# Patient Record
Sex: Male | Born: 1945 | Race: White | Hispanic: No | Marital: Married | Smoking: Former smoker
Health system: Southern US, Community
[De-identification: ages and names within clinical notes are randomized; demographics above are authoritative.]

## PROBLEM LIST (undated history)

## (undated) DIAGNOSIS — I255 Ischemic cardiomyopathy: Secondary | ICD-10-CM

## (undated) DIAGNOSIS — I219 Acute myocardial infarction, unspecified: Secondary | ICD-10-CM

## (undated) DIAGNOSIS — G9341 Metabolic encephalopathy: Secondary | ICD-10-CM

## (undated) DIAGNOSIS — S82899A Other fracture of unspecified lower leg, initial encounter for closed fracture: Secondary | ICD-10-CM

## (undated) DIAGNOSIS — E785 Hyperlipidemia, unspecified: Secondary | ICD-10-CM

## (undated) DIAGNOSIS — F0390 Unspecified dementia without behavioral disturbance: Secondary | ICD-10-CM

## (undated) DIAGNOSIS — I214 Non-ST elevation (NSTEMI) myocardial infarction: Secondary | ICD-10-CM

## (undated) DIAGNOSIS — I4729 Other ventricular tachycardia: Secondary | ICD-10-CM

## (undated) DIAGNOSIS — I2510 Atherosclerotic heart disease of native coronary artery without angina pectoris: Secondary | ICD-10-CM

## (undated) DIAGNOSIS — I472 Ventricular tachycardia: Secondary | ICD-10-CM

## (undated) DIAGNOSIS — H269 Unspecified cataract: Secondary | ICD-10-CM

## (undated) DIAGNOSIS — N200 Calculus of kidney: Secondary | ICD-10-CM

## (undated) DIAGNOSIS — I10 Essential (primary) hypertension: Secondary | ICD-10-CM

## (undated) HISTORY — PX: CATARACT EXTRACTION: SUR2

## (undated) HISTORY — PX: CARDIAC CATHETERIZATION: SHX172

## (undated) HISTORY — PX: VASECTOMY: SHX75

## (undated) HISTORY — PX: NASAL SEPTUM SURGERY: SHX37

## (undated) HISTORY — DX: Acute myocardial infarction, unspecified: I21.9

## (undated) HISTORY — DX: Hyperlipidemia, unspecified: E78.5

## (undated) HISTORY — DX: Other fracture of unspecified lower leg, initial encounter for closed fracture: S82.899A

## (undated) HISTORY — PX: ANKLE FRACTURE SURGERY: SHX122

## (undated) HISTORY — DX: Unspecified cataract: H26.9

## (undated) HISTORY — PX: CORONARY STENT PLACEMENT: SHX1402

---

## 2003-10-22 ENCOUNTER — Ambulatory Visit (HOSPITAL_COMMUNITY): Admission: RE | Admit: 2003-10-22 | Discharge: 2003-10-22 | Payer: Self-pay | Admitting: Internal Medicine

## 2012-10-08 ENCOUNTER — Encounter: Payer: Self-pay | Admitting: Gastroenterology

## 2012-11-05 ENCOUNTER — Encounter: Payer: Self-pay | Admitting: Gastroenterology

## 2012-11-05 ENCOUNTER — Telehealth: Payer: Self-pay | Admitting: *Deleted

## 2012-11-05 ENCOUNTER — Ambulatory Visit (AMBULATORY_SURGERY_CENTER): Payer: Medicare Other | Admitting: *Deleted

## 2012-11-05 VITALS — Ht 69.0 in | Wt 194.0 lb

## 2012-11-05 DIAGNOSIS — Z1211 Encounter for screening for malignant neoplasm of colon: Secondary | ICD-10-CM

## 2012-11-05 MED ORDER — NA SULFATE-K SULFATE-MG SULF 17.5-3.13-1.6 GM/177ML PO SOLN: ORAL | Status: DC

## 2012-11-05 NOTE — Progress Notes (Signed)
Patient states last colonoscopy was at [facility] by Dr.Robert Rourk in 2005. Release of information filled out and signed by patient. Given to [employer]. Phone noted sent also. Thanks!Robbin Myers,RN

## 2012-11-05 NOTE — Telephone Encounter (Signed)
Patient is for direct screening colonoscopy on 11-29-12 by Dr.Kaplan. Patient had last colonoscopy by Dr.Robert Rourk at [facility]. He said it was a "normal" exam. Patient said Dr.Donald Christell Constant has his records. Release of information filled out and signed and given to [employer].

## 2012-11-08 NOTE — Telephone Encounter (Signed)
Faxed release today 

## 2012-11-19 ENCOUNTER — Encounter: Payer: Self-pay | Admitting: Gastroenterology

## 2012-11-29 ENCOUNTER — Ambulatory Visit (AMBULATORY_SURGERY_CENTER): Payer: Medicare Other | Admitting: Gastroenterology

## 2012-11-29 ENCOUNTER — Encounter: Payer: Self-pay | Admitting: Gastroenterology

## 2012-11-29 VITALS — BP 135/71 | HR 61 | Temp 97.4°F | Resp 17 | Ht 69.0 in | Wt 194.0 lb

## 2012-11-29 DIAGNOSIS — Z1211 Encounter for screening for malignant neoplasm of colon: Secondary | ICD-10-CM

## 2012-11-29 MED ORDER — SODIUM CHLORIDE 0.9 % IV SOLN: 500.0000 mL | INTRAVENOUS | Status: DC

## 2012-11-29 NOTE — Op Note (Signed)
[facility] [address] [place], [address]   COLONOSCOPY PROCEDURE REPORT  PATIENT: Adam Rowland, Adam Rowland  MR#: [id number] BIRTHDATE: [birth year] , 67  yrs. old GENDER: Male ENDOSCOPIST: Louis Meckel, MD REFERRED OA:CZYS Sheron Nightingale, N.P. PROCEDURE DATE:  11/29/2012 PROCEDURE:   Colonoscopy, diagnostic ASA CLASS:   Class I INDICATIONS:average risk screening. MEDICATIONS: MAC sedation, administered by CRNA and propofol (Diprivan) 300mg  IV  DESCRIPTION OF PROCEDURE:   After the risks benefits and alternatives of the procedure were thoroughly explained, informed consent was obtained.  A digital rectal exam revealed no abnormalities of the rectum.   The LB [id number]  endoscope was introduced through the anus and advanced to the cecum, which was identified by both the appendix and ileocecal valve. No adverse events experienced.   The quality of the prep was Suprep good  The instrument was then slowly withdrawn as the colon was fully examined.      COLON FINDINGS: A normal appearing cecum, ileocecal valve, and appendiceal orifice were identified.  The ascending, hepatic flexure, transverse, splenic flexure, descending, sigmoid colon and rectum appeared unremarkable.  No polyps or cancers were seen. Retroflexed views revealed no abnormalities. The time to cecum=2 minutes 34 seconds.  Withdrawal time=6 minutes 14 seconds.  The scope was withdrawn and the procedure completed. COMPLICATIONS: There were no complications.  ENDOSCOPIC IMPRESSION: Normal colon  RECOMMENDATIONS: Continue current colorectal screening recommendations for "routine risk" patients with a repeat colonoscopy in 10 years.   eSigned:  Louis Meckel, MD 11/29/2012 8:49 AM   cc:

## 2012-11-29 NOTE — Patient Instructions (Addendum)
YOU HAD AN ENDOSCOPIC PROCEDURE TODAY AT THE [facility]: Refer to the procedure report that was given to you for any specific questions about what was found during the examination.  If the procedure report does not answer your questions, please call your gastroenterologist to clarify.  If you requested that your care partner not be given the details of your procedure findings, then the procedure report has been included in a sealed envelope for you to review at your convenience later.  YOU SHOULD EXPECT: Some feelings of bloating in the abdomen. Passage of more gas than usual.  Walking can help get rid of the air that was put into your GI tract during the procedure and reduce the bloating. If you had a lower endoscopy (such as a colonoscopy or flexible sigmoidoscopy) you may notice spotting of blood in your stool or on the toilet paper. If you underwent a bowel prep for your procedure, then you may not have a normal bowel movement for a few days.  DIET: Your first meal following the procedure should be a light meal and then it is ok to progress to your normal diet.  A half-sandwich or bowl of soup is an example of a good first meal.  Heavy or fried foods are harder to digest and may make you feel nauseous or bloated.  Likewise meals heavy in dairy and vegetables can cause extra gas to form and this can also increase the bloating.  Drink plenty of fluids but you should avoid alcoholic beverages for 24 hours.  ACTIVITY: Your care partner should take you home directly after the procedure.  You should plan to take it easy, moving slowly for the rest of the day.  You can resume normal activity the day after the procedure however you should NOT DRIVE or use heavy machinery for 24 hours (because of the sedation medicines used during the test).    SYMPTOMS TO REPORT IMMEDIATELY: A gastroenterologist can be reached at any hour.  During normal business hours, 8:30 AM to 5:00 PM Monday through Friday,  call [phone].  After hours and on weekends, please call the GI answering service at [phone] who will take a message and have the physician on call contact you.   Following lower endoscopy (colonoscopy or flexible sigmoidoscopy):  Excessive amounts of blood in the stool  Significant tenderness or worsening of abdominal pains  Swelling of the abdomen that is new, acute  Fever of 100F or higher    FOLLOW UP: If any biopsies were taken you will be contacted by phone or by letter within the next 1-3 weeks.  Call your gastroenterologist if you have not heard about the biopsies in 3 weeks.  Our staff will call the home number listed on your records the next business day following your procedure to check on you and address any questions or concerns that you may have at that time regarding the information given to you following your procedure. This is a courtesy call and so if there is no answer at the home number and we have not heard from you through the emergency physician on call, we will assume that you have returned to your regular daily activities without incident.  SIGNATURES/CONFIDENTIALITY: You and/or your care partner have signed paperwork which will be entered into your electronic medical record.  These signatures attest to the fact that that the information above on your After Visit Summary has been reviewed and is understood.  Full responsibility of the confidentiality   of this discharge information lies with you and/or your care-partner.     

## 2012-11-29 NOTE — Progress Notes (Signed)
Patient did not experience any of the following events: a burn prior to discharge; a fall within the facility; wrong site/side/patient/procedure/implant event; or a hospital transfer or hospital admission upon discharge from the facility. (G8907) Patient did not have preoperative order for IV antibiotic SSI prophylaxis. (G8918)  

## 2012-11-29 NOTE — Progress Notes (Signed)
To RR stable 

## 2012-11-30 ENCOUNTER — Telehealth: Payer: Self-pay | Admitting: *Deleted

## 2012-11-30 NOTE — Telephone Encounter (Signed)
  Follow up Call-  Call back number 11/29/2012  Post procedure Call Back phone  # [phone]  Permission to leave phone message Yes     Patient questions:  Do you have a fever, pain , or abdominal swelling? no Pain Score  0 *  Have you tolerated food without any problems? yes  Have you been able to return to your normal activities? yes  Do you have any questions about your discharge instructions: Diet   no Medications  no Follow up visit  no  Do you have questions or concerns about your Care? no  Actions: * If pain score is 4 or above: No action needed, pain <4.

## 2012-12-03 NOTE — Telephone Encounter (Signed)
Never received records... Procedure preformed

## 2012-12-12 ENCOUNTER — Ambulatory Visit (INDEPENDENT_AMBULATORY_CARE_PROVIDER_SITE_OTHER): Payer: Medicare Other | Admitting: *Deleted

## 2012-12-12 DIAGNOSIS — Z23 Encounter for immunization: Secondary | ICD-10-CM

## 2012-12-12 NOTE — Patient Instructions (Signed)
Tetanus, Diphtheria, Pertussis (Tdap) Vaccine What You Need to Know WHY GET VACCINATED? Tetanus, diphtheria and pertussis can be very serious diseases, even for adolescents and adults. Tdap vaccine can protect us from these diseases. TETANUS (Lockjaw) causes painful muscle tightening and stiffness, usually all over the body.  It can lead to tightening of muscles in the head and neck so you can't open your mouth, swallow, or sometimes even breathe. Tetanus kills about 1 out of 5 people who are infected. DIPHTHERIA can cause a thick coating to form in the back of the throat.  It can lead to breathing problems, paralysis, heart failure, and death. PERTUSSIS (Whooping Cough) causes severe coughing spells, which can cause difficulty breathing, vomiting and disturbed sleep.  It can also lead to weight loss, incontinence, and rib fractures. Up to 2 in 100 adolescents and 5 in 100 adults with pertussis are hospitalized or have complications, which could include pneumonia and death. These diseases are caused by bacteria. Diphtheria and pertussis are spread from person to person through coughing or sneezing. Tetanus enters the body through cuts, scratches, or wounds. Before vaccines, the [place] saw as many as 200,000 cases a year of diphtheria and pertussis, and hundreds of cases of tetanus. Since vaccination began, tetanus and diphtheria have dropped by about 99% and pertussis by about 80%. TDAP VACCINE Tdap vaccine can protect adolescents and adults from tetanus, diphtheria, and pertussis. One dose of Tdap is routinely given at age 11 or 12. People who did not get Tdap at that age should get it as soon as possible. Tdap is especially important for health care professionals and anyone having close contact with a baby younger than 12 months. Pregnant women should get a dose of Tdap during every pregnancy, to protect the newborn from pertussis. Infants are most at risk for severe, life-threatening  complications from pertussis. A similar vaccine, called Td, protects from tetanus and diphtheria, but not pertussis. A Td booster should be given every 10 years. Tdap may be given as one of these boosters if you have not already gotten a dose. Tdap may also be given after a severe cut or burn to prevent tetanus infection. Your doctor can give you more information. Tdap may safely be given at the same time as other vaccines. SOME PEOPLE SHOULD NOT GET THIS VACCINE  If you ever had a life-threatening allergic reaction after a dose of any tetanus, diphtheria, or pertussis containing vaccine, OR if you have a severe allergy to any part of this vaccine, you should not get Tdap. Tell your doctor if you have any severe allergies.  If you had a coma, or long or multiple seizures within 7 days after a childhood dose of DTP or DTaP, you should not get Tdap, unless a cause other than the vaccine was found. You can still get Td.  Talk to your doctor if you:  have epilepsy or another nervous system problem,  had severe pain or swelling after any vaccine containing diphtheria, tetanus or pertussis,  ever had Guillain-Barr Syndrome (GBS),  aren't feeling well on the day the shot is scheduled. RISKS OF A VACCINE REACTION With any medicine, including vaccines, there is a chance of side effects. These are usually mild and go away on their own, but serious reactions are also possible. Brief fainting spells can follow a vaccination, leading to injuries from falling. Sitting or lying down for about 15 minutes can help prevent these. Tell your doctor if you feel dizzy or light-headed, or   have vision changes or ringing in the ears. Mild problems following Tdap (Did not interfere with activities)  Pain where the shot was given (about 3 in 4 adolescents or 2 in 3 adults)  Redness or swelling where the shot was given (about 1 person in 5)  Mild fever of at least 100.16F (up to about 1 in 25 adolescents or 1 in  100 adults)  Headache (about 3 or 4 people in 10)  Tiredness (about 1 person in 3 or 4)  Nausea, vomiting, diarrhea, stomach ache (up to 1 in 4 adolescents or 1 in 10 adults)  Chills, body aches, sore joints, rash, swollen glands (uncommon) Moderate problems following Tdap (Interfered with activities, but did not require medical attention)  Pain where the shot was given (about 1 in 5 adolescents or 1 in 100 adults)  Redness or swelling where the shot was given (up to about 1 in 16 adolescents or 1 in 25 adults)  Fever over 102F (about 1 in 100 adolescents or 1 in 250 adults)  Headache (about 3 in 20 adolescents or 1 in 10 adults)  Nausea, vomiting, diarrhea, stomach ache (up to 1 or 3 people in 100)  Swelling of the entire arm where the shot was given (up to about 3 in 100). Severe problems following Tdap (Unable to perform usual activities, required medical attention)  Swelling, severe pain, bleeding and redness in the arm where the shot was given (rare). A severe allergic reaction could occur after any vaccine (estimated less than 1 in a million doses). WHAT IF THERE IS A SERIOUS REACTION? What should I look for?  Look for anything that concerns you, such as signs of a severe allergic reaction, very high fever, or behavior changes. Signs of a severe allergic reaction can include hives, swelling of the face and throat, difficulty breathing, a fast heartbeat, dizziness, and weakness. These would start a few minutes to a few hours after the vaccination. What should I do?  If you think it is a severe allergic reaction or other emergency that can't wait, call 9-1-1 or get the person to the nearest hospital. Otherwise, call your doctor.  Afterward, the reaction should be reported to the "Vaccine Adverse Event Reporting System" (VAERS). Your doctor might file this report, or you can do it yourself through the VAERS web site at www.vaers.LAgents.no, or by calling 1-747-061-3494. VAERS is  only for reporting reactions. They do not give medical advice.  THE NATIONAL VACCINE INJURY COMPENSATION PROGRAM The National Vaccine Injury Compensation Program (VICP) is a federal program that was created to compensate people who may have been injured by certain vaccines. Persons who believe they may have been injured by a vaccine can learn about the program and about filing a claim by calling 1-(878) 582-8087 or visiting the VICP website at SpiritualWord.at. HOW CAN I LEARN MORE?  Ask your doctor.  Call your local or state health department.  Contact the Centers for Disease Control and Prevention (CDC):  Call 8620531230 or visit CDC's website at PicCapture.uy CDC Tdap Vaccine VIS (01/05/12) Document Released: 02/14/2012 Document Reviewed: 02/14/2012 [facility] Patient Information 2013 [place], [place]. Pneumococcal Polysaccharide Vaccine What You Need to Know PNEUMOCOCCAL DISEASE Pneumococcal disease is caused by Streptococcus pneumoniae bacteria. It is a leading cause of vaccine-preventable illness and death in the [place]. Anyone can get pneumococcal disease, but some people are at greater risk than others:  People 93 and older.  The very young.  People with certain health problems.  People with  a weakened immune system.  Smokers. Pneumococcal disease can lead to serious infections of the:  Lungs (pneumonia).  Blood (bacteremia).  Covering of the brain (meningitis). Pneumococcal pneumonia kills about 1 out of 20 people who get it. Bacteremia kills about 1 person in 5, and meningitis about 3 people in 10.  PNEUMOCOCCAL POLYSACCHARIDE VACCINE (PPSV) Treatment of pneumococcal infections with penicillin and other drugs used to be more effective. However, some strains of the disease have become resistant to these drugs. This makes prevention of the disease, through vaccination, even more important. Pneumococcal polysaccharide vaccine (PPSV) protects  against 23 types of pneumococcal bacteria, including those most likely to cause serious disease. Most healthy adults who get the vaccine develop protection to most or all of these types within 2 to 3 weeks of getting the shot. Very old people, children under 32 years of age, and people with some long-term illnesses might not respond as well, or at all. Another type of pneumococcal vaccine (pneumococcal conjugate vaccine, or PCV) is routinely recommended for children younger than 90 years of age.  WHO SHOULD GET PPSV?  All adults 37 years of age or older.  Anyone 2 through 66 years of age who has a long-term health problem, such as:  Heart disease.  Lung disease.  Sickle cell disease.  Diabetes.  Alcoholism.  Cirrhosis.  Leaks of cerebrospinal fluid.  Cochlear implant.  Anyone 2 through 66 years of age who has a disease or condition that lowers the body's resistance to infection, such as:  Hodgkin's disease.  Lymphoma or leukemia.  Kidney failure.  Multiple myeloma.  Nephrotic syndrome.  HIV infection or AIDS.  Damaged spleen or no spleen.  Organ transplant.  Anyone 2 through 66 years of age who is taking a drug or treatment that lowers the body's resistance to infection, such as:  Long-term steroids.  Certain cancer drugs.  Radiation therapy.  Any adult 71 through 66 years of age who:  Is a smoker.  Has asthma. PPSV may be less effective for some people, especially those with lower resistance to infection. However, these people should still be vaccinated because they are more likely to have serious complications if they get pneumococcal disease. Children who often get ear infections, sinus infections, or other upper respiratory diseases, but who are otherwise healthy, do not need to get PPSV because it is not effective against those conditions. HOW MANY DOSES OF PPSV ARE NEEDED, AND WHEN? Usually only 1 dose of PPSV is needed, but under some circumstances a  second dose may be given.  A second dose is recommended for people age 64 and older who got their first dose when they were younger than 80 if 5 or more years have passed since the first dose.  A second dose is recommended for people 2 through 66 years of age who:  Have a damaged spleen or no spleen.  Have sickle cell disease.  Have HIV infection or AIDS.  Have cancer, leukemia, lymphoma, or multiple myeloma.  Have nephrotic syndrome.  Have had an organ or bone marrow transplant.  Are taking medicine that lowers immunity (such as chemotherapy or long-term steroids). When a second dose is given, it should be given 5 years after the first dose. SOME PEOPLE SHOULD NOT GET PPSV OR SHOULD WAIT  Anyone who has had a life-threatening allergic reaction to PPSV should not get another dose.  Anyone who has a severe allergy to any component of a vaccine should not get that vaccine. Tell  your caregiver if you have any severe allergies.  Anyone who is moderately or severely ill when the shot is scheduled may be asked to wait until they recover before getting the vaccine. Someone with a mild illness can usually be vaccinated.  While there is no evidence that PPSV is harmful to either a pregnant woman or to her fetus, as a precaution, women with conditions that put them at risk for pneumococcal disease should be vaccinated before becoming pregnant, if possible. WHAT ARE THE RISKS FROM PPSV?  About half of the people who get PPSV have mild side effects, such as redness or pain where the shot is given.  Less than 1% develop a fever, muscle aches, or more severe local reactions.  A vaccine, like any medicine, can cause a serious reaction. However, the risk of a vaccine causing serious harm, or death, is extremely small. WHAT IF THERE IS A SEVERE REACTION? What should I look for? Any unusual condition, such as a high fever or behavior changes. Signs of a severe allergic reaction can include  difficulty breathing, hoarseness or wheezing, hives, paleness, weakness, a fast heartbeat, or dizziness. What should I do?  Call a caregiver, or get the person to a caregiver right away.  Tell your caregiver what happened, the date and time it happened, and when the vaccination was given.  Ask your caregiver to report the reaction by filing a Vaccine Adverse Event Reporting System (VAERS) form. Or, you can file this report through the VAERS website at www.vaers.LAgents.no or by calling 1-(601)180-8129. VAERS does not provide medical advice. HOW CAN I LEARN MORE?  Ask your caregiver. He or she can give you the vaccine package insert or suggest other sources of information.  Call your local or state health department.  Contact the Centers for Disease Control and Prevention (CDC):  Call 802 123 6302 (1-800-CDC-INFO).  Visit the CDC website at PicCapture.uy CDC Pneumococcal Polysaccharide Vaccine VIS (06/03/08) Document Released: 06/12/2006 Document Revised: 11/07/2011 Document Reviewed: 06/03/2008 [facility] Patient Information 2013 [place], [place].

## 2013-01-16 ENCOUNTER — Encounter: Payer: Self-pay | Admitting: Nurse Practitioner

## 2013-01-16 ENCOUNTER — Ambulatory Visit (INDEPENDENT_AMBULATORY_CARE_PROVIDER_SITE_OTHER): Payer: Medicare Other | Admitting: Nurse Practitioner

## 2013-01-16 ENCOUNTER — Other Ambulatory Visit: Payer: Medicare Other

## 2013-01-16 VITALS — BP 152/88 | HR 61 | Temp 97.4°F | Ht 69.0 in | Wt 184.0 lb

## 2013-01-16 DIAGNOSIS — N200 Calculus of kidney: Secondary | ICD-10-CM

## 2013-01-16 DIAGNOSIS — I10 Essential (primary) hypertension: Secondary | ICD-10-CM

## 2013-01-16 DIAGNOSIS — E785 Hyperlipidemia, unspecified: Secondary | ICD-10-CM

## 2013-01-16 HISTORY — DX: Calculus of kidney: N20.0

## 2013-01-16 LAB — COMPLETE METABOLIC PANEL WITH GFR
Albumin: 4.5 g/dL (ref 3.5–5.2)
Alkaline Phosphatase: 45 U/L (ref 39–117)
BUN: 12 mg/dL (ref 6–23)
CO2: 26 mEq/L (ref 19–32)
GFR, Est African American: >89 mL/min
GFR, Est Non African American: >89 mL/min
Glucose, Bld: 120 mg/dL — ABNORMAL HIGH (ref 70–99)
Total Bilirubin: 1 mg/dL (ref 0.3–1.2)

## 2013-01-16 MED ORDER — LISINOPRIL 20 MG PO TABS: 20.0000 mg | ORAL_TABLET | Freq: Every day | ORAL | Status: DC

## 2013-01-16 NOTE — Patient Instructions (Signed)

## 2013-01-16 NOTE — Progress Notes (Signed)
  Subjective:    Patient ID: Adam Rowland, male    DOB: 10/28/1945, 67 y.o.   MRN: [id number]  Hyperlipidemia This is a chronic problem. The current episode started more than 1 year ago. The problem is uncontrolled. Recent lipid tests were reviewed and are high. Exacerbating diseases include hypothyroidism and obesity. There are no known factors aggravating his hyperlipidemia. Pertinent negatives include no chest pain, focal sensory loss, leg pain, myalgias or shortness of breath. Current antihyperlipidemic treatment includes diet change. Improvement on treatment: will see if diet has helped when we bet labs  back. Compliance problems include adherence to exercise.  Risk factors for coronary artery disease include male sex.  Hypertension Blood pressure high today- Was high the last 3 times he was here. Wanted to try diet and exercise before going on meds. Has done well on diet and has actually lost 12lbs since last visit and has made no change in Blood Pressure.    Review of Systems  Constitutional: Positive for diaphoresis. Negative for fatigue.  HENT: Negative.   Eyes: Negative.   Respiratory: Negative for chest tightness and shortness of breath.   Cardiovascular: Negative for chest pain and palpitations.  Endocrine: Negative.   Genitourinary: Negative.   Musculoskeletal: Negative.  Negative for myalgias.  Neurological: Negative.   Hematological: Negative.   Psychiatric/Behavioral: Negative.        Objective:   Physical Exam  Constitutional: He is oriented to person, place, and time. He appears well-developed and well-nourished.  HENT:  Head: Normocephalic.  Right Ear: External ear normal.  Left Ear: External ear normal.  Nose: Nose normal.  Mouth/Throat: Oropharynx is clear and moist.  Eyes: EOM are normal. Pupils are equal, round, and reactive to light.  Neck: Normal range of motion. Neck supple. No thyromegaly present.  Cardiovascular: Normal rate, regular rhythm, normal heart  sounds and intact distal pulses.   No murmur heard. Pulmonary/Chest: Effort normal and breath sounds normal. He has no wheezes. He has no rales.  Abdominal: Soft. Bowel sounds are normal.  Genitourinary:  Rectal exam done at last visit.  Musculoskeletal: Normal range of motion.  Neurological: He is alert and oriented to person, place, and time.  Skin: Skin is warm and dry.  Psychiatric: He has a normal mood and affect. His behavior is normal. Judgment and thought content normal.   BP 152/88  Pulse 61  Temp(Src) 97.4 F (36.3 C) (Oral)  Ht 5\' 9"  (1.753 m)  Wt 184 lb (83.462 kg)  BMI 27.16 kg/m2        Assessment & Plan:  1. Other and unspecified hyperlipidemia Continue low fat diet an dexercsie - COMPLETE METABOLIC PANEL WITH GFR - NMR Lipoprofile with Lipids  2. Nephrolithiasis Force fluids Decrease caffeine consumption  3. Hypertension Low Na+ diet Meds ordered this encounter  Medications  . lisinopril (PRINIVIL,ZESTRIL) 20 MG tablet    Sig: Take 1 tablet (20 mg total) by mouth daily.    Dispense:  30 tablet    Refill:  5    Order Specific Question:  Supervising Provider    Answer:  Ernestina Penna [[id number]]   [name] Daphine Deutscher, FNP

## 2013-01-18 LAB — NMR LIPOPROFILE WITH LIPIDS
HDL Size: 8 nm — ABNORMAL LOW (ref >=9.2)
LDL Particle Number: 1999 nmol/L — ABNORMAL HIGH (ref <1000)
LDL Size: 19.9 nm — ABNORMAL LOW (ref >20.5)
Large VLDL-P: 1.5 nmol/L (ref <=2.7)
Triglycerides: 176 mg/dL — ABNORMAL HIGH (ref <150)
VLDL Size: 47 nm — ABNORMAL HIGH (ref <=46.6)

## 2013-01-22 ENCOUNTER — Other Ambulatory Visit: Payer: Self-pay | Admitting: Nurse Practitioner

## 2013-01-22 MED ORDER — ATORVASTATIN CALCIUM 40 MG PO TABS: 40.0000 mg | ORAL_TABLET | Freq: Every day | ORAL | Status: DC

## 2013-01-28 NOTE — Progress Notes (Signed)
Lmom.  Call us please.

## 2013-01-29 ENCOUNTER — Telehealth: Payer: Self-pay | Admitting: Family Medicine

## 2013-07-04 ENCOUNTER — Other Ambulatory Visit: Payer: Self-pay

## 2013-08-02 ENCOUNTER — Other Ambulatory Visit: Payer: Self-pay | Admitting: Nurse Practitioner

## 2013-08-05 NOTE — Telephone Encounter (Signed)
Last seen 01/16/13  MMM   

## 2013-08-29 DIAGNOSIS — I219 Acute myocardial infarction, unspecified: Secondary | ICD-10-CM

## 2013-08-29 HISTORY — DX: Acute myocardial infarction, unspecified: I21.9

## 2013-09-09 ENCOUNTER — Other Ambulatory Visit: Payer: Self-pay | Admitting: Nurse Practitioner

## 2013-09-11 NOTE — Telephone Encounter (Signed)
ntbs for future refills of blood pressure meds

## 2013-09-11 NOTE — Telephone Encounter (Signed)
Last seen 01/16/13  MMM

## 2013-10-14 ENCOUNTER — Other Ambulatory Visit: Payer: Self-pay | Admitting: Nurse Practitioner

## 2013-10-16 NOTE — Telephone Encounter (Signed)
Last seen 01/16/13

## 2013-10-17 NOTE — Telephone Encounter (Signed)
Refill sent to pharmacy- NTBS

## 2013-11-19 ENCOUNTER — Other Ambulatory Visit: Payer: Self-pay | Admitting: Nurse Practitioner

## 2013-12-26 ENCOUNTER — Other Ambulatory Visit: Payer: Self-pay | Admitting: Nurse Practitioner

## 2013-12-27 NOTE — Telephone Encounter (Signed)
Patient notified at last refill that NTBS. Please advise. Last visit was 01-16-13

## 2013-12-28 NOTE — Telephone Encounter (Signed)
NO MORE REFILLS without being seeen

## 2013-12-28 NOTE — Telephone Encounter (Signed)
Detailed message left that he would need to be seen for any further refills

## 2013-12-30 ENCOUNTER — Other Ambulatory Visit: Payer: Self-pay | Admitting: *Deleted

## 2013-12-30 MED ORDER — LISINOPRIL 20 MG PO TABS: ORAL_TABLET | ORAL | Status: DC

## 2014-01-13 ENCOUNTER — Telehealth: Payer: Self-pay | Admitting: *Deleted

## 2014-01-13 NOTE — Telephone Encounter (Signed)
Pt called stating he had a TCS 13 months ago, [facility] scheduled him, pt had his TCS done by Melvia Heaps. Please advise [phone]

## 2014-01-13 NOTE — Telephone Encounter (Signed)
Tried to call but no answer

## 2014-01-16 ENCOUNTER — Encounter: Payer: Self-pay | Admitting: Nurse Practitioner

## 2014-01-16 ENCOUNTER — Ambulatory Visit (INDEPENDENT_AMBULATORY_CARE_PROVIDER_SITE_OTHER): Payer: Medicare Other | Admitting: Nurse Practitioner

## 2014-01-16 VITALS — BP 133/72 | HR 59 | Temp 98.2°F | Ht 69.0 in | Wt 184.0 lb

## 2014-01-16 DIAGNOSIS — I10 Essential (primary) hypertension: Secondary | ICD-10-CM

## 2014-01-16 DIAGNOSIS — Z125 Encounter for screening for malignant neoplasm of prostate: Secondary | ICD-10-CM

## 2014-01-16 DIAGNOSIS — E785 Hyperlipidemia, unspecified: Secondary | ICD-10-CM

## 2014-01-16 DIAGNOSIS — Z0000 Encounter for general adult medical examination without abnormal findings: Secondary | ICD-10-CM

## 2014-01-16 LAB — POCT CBC
GRANULOCYTE PERCENT: 62.5 % (ref 37–80)
HCT, POC: 45.6 % (ref 43.5–53.7)
Hemoglobin: 15.1 g/dL (ref 14.1–18.1)
LYMPH, POC: 1.9 (ref 0.6–3.4)
MCH, POC: 32.3 pg — AB (ref 27–31.2)
MCHC: 33.1 g/dL (ref 31.8–35.4)
MCV: 97.9 fL — AB (ref 80–97)
MPV: 6.4 fL (ref 0–99.8)
PLATELET COUNT, POC: 270 10*3/uL (ref 142–424)
POC GRANULOCYTE: 3.7 (ref 2–6.9)
POC LYMPH PERCENT: 32.1 %L (ref 10–50)
RBC: 4.7 M/uL (ref 4.69–6.13)
RDW, POC: 12.2 %
WBC: 5.9 10*3/uL (ref 4.6–10.2)

## 2014-01-16 MED ORDER — LISINOPRIL 20 MG PO TABS: ORAL_TABLET | ORAL | Status: DC

## 2014-01-16 MED ORDER — ATORVASTATIN CALCIUM 40 MG PO TABS: 40.0000 mg | ORAL_TABLET | Freq: Every day | ORAL | Status: DC

## 2014-01-16 NOTE — Progress Notes (Signed)
Subjective:    Patient ID: Adam Rowland, male    DOB: [birth year], 68 y.o.   MRN: [id number]  [name] in today for CPE.  Hyperlipidemia This is a chronic problem. The current episode started more than 1 year ago. The problem is uncontrolled. Recent lipid tests were reviewed and are high. Exacerbating diseases include hypothyroidism and obesity. There are no known factors aggravating his hyperlipidemia. Pertinent negatives include no chest pain, focal sensory loss, leg pain, myalgias or shortness of breath. Current antihyperlipidemic treatment includes diet change. Improvement on treatment: will see if diet has helped when we bet labs  back. Compliance problems include adherence to exercise.  Risk factors for coronary artery disease include male sex.  Hypertension Blood pressure high today- Was high the last 3 times he was here. Wanted to try diet and exercise before going on meds. Has done well on diet and has actually lost 12lbs since last visit and has made no change in Blood Pressure.  * Patient injured his shoulder shoveling snow - doesn't want to do anything about it right now just wanted it noted.  Review of Systems  Constitutional: Positive for diaphoresis. Negative for fatigue.  HENT: Negative.   Eyes: Negative.   Respiratory: Negative for chest tightness and shortness of breath.   Cardiovascular: Negative for chest pain and palpitations.  Endocrine: Negative.   Genitourinary: Negative.   Musculoskeletal: Negative.  Negative for myalgias.  Neurological: Negative.   Hematological: Negative.   Psychiatric/Behavioral: Negative.        Objective:   Physical Exam  Constitutional: He is oriented to person, place, and time. He appears well-developed and well-nourished.  HENT:  Head: Normocephalic.  Right Ear: External ear normal.  Left Ear: External ear normal.  Nose: Nose normal.  Mouth/Throat: Oropharynx is clear and moist.  Eyes: EOM are normal. Pupils are equal, round, and  reactive to light.  Neck: Normal range of motion. Neck supple. No thyromegaly present.  Cardiovascular: Normal rate, regular rhythm, normal heart sounds and intact distal pulses.   No murmur heard. Pulmonary/Chest: Effort normal and breath sounds normal. He has no wheezes. He has no rales.  Abdominal: Soft. Bowel sounds are normal.  Genitourinary: Rectum normal, prostate normal and penis normal. Guaiac negative stool. No penile tenderness.  Musculoskeletal: Normal range of motion.  FROM of right shoulder without pain Sensation and strenghth intact distally Grips equal bil.  Neurological: He is alert and oriented to person, place, and time.  Skin: Skin is warm and dry.  Psychiatric: He has a normal mood and affect. His behavior is normal. Judgment and thought content normal.   BP 133/72  Pulse 59  Temp(Src) 98.2 F (36.8 C) (Oral)  Ht 5' 9"  (1.753 m)  Wt 184 lb (83.462 kg)  BMI 27.16 kg/m2        Assessment & Plan:   1. Annual physical exam   2. Hyperlipidemia   3. Prostate cancer screening   4. Hypertension    Orders Placed This Encounter  Procedures  . CMP14+EGFR  . NMR, lipoprofile  . Thyroid Panel With TSH  . PSA, total and free  . POCT CBC   Meds ordered this encounter  Medications  . atorvastatin (LIPITOR) 40 MG tablet    Sig: Take 1 tablet (40 mg total) by mouth daily.    Dispense:  90 tablet    Refill:  3    Order Specific Question:  Supervising Provider    Answer:  Chipper Herb [[id number]]  .  lisinopril (PRINIVIL,ZESTRIL) 20 MG tablet    Sig: TAKE 1 TABLET DAILY    Dispense:  30 tablet    Refill:  3    Order Specific Question:  Supervising Provider    Answer:  Chipper Herb [[id number]]    Labs pending Health maintenance reviewed Diet and exercise encouraged Continue all meds Follow up  In 6 month   [place], FNP

## 2014-01-16 NOTE — Patient Instructions (Signed)

## 2014-01-17 LAB — NMR, LIPOPROFILE
Cholesterol: 137 mg/dL (ref 100–199)
HDL CHOLESTEROL BY NMR: 35 mg/dL — AB (ref >39)
HDL PARTICLE NUMBER: 27 umol/L — AB (ref >=30.5)
LDL Particle Number: 1142 nmol/L — ABNORMAL HIGH (ref <1000)
LDL Size: 20.3 nm (ref >20.5)
LDLC SERPL CALC-MCNC: 83 mg/dL (ref 0–99)
LP-IR SCORE: 45 (ref <=45)
SMALL LDL PARTICLE NUMBER: 628 nmol/L — AB (ref <=527)
Triglycerides by NMR: 96 mg/dL (ref 0–149)

## 2014-01-17 LAB — CMP14+EGFR
ALT: 24 IU/L (ref 0–44)
AST: 23 IU/L (ref 0–40)
Albumin/Globulin Ratio: 2 (ref 1.1–2.5)
Albumin: 4.5 g/dL (ref 3.6–4.8)
Alkaline Phosphatase: 54 IU/L (ref 39–117)
BUN / CREAT RATIO: 16 (ref 10–22)
BUN: 15 mg/dL (ref 8–27)
CALCIUM: 9.3 mg/dL (ref 8.6–10.2)
CO2: 24 mmol/L (ref 18–29)
Chloride: 100 mmol/L (ref 97–108)
Creatinine, Ser: 0.94 mg/dL (ref 0.76–1.27)
GFR calc Af Amer: 97 mL/min/{1.73_m2} (ref >59)
GFR calc non Af Amer: 84 mL/min/{1.73_m2} (ref >59)
Globulin, Total: 2.2 g/dL (ref 1.5–4.5)
Glucose: 107 mg/dL — ABNORMAL HIGH (ref 65–99)
POTASSIUM: 4.6 mmol/L (ref 3.5–5.2)
SODIUM: 139 mmol/L (ref 134–144)
Total Bilirubin: 0.8 mg/dL (ref 0.0–1.2)
Total Protein: 6.7 g/dL (ref 6.0–8.5)

## 2014-01-17 LAB — THYROID PANEL WITH TSH
FREE THYROXINE INDEX: 1.4 (ref 1.2–4.9)
T3 Uptake Ratio: 26 % (ref 24–39)
T4, Total: 5.3 ug/dL (ref 4.5–12.0)
TSH: 1.74 u[IU]/mL (ref 0.450–4.500)

## 2014-01-17 LAB — PSA, TOTAL AND FREE
PSA, Free Pct: 52.2 %
PSA, Free: 0.47 ng/mL
PSA: 0.9 ng/mL (ref 0.0–4.0)

## 2014-06-12 ENCOUNTER — Emergency Department (HOSPITAL_COMMUNITY): Payer: Medicare Other

## 2014-06-12 ENCOUNTER — Ambulatory Visit (INDEPENDENT_AMBULATORY_CARE_PROVIDER_SITE_OTHER): Payer: Medicare Other | Admitting: Family

## 2014-06-12 ENCOUNTER — Emergency Department (HOSPITAL_COMMUNITY)
Admission: EM | Admit: 2014-06-12 | Discharge: 2014-06-12 | Disposition: A | Discharge status: Home / Self Care | Payer: Medicare Other | Attending: Emergency Medicine | Admitting: Emergency Medicine

## 2014-06-12 DIAGNOSIS — Z87442 Personal history of urinary calculi: Secondary | ICD-10-CM | POA: Diagnosis not present

## 2014-06-12 DIAGNOSIS — Z79899 Other long term (current) drug therapy: Secondary | ICD-10-CM | POA: Diagnosis not present

## 2014-06-12 DIAGNOSIS — E785 Hyperlipidemia, unspecified: Secondary | ICD-10-CM | POA: Diagnosis not present

## 2014-06-12 DIAGNOSIS — S82842A Displaced bimalleolar fracture of left lower leg, initial encounter for closed fracture: Secondary | ICD-10-CM | POA: Insufficient documentation

## 2014-06-12 DIAGNOSIS — S50312A Abrasion of left elbow, initial encounter: Secondary | ICD-10-CM | POA: Diagnosis not present

## 2014-06-12 DIAGNOSIS — Y9289 Other specified places as the place of occurrence of the external cause: Secondary | ICD-10-CM | POA: Diagnosis not present

## 2014-06-12 DIAGNOSIS — S82892A Other fracture of left lower leg, initial encounter for closed fracture: Secondary | ICD-10-CM

## 2014-06-12 DIAGNOSIS — R42 Dizziness and giddiness: Secondary | ICD-10-CM | POA: Diagnosis not present

## 2014-06-12 DIAGNOSIS — Y9389 Activity, other specified: Secondary | ICD-10-CM | POA: Diagnosis not present

## 2014-06-12 DIAGNOSIS — Z87891 Personal history of nicotine dependence: Secondary | ICD-10-CM | POA: Diagnosis not present

## 2014-06-12 DIAGNOSIS — S99912A Unspecified injury of left ankle, initial encounter: Secondary | ICD-10-CM | POA: Diagnosis present

## 2014-06-12 DIAGNOSIS — W19XXXA Unspecified fall, initial encounter: Secondary | ICD-10-CM

## 2014-06-12 DIAGNOSIS — W11XXXA Fall on and from ladder, initial encounter: Secondary | ICD-10-CM | POA: Insufficient documentation

## 2014-06-12 DIAGNOSIS — I959 Hypotension, unspecified: Secondary | ICD-10-CM

## 2014-06-12 DIAGNOSIS — S82899A Other fracture of unspecified lower leg, initial encounter for closed fracture: Secondary | ICD-10-CM

## 2014-06-12 HISTORY — DX: Other fracture of unspecified lower leg, initial encounter for closed fracture: S82.899A

## 2014-06-12 LAB — COMPREHENSIVE METABOLIC PANEL
ALT: 33 U/L (ref 0–53)
AST: 29 U/L (ref 0–37)
Albumin: 4.2 g/dL (ref 3.5–5.2)
Alkaline Phosphatase: 48 U/L (ref 39–117)
Anion gap: 13 (ref 5–15)
BUN: 14 mg/dL (ref 6–23)
CALCIUM: 9.1 mg/dL (ref 8.4–10.5)
CO2: 24 meq/L (ref 19–32)
CREATININE: 0.81 mg/dL (ref 0.50–1.35)
Chloride: 102 mEq/L (ref 96–112)
GFR, EST NON AFRICAN AMERICAN: 90 mL/min — AB (ref >90)
GLUCOSE: 116 mg/dL — AB (ref 70–99)
Potassium: 4.5 mEq/L (ref 3.7–5.3)
Sodium: 139 mEq/L (ref 137–147)
Total Bilirubin: 0.7 mg/dL (ref 0.3–1.2)
Total Protein: 7.4 g/dL (ref 6.0–8.3)

## 2014-06-12 LAB — I-STAT TROPONIN, ED: TROPONIN I, POC: 0.01 ng/mL (ref 0.00–0.08)

## 2014-06-12 LAB — CBC WITH DIFFERENTIAL/PLATELET
Basophils Absolute: 0 10*3/uL (ref 0.0–0.1)
Basophils Relative: 0 % (ref 0–1)
EOS PCT: 1 % (ref 0–5)
Eosinophils Absolute: 0.1 10*3/uL (ref 0.0–0.7)
HCT: 43.2 % (ref 39.0–52.0)
Hemoglobin: 15.3 g/dL (ref 13.0–17.0)
LYMPHS ABS: 1 10*3/uL (ref 0.7–4.0)
LYMPHS PCT: 9 % — AB (ref 12–46)
MCH: 33.3 pg (ref 26.0–34.0)
MCHC: 35.4 g/dL (ref 30.0–36.0)
MCV: 93.9 fL (ref 78.0–100.0)
Monocytes Absolute: 0.5 10*3/uL (ref 0.1–1.0)
Monocytes Relative: 5 % (ref 3–12)
Neutro Abs: 9.2 10*3/uL — ABNORMAL HIGH (ref 1.7–7.7)
Neutrophils Relative %: 85 % — ABNORMAL HIGH (ref 43–77)
Platelets: 210 10*3/uL (ref 150–400)
RBC: 4.6 MIL/uL (ref 4.22–5.81)
RDW: 12.3 % (ref 11.5–15.5)
WBC: 10.9 10*3/uL — AB (ref 4.0–10.5)

## 2014-06-12 MED ORDER — OXYCODONE-ACETAMINOPHEN 5-325 MG PO TABS: 2.0000 | ORAL_TABLET | Freq: Four times a day (QID) | ORAL | Status: DC | PRN

## 2014-06-12 MED ORDER — MORPHINE SULFATE 4 MG/ML IJ SOLN
4.0000 mg | Freq: Once | INTRAMUSCULAR | Status: AC
  Administered 2014-06-12: 4 mg via INTRAVENOUS
  Filled 2014-06-12: qty 1

## 2014-06-12 MED ORDER — SODIUM CHLORIDE 0.9 % IV BOLUS (SEPSIS)
1000.0000 mL | Freq: Once | INTRAVENOUS | Status: AC
  Administered 2014-06-12: 1000 mL via INTRAVENOUS

## 2014-06-12 MED ORDER — ONDANSETRON HCL 4 MG/2ML IJ SOLN
4.0000 mg | Freq: Once | INTRAMUSCULAR | Status: AC
  Administered 2014-06-12: 4 mg via INTRAVENOUS
  Filled 2014-06-12: qty 2

## 2014-06-12 NOTE — ED Notes (Signed)
Per EMS: Pt was coming off 8 ft ladder when he fell 4 ft, denies any syncope or head trauma, left ankle deformity noted and abrasion noted to left elbow. Bruising also noted to left side of chest where pt fell against the ladder. EMS noted pt had syncopal episode when he went to pcp after fall. nad noted. Pt axo x4.

## 2014-06-12 NOTE — ED Notes (Signed)
Patient transported to X-ray 

## 2014-06-12 NOTE — Patient Instructions (Signed)
Ankle Fracture  A fracture is a break in a bone. The ankle joint is made up of three bones. These include the lower (distal)sections of your lower leg bones, called the tibia and fibula, along with a bone in your foot, called the talus. Depending on how bad the break is and if more than one ankle joint bone is broken, a cast or splint is used to protect and keep your injured bone from moving while it heals. Sometimes, surgery is required to help the fracture heal properly.   There are two general types of fractures:   Stable fracture. This includes a single fracture line through one bone, with no injury to ankle ligaments. A fracture of the talus that does not have any displacement (movement of the bone on either side of the fracture line) is also stable.   Unstable fracture. This includes more than one fracture line through one or more bones in the ankle joint. It also includes fractures that have displacement of the bone on either side of the fracture line.  CAUSES   A direct blow to the ankle.    Quickly and severely twisting your ankle.   Trauma, such as a car accident or falling from a significant height.  RISK FACTORS  You may be at a higher risk of ankle fracture if:   You have certain medical conditions.   You are involved in high-impact sports.   You are involved in a high-impact car accident.  SIGNS AND SYMPTOMS    Tender and swollen ankle.   Bruising around the injured ankle.   Pain on movement of the ankle.   Difficulty walking or putting weight on the ankle.   A cold foot below the site of the ankle injury. This can occur if the blood vessels passing through your injured ankle were also damaged.   Numbness in the foot below the site of the ankle injury.  DIAGNOSIS   An ankle fracture is usually diagnosed with a physical exam and X-rays. A CT scan may also be required for complex fractures.  TREATMENT   Stable fractures are treated with a cast or splint and using crutches to avoid putting  weight on your injured ankle. This is followed by an ankle strengthening program. Some patients require a special type of cast, depending on other medical problems they may have. Unstable fractures require surgery to ensure the bones heal properly. Your health care provider will tell you what type of fracture you have and the best treatment for your condition.  HOME CARE INSTRUCTIONS    Review correct crutch use with your health care provider and use your crutches as directed. Safe use of crutches is extremely important. Misuse of crutches can cause you to fall or cause injury to nerves in your hands or armpits.   Do not put weight or pressure on the injured ankle until directed by your health care provider.   To lessen the swelling, keep the injured leg elevated while sitting or lying down.   Apply ice to the injured area:   Put ice in a plastic bag.   Place a towel between your cast and the bag.   Leave the ice on for 20 minutes, 2-3 times a day.   If you have a plaster or fiberglass cast:   Do not try to scratch the skin under the cast with any objects. This can increase your risk of skin infection.   Check the skin around the cast every day. You   may put lotion on any red or sore areas.   Keep your cast dry and clean.   If you have a plaster splint:   Wear the splint as directed.   You may loosen the elastic around the splint if your toes become numb, tingle, or turn cold or blue.   Do not put pressure on any part of your cast or splint; it may break. Rest your cast only on a pillow the first 24 hours until it is fully hardened.   Your cast or splint can be protected during bathing with a plastic bag sealed to your skin with medical tape. Do not lower the cast or splint into water.   Take medicines as directed by your health care provider. Only take over-the-counter or prescription medicines for pain, discomfort, or fever as directed by your health care provider.   Do not drive a vehicle until  your health care provider specifically tells you it is safe to do so.   If your health care provider has given you a follow-up appointment, it is very important to keep that appointment. Not keeping the appointment could result in a chronic or permanent injury, pain, and disability. If you have any problem keeping the appointment, call the facility for assistance.  SEEK MEDICAL CARE IF:  You develop increased swelling or discomfort.  SEEK IMMEDIATE MEDICAL CARE IF:    Your cast gets damaged or breaks.   You have continued severe pain.   You develop new pain or swelling after the cast was put on.   Your skin or toenails below the injury turn blue or gray.   Your skin or toenails below the injury feel cold, numb, or have loss of sensitivity to touch.   There is a bad smell or pus draining from under the cast.  MAKE SURE YOU:    Understand these instructions.   Will watch your condition.   Will get help right away if you are not doing well or get worse.  Document Released: 08/12/2000 Document Revised: 08/20/2013 Document Reviewed: 03/14/2013  [employer] Patient Information 2015 [employer]. This information is not intended to replace advice given to you by your health care provider. Make sure you discuss any questions you have with your health care provider.

## 2014-06-12 NOTE — Progress Notes (Signed)
   Subjective:    Patient ID: Adam Rowland, male    DOB: 11/20/1945, 68 y.o.   MRN: [id number]  HPI Pt presents to the office for a fall about 30 mins ago. Pt states he was coming down a ladder and fell face forward on his chest and landed on his left ankle. Pt states he didn't walk or place any weight on ankle.  Pt states he is feeling light headed. Pt state he only takes one blood pressure medication, but no blood thinners.    Review of Systems  Constitutional: Negative.   HENT: Negative.   Respiratory: Negative.   Cardiovascular: Negative.   Gastrointestinal: Negative.   Endocrine: Negative.   Genitourinary: Negative.   Musculoskeletal: Negative.   Neurological: Negative.   Hematological: Negative.   Psychiatric/Behavioral: Negative.   All other systems reviewed and are negative.      Objective:   Physical Exam  Vitals reviewed. Constitutional: He is oriented to person, place, and time. He appears well-developed and well-nourished. No distress.  Eyes: Pupils are equal, round, and reactive to light. Right eye exhibits no discharge. Left eye exhibits no discharge.  Neck: Normal range of motion. Neck supple. No thyromegaly present.  Cardiovascular: Normal rate, regular rhythm, normal heart sounds and intact distal pulses.   No murmur heard. Pulmonary/Chest: Effort normal and breath sounds normal. No respiratory distress. He has no wheezes.  Abdominal: Soft. Bowel sounds are normal. He exhibits no distension. There is no tenderness.  Musculoskeletal: He exhibits no edema.       Left ankle: He exhibits decreased range of motion, swelling, ecchymosis, deformity and abnormal pulse. Tenderness.  Neurological: He is alert and oriented to person, place, and time. He has normal reflexes. No cranial nerve deficit.  Skin: Skin is warm and dry. Ecchymosis (Left chest brusing) noted. No rash noted. No erythema.  Left skin tear on left lower forearm   Psychiatric: He has a normal mood and  affect. His behavior is normal. Judgment and thought content normal.    There were no vitals taken for this visit.       Assessment & Plan:  1. Fall, initial encounter - DG Ankle Complete Left; Future  2. Ankle fracture, left- Displaced  -EMS called and pt transported to ED  Jannifer Rodney, FNP

## 2014-06-12 NOTE — ED Notes (Signed)
Ortho paged. 

## 2014-06-12 NOTE — ED Notes (Signed)
Skin color improved. Feeling better. Not feeling as faint.

## 2014-06-12 NOTE — ED Provider Notes (Signed)
Pt signed out that d/c instructions done, just waiting for repeat xr.  Repeat ankle xr not significantly changed from prior. No dislocation or sublux.   Discussed repeat xrays w ortho on call, Dr Eulah Pont - he indicates fine as is, leave splint in place and have f/u with him in office tomorrow.  Recheck pt. Pain controlled, declines any additional pain rx.  Distal pulses palp. Normal cap refill in toes. No numbness/weakness.  Pt appears stable for d/c.      Suzi Roots, MD 06/12/14 [phone]

## 2014-06-12 NOTE — ED Provider Notes (Signed)
CSN: [id number]     Arrival date & time 06/12/14  1306 History   First MD Initiated Contact with Patient 06/12/14 1307     Chief Complaint  Patient presents with  . Fall  . Ankle Pain     (Consider location/radiation/quality/duration/timing/severity/associated sxs/prior Treatment) The history is provided by the patient.  Adam SimmeringLeon W Daleen SquibbWall is a 68 y.o. male hx of HL, kidney stones here with fall. Was coming down from 8 ft ladder and fell 4 ft off the floor. He fell onto another ladder and hit his left chest on the ladder. Also injured his left ankle and left elbow. Tetanus up to date. [facility] and felt light headed and dizzy and almost passed out. Denies head or neck injury during fall.    Past Medical History  Diagnosis Date  . History of kidney stones   . Hyperlipidemia    Past Surgical History  Procedure Laterality Date  . Nasal septum surgery     Family History  Problem Relation Age of Onset  . Colon cancer Neg Hx   . Stomach cancer Neg Hx   . Esophageal cancer Neg Hx   . Hypertension Mother   . Cancer Father     bladder ca   History  Substance Use Topics  . Smoking status: Former Smoker    Types: Cigarettes    Quit date: 08/29/1993  . Smokeless tobacco: Never Used  . Alcohol Use: 4.2 oz/week    7 Glasses of wine per week     Comment: wine nightly     Review of Systems  Cardiovascular: Positive for chest pain.  Musculoskeletal:       L ankle and elbow pain   All other systems reviewed and are negative.     Allergies  Review of patient's allergies indicates no known allergies.  Home Medications   Prior to Admission medications   Medication Sig Start Date End Date Taking? Authorizing Provider  atorvastatin (LIPITOR) 40 MG tablet Take 1 tablet (40 mg total) by mouth daily. 01/16/14  Yes [name] Daphine DeutscherMartin, FNP  lisinopril (PRINIVIL,ZESTRIL) 20 MG tablet TAKE 1 TABLET DAILY 01/16/14  Yes [name] Daphine DeutscherMartin, FNP  oxyCODONE-acetaminophen (PERCOCET)  5-325 MG per tablet Take 2 tablets by mouth every 6 (six) hours as needed. 06/12/14   Richardean Canalavid H Yao, MD   BP 128/65  Pulse 64  Temp(Src) 98.1 F (36.7 C) (Oral)  Resp 18  SpO2 99% Physical Exam  Nursing note and vitals reviewed. Constitutional: He is oriented to person, place, and time. He appears well-developed and well-nourished.  Uncomfortable   HENT:  Head: Normocephalic and atraumatic.  Mouth/Throat: Oropharynx is clear and moist.  Eyes: Conjunctivae and EOM are normal. Pupils are equal, round, and reactive to light.  Neck: Normal range of motion. Neck supple.  Cardiovascular: Normal rate, regular rhythm and normal heart sounds.   Pulmonary/Chest: Effort normal and breath sounds normal. No respiratory distress. He has no wheezes. He has no rales.  Obvious bruise L anterior chest, no flail chest   Abdominal: Soft. Bowel sounds are normal. He exhibits no distension. There is no tenderness. There is no rebound and no guarding.  Musculoskeletal:  L ankle with obvious deformity with 2+ DP pulse. L elbow with abrasion with nl ROM. Bilateral hips nl ROM. No spinal tenderness. Pelvis stable   Neurological: He is alert and oriented to person, place, and time.  Skin: Skin is warm and dry.  Psychiatric: He has a normal mood and affect.  His behavior is normal. Judgment and thought content normal.    ED Course  Reduction of fracture Date/Time: 06/12/2014 5:00 PM Performed by: Richardean Canal Authorized by: Richardean Canal Consent: Verbal consent obtained. Risks and benefits: risks, benefits and alternatives were discussed Consent given by: patient Patient understanding: patient states understanding of the procedure being performed Patient consent: the patient's understanding of the procedure matches consent given Procedure consent: procedure consent matches procedure scheduled Relevant documents: relevant documents present and verified Test results: test results available and properly  labeled Required items: required blood products, implants, devices, and special equipment available Patient identity confirmed: verbally with patient and arm band Time out: Immediately prior to procedure a "time out" was called to verify the correct patient, procedure, equipment, support staff and site/side marked as required. Local anesthesia used: no Patient sedated: no Patient tolerance: Patient tolerated the procedure well with no immediate complications. Comments: L ankle reduction. Splint applied by me    (including critical care time) Labs Review Labs Reviewed  CBC WITH DIFFERENTIAL - Abnormal; Notable for the following:    WBC 10.9 (*)    Neutrophils Relative % 85 (*)    Neutro Abs 9.2 (*)    Lymphocytes Relative 9 (*)    All other components within normal limits  COMPREHENSIVE METABOLIC PANEL - Abnormal; Notable for the following:    Glucose, Bld 116 (*)    GFR calc non Af Amer 90 (*)    All other components within normal limits  I-STAT TROPOININ, ED    Imaging Review Dg Ribs Unilateral W/chest Left  06/12/2014   CLINICAL DATA:  Fall today from 3 foot ladder. Multifocal pain. Initial evaluation.  EXAM: LEFT RIBS AND CHEST - 3+ VIEW  COMPARISON:  Chest radiograph October 08, 2012  FINDINGS: No fracture or other bone lesions are seen involving the ribs. There is no evidence of pneumothorax or pleural effusion. Both lungs are clear. Heart size and mediastinal contours are within normal limits.  IMPRESSION: No acute cardiopulmonary process nor rib fracture deformity.   Electronically Signed   By: Awilda Metro   On: 06/12/2014 15:39   Dg Elbow Complete Left  06/12/2014   CLINICAL DATA:  Fall from 3 foot ladder today.  Multifocal pain.  EXAM: LEFT ELBOW - COMPLETE 3+ VIEW  COMPARISON:  None.  FINDINGS: There is no evidence of fracture, dislocation, or joint effusion. There is no evidence of arthropathy or other focal bone abnormality. Soft tissues are normal; antecubital  intravenous catheter in place.  IMPRESSION: Negative.   Electronically Signed   By: Awilda Metro   On: 06/12/2014 15:40   Dg Ankle Complete Left  06/12/2014   CLINICAL DATA:  Fall from 3 foot ladder today, multifocal pain. Initial evaluation.  EXAM: LEFT ANKLE COMPLETE - 3+ VIEW  COMPARISON:  None.  FINDINGS: Comminuted impacted tibial plateau plafond fracture extending into the medial malleolus, posterior malleolus. Widened medial ankle mortise. Oblique distal fibular fracture with mildly laterally angulated fracture apex. No dislocation. No destructive bony lesions. Moderate vascular calcifications. Soft tissue swelling without subcutaneous gas or radiopaque foreign bodies.  IMPRESSION: Comminuted displaced tibial plafond fracture (pilon fracture). Mildly displaced distal fibular fracture. No dislocation.  Widened ankle mortise concerning for ligamentous injury without frank dislocation.  Recommend radiograph of the knee to evaluate for proximal fibular fracture.   Electronically Signed   By: Awilda Metro   On: 06/12/2014 15:43     EKG Interpretation   Date/Time:  Thursday June 12 2014 13:59:52 EDT Ventricular Rate:  58 PR Interval:  163 QRS Duration: 113 QT Interval:  409 QTC Calculation: 402 R Axis:   63 Text Interpretation:  Sinus rhythm Borderline intraventricular conduction  delay Abnormal R-wave progression, late transition No previous ECGs  available Confirmed by YAO  MD, DAVID ([id number]) on 06/12/2014 2:08:44 PM      MDM   Final diagnoses:  Ankle fracture, bimalleolar, closed, left, initial encounter    DERYN ABRAMOWICZ is a 68 y.o. male here with fall, possible syncope. Will get xrays and check labs and EKG.   4:03 PM Labs unremarkable. CXR and L elbow xray unremarkable. Xray ankle showed distal tib and fib fracture. I called Dr. Eulah Pont who recommend straightening out ankle with post reduction xray and f/u tomorrow 8:30 AM in his office. Will sign out to Dr. Denton Lank to  f/u post reduction xray.     Richardean Canal, MD 06/12/14 1700

## 2014-06-12 NOTE — ED Notes (Signed)
Patient OTF, unable to re-assess pain at this time.

## 2014-06-12 NOTE — Progress Notes (Signed)
Orthopedic Tech Progress Note Patient Details:  Adam Rowland [birth year] [id number] Ankle reduction. Posterior SLS with stirrup applied to LLE. Application tolerated well. Crutches provided. Ortho Devices Type of Ortho Device: Ace wrap;Crutches;Stirrup splint Ortho Device/Splint Location: LLE Ortho Device/Splint Interventions: Application   Asia R Thompson 06/12/2014, 4:19 PM

## 2014-06-12 NOTE — Discharge Instructions (Signed)
Wear splint and use crutches. No weight bearing on left leg until cleared to do so by orthopedist.   Keep splint dry.  Keep left foot/ankle elevated as much as possible, even while sleeping, to help with pain and swelling.   Take motrin for pain.   Take percocet for severe pain. No driving when taking.   See Dr. Eulah PontMurphy tomorrow in his office at 8:30 AM.   Return to ER if you have severe pain, chest pain.      Ankle Fracture A fracture is a break in a bone. The ankle joint is made up of three bones. These include the lower (distal)sections of your lower leg bones, called the tibia and fibula, along with a bone in your foot, called the talus. Depending on how bad the break is and if more than one ankle joint bone is broken, a cast or splint is used to protect and keep your injured bone from moving while it heals. Sometimes, surgery is required to help the fracture heal properly.  There are two general types of fractures:  Stable fracture. This includes a single fracture line through one bone, with no injury to ankle ligaments. A fracture of the talus that does not have any displacement (movement of the bone on either side of the fracture line) is also stable.  Unstable fracture. This includes more than one fracture line through one or more bones in the ankle joint. It also includes fractures that have displacement of the bone on either side of the fracture line. CAUSES  A direct blow to the ankle.   Quickly and severely twisting your ankle.  Trauma, such as a car accident or falling from a significant height. RISK FACTORS You may be at a higher risk of ankle fracture if:  You have certain medical conditions.  You are involved in high-impact sports.  You are involved in a high-impact car accident. SIGNS AND SYMPTOMS   Tender and swollen ankle.  Bruising around the injured ankle.  Pain on movement of the ankle.  Difficulty walking or putting weight on the ankle.  A cold  foot below the site of the ankle injury. This can occur if the blood vessels passing through your injured ankle were also damaged.  Numbness in the foot below the site of the ankle injury. DIAGNOSIS  An ankle fracture is usually diagnosed with a physical exam and X-rays. A CT scan may also be required for complex fractures. TREATMENT  Stable fractures are treated with a cast or splint and using crutches to avoid putting weight on your injured ankle. This is followed by an ankle strengthening program. Some patients require a special type of cast, depending on other medical problems they may have. Unstable fractures require surgery to ensure the bones heal properly. Your health care provider will tell you what type of fracture you have and the best treatment for your condition. HOME CARE INSTRUCTIONS   Review correct crutch use with your health care provider and use your crutches as directed. Safe use of crutches is extremely important. Misuse of crutches can cause you to fall or cause injury to nerves in your hands or armpits.  Do not put weight or pressure on the injured ankle until directed by your health care provider.  To lessen the swelling, keep the injured leg elevated while sitting or lying down.  Apply ice to the injured area:  Put ice in a plastic bag.  Place a towel between your cast and the bag.  Leave the ice on for 20 minutes, 2-3 times a day.  If you have a plaster or fiberglass cast:  Do not try to scratch the skin under the cast with any objects. This can increase your risk of skin infection.  Check the skin around the cast every day. You may put lotion on any red or sore areas.  Keep your cast dry and clean.  If you have a plaster splint:  Wear the splint as directed.  You may loosen the elastic around the splint if your toes become numb, tingle, or turn cold or blue.  Do not put pressure on any part of your cast or splint; it may break. Rest your cast only on a  pillow the first 24 hours until it is fully hardened.  Your cast or splint can be protected during bathing with a plastic bag sealed to your skin with medical tape. Do not lower the cast or splint into water.  Take medicines as directed by your health care provider. Only take over-the-counter or prescription medicines for pain, discomfort, or fever as directed by your health care provider.  Do not drive a vehicle until your health care provider specifically tells you it is safe to do so.  If your health care provider has given you a follow-up appointment, it is very important to keep that appointment. Not keeping the appointment could result in a chronic or permanent injury, pain, and disability. If you have any problem keeping the appointment, call the facility for assistance. SEEK MEDICAL CARE IF: You develop increased swelling or discomfort. SEEK IMMEDIATE MEDICAL CARE IF:   Your cast gets damaged or breaks.  You have continued severe pain.  You develop new pain or swelling after the cast was put on.  Your skin or toenails below the injury turn blue or gray.  Your skin or toenails below the injury feel cold, numb, or have loss of sensitivity to touch.  There is a bad smell or pus draining from under the cast. MAKE SURE YOU:   Understand these instructions.  Will watch your condition.  Will get help right away if you are not doing well or get worse. Document Released: 08/12/2000 Document Revised: 08/20/2013 Document Reviewed: 03/14/2013 [facility] Patient Information 2015 [place], [place]. This information is not intended to replace advice given to you by your health care provider. Make sure you discuss any questions you have with your health care provider.    Cryotherapy Cryotherapy means treatment with cold. Ice or gel packs can be used to reduce both pain and swelling. Ice is the most helpful within the first 24 to 48 hours after an injury or flare-up from overusing a muscle or  joint. Sprains, strains, spasms, burning pain, shooting pain, and aches can all be eased with ice. Ice can also be used when recovering from surgery. Ice is effective, has very few side effects, and is safe for most people to use. PRECAUTIONS  Ice is not a safe treatment option for people with:  Raynaud phenomenon. This is a condition affecting small blood vessels in the extremities. Exposure to cold may cause your problems to return.  Cold hypersensitivity. There are many forms of cold hypersensitivity, including:  Cold urticaria. Red, itchy hives appear on the skin when the tissues begin to warm after being iced.  Cold erythema. This is a red, itchy rash caused by exposure to cold.  Cold hemoglobinuria. Red blood cells break down when the tissues begin to warm after being iced. The  hemoglobin that carry oxygen are passed into the urine because they cannot combine with blood proteins fast enough.  Numbness or altered sensitivity in the area being iced. If you have any of the following conditions, do not use ice until you have discussed cryotherapy with your caregiver:  Heart conditions, such as arrhythmia, angina, or chronic heart disease.  High blood pressure.  Healing wounds or open skin in the area being iced.  Current infections.  Rheumatoid arthritis.  Poor circulation.  Diabetes. Ice slows the blood flow in the region it is applied. This is beneficial when trying to stop inflamed tissues from spreading irritating chemicals to surrounding tissues. However, if you expose your skin to cold temperatures for too long or without the proper protection, you can damage your skin or nerves. Watch for signs of skin damage due to cold. HOME CARE INSTRUCTIONS Follow these tips to use ice and cold packs safely.  Place a dry or damp towel between the ice and skin. A damp towel will cool the skin more quickly, so you may need to shorten the time that the ice is used.  For a more rapid  response, add gentle compression to the ice.  Ice for no more than 10 to 20 minutes at a time. The bonier the area you are icing, the less time it will take to get the benefits of ice.  Check your skin after 5 minutes to make sure there are no signs of a poor response to cold or skin damage.  Rest 20 minutes or more between uses.  Once your skin is numb, you can end your treatment. You can test numbness by very lightly touching your skin. The touch should be so light that you do not see the skin dimple from the pressure of your fingertip. When using ice, most people will feel these normal sensations in this order: cold, burning, aching, and numbness.  Do not use ice on someone who cannot communicate their responses to pain, such as small children or people with dementia. HOW TO MAKE AN ICE PACK Ice packs are the most common way to use ice therapy. Other methods include ice massage, ice baths, and cryosprays. Muscle creams that cause a cold, tingly feeling do not offer the same benefits that ice offers and should not be used as a substitute unless recommended by your caregiver. To make an ice pack, do one of the following:  Place crushed ice or a bag of frozen vegetables in a sealable plastic bag. Squeeze out the excess air. Place this bag inside another plastic bag. Slide the bag into a pillowcase or place a damp towel between your skin and the bag.  Mix 3 parts water with 1 part rubbing alcohol. Freeze the mixture in a sealable plastic bag. When you remove the mixture from the freezer, it will be slushy. Squeeze out the excess air. Place this bag inside another plastic bag. Slide the bag into a pillowcase or place a damp towel between your skin and the bag. SEEK MEDICAL CARE IF:  You develop white spots on your skin. This may give the skin a blotchy (mottled) appearance.  Your skin turns blue or pale.  Your skin becomes waxy or hard.  Your swelling gets worse. MAKE SURE YOU:    Understand these instructions.  Will watch your condition.  Will get help right away if you are not doing well or get worse. Document Released: 04/11/2011 Document Revised: 12/30/2013 Document Reviewed: 04/11/2011 [employer] Patient Information 2015  [employer]. This information is not intended to replace advice given to you by your health care provider. Make sure you discuss any questions you have with your health care provider.    Crutch Use Crutches are used to take weight off one of your legs or feet when you stand or walk. It is important to use crutches that fit properly. When fitted properly:  Each crutch should be 2-3 finger widths below the armpit.  Your weight should be supported by your hand, and not by resting the armpit on the crutch.  RISKS AND COMPLICATIONS Damage to the nerves that extend from your armpit to your hand and arm. To prevent this from happening, make sure your crutches fit properly and do not put pressure on your armpit when using them. HOW TO USE YOUR CRUTCHES If you have been instructed to use partial weight bearing, apply (bear) the amount of weight as your health care provider suggests. Do not bear weight in an amount that causes pain to the area of injury. Walking 1. Step with the crutches. 2. Swing the healthy leg slightly ahead of the crutches. Going Up Steps If there is no handrail: 1. Step up with the healthy leg. 2. Step up with the crutches and injured leg. 3. Continue in this way. If there is a handrail: 1. Hold both crutches in one hand. 2. Place your free hand on the handrail. 3. While putting your weight on your arms, lift your healthy leg to the step. 4. Bring the crutches and the injured leg up to that step. 5. Continue in this way. Going Down Steps Be very careful, as going down stairs with crutches is very challenging. If there is no handrail: 1. Step down with the injured leg and crutches. 2. Step down with the healthy  leg. If there is a handrail: 1. Place your hand on the handrail. 2. Hold both crutches with your free hand. 3. Lower your injured leg and crutch to the step below you. Make sure to keep the crutch tips in the center of the step, never on the edge. 4. Lower your healthy leg to that step. 5. Continue in this way. Standing Up 1. Hold the injured leg forward. 2. Grab the armrest with one hand and the top of the crutches with the other hand. 3. Using these supports, pull yourself up to a standing position. Sitting Down 1. Hold the injured leg forward. 2. Grab the armrest with one hand and the top of the crutches with the other hand. 3. Lower yourself to a sitting position. SEEK MEDICAL CARE IF:  You still feel unsteady on your feet.  You develop new pain, for example in your armpits, back, shoulder, wrist, or hip.  You develop any numbness or tingling. SEEK IMMEDIATE MEDICAL CARE IF: You fall. Document Released: 08/12/2000 Document Revised: 08/20/2013 Document Reviewed: 04/22/2013 [facility] Patient Information 2015 [place], [place]. This information is not intended to replace advice given to you by your health care provider. Make sure you discuss any questions you have with your health care provider.    Splint Care Splints protect and rest injuries. Splints can be made of plaster, fiberglass, or metal. They are used to treat broken bones, sprains, tendonitis, and other injuries. HOME CARE  Keep the injured area raised (elevated) while sitting or lying down. Keep the injured body part just above the level of the heart. This will decrease puffiness (swelling) and pain.  If an elastic bandage was used to hold the  splint, it can be loosened. Only loosen it to make room for puffiness and to ease pain.  Keep the splint clean and dry.  Do not scratch the skin under the splint with sharp or pointed objects.  Follow up with your doctor as told. GET HELP RIGHT AWAY IF:   There is more pain  or pressure around the injury.  There is numbness, tingling, or pain in the toes or fingers past the injury.  The fingers or toes become cold or blue.  The splint becomes too soft or breaks before the injury is healed. MAKE SURE YOU:   Understand these instructions.  Will watch this condition.  Will get help right away if you are not doing well or get worse. Document Released: 05/24/2008 Document Revised: 11/07/2011 Document Reviewed: 05/24/2008 [facility] Patient Information 2015 [place], [place]. This information is not intended to replace advice given to you by your health care provider. Make sure you discuss any questions you have with your health care provider.

## 2014-06-13 ENCOUNTER — Ambulatory Visit
Admission: RE | Admit: 2014-06-13 | Discharge: 2014-06-13 | Disposition: A | Discharge status: Home / Self Care | Payer: Medicare Other | Source: Ambulatory Visit | Attending: Orthopedic Surgery | Admitting: Orthopedic Surgery

## 2014-06-13 ENCOUNTER — Other Ambulatory Visit: Payer: Self-pay | Admitting: Orthopedic Surgery

## 2014-06-13 ENCOUNTER — Other Ambulatory Visit: Payer: Self-pay

## 2014-06-13 DIAGNOSIS — T148XXA Other injury of unspecified body region, initial encounter: Secondary | ICD-10-CM

## 2014-06-19 ENCOUNTER — Inpatient Hospital Stay (HOSPITAL_COMMUNITY)
Admission: EM | Admit: 2014-06-19 | Discharge: 2014-06-21 | DRG: 247 | Disposition: A | Discharge status: Home / Self Care | Payer: Medicare Other | Attending: Cardiovascular Disease | Admitting: Cardiovascular Disease

## 2014-06-19 ENCOUNTER — Emergency Department (HOSPITAL_COMMUNITY): Payer: Medicare Other

## 2014-06-19 ENCOUNTER — Encounter (HOSPITAL_COMMUNITY): Payer: Self-pay | Admitting: Emergency Medicine

## 2014-06-19 DIAGNOSIS — I252 Old myocardial infarction: Secondary | ICD-10-CM | POA: Diagnosis present

## 2014-06-19 DIAGNOSIS — R739 Hyperglycemia, unspecified: Secondary | ICD-10-CM | POA: Diagnosis not present

## 2014-06-19 DIAGNOSIS — R9431 Abnormal electrocardiogram [ECG] [EKG]: Secondary | ICD-10-CM

## 2014-06-19 DIAGNOSIS — R079 Chest pain, unspecified: Secondary | ICD-10-CM | POA: Diagnosis not present

## 2014-06-19 DIAGNOSIS — I2583 Coronary atherosclerosis due to lipid rich plaque: Secondary | ICD-10-CM

## 2014-06-19 DIAGNOSIS — Z9889 Other specified postprocedural states: Secondary | ICD-10-CM

## 2014-06-19 DIAGNOSIS — Z87891 Personal history of nicotine dependence: Secondary | ICD-10-CM

## 2014-06-19 DIAGNOSIS — I472 Ventricular tachycardia: Secondary | ICD-10-CM | POA: Diagnosis not present

## 2014-06-19 DIAGNOSIS — Z8249 Family history of ischemic heart disease and other diseases of the circulatory system: Secondary | ICD-10-CM

## 2014-06-19 DIAGNOSIS — I2510 Atherosclerotic heart disease of native coronary artery without angina pectoris: Secondary | ICD-10-CM

## 2014-06-19 DIAGNOSIS — R52 Pain, unspecified: Secondary | ICD-10-CM

## 2014-06-19 DIAGNOSIS — I214 Non-ST elevation (NSTEMI) myocardial infarction: Secondary | ICD-10-CM

## 2014-06-19 DIAGNOSIS — I4729 Other ventricular tachycardia: Secondary | ICD-10-CM

## 2014-06-19 DIAGNOSIS — I213 ST elevation (STEMI) myocardial infarction of unspecified site: Secondary | ICD-10-CM

## 2014-06-19 DIAGNOSIS — N200 Calculus of kidney: Secondary | ICD-10-CM

## 2014-06-19 DIAGNOSIS — E785 Hyperlipidemia, unspecified: Secondary | ICD-10-CM

## 2014-06-19 DIAGNOSIS — I255 Ischemic cardiomyopathy: Secondary | ICD-10-CM

## 2014-06-19 DIAGNOSIS — I10 Essential (primary) hypertension: Secondary | ICD-10-CM

## 2014-06-19 DIAGNOSIS — I209 Angina pectoris, unspecified: Secondary | ICD-10-CM

## 2014-06-19 HISTORY — DX: Other ventricular tachycardia: I47.29

## 2014-06-19 HISTORY — DX: Ventricular tachycardia: I47.2

## 2014-06-19 HISTORY — DX: Calculus of kidney: N20.0

## 2014-06-19 HISTORY — DX: Atherosclerotic heart disease of native coronary artery without angina pectoris: I25.10

## 2014-06-19 HISTORY — DX: Essential (primary) hypertension: I10

## 2014-06-19 HISTORY — DX: Ischemic cardiomyopathy: I25.5

## 2014-06-19 LAB — COMPREHENSIVE METABOLIC PANEL
ALT: 18 U/L (ref 0–53)
AST: 19 U/L (ref 0–37)
Albumin: 3.4 g/dL — ABNORMAL LOW (ref 3.5–5.2)
Alkaline Phosphatase: 60 U/L (ref 39–117)
Anion gap: 12 (ref 5–15)
BUN: 14 mg/dL (ref 6–23)
CALCIUM: 8.8 mg/dL (ref 8.4–10.5)
CO2: 27 meq/L (ref 19–32)
CREATININE: 0.78 mg/dL (ref 0.50–1.35)
Chloride: 100 mEq/L (ref 96–112)
GFR calc non Af Amer: >90 mL/min (ref >90)
Glucose, Bld: 119 mg/dL — ABNORMAL HIGH (ref 70–99)
Potassium: 4.5 mEq/L (ref 3.7–5.3)
Sodium: 139 mEq/L (ref 137–147)
Total Bilirubin: 1.1 mg/dL (ref 0.3–1.2)
Total Protein: 6.8 g/dL (ref 6.0–8.3)

## 2014-06-19 LAB — PROTIME-INR
INR: 1.16 (ref 0.00–1.49)
Prothrombin Time: 14.9 seconds (ref 11.6–15.2)

## 2014-06-19 LAB — CBC WITH DIFFERENTIAL/PLATELET
BASOS ABS: 0 10*3/uL (ref 0.0–0.1)
Basophils Relative: 0 % (ref 0–1)
Eosinophils Absolute: 0 10*3/uL (ref 0.0–0.7)
Eosinophils Relative: 0 % (ref 0–5)
HEMATOCRIT: 37.2 % — AB (ref 39.0–52.0)
Hemoglobin: 13 g/dL (ref 13.0–17.0)
Lymphocytes Relative: 14 % (ref 12–46)
Lymphs Abs: 1.5 10*3/uL (ref 0.7–4.0)
MCH: 33.2 pg (ref 26.0–34.0)
MCHC: 34.9 g/dL (ref 30.0–36.0)
MCV: 94.9 fL (ref 78.0–100.0)
MONO ABS: 0.7 10*3/uL (ref 0.1–1.0)
MONOS PCT: 7 % (ref 3–12)
Neutro Abs: 8.3 10*3/uL — ABNORMAL HIGH (ref 1.7–7.7)
Neutrophils Relative %: 79 % — ABNORMAL HIGH (ref 43–77)
Platelets: 266 10*3/uL (ref 150–400)
RBC: 3.92 MIL/uL — ABNORMAL LOW (ref 4.22–5.81)
RDW: 12.6 % (ref 11.5–15.5)
WBC: 10.6 10*3/uL — ABNORMAL HIGH (ref 4.0–10.5)

## 2014-06-19 LAB — I-STAT CHEM 8, ED
BUN: 14 mg/dL (ref 6–23)
CREATININE: 0.8 mg/dL (ref 0.50–1.35)
Calcium, Ion: 1.13 mmol/L (ref 1.13–1.30)
Chloride: 101 mEq/L (ref 96–112)
Glucose, Bld: 120 mg/dL — ABNORMAL HIGH (ref 70–99)
HEMATOCRIT: 38 % — AB (ref 39.0–52.0)
HEMOGLOBIN: 12.9 g/dL — AB (ref 13.0–17.0)
POTASSIUM: 4.1 meq/L (ref 3.7–5.3)
Sodium: 136 mEq/L — ABNORMAL LOW (ref 137–147)
TCO2: 27 mmol/L (ref 0–100)

## 2014-06-19 LAB — I-STAT TROPONIN, ED: Troponin i, poc: 0.31 ng/mL (ref 0.00–0.08)

## 2014-06-19 LAB — TROPONIN I: TROPONIN I: 1.37 ng/mL — AB (ref <0.30)

## 2014-06-19 MED ORDER — HEPARIN BOLUS VIA INFUSION
4000.0000 [IU] | Freq: Once | INTRAVENOUS | Status: DC
  Filled 2014-06-19: qty 4000

## 2014-06-19 MED ORDER — NITROGLYCERIN 0.4 MG SL SUBL: 0.4000 mg | SUBLINGUAL_TABLET | SUBLINGUAL | Status: DC | PRN

## 2014-06-19 MED ORDER — SODIUM CHLORIDE 0.9 % IV SOLN
Freq: Once | INTRAVENOUS | Status: AC
  Administered 2014-06-19: 21:00:00 via INTRAVENOUS

## 2014-06-19 MED ORDER — ASPIRIN EC 81 MG PO TBEC
81.0000 mg | DELAYED_RELEASE_TABLET | Freq: Every day | ORAL | Status: DC
  Administered 2014-06-21: 81 mg via ORAL
  Filled 2014-06-19 (×2): qty 1

## 2014-06-19 MED ORDER — ATORVASTATIN CALCIUM 40 MG PO TABS
40.0000 mg | ORAL_TABLET | Freq: Every day | ORAL | Status: DC
Start: 2014-06-20 — End: 2014-06-21
  Administered 2014-06-20: 40 mg via ORAL
  Filled 2014-06-19 (×2): qty 1

## 2014-06-19 MED ORDER — HEPARIN (PORCINE) IN NACL 100-0.45 UNIT/ML-% IJ SOLN
1300.0000 [IU]/h | INTRAMUSCULAR | Status: DC
  Administered 2014-06-19: 1000 [IU]/h via INTRAVENOUS

## 2014-06-19 MED ORDER — LISINOPRIL 10 MG PO TABS
10.0000 mg | ORAL_TABLET | Freq: Every day | ORAL | Status: DC
  Administered 2014-06-20 – 2014-06-21 (×2): 10 mg via ORAL
  Filled 2014-06-19 (×2): qty 1

## 2014-06-19 MED ORDER — OXYCODONE-ACETAMINOPHEN 5-325 MG PO TABS
2.0000 | ORAL_TABLET | Freq: Four times a day (QID) | ORAL | Status: DC | PRN
  Administered 2014-06-19 – 2014-06-20 (×2): 2 via ORAL
  Filled 2014-06-19 (×2): qty 2

## 2014-06-19 MED ORDER — METOPROLOL TARTRATE 12.5 MG HALF TABLET
12.5000 mg | ORAL_TABLET | Freq: Two times a day (BID) | ORAL | Status: DC
  Filled 2014-06-19 (×2): qty 1

## 2014-06-19 MED ORDER — ASPIRIN 81 MG PO CHEW
324.0000 mg | CHEWABLE_TABLET | Freq: Once | ORAL | Status: AC
  Administered 2014-06-19: 324 mg via ORAL
  Filled 2014-06-19: qty 4

## 2014-06-19 MED ORDER — ONDANSETRON HCL 4 MG/2ML IJ SOLN: 4.0000 mg | Freq: Four times a day (QID) | INTRAMUSCULAR | Status: DC | PRN

## 2014-06-19 MED ORDER — ACETAMINOPHEN 325 MG PO TABS
650.0000 mg | ORAL_TABLET | ORAL | Status: DC | PRN
  Administered 2014-06-20: 650 mg via ORAL
  Filled 2014-06-19: qty 2

## 2014-06-19 MED ORDER — HEPARIN (PORCINE) IN NACL 100-0.45 UNIT/ML-% IJ SOLN
1000.0000 [IU]/h | INTRAMUSCULAR | Status: DC
  Filled 2014-06-19: qty 250

## 2014-06-19 NOTE — H&P (Signed)
The patient was seen and examined, and I agree with the assessment and plan as documented above which has been discussed in detail with Azalee Course, PA-C. Pt with multiple cardiovascular risk factors underwent ORIF of left ankle this morning with subsequent development of chest pain. Upon further questioning, he has been having episodic chest pain usually provoked by exertion lasting 3-5 minutes with resolution with rest, ongoing for past three months. Mildly elevated POC troponin, with serum troponin elevated at 1.37. ECG on 06/12/14 demonstrated sinus rhythm with IVCD (QRS 113 ms) and Q waves in III. ECG today demonstrates sinus rhythm with signficant Q waves in both III and aVF. Pt received ASA, ACEI, and statin. Pt currently free of chest pain and hemodynamically stable.  RECS: I had a lengthy discussion with both the patient and his wife regarding NSTEMI, antiplatelet therapy, and risks of bleeding with recent surgery. Ortho has also been consulted with respect to IV heparin. Would recommend treating at present with ASA, statin, ACEI, beta blocker, and IV heparin and check serial troponins. If they continue to trend upwards, would recommend coronary angiography in the morning. If they trend down, would medically manage and perhaps d/c IV heparin on 10/23. Would then recommend outpatient nuclear stress testing to assess ischemic burden to see if coronary angiography is warranted, or if myocardial scar is present. Will obtain echocardiogram to assess LV systolic function and inferior Olsen in particular. No murmurs, rubs, or gallops on physical exam. Will need to assess for bleeding at surgical site looking for swelling, oozing, or development of compartment syndrome, along with monitoring of serial Hgb. Pt and wife are in agreement with this plan, and are very appreciative. Pt lives in [place] and prefers to f/u with me in clinic in [place] or [place].

## 2014-06-19 NOTE — ED Notes (Signed)
Attempted report 

## 2014-06-19 NOTE — ED Notes (Signed)
[facility] presents with a 68 yo male from [facility] with chest pain.  Pt just completed ankle surgery when he c/o chest pain just as he got out of surgery.  ECG was done by staff and stated it was different from past ECGs.  No SOB at this time.  No pain at this time. (pt rates at "1")  VS stable.  ECG NSR

## 2014-06-19 NOTE — H&P (Signed)
Patient ID: Adam Rowland MRN: [id number], DOB/AGE: October 15, 1945   Admit date: 06/19/2014   Primary Physician: Rudi HeapMOORE, DONALD, MD Primary Cardiologist: New   Pt. Profile:  68 year old Caucasian male with past medical history of hypertension, hyperlipidemia, and former smoker present with CP and elevated trop after ORIF of L ankle  Problem List  Past Medical History  Diagnosis Date  . History of kidney stones   . Hyperlipidemia   . Hypertension     Past Surgical History  Procedure Laterality Date  . Nasal septum surgery    . Ankle fracture surgery       Allergies  No Known Allergies  HPI  The patient is a 68 year old Caucasian male with past medical history of hypertension, hyperlipidemia, and former smoker. According to the patient, the last time he saw a cardiologist was over 20 years ago. He had a negative stress test back then. He denies any significant family history of early coronary disease. According to the patient, even after retirement several years ago, he has remained very active working on several project with his son and with church. He denies any history of coronary artery disease, nor has he experienced any chest pain until several months ago.   Over the last several months, patient has been having intermittent chest pain. Sometimes he would notice it when he pushed lawnmower, the chest discomfort seems to resolve with rest. However the onset of the chest pain has not been very consistent with exertion. The duration of the chest pain has been 3-5 minutes each, and has never came close to 20 minutes before. He described it as a substernal chest pain without radiation. He denies any recent lower extremity swelling, orthopnea, paroxysmal nocturnal dyspnea, fever, chill, or cough. He was climbing up a ladder a week ago when he had a fall and suffered a left lower extremity fracture.   Patient underwent a scheduled ORIF of his left ankle in the morning of 06/19/2014.  After waking up from anesthesia, he complained of 7/10 chest pain. [facility]. On arrival his blood pressure was 139/65. O2 saturation 98% on room air. Heart rate 80s. Significant initial laboratory finding include white blood cell count of 10.6, point-of-care troponin was 0.31, and glucose of 119. Chest x-ray was negative for acute process. EKG showed normal sinus rhythm, deep Q waves in inferior lead. His previous EKG on 06/12/2014 showed Q waves in leads 3, however 2 and aVF only have very mild Q wave. Troponin was repeated, and a troponin I returned 1.37. Cardiology was consulted for elevated troponin and chest pain.   Home Medications  Prior to Admission medications   Medication Sig Start Date End Date Taking? Authorizing Provider  atorvastatin (LIPITOR) 40 MG tablet Take 1 tablet (40 mg total) by mouth daily. 01/16/14  Yes [name] Daphine DeutscherMartin, FNP  lisinopril (PRINIVIL,ZESTRIL) 20 MG tablet TAKE 1 TABLET DAILY 01/16/14  Yes [name] Daphine DeutscherMartin, FNP  oxyCODONE-acetaminophen (PERCOCET/ROXICET) 5-325 MG per tablet Take 2 tablets by mouth every 6 (six) hours as needed for moderate pain. 06/12/14  Yes Richardean Canalavid H Yao, MD    Family History  Family History  Problem Relation Age of Onset  . Colon cancer Neg Hx   . Stomach cancer Neg Hx   . Esophageal cancer Neg Hx   . Hypertension Mother   . Cancer Father     bladder ca    Social History  History   Social History  .  Marital Status: Married    Spouse Name: N/A    Number of Children: N/A  . Years of Education: N/A   Occupational History  . Not on file.   Social History Main Topics  . Smoking status: Former Smoker    Types: Cigarettes    Quit date: 08/30/1987  . Smokeless tobacco: Never Used     Comment: quit in 1989, smoked since he was a teenager  . Alcohol Use: 4.2 oz/week    7 Glasses of wine per week     Comment: 1 glass of wine nightly   . Drug Use: No  . Sexual Activity: Not on  file   Other Topics Concern  . Not on file   Social History Narrative  . No narrative on file     Review of Systems General:  No chills, fever, night sweats or weight changes.  Cardiovascular:  No dyspnea on exertion, edema, orthopnea, palpitations, paroxysmal nocturnal dyspnea. +chest pain on and off for several month Dermatological: No rash, lesions/masses.  L ankle fracture s/p surgery this morning Respiratory: No cough, dyspnea Urologic: No hematuria, dysuria Abdominal:   No nausea, vomiting, diarrhea, bright red blood per rectum, melena, or hematemesis Neurologic:  No visual changes, wkns, changes in mental status. All other systems reviewed and are otherwise negative except as noted above.  Physical Exam  Blood pressure 136/64, pulse 90, temperature 98.6 F (37 C), temperature source Oral, resp. rate 19, height 5' 8.9" (1.75 m), weight 185 lb (83.915 kg), SpO2 99.00%.  General: Pleasant, NAD Psych: Normal affect. Neuro: Alert and oriented X 3. Moves all extremities spontaneously. L LE in bandage HEENT: Normal  Neck: Supple without bruits or JVD. Lungs:  Resp regular and unlabored, CTA. Heart: RRR no s3, s4, or murmurs. Abdomen: Soft, non-tender, non-distended, BS + x 4.  Extremities: No clubbing, cyanosis or edema. DP/PT/Radials 2+ and equal bilaterally.  Labs  Troponin [facility] Test)  Recent Labs  06/19/14 1747  TROPIPOC 0.31*    Recent Labs  06/19/14 1800  TROPONINI 1.37*   Lab Results  Component Value Date   WBC 10.6* 06/19/2014   HGB 12.9* 06/19/2014   HCT 38.0* 06/19/2014   MCV 94.9 06/19/2014   PLT 266 06/19/2014    Recent Labs Lab 06/19/14 1738 06/19/14 1755  NA 139 136*  K 4.5 4.1  CL 100 101  CO2 27  --   BUN 14 14  CREATININE 0.78 0.80  CALCIUM 8.8  --   PROT 6.8  --   BILITOT 1.1  --   ALKPHOS 60  --   ALT 18  --   AST 19  --   GLUCOSE 119* 120*   Lab Results  Component Value Date   CHOL 137 01/16/2014   HDL 35*  01/16/2014   LDLCALC 83 01/16/2014   TRIG 96 01/16/2014   No results found for this basename: DDIMER     Radiology/Studies  Dg Ribs Unilateral W/chest Left  06/12/2014   CLINICAL DATA:  Fall today from 3 foot ladder. Multifocal pain. Initial evaluation.  EXAM: LEFT RIBS AND CHEST - 3+ VIEW  COMPARISON:  Chest radiograph October 08, 2012  FINDINGS: No fracture or other bone lesions are seen involving the ribs. There is no evidence of pneumothorax or pleural effusion. Both lungs are clear. Heart size and mediastinal contours are within normal limits.  IMPRESSION: No acute cardiopulmonary process nor rib fracture deformity.   Electronically Signed   By: Awilda Metro  On: 06/12/2014 15:39   Dg Elbow Complete Left  06/12/2014   CLINICAL DATA:  Fall from 3 foot ladder today.  Multifocal pain.  EXAM: LEFT ELBOW - COMPLETE 3+ VIEW  COMPARISON:  None.  FINDINGS: There is no evidence of fracture, dislocation, or joint effusion. There is no evidence of arthropathy or other focal bone abnormality. Soft tissues are normal; antecubital intravenous catheter in place.  IMPRESSION: Negative.   Electronically Signed   By: Awilda Metro   On: 06/12/2014 15:40   Dg Ankle Complete Left  06/12/2014   CLINICAL DATA:  Postreduction images of the LEFT ankle. Subsequent encounter.  EXAM: LEFT ANKLE COMPLETE - 3+ VIEW  COMPARISON:  06/12/2014, 1456 hr.  FINDINGS: Comminuted distal tibia and fibula fracture shows no interval change in position and alignment compared to prior radiographs. Splint material has been applied over the leg and ankle. There is persistent anterior subluxation of the talus relative to the tibial plafond.  IMPRESSION: Application of splint with no change in position alignment of ankle fractures.   Electronically Signed   By: Andreas Newport M.D.   On: 06/12/2014 17:18   Dg Ankle Complete Left  06/12/2014   CLINICAL DATA:  Fall from 3 foot ladder today, multifocal pain. Initial evaluation.   EXAM: LEFT ANKLE COMPLETE - 3+ VIEW  COMPARISON:  None.  FINDINGS: Comminuted impacted tibial plateau plafond fracture extending into the medial malleolus, posterior malleolus. Widened medial ankle mortise. Oblique distal fibular fracture with mildly laterally angulated fracture apex. No dislocation. No destructive bony lesions. Moderate vascular calcifications. Soft tissue swelling without subcutaneous gas or radiopaque foreign bodies.  IMPRESSION: Comminuted displaced tibial plafond fracture (pilon fracture). Mildly displaced distal fibular fracture. No dislocation.  Widened ankle mortise concerning for ligamentous injury without frank dislocation.  Recommend radiograph of the knee to evaluate for proximal fibular fracture.   Electronically Signed   By: Awilda Metro   On: 06/12/2014 15:43   Ct Ankle Left Wo Contrast  06/13/2014   CLINICAL DATA:  Larey Seat off a ladder on 06/12/2014. Evaluate complex ankle fractures for preoperative planning.  EXAM: CT OF THE LEFT ANKLE WITHOUT CONTRAST  TECHNIQUE: Multidetector CT imaging of the left ankle was performed according to the standard protocol. Multiplanar CT image reconstructions were also generated.  COMPARISON:  Radiographs 06/12/2014.  FINDINGS: Complex comminuted pilon type fracture of the distal tibia. Significant disruption of the articular surface in the midportion and anteriorly. Some of the articular fragments are depressed up to 9 mm.  Associated nondisplaced distal fibular fracture. Medial mortise widening and small bone fragments suggest a deltoid ligament tear. The talus is intact and the subtalar joints are normal.  Significant surrounding soft tissue swelling/ edema/hemorrhage.  No definite trapped medial or lateral ankle tendons.  IMPRESSION: Complex comminuted pilon type fracture of the distal tibia. Significant disruption of the articular surface with markedly depressed articular fragments. Please see 3D reformats.  Nondisplaced distal fibular  shaft fracture.  Medial mortise widening and small bone fragments suggest a deltoid ligament tear.   Electronically Signed   By: Loralie Champagne M.D.   On: 06/13/2014 11:39   Dg Chest Port 1 View  06/19/2014   CLINICAL DATA:  Left chest pain.  Status post ankle surgery today.  EXAM: PORTABLE CHEST - 1 VIEW  COMPARISON:  06/12/2014.  FINDINGS: Normal sized heart. Interval small amount of linear density in the right lower lung and left mid lung zone. Otherwise, clear lungs with normal vascularity. Unremarkable bones.  IMPRESSION: Small amount of interval bilateral linear atelectasis. Otherwise, unremarkable examination.   Electronically Signed   By: Gordan Payment M.D.   On: 06/19/2014 18:53    ECG  NSR with deep Q waves in inferior lead   ASSESSMENT AND PLAN  1. NSTEMI  - although surgery can elevate trop, the degree of troponin I elevation is higher than expected  - cardiac risk factor: HTN, HLD, former smoker  - has been experiencing CP on and off for several month, although has not been consistent with exertion, they are relieved with rest  - discussed with Dr. Purvis Sheffield regarding treatment, will start on heparin, obtain echo to eval Boyar motion and serial trop, if trop trend up overnight, will cath tomorrow. However if trop trend down and pt is chest pain free, considering Q waves and recent surgery/bleeding, will send him out of [facility] +/- plavix, outpatient stress test for risk stratification. NPO past midnight  2. HTN: continue lisinopril, add metoprolol 3. HLD  Signed, Azalee Course, Cordelia Poche 06/19/2014, 7:37 PM

## 2014-06-19 NOTE — Progress Notes (Addendum)
ANTICOAGULATION CONSULT NOTE - Initial Consult  Pharmacy Consult for heparin Indication: ACS/STEMI  No Known Allergies  Patient Measurements:   Heparin Dosing Weight: 83.9 kg  Vital Signs: Temp: 98.3 F (36.8 C) (10/22 1801) Temp Source: Oral (10/22 1801) BP: 139/65 mmHg (10/22 1801) Pulse Rate: 86 (10/22 1801)  Labs:  Recent Labs  06/19/14 1755  HGB 12.9*  HCT 38.0*  CREATININE 0.80    The CrCl is unknown because both a height and weight (above a minimum accepted value) are required for this calculation.   Medical History: Past Medical History  Diagnosis Date  . History of kidney stones   . Hyperlipidemia   . Hypertension     Medications:  No PTA anticoagulation  Assessment: 68 y/o M w/ chest pain. Patient just completed ankle surgery at [facility] when chest pain occurred.  ECG was done at the facility and showed abnormal results. Hgb 12.9, plts wnl. Pt is not on anticoagulation PTA.  Goal of Therapy:  Heparin level 0.3 - 0.7 Monitor platelets by anticoagulation protocol: yes  Plan:  Heparin 1000 units/hr F/u 6 hr heparin level, daily heparin level, daily CBC  Marisue Brooklyn 06/19/2014,6:05 PM  Withhold bolus given surgery today Note has been reviewed, I agree with the assessment and plan.  Agapito Games, PharmD, BCPS Clinical Pharmacist Pager: [phone] 06/19/2014 6:28 PM

## 2014-06-19 NOTE — ED Provider Notes (Signed)
CSN: [id number]     Arrival date & time 06/19/14  1715 History   First MD Initiated Contact with Patient 06/19/14 1716     Chief Complaint  Patient presents with  . Chest Pain     (Consider location/radiation/quality/duration/timing/severity/associated sxs/prior Treatment) HPI  Adam Rowland is a 68 y.o. male sent for evaluation after completed ORIF to left ankle this AM by Wandra Feinstein for chest pain and EKG changes. Patient fractured left ankle 7 days ago after fall from 8 foot ladder, states that after he woke up from anesthesia he had a 7/10 substernal chest pain (note that this is not the area of bruising from his fall a week ago) states that this pain is similar to pain that he has had before:  it is exertional, lasts for approximately 5 minutes. It is alleviated alleviated by rest. Pain is 7/10 at worst, it is nonradiating. It is not pleuritic. It seems to be exertional. Pain lasts for about This is associated with shortness of breath. He denies nausea, vomiting, cough, fever, calf pain or swelling. Now pain is 1/10. Patient was given aspirin by anesthesiologist.  Risk factors: Former smoker with 20 approximately 25-pack-year history, hypertension, hyperlipidemia, advanced age, FH (father's side MIs in 69s)  PCP: [facility]  Past Medical History  Diagnosis Date  . History of kidney stones   . Hyperlipidemia   . Hypertension    Past Surgical History  Procedure Laterality Date  . Nasal septum surgery    . Ankle fracture surgery     Family History  Problem Relation Age of Onset  . Colon cancer Neg Hx   . Stomach cancer Neg Hx   . Esophageal cancer Neg Hx   . Hypertension Mother   . Cancer Father     bladder ca   History  Substance Use Topics  . Smoking status: Former Smoker    Types: Cigarettes    Quit date: 08/30/1987  . Smokeless tobacco: Never Used     Comment: quit in 1989, smoked since he was a teenager  . Alcohol Use: 4.2 oz/week    7 Glasses  of wine per week     Comment: 1 glass of wine nightly     Review of Systems  10 systems reviewed and found to be negative, except as noted in the HPI.  Allergies  Review of patient's allergies indicates no known allergies.  Home Medications   Prior to Admission medications   Medication Sig Start Date End Date Taking? Authorizing Provider  atorvastatin (LIPITOR) 40 MG tablet Take 1 tablet (40 mg total) by mouth daily. 01/16/14  Yes [name] Daphine Deutscher, FNP  lisinopril (PRINIVIL,ZESTRIL) 20 MG tablet TAKE 1 TABLET DAILY 01/16/14  Yes [name] Daphine Deutscher, FNP  oxyCODONE-acetaminophen (PERCOCET/ROXICET) 5-325 MG per tablet Take 2 tablets by mouth every 6 (six) hours as needed for moderate pain. 06/12/14  Yes Richardean Canal, MD   BP 133/63  Pulse 86  Temp(Src) 98.6 F (37 C) (Oral)  Resp 14  Ht 5' 8.9" (1.75 m)  Wt 185 lb (83.915 kg)  BMI 27.40 kg/m2  SpO2 99% Physical Exam  Nursing note and vitals reviewed. Constitutional: He is oriented to person, place, and time. He appears well-developed and well-nourished. No distress.  HENT:  Head: Normocephalic and atraumatic.  Mouth/Throat: Oropharynx is clear and moist.  Eyes: Conjunctivae and EOM are normal. Pupils are equal, round, and reactive to light.  Neck: Normal range of motion. No JVD present.  Cardiovascular:  Normal rate, regular rhythm and intact distal pulses.   Pulmonary/Chest: Effort normal and breath sounds normal. No stridor. No respiratory distress. He has no wheezes. He has no rales. He exhibits no tenderness.  Abdominal: Soft. He exhibits no distension and no mass. There is no tenderness. There is no rebound and no guarding.  Musculoskeletal: Normal range of motion. He exhibits no edema.  Short short leg splint in place to left leg. Exposed upper calf with no tenderness. Exposed toes with brisk cap refill  Neurological: He is alert and oriented to person, place, and time.  Skin:     Psychiatric: He has a normal mood  and affect.    ED Course  Procedures (including critical care time)  CRITICAL CARE Performed by: Wynetta Emery   Total critical care time: 50 Critical care time was exclusive of separately billable procedures and treating other patients.  Critical care was necessary to treat or prevent imminent or life-threatening deterioration.  Critical care was time spent personally by me on the following activities: development of treatment plan with patient and/or surrogate as well as nursing, discussions with consultants, evaluation of patient's response to treatment, examination of patient, obtaining history from patient or surrogate, ordering and performing treatments and interventions, ordering and review of laboratory studies, ordering and review of radiographic studies, pulse oximetry and re-evaluation of patient's condition.  Labs Review Labs Reviewed  CBC WITH DIFFERENTIAL - Abnormal; Notable for the following:    WBC 10.6 (*)    RBC 3.92 (*)    HCT 37.2 (*)    Neutrophils Relative % 79 (*)    Neutro Abs 8.3 (*)    All other components within normal limits  COMPREHENSIVE METABOLIC PANEL - Abnormal; Notable for the following:    Glucose, Bld 119 (*)    Albumin 3.4 (*)    All other components within normal limits  TROPONIN I - Abnormal; Notable for the following:    Troponin I 1.37 (*)    All other components within normal limits  I-STAT TROPOININ, ED - Abnormal; Notable for the following:    Troponin i, poc 0.31 (*)    All other components within normal limits  I-STAT CHEM 8, ED - Abnormal; Notable for the following:    Sodium 136 (*)    Glucose, Bld 120 (*)    Hemoglobin 12.9 (*)    HCT 38.0 (*)    All other components within normal limits  PROTIME-INR    Imaging Review Dg Chest Port 1 View  06/19/2014   CLINICAL DATA:  Left chest pain.  Status post ankle surgery today.  EXAM: PORTABLE CHEST - 1 VIEW  COMPARISON:  06/12/2014.  FINDINGS: Normal sized heart. Interval  small amount of linear density in the right lower lung and left mid lung zone. Otherwise, clear lungs with normal vascularity. Unremarkable bones.  IMPRESSION: Small amount of interval bilateral linear atelectasis. Otherwise, unremarkable examination.   Electronically Signed   By: Gordan Payment M.D.   On: 06/19/2014 18:53     EKG Interpretation   Date/Time:  Thursday June 19 2014 17:24:00 EDT Ventricular Rate:  91 PR Interval:  159 QRS Duration: 104 QT Interval:  368 QTC Calculation: 453 R Axis:   51 Text Interpretation:  Sinus rhythm inferior Q waves are new compared to  Jun 12 2014 Confirmed by Criss Alvine  MD, SCOTT [phone]) on 06/19/2014 7:40:27  PM      MDM   Final diagnoses:  Pain  NSTEMI (non-ST elevated myocardial  infarction)  Hyperlipidemia  Essential hypertension  History of tobacco use  Ischemic chest pain  Abnormal ECG  Acute Q wave myocardial infarction  Recent major surgery    Filed Vitals:   06/19/14 1845 06/19/14 1846 06/19/14 1900 06/19/14 1915  BP: 136/64 136/64 136/66 133/63  Pulse:  90 83 86  Temp:      TempSrc:      Resp:  19 19 14   Height:      Weight:      SpO2:  99% 100% 99%    Medications  aspirin chewable tablet 324 mg (324 mg Oral Given 06/19/14 1843)  0.9 %  sodium chloride infusion ( Intravenous New Bag/Given 06/19/14 2048)    Maurine SimmeringLeon W Daleen SquibbWall is a 68 y.o. male presenting with chest pain and shortness of breath status post ORIF this a.m. Chest pain is concerning for CAD type origin. Less likely PE. Patient has a heart score of 7. EKG new Q waves in leads 3 and aVR, patient has a deepening of the R waves in the anterior leads. I-STAT troponin is elevated at 0.31. Will call cardiology for admission, attending physician has discussed heparinization with orthopedist, from their perspective they would prefer that he was not heparinize based on the recent surgery however they understand that it may be necessary.  Repeat page to cardiology at  6:35  Spoke to cardiologist Dr. Purvis SheffieldKoneswaran: He recommends obtaining a serum troponin and holding off on heparinization if the lab troponin is minimally elevated which he has defined is around 1.   745, reevaluated the patient, he remains chest pain-free. He has been evaluated by cardiology PA. Stressed the importance of alerting staff of any change in pain, nausea vomiting or shortness of breath.  Labs troponin has come back elevated at 1.37, will further discussed heperanization with  Dr. Purvis SheffieldKoneswaran at 7:50: he is going to evaluate the patient and   8:30 PM patient remains chest pain-free. Patient made n.p.o. for possible catheterization.  8:50 PM updated by Purvis SheffieldKoneswaran: And shared decision-making capacity they have decided to heparinize, obtain echo in the morning and possible catheterization depending on how troponins trend.  Wynetta Emeryicole Josaiah Muhammed, PA-C 06/19/14 2055

## 2014-06-20 ENCOUNTER — Encounter (HOSPITAL_COMMUNITY)
Admission: EM | Disposition: A | Discharge status: Home / Self Care | Payer: Medicare Other | Source: Home / Self Care | Attending: Cardiovascular Disease

## 2014-06-20 ENCOUNTER — Encounter (HOSPITAL_COMMUNITY): Payer: Self-pay

## 2014-06-20 DIAGNOSIS — I472 Ventricular tachycardia: Secondary | ICD-10-CM | POA: Diagnosis not present

## 2014-06-20 DIAGNOSIS — Z8249 Family history of ischemic heart disease and other diseases of the circulatory system: Secondary | ICD-10-CM | POA: Diagnosis not present

## 2014-06-20 DIAGNOSIS — I214 Non-ST elevation (NSTEMI) myocardial infarction: Secondary | ICD-10-CM | POA: Diagnosis present

## 2014-06-20 DIAGNOSIS — I2510 Atherosclerotic heart disease of native coronary artery without angina pectoris: Secondary | ICD-10-CM

## 2014-06-20 DIAGNOSIS — I10 Essential (primary) hypertension: Secondary | ICD-10-CM | POA: Diagnosis present

## 2014-06-20 DIAGNOSIS — R739 Hyperglycemia, unspecified: Secondary | ICD-10-CM | POA: Diagnosis not present

## 2014-06-20 DIAGNOSIS — E785 Hyperlipidemia, unspecified: Secondary | ICD-10-CM | POA: Diagnosis present

## 2014-06-20 DIAGNOSIS — R079 Chest pain, unspecified: Secondary | ICD-10-CM | POA: Diagnosis present

## 2014-06-20 DIAGNOSIS — I255 Ischemic cardiomyopathy: Secondary | ICD-10-CM | POA: Diagnosis present

## 2014-06-20 DIAGNOSIS — R072 Precordial pain: Secondary | ICD-10-CM

## 2014-06-20 DIAGNOSIS — Z87891 Personal history of nicotine dependence: Secondary | ICD-10-CM | POA: Diagnosis not present

## 2014-06-20 HISTORY — PX: CARDIAC CATHETERIZATION: SHX172

## 2014-06-20 HISTORY — PX: LEFT HEART CATHETERIZATION WITH CORONARY ANGIOGRAM: SHX5451

## 2014-06-20 LAB — BASIC METABOLIC PANEL
Anion gap: 14 (ref 5–15)
BUN: 15 mg/dL (ref 6–23)
CO2: 25 mEq/L (ref 19–32)
Calcium: 8.3 mg/dL — ABNORMAL LOW (ref 8.4–10.5)
Chloride: 100 mEq/L (ref 96–112)
Creatinine, Ser: 0.8 mg/dL (ref 0.50–1.35)
GFR calc Af Amer: >90 mL/min (ref >90)
Glucose, Bld: 164 mg/dL — ABNORMAL HIGH (ref 70–99)
POTASSIUM: 4.2 meq/L (ref 3.7–5.3)
Sodium: 139 mEq/L (ref 137–147)

## 2014-06-20 LAB — TROPONIN I
TROPONIN I: 5.54 ng/mL — AB (ref <0.30)
Troponin I: 6.44 ng/mL (ref <0.30)
Troponin I: 5.23 ng/mL (ref <0.30)

## 2014-06-20 LAB — LIPID PANEL
Cholesterol: 100 mg/dL (ref 0–200)
HDL: 31 mg/dL — ABNORMAL LOW (ref >39)
LDL CALC: 49 mg/dL (ref 0–99)
Total CHOL/HDL Ratio: 3.2 RATIO
Triglycerides: 99 mg/dL (ref <150)
VLDL: 20 mg/dL (ref 0–40)

## 2014-06-20 LAB — CBC
HCT: 36.5 % — ABNORMAL LOW (ref 39.0–52.0)
Hemoglobin: 12.5 g/dL — ABNORMAL LOW (ref 13.0–17.0)
MCH: 32.7 pg (ref 26.0–34.0)
MCHC: 34.2 g/dL (ref 30.0–36.0)
MCV: 95.5 fL (ref 78.0–100.0)
PLATELETS: 262 10*3/uL (ref 150–400)
RBC: 3.82 MIL/uL — ABNORMAL LOW (ref 4.22–5.81)
RDW: 12.5 % (ref 11.5–15.5)
WBC: 8.8 10*3/uL (ref 4.0–10.5)

## 2014-06-20 LAB — POCT ACTIVATED CLOTTING TIME: ACTIVATED CLOTTING TIME: 433 s

## 2014-06-20 LAB — HEPARIN LEVEL (UNFRACTIONATED)

## 2014-06-20 LAB — MRSA PCR SCREENING: MRSA by PCR: NEGATIVE

## 2014-06-20 SURGERY — LEFT HEART CATHETERIZATION WITH CORONARY ANGIOGRAM
Anesthesia: LOCAL | Site: Hand | Laterality: Right

## 2014-06-20 MED ORDER — SODIUM CHLORIDE 0.9 % IJ SOLN: 3.0000 mL | Freq: Two times a day (BID) | INTRAMUSCULAR | Status: DC

## 2014-06-20 MED ORDER — LIDOCAINE HCL (PF) 1 % IJ SOLN
INTRAMUSCULAR | Status: AC
  Filled 2014-06-20: qty 30

## 2014-06-20 MED ORDER — ASPIRIN 81 MG PO CHEW
CHEWABLE_TABLET | ORAL | Status: AC
  Filled 2014-06-20: qty 1

## 2014-06-20 MED ORDER — SODIUM CHLORIDE 0.9 % IV SOLN: 250.0000 mL | INTRAVENOUS | Status: DC | PRN

## 2014-06-20 MED ORDER — SODIUM CHLORIDE 0.9 % IV SOLN
INTRAVENOUS | Status: DC
  Administered 2014-06-20: 08:00:00 via INTRAVENOUS

## 2014-06-20 MED ORDER — PRASUGREL HCL 10 MG PO TABS
ORAL_TABLET | ORAL | Status: AC
  Filled 2014-06-20: qty 1

## 2014-06-20 MED ORDER — OXYCODONE HCL 5 MG PO TABS
5.0000 mg | ORAL_TABLET | Freq: Once | ORAL | Status: AC
  Administered 2014-06-20: 5 mg via ORAL
  Filled 2014-06-20: qty 1

## 2014-06-20 MED ORDER — HEART ATTACK BOUNCING BOOK
Freq: Once | Status: DC
  Filled 2014-06-20: qty 1

## 2014-06-20 MED ORDER — HEPARIN SODIUM (PORCINE) 1000 UNIT/ML IJ SOLN
INTRAMUSCULAR | Status: AC
Start: 2014-06-20 — End: 2014-06-20
  Filled 2014-06-20: qty 1

## 2014-06-20 MED ORDER — BIVALIRUDIN 250 MG IV SOLR
INTRAVENOUS | Status: AC
  Filled 2014-06-20: qty 250

## 2014-06-20 MED ORDER — SODIUM CHLORIDE 0.9 % IV SOLN
1.0000 mL/kg/h | INTRAVENOUS | Status: AC
  Administered 2014-06-20: 17:00:00 1 mL/kg/h via INTRAVENOUS

## 2014-06-20 MED ORDER — MORPHINE SULFATE 2 MG/ML IJ SOLN
2.0000 mg | Freq: Once | INTRAMUSCULAR | Status: AC
  Administered 2014-06-20: 2 mg via INTRAVENOUS
  Filled 2014-06-20: qty 1

## 2014-06-20 MED ORDER — METOPROLOL TARTRATE 25 MG PO TABS
25.0000 mg | ORAL_TABLET | Freq: Two times a day (BID) | ORAL | Status: DC
  Administered 2014-06-20 – 2014-06-21 (×2): 25 mg via ORAL
  Filled 2014-06-20 (×3): qty 1

## 2014-06-20 MED ORDER — SODIUM CHLORIDE 0.9 % IJ SOLN: 3.0000 mL | INTRAMUSCULAR | Status: DC | PRN

## 2014-06-20 MED ORDER — FENTANYL CITRATE 0.05 MG/ML IJ SOLN
INTRAMUSCULAR | Status: AC
  Filled 2014-06-20: qty 2

## 2014-06-20 MED ORDER — NITROGLYCERIN 1 MG/10 ML FOR IR/CATH LAB
INTRA_ARTERIAL | Status: AC
  Filled 2014-06-20: qty 10

## 2014-06-20 MED ORDER — INFLUENZA VAC SPLIT QUAD 0.5 ML IM SUSY
0.5000 mL | PREFILLED_SYRINGE | INTRAMUSCULAR | Status: AC
  Administered 2014-06-21: 10:00:00 0.5 mL via INTRAMUSCULAR
  Filled 2014-06-20: qty 0.5

## 2014-06-20 MED ORDER — HEPARIN (PORCINE) IN NACL 2-0.9 UNIT/ML-% IJ SOLN
INTRAMUSCULAR | Status: AC
  Filled 2014-06-20: qty 1500

## 2014-06-20 MED ORDER — MIDAZOLAM HCL 2 MG/2ML IJ SOLN
INTRAMUSCULAR | Status: AC
  Filled 2014-06-20: qty 2

## 2014-06-20 MED ORDER — VERAPAMIL HCL 2.5 MG/ML IV SOLN
INTRAVENOUS | Status: AC
  Filled 2014-06-20: qty 2

## 2014-06-20 MED ORDER — ASPIRIN 81 MG PO CHEW
81.0000 mg | CHEWABLE_TABLET | ORAL | Status: AC
  Administered 2014-06-20: 81 mg via ORAL
  Filled 2014-06-20: qty 1

## 2014-06-20 MED ORDER — PRASUGREL HCL 10 MG PO TABS
10.0000 mg | ORAL_TABLET | Freq: Every day | ORAL | Status: DC
  Administered 2014-06-21: 10 mg via ORAL
  Filled 2014-06-20 (×2): qty 1

## 2014-06-20 MED ORDER — SODIUM CHLORIDE 0.9 % IJ SOLN
3.0000 mL | Freq: Two times a day (BID) | INTRAMUSCULAR | Status: DC
  Administered 2014-06-20: 3 mL via INTRAVENOUS

## 2014-06-20 NOTE — Progress Notes (Signed)
06/20/2014 0830  To cath lab  Abrea Henle, Linnell Fulling

## 2014-06-20 NOTE — Interval H&P Note (Signed)
History and Physical Interval Note:  06/20/2014 8:52 AM  Adam Rowland  has presented today for surgery, with the diagnosis of NSTEMI  The various methods of treatment have been discussed with the patient and family. After consideration of risks, benefits and other options for treatment, the patient has consented to  Procedure(s): LEFT HEART CATHETERIZATION WITH CORONARY ANGIOGRAM (N/A) as a surgical intervention .  The patient's history has been reviewed, patient examined, no change in status, stable for surgery.  I have reviewed the patient's chart and labs.  Questions were answered to the patient's satisfaction.    Cath Lab Visit (complete for each Cath Lab visit)  Clinical Evaluation Leading to the Procedure:   ACS: Yes.    Non-ACS:    Anginal Classification: CCS IV  Anti-ischemic medical therapy: No Therapy  Non-Invasive Test Results: No non-invasive testing performed  Prior CABG: No previous CABG       Theron Arista [facility] 06/20/2014 8:52 AM

## 2014-06-20 NOTE — Progress Notes (Signed)
  Echocardiogram 2D Echocardiogram has been performed.  Kilah Drahos FRANCES 06/20/2014, 3:47 PM

## 2014-06-20 NOTE — H&P (View-Only) (Signed)
PROGRESS NOTE  Subjective:   [name] is a 68 yo , recent left ankle fx. Developed CP . Troponins c/w NSTEMI Transferred to [place] Scheduled for cath this am.   Objective:    Vital Signs:   Temp:  [98.2 F (36.8 C)-101.2 F (38.4 C)] 98.2 F (36.8 C) (10/23 0725) Pulse Rate:  [81-98] 88 (10/23 0700) Resp:  [14-27] 21 (10/23 0700) BP: (107-142)/(53-72) 136/64 mmHg (10/23 0700) SpO2:  [95 %-100 %] 96 % (10/23 0700) Weight:  [185 lb (83.915 kg)] 185 lb (83.915 kg) (10/22 1801)  Last BM Date: 06/17/14   24-hour weight change: Weight change:   Weight trends: Filed Weights   06/19/14 1801  Weight: 185 lb (83.915 kg)    Intake/Output:  10/22 0701 - 10/23 0700 In: 100.9 [I.V.:100.9] Out: 825 [Urine:825] Total I/O In: -  Out: 300 [Urine:300]   Physical Exam: BP 136/64  Pulse 88  Temp(Src) 98.2 F (36.8 C) (Oral)  Resp 21  Ht 5' 8.9" (1.75 m)  Wt 185 lb (83.915 kg)  BMI 27.40 kg/m2  SpO2 96%  Wt Readings from Last 3 Encounters:  06/19/14 185 lb (83.915 kg)  06/19/14 185 lb (83.915 kg)  01/16/14 184 lb (83.462 kg)    General: Vital signs reviewed and noted.  Head: Normocephalic, atraumatic.  Eyes: conjunctivae/corneas clear.  EOM's intact.   Throat: normal  Neck:  normal   Lungs:    clear  Heart:  RR, S1S2  Abdomen:  Soft, non-tender, non-distended    Extremities: Great radial pulses and femoral pulses   Neurologic: A&O X3, CN II - XII are grossly intact.   Psych: Normal     Labs: BMET:  Recent Labs  06/19/14 1738 06/19/14 1755 06/20/14 0210  NA 139 136* 139  K 4.5 4.1 4.2  CL 100 101 100  CO2 27  --  25  GLUCOSE 119* 120* 164*  BUN 14 14 15   CREATININE 0.78 0.80 0.80  CALCIUM 8.8  --  8.3*    Liver function tests:  Recent Labs  06/19/14 1738  AST 19  ALT 18  ALKPHOS 60  BILITOT 1.1  PROT 6.8  ALBUMIN 3.4*   No results found for this basename: LIPASE, AMYLASE,  in the last 72 hours  CBC:  Recent Labs  06/19/14 1738 06/19/14 1755 06/20/14 0210  WBC 10.6*  --  8.8  NEUTROABS 8.3*  --   --   HGB 13.0 12.9* 12.5*  HCT 37.2* 38.0* 36.5*  MCV 94.9  --  95.5  PLT 266  --  262    Cardiac Enzymes:  Recent Labs  06/19/14 1800 06/20/14 0040 06/20/14 0210  TROPONINI 1.37* 5.54* 6.44*    Coagulation Studies:  Recent Labs  06/19/14 2200  LABPROT 14.9  INR 1.16    Other: No components found with this basename: POCBNP,  No results found for this basename: DDIMER,  in the last 72 hours No results found for this basename: HGBA1C,  in the last 72 hours  Recent Labs  06/20/14 0210  CHOL 100  HDL 31*  LDLCALC 49  TRIG 99  CHOLHDL 3.2   No results found for this basename: TSH, T4TOTAL, FREET3, T3FREE, THYROIDAB,  in the last 72 hours No results found for this basename: VITAMINB12, FOLATE, FERRITIN, TIBC, IRON, RETICCTPCT,  in the last 72 hours   Other results:  EKG :  NSR, inf. Q waves, NS ST changes in anterior lateral leads.  Medications:  Infusions: . heparin 1,300 Units/hr (06/20/14 0432)    Scheduled Medications: . aspirin EC  81 mg Oral Daily  . atorvastatin  40 mg Oral Daily  . [START ON 06/21/2014] Influenza vac split quadrivalent PF  0.5 mL Intramuscular Tomorrow-1000  . lisinopril  10 mg Oral Daily  . metoprolol tartrate  12.5 mg Oral BID    Assessment/ Plan:   Active Problems:   NSTEMI (non-ST elevated myocardial infarction) 1. CAD:  Patient has + Troponin levels, free of CP Scheduled for cath i discussed risks ( bleeding, CVA, MI, perforation, need for emergent surgery, renal failure, infection, death) / options / benefits.   He understands and agrees to proceed.  LDL only 49 this am.  Should probably repeat in several weeks.   Disposition: for cath today.`  Length of Stay: 1  Vesta Mixer, Montez Hageman., MD, [facility] 06/20/2014, 7:56 AM Office [phone] Pager [phone]

## 2014-06-20 NOTE — Progress Notes (Signed)
06/20/2014 8:41 AM Cell phone and glasses given to wife Drequan Ironside, Linnell Fulling

## 2014-06-20 NOTE — Progress Notes (Signed)
TR BAND REMOVAL  LOCATION:    right radial  DEFLATED PER PROTOCOL:    Yes.    TIME BAND OFF / DRESSING APPLIED:    1330   SITE UPON ARRIVAL:    Level 0  SITE AFTER BAND REMOVAL:    Level 0  REVERSE ALLEN'S TEST:     positive  CIRCULATION SENSATION AND MOVEMENT:    Within Normal Limits   Yes.    COMMENTS:   Tolerated procedure well 

## 2014-06-20 NOTE — Progress Notes (Signed)
ANTICOAGULATION CONSULT NOTE - Follow Up Consult  Pharmacy Consult for heparin Indication: NSTEMI  Labs:  Recent Labs  06/19/14 1738 06/19/14 1755 06/19/14 1800 06/19/14 2200 06/20/14 0040 06/20/14 0210  HGB 13.0 12.9*  --   --   --  12.5*  HCT 37.2* 38.0*  --   --   --  36.5*  PLT 266  --   --   --   --  262  LABPROT  --   --   --  14.9  --   --   INR  --   --   --  1.16  --   --   HEPARINUNFRC  --   --   --   --   --  <0.10*  CREATININE 0.78 0.80  --   --   --  0.80  TROPONINI  --   --  1.37*  --  5.54*  --      Assessment: 68yo male undetectable on heparin with initial dosing for NSTEMI.  Goal of Therapy:  Heparin level 0.3-0.7 units/ml   Plan:  Will increase heparin gtt by 3-4 units/kg/hr to 1300 units/hr and check level in 6hr.  Vernard Gambles, PharmD, BCPS  06/20/2014,4:32 AM

## 2014-06-20 NOTE — Care Management Note (Addendum)
    Page 1 of 1   06/22/2014     9:46:26 AM CARE MANAGEMENT NOTE 06/22/2014  Patient:  Adam Rowland, Adam Rowland   Account Number:  [id number]  Date Initiated:  06/20/2014  Documentation initiated by:  Elissa Hefty  Subjective/Objective Assessment:   adm w nstemi     Action/Plan:   lives w wife, pcp dr Timmothy Sours Laurance Flatten   Anticipated DC Date:  06/21/2014   Anticipated DC Plan:  [place]  CM consult  Medication Assistance      Choice offered to / List presented to:             Status of service:  Completed, signed off Medicare Important Message given?   (If response is "NO", the following Medicare IM given date fields will be blank) Date Medicare IM given:   Medicare IM given by:   Date Additional Medicare IM given:   Additional Medicare IM given by:    Discharge Disposition:  HOME/SELF CARE  Per UR Regulation:  Reviewed for med. necessity/level of care/duration of stay  If discussed at [place] of Stay Meetings, dates discussed:    Comments:  06/21/14 10:00 Cm met with pt in room and gave pt 30 day free trial card for Effient.  Pt verbzlized understanding the 30 days will gvie hime enough time to have the office staff at his follow up Md to get the medications pre-authorized for refills.  No other CM needs were communicated.  Mariane Masters, BSN, [place].

## 2014-06-20 NOTE — Progress Notes (Signed)
    PROGRESS NOTE  Subjective:   Adam Rowland is a 68 yo , recent left ankle fx. Developed CP . Troponins c/w NSTEMI Transferred to [place] Scheduled for cath this am.   Objective:    Vital Signs:   Temp:  [98.2 F (36.8 C)-101.2 F (38.4 C)] 98.2 F (36.8 C) (10/23 0725) Pulse Rate:  [81-98] 88 (10/23 0700) Resp:  [14-27] 21 (10/23 0700) BP: (107-142)/(53-72) 136/64 mmHg (10/23 0700) SpO2:  [95 %-100 %] 96 % (10/23 0700) Weight:  [185 lb (83.915 kg)] 185 lb (83.915 kg) (10/22 1801)  Last BM Date: 06/17/14   24-hour weight change: Weight change:   Weight trends: Filed Weights   06/19/14 1801  Weight: 185 lb (83.915 kg)    Intake/Output:  10/22 0701 - 10/23 0700 In: 100.9 [I.V.:100.9] Out: 825 [Urine:825] Total I/O In: -  Out: 300 [Urine:300]   Physical Exam: BP 136/64  Pulse 88  Temp(Src) 98.2 F (36.8 C) (Oral)  Resp 21  Ht 5' 8.9" (1.75 m)  Wt 185 lb (83.915 kg)  BMI 27.40 kg/m2  SpO2 96%  Wt Readings from Last 3 Encounters:  06/19/14 185 lb (83.915 kg)  06/19/14 185 lb (83.915 kg)  01/16/14 184 lb (83.462 kg)    General: Vital signs reviewed and noted.  Head: Normocephalic, atraumatic.  Eyes: conjunctivae/corneas clear.  EOM's intact.   Throat: normal  Neck:  normal   Lungs:    clear  Heart:  RR, S1S2  Abdomen:  Soft, non-tender, non-distended    Extremities: Great radial pulses and femoral pulses   Neurologic: A&O X3, CN II - XII are grossly intact.   Psych: Normal     Labs: BMET:  Recent Labs  06/19/14 1738 06/19/14 1755 06/20/14 0210  NA 139 136* 139  K 4.5 4.1 4.2  CL 100 101 100  CO2 27  --  25  GLUCOSE 119* 120* 164*  BUN 14 14 15  CREATININE 0.78 0.80 0.80  CALCIUM 8.8  --  8.3*    Liver function tests:  Recent Labs  06/19/14 1738  AST 19  ALT 18  ALKPHOS 60  BILITOT 1.1  PROT 6.8  ALBUMIN 3.4*   No results found for this basename: LIPASE, AMYLASE,  in the last 72 hours  CBC:  Recent Labs  06/19/14 1738 06/19/14 1755 06/20/14 0210  WBC 10.6*  --  8.8  NEUTROABS 8.3*  --   --   HGB 13.0 12.9* 12.5*  HCT 37.2* 38.0* 36.5*  MCV 94.9  --  95.5  PLT 266  --  262    Cardiac Enzymes:  Recent Labs  06/19/14 1800 06/20/14 0040 06/20/14 0210  TROPONINI 1.37* 5.54* 6.44*    Coagulation Studies:  Recent Labs  06/19/14 2200  LABPROT 14.9  INR 1.16    Other: No components found with this basename: POCBNP,  No results found for this basename: DDIMER,  in the last 72 hours No results found for this basename: HGBA1C,  in the last 72 hours  Recent Labs  06/20/14 0210  CHOL 100  HDL 31*  LDLCALC 49  TRIG 99  CHOLHDL 3.2   No results found for this basename: TSH, T4TOTAL, FREET3, T3FREE, THYROIDAB,  in the last 72 hours No results found for this basename: VITAMINB12, FOLATE, FERRITIN, TIBC, IRON, RETICCTPCT,  in the last 72 hours   Other results:  EKG :  NSR, inf. Q waves, NS ST changes in anterior lateral leads.  Medications:      Infusions: . heparin 1,300 Units/hr (06/20/14 0432)    Scheduled Medications: . aspirin EC  81 mg Oral Daily  . atorvastatin  40 mg Oral Daily  . [START ON 06/21/2014] Influenza vac split quadrivalent PF  0.5 mL Intramuscular Tomorrow-1000  . lisinopril  10 mg Oral Daily  . metoprolol tartrate  12.5 mg Oral BID    Assessment/ Plan:   Active Problems:   NSTEMI (non-ST elevated myocardial infarction) 1. CAD:  Patient has + Troponin levels, free of CP Scheduled for cath i discussed risks ( bleeding, CVA, MI, perforation, need for emergent surgery, renal failure, infection, death) / options / benefits.   He understands and agrees to proceed.  LDL only 49 this am.  Should probably repeat in several weeks.   Disposition: for cath today.`  Length of Stay: 1  Vesta Mixer, Montez Hageman., MD, [facility] 06/20/2014, 7:56 AM Office [phone] Pager [phone]

## 2014-06-20 NOTE — CV Procedure (Signed)
    Cardiac Catheterization Procedure Note  Name: Adam Rowland MRN: [id number] DOB: 05-28-1946  Procedure: Left Heart Cath, Selective Coronary Angiography, LV angiography, PTCA and stenting of the LAD  Indication: 68 yo WM with history of HTN and hyperlipidemia presents with NSTEMI following ankle surgery.  Procedural Details:  The right wrist was prepped, draped, and anesthetized with 1% lidocaine. Using the modified Seldinger technique, a 6 French slender sheath was introduced into the right radial artery. 3 mg of verapamil was administered through the sheath, weight-based unfractionated heparin was administered intravenously. Standard Judkins catheters were used for selective coronary angiography and left ventriculography. Catheter exchanges were performed over an exchange length guidewire.  PROCEDURAL FINDINGS Hemodynamics: AO 121/59 mean 88 mm Hg LV 117/13 mm Hg   Coronary angiography: Coronary dominance: right  Left mainstem: Normal  Left anterior descending (LAD): The LAD is heavily calcified. The mid LAD has a focal 99% stenosis in a long segment of disease. The first diagonal has a 90% ostial stenosis.   Left circumflex (LCx): The LCx gives rise to a large OM1 then terminates in a second OM. There is segmental disease in the mid vessel up to 80%. The first OM also has segmental disease to 80%.   Right coronary artery (RCA): There is 30% narrowing of the proximal and distal RCA. The PDA has 40-50% disease proximally.  Left ventriculography: Left ventricular systolic function is abnormal, LVEF is estimated at 40-45% with mid to distal anterolateral and apical severe hypokinesis.There is no significant mitral regurgitation   PCI Note:  Following the diagnostic procedure, the decision was made to proceed with PCI of the culprit lesion in the mid LAD.  Weight-based bivalirudin was given for anticoagulation. Effient 60 mg was given orally. Once a therapeutic ACT was achieved, a 6  [place] XBLAD 3.5 guide catheter was inserted.  A prowater coronary guidewire was used to cross the lesion.  The lesion was predilated with a 2.5 mm balloon. We were unable to pass the stent at this point and the lesion was predilated with a 3.0 mm noncompliant balloon.   The lesion was then stented with a 3.0 x 23 mm Xience Alpine stent.  The stent was postdilated with a 3.25 mm  noncompliant balloon.  Following PCI, there was 0% residual stenosis and TIMI-3 flow. Final angiography confirmed an excellent result. The patient tolerated the procedure well. There were no immediate procedural complications. A TR band was used for radial hemostasis. The patient was transferred to the post catheterization recovery area for further monitoring.  PCI Data: Vessel - LAD/Segment - mid  Percent Stenosis (pre)  99% TIMI-flow 3 Stent 3.0 x 23 mm Xience Alpine Percent Stenosis (post) 0% TIMI-flow (post) 3  Final Conclusions:   1. 2 vessel obstructive CAD. 2. Moderate LV dysfunction.  3. Successful stenting of the mid LAD with DES.  Recommendations:  DAPT for one year. Will continue ACEi and beta blocker. Continue statin therapy. After a period of healing from his MI would consider PCI of the Mid LCx and OM1.   Myrical Andujo [place], MDFACC 06/20/2014, 9:53 AM

## 2014-06-20 NOTE — Progress Notes (Signed)
Level of care discussed with Dr. Ace Gins and pt to remain in ICU for the night. Reassess in the am.

## 2014-06-21 ENCOUNTER — Telehealth: Payer: Self-pay | Admitting: Cardiovascular Disease

## 2014-06-21 ENCOUNTER — Encounter (HOSPITAL_COMMUNITY): Payer: Self-pay | Admitting: Nurse Practitioner

## 2014-06-21 DIAGNOSIS — I2510 Atherosclerotic heart disease of native coronary artery without angina pectoris: Secondary | ICD-10-CM

## 2014-06-21 DIAGNOSIS — I214 Non-ST elevation (NSTEMI) myocardial infarction: Secondary | ICD-10-CM | POA: Diagnosis not present

## 2014-06-21 DIAGNOSIS — I472 Ventricular tachycardia: Secondary | ICD-10-CM

## 2014-06-21 DIAGNOSIS — I4729 Other ventricular tachycardia: Secondary | ICD-10-CM

## 2014-06-21 DIAGNOSIS — I255 Ischemic cardiomyopathy: Secondary | ICD-10-CM

## 2014-06-21 LAB — CBC
HEMATOCRIT: 33.2 % — AB (ref 39.0–52.0)
HEMOGLOBIN: 11.8 g/dL — AB (ref 13.0–17.0)
MCH: 33.2 pg (ref 26.0–34.0)
MCHC: 35.5 g/dL (ref 30.0–36.0)
MCV: 93.5 fL (ref 78.0–100.0)
Platelets: 250 10*3/uL (ref 150–400)
RBC: 3.55 MIL/uL — AB (ref 4.22–5.81)
RDW: 12.3 % (ref 11.5–15.5)
WBC: 9.6 10*3/uL (ref 4.0–10.5)

## 2014-06-21 LAB — BASIC METABOLIC PANEL
ANION GAP: 11 (ref 5–15)
BUN: 13 mg/dL (ref 6–23)
CALCIUM: 8.4 mg/dL (ref 8.4–10.5)
CHLORIDE: 105 meq/L (ref 96–112)
CO2: 21 meq/L (ref 19–32)
Creatinine, Ser: 0.76 mg/dL (ref 0.50–1.35)
GFR calc Af Amer: >90 mL/min (ref >90)
GFR calc non Af Amer: >90 mL/min (ref >90)
GLUCOSE: 132 mg/dL — AB (ref 70–99)
POTASSIUM: 4.3 meq/L (ref 3.7–5.3)
SODIUM: 137 meq/L (ref 137–147)

## 2014-06-21 MED ORDER — PRASUGREL HCL 10 MG PO TABS: 10.0000 mg | ORAL_TABLET | Freq: Every day | ORAL | Status: DC

## 2014-06-21 MED ORDER — LISINOPRIL 10 MG PO TABS: ORAL_TABLET | ORAL | Status: DC

## 2014-06-21 MED ORDER — METOPROLOL TARTRATE 25 MG PO TABS
25.0000 mg | ORAL_TABLET | Freq: Two times a day (BID) | ORAL | Status: DC
Start: 2014-06-21 — End: 2014-07-21

## 2014-06-21 MED ORDER — NITROGLYCERIN 0.4 MG SL SUBL: 0.4000 mg | SUBLINGUAL_TABLET | SUBLINGUAL | Status: DC | PRN

## 2014-06-21 MED ORDER — ASPIRIN 81 MG PO TBEC: 81.0000 mg | DELAYED_RELEASE_TABLET | Freq: Every day | ORAL | Status: DC

## 2014-06-21 NOTE — Discharge Instructions (Signed)
**  PLEASE REMEMBER TO BRING ALL OF YOUR MEDICATIONS TO EACH OF YOUR FOLLOW-UP OFFICE VISITS. ° °NO HEAVY LIFTING X 2 WEEKS. °NO SEXUAL ACTIVITY X 2 WEEKS. °NO DRIVING X 1 WEEK. °NO SOAKING BATHS, HOT TUBS, POOLS, ETC., X 7 DAYS. ° °Radial Site Care °Refer to this sheet in the next few weeks. These instructions provide you with information on caring for yourself after your procedure. Your caregiver may also give you more specific instructions. Your treatment has been planned according to current medical practices, but problems sometimes occur. Call your caregiver if you have any problems or questions after your procedure. °HOME CARE INSTRUCTIONS °· You may shower the day after the procedure. Remove the bandage (dressing) and gently wash the site with plain soap and water. Gently pat the site dry.  °· Do not apply powder or lotion to the site.  °· Do not submerge the affected site in water for 3 to 5 days.  °· Inspect the site at least twice daily.  °· Do not flex or bend the affected arm for 24 hours.  °· No lifting over 5 pounds (2.3 kg) for 5 days after your procedure.  ° °What to expect: °· Any bruising will usually fade within 1 to 2 weeks.  °· Blood that collects in the tissue (hematoma) may be painful to the touch. It should usually decrease in size and tenderness within 1 to 2 weeks.  °SEEK IMMEDIATE MEDICAL CARE IF: °· You have unusual pain at the radial site.  °· You have redness, warmth, swelling, or pain at the radial site.  °· You have drainage (other than a small amount of blood on the dressing).  °· You have chills.  °· You have a fever or persistent symptoms for more than 72 hours.  °· You have a fever and your symptoms suddenly get worse.  °· Your arm becomes pale, cool, tingly, or numb.  °· You have heavy bleeding from the site. Hold pressure on the site.  ° °

## 2014-06-21 NOTE — Progress Notes (Signed)
[id number] Patient is non-weight bearing due to ankle surgery on 06/19/14 prior to MI. MI/PCI d/c education completed including risk factor modification, restrictions, CP, NTG use and calling 911, heart healthy diet sheet given. Discussed activity progression for once patient is cleared from an orthopedic standpoint to do weight bearing activity. Discussed Phase 2 cardiac rehab and permission given to send contact information to CR at [facility].  Artist Pais, MS, ACSM CCEP

## 2014-06-21 NOTE — Progress Notes (Addendum)
Tele showed 6 beats VT, pt asymptomatic.  BP 128/57.  Dr Chilton Si on call for cardiology informed, no orders received.

## 2014-06-21 NOTE — Telephone Encounter (Signed)
Hope you guys are well. Adam Rowland is a pt that Dr. Purvis Sheffield saw @ [facility] and will need a 7 day transition of care appt for next Friday 10/30 [name] says you guys can add to his schedule). B/C he's a transition of care, he'll need a phone call on Monday from a nurse to check in and confirm appt.   Thanks,   Thayer Ohm

## 2014-06-21 NOTE — Progress Notes (Signed)
Subjective: 1 Day Post-Op Procedure(s) (LRB): LEFT HEART CATHETERIZATION WITH CORONARY ANGIOGRAM (N/A) CORONARY STENT INTERVENTION (Right) 2 days post op ORIF L ankle fracture Patient reports pain as mild.    Objective: Vital signs in last 24 hours: Temp:  [98.5 F (36.9 C)-99.9 F (37.7 C)] 99.1 F (37.3 C) (10/24 1008) Pulse Rate:  [73-94] 94 (10/24 0736) Resp:  [18] 18 (10/24 0736) BP: (98-141)/(48-93) 124/58 mmHg (10/24 0736) SpO2:  [96 %-99 %] 98 % (10/24 0736) Weight:  [84.6 kg (186 lb 8.2 oz)] 84.6 kg (186 lb 8.2 oz) (10/24 0007)  Intake/Output from previous day: 10/23 0701 - 10/24 0700 In: 1429.2 [P.O.:680; I.V.:749.2] Out: 850 [Urine:850] Intake/Output this shift: Total I/O In: 220 [P.O.:220] Out: 300 [Urine:300]   Recent Labs  06/19/14 1738 06/19/14 1755 06/20/14 0210 06/21/14 0259  HGB 13.0 12.9* 12.5* 11.8*    Recent Labs  06/20/14 0210 06/21/14 0259  WBC 8.8 9.6  RBC 3.82* 3.55*  HCT 36.5* 33.2*  PLT 262 250    Recent Labs  06/20/14 0210 06/21/14 0259  NA 139 137  K 4.2 4.3  CL 100 105  CO2 25 21  BUN 15 13  CREATININE 0.80 0.76  GLUCOSE 164* 132*  CALCIUM 8.3* 8.4    Recent Labs  06/19/14 2200  INR 1.16    Sensation intact distally Incision: dressing C/D/I Compartment soft  Assessment/Plan: 1 Day Post-Op Procedure(s) (LRB): LEFT HEART CATHETERIZATION WITH CORONARY ANGIOGRAM (N/A) CORONARY STENT INTERVENTION (Right) 2 days post op L Pilon ankle fracture Up with therapy NWB LLE in splint, pt states he has a knee walker at home which is perfect with his upper extremity WB restrictions per cardiology Discharge home per med/card team I understand this will be later today Has pain rx already F/u planned with Dr. Renaye Rakers 07/02/14  Treyvin Glidden, Ivin Booty 06/21/2014, 10:24 AM

## 2014-06-21 NOTE — Progress Notes (Signed)
The patient was seen and examined, and I agree with the assessment and plan as documented above. Pt is feeling well and denies chest pain, SOB, and palpitations. ECG likely indicative of downstream microthrombi vs reperfusion pattern. EF 45-50%, on ACEI and beta blocker. He will be enrolled in cardiac rehabilitation. Ortho will see him prior to discharge given recent surgery. He will follow up with me in [place] this upcoming week. He and his wife are very appreciative of the care he has received.

## 2014-06-21 NOTE — Progress Notes (Signed)
Patient Name: Adam Rowland Date of Encounter: 06/21/2014   Principal Problem:   NSTEMI (non-ST elevated myocardial infarction) Active Problems:   CAD (coronary artery disease)   Hyperlipidemia   Hypertension   NSVT (nonsustained ventricular tachycardia)   Nephrolithiasis   SUBJECTIVE  No chest pain or sob overnight.  Eager to go home.  CURRENT MEDS . aspirin EC  81 mg Oral Daily  . atorvastatin  40 mg Oral Daily  . heart attack bouncing book   Does not apply Once  . Influenza vac split quadrivalent PF  0.5 mL Intramuscular Tomorrow-1000  . lisinopril  10 mg Oral Daily  . metoprolol tartrate  25 mg Oral BID  . prasugrel  10 mg Oral Daily  . sodium chloride  3 mL Intravenous Q12H    OBJECTIVE  Filed Vitals:   06/20/14 2020 06/21/14 0007 06/21/14 0141 06/21/14 0438  BP: 125/51 98/48 128/57 114/58  Pulse: 91 84  73  Temp: 99.9 F (37.7 C) 99.5 F (37.5 C)  99 F (37.2 C)  TempSrc: Oral Oral  Oral  Resp: 18 18  18   Height:      Weight:  186 lb 8.2 oz (84.6 kg)    SpO2: 98% 96%  96%    Intake/Output Summary (Last 24 hours) at 06/21/14 0721 Last data filed at 06/20/14 2200  Gross per 24 hour  Intake 1429.15 ml  Output    850 ml  Net 579.15 ml   Filed Weights   06/19/14 1801 06/21/14 0007  Weight: 185 lb (83.915 kg) 186 lb 8.2 oz (84.6 kg)    PHYSICAL EXAM  General: Pleasant, NAD. Neuro: Alert and oriented X 3. Moves all extremities spontaneously. Psych: Normal affect. HEENT:  Normal  Neck: Supple without bruits or JVD. Lungs:  Resp regular and unlabored, CTA. Heart: RRR no s3, s4.  Soft syst murmur @ rusb. Abdomen: Soft, non-tender, non-distended, BS + x 4.  Extremities: No clubbing, cyanosis or edema on right.  Cast to LLE.  DP/PT/Radials 2+ bilat UE/RLE.  R wrist w/o bleeding/bruit/hematoma.  Accessory Clinical Findings  CBC  Recent Labs  06/19/14 1738  06/20/14 0210 06/21/14 0259  WBC 10.6*  --  8.8 9.6  NEUTROABS 8.3*  --   --   --   HGB  13.0  < > 12.5* 11.8*  HCT 37.2*  < > 36.5* 33.2*  MCV 94.9  --  95.5 93.5  PLT 266  --  262 250  < > = values in this interval not displayed. Basic Metabolic Panel  Recent Labs  06/20/14 0210 06/21/14 0259  NA 139 137  K 4.2 4.3  CL 100 105  CO2 25 21  GLUCOSE 164* 132*  BUN 15 13  CREATININE 0.80 0.76  CALCIUM 8.3* 8.4   Liver Function Tests  Recent Labs  06/19/14 1738  AST 19  ALT 18  ALKPHOS 60  BILITOT 1.1  PROT 6.8  ALBUMIN 3.4*   Cardiac Enzymes  Recent Labs  06/20/14 0040 06/20/14 0210 06/20/14 1201  TROPONINI 5.54* 6.44* 5.23*   Fasting Lipid Panel  Recent Labs  06/20/14 0210  CHOL 100  HDL 31*  LDLCALC 49  TRIG 99  CHOLHDL 3.2    TELE  Rsr, 6 beats nsvt, pvc's, trigeminy  ECG  Rsr, 74, inf infarct, inflat twi - new since yesterday.  ASSESSMENT AND PLAN  1.  NSTEMI/CAD:  S/p PCI/DES to the LAD yesterday.  No chest pain overnight.  Trop trending down  as of yesterday afternoon.  New inflat TWI on ECG this morning.  Cont asa, statin, bb, acei, effient.  Cardiac rehab to see.  Will have to touch base with ortho to see what activity/weight bearing restrictions are.  He has residual LCX/OM1 dzs that will require intervention in a staged fashion in the future.  2.  ICM:  EF 45-50% by echo with mid-apicalanteroseptal AK and grade 2 DD.  Euvolemic on exam.  Cont bb/acei.  3.  HTN:  Stable.  Cont bb/acei.  4.  HL:  LDL 49.  Cont statin.  LFT's wnl.  5.  Hyperglycemia:  Mild hyperglycemia.  Needs primary care f/u and HbA1c  - seen @ [facility].  6.  L ankle fx:  S/P ORIF 10/22 @ outpt surgical center by Dr. Eulah PontMurphy.  Will ask ortho for input prior to discharge.  Signed, Nicolasa Duckinghristopher Corlis Angelica NP

## 2014-06-21 NOTE — Discharge Summary (Signed)
Discharge Summary   Patient ID: Adam Rowland,  MRN: [id number], DOB/AGE: 1946-07-01 68 y.o.  Admit date: 06/19/2014 Discharge date: 06/21/2014  Primary Care Provider: Rudi Heap Primary Cardiologist: Junius Argyle, MD   Discharge Diagnoses Principal Problem:   NSTEMI (non-ST elevated myocardial infarction)  **S/P PCI/DES to the LAD this admission.  **Residual LCX/OM1 dzs with plan for staged intervention in the future.  Active Problems:   CAD (coronary artery disease)   Cardiomyopathy, ischemic  **EF 45-50% by echo this admission.   Hyperlipidemia   Hypertension   NSVT (nonsustained ventricular tachycardia)   Nephrolithiasis   L Ankle Fx  **S/P ORIF on 10/22.  Allergies No Known Allergies  Procedures  Cardiac Catheterization and Percutaneous Coronary Intervention 10.23.2015  PROCEDURAL FINDINGS Hemodynamics: AO 121/59 mean 88 mm Hg LV 117/13 mm Hg              Coronary angiography: Coronary dominance: right  Left mainstem: Normal Left anterior descending (LAD): The LAD is heavily calcified. The mid LAD has a focal 99% stenosis in a long segment of disease. The first diagonal has a 90% ostial stenosis.     **The LAD was successfully stented using a 3.0 x 23 mm Xience Alpine DES.**  Left circumflex (LCx): The LCx gives rise to a large OM1 then terminates in a second OM. There is segmental disease in the mid vessel up to 80%. The first OM also has segmental disease to 80%.     **The LCX and OM1 will require percutaneous intervention following recovery from MI.**  Right coronary artery (RCA): There is 30% narrowing of the proximal and distal RCA. The PDA has 40-50% disease proximally. Left ventriculography: Left ventricular systolic function is abnormal, LVEF is estimated at 40-45% with mid to distal anterolateral and apical severe hypokinesis.There is no significant mitral regurgitation  _____________   2D Echocardiogram 10.23.2015  Study Conclusions  -  Left ventricle: The cavity size was normal. Systolic function was   mildly reduced. The estimated ejection fraction was in the range   of 45% to 50%. There is akinesis of the mid-apicalanteroseptal   myocardium; consistent with infarction. Features are consistent   with a pseudonormal left ventricular filling pattern, with   concomitant abnormal relaxation and increased filling pressure   (grade 2 diastolic dysfunction). - Right ventricle: The cavity size was mildly dilated. [place]   thickness was normal. _____________   History of Present Illness  68 year old male without prior cardiac history. He does have a history of hypertension, hyperlipidemia, and remote tobacco abuse. He has a several month history of intermittent exertional chest discomfort. Earlier this month, he fell off a ladder and broke his left ankle. He was seen by orthopedics and scheduled for surgery on October 22. He underwent successful ORIF of the left ankle on October 22 however postoperatively, developed substernal chest pain. He was referred from the surgical Center to the [facility] emergency department where ECG showed deep inferior Q waves. Troponin was elevated at 1.37. He was admitted for further management of non-ST segment elevation myocardial infarction.  Hospital Course  Patient eventually peaked his troponin at 6.44. In the setting of non-ST elevation MI, decision was made to pursue diagnostic catheterization. This was performed October 23 revealing severe mid LAD disease as well as obstructive left circumflex and first obtuse marginal disease and nonobstructive right coronary artery disease. The LAD was felt to be the infarct vessel and this was successfully treated using a Xience Alpine DES.   it  was felt that he would require a staged percutaneous intervention upon the left circumflex and obtuse marginal once he has adequate recovery from his myocardial infarction.   Post procedure, Mr. Balthazar has had no further  chest pain.  He has been seen by cardiac rehabilitation as well as orthopedic surgery and has been cleared for discharge from their standpoint. We will arrange for followup in our office in [place] within the week and he will followup with orthopedics on November 4. He will be discharged home today in good condition.  Discharge Vitals Blood pressure 124/58, pulse 94, temperature 99.1 F (37.3 C), temperature source Oral, resp. rate 18, height 5' 8.9" (1.75 m), weight 186 lb 8.2 oz (84.6 kg), SpO2 98.00%.  Filed Weights   06/19/14 1801 06/21/14 0007  Weight: 185 lb (83.915 kg) 186 lb 8.2 oz (84.6 kg)    Labs  CBC  Recent Labs  06/19/14 1738  06/20/14 0210 06/21/14 0259  WBC 10.6*  --  8.8 9.6  NEUTROABS 8.3*  --   --   --   HGB 13.0  < > 12.5* 11.8*  HCT 37.2*  < > 36.5* 33.2*  MCV 94.9  --  95.5 93.5  PLT 266  --  262 250  < > = values in this interval not displayed. Basic Metabolic Panel  Recent Labs  06/20/14 0210 06/21/14 0259  NA 139 137  K 4.2 4.3  CL 100 105  CO2 25 21  GLUCOSE 164* 132*  BUN 15 13  CREATININE 0.80 0.76  CALCIUM 8.3* 8.4   Liver Function Tests  Recent Labs  06/19/14 1738  AST 19  ALT 18  ALKPHOS 60  BILITOT 1.1  PROT 6.8  ALBUMIN 3.4*   Cardiac Enzymes  Recent Labs  06/20/14 0040 06/20/14 0210 06/20/14 1201  TROPONINI 5.54* 6.44* 5.23*   Fasting Lipid Panel  Recent Labs  06/20/14 0210  CHOL 100  HDL 31*  LDLCALC 49  TRIG 99  CHOLHDL 3.2   Disposition  Pt is being discharged home today in good condition.  Follow-up Plans & Appointments      Follow-up Information   Follow up with Prentice Docker A, MD In 1 week. (We will arrange and contact you. )    Specialty:  Cardiology   Contact information:   [address] [place] [address] [phone]       Follow up with Bennie Pierini, FNP On 07/21/2014. (8:30 AM)    Specialty:  Nurse Practitioner   Contact information:   [address]  [place] [address] [phone]       Follow up with Margarita Rana, D, MD. Schedule an appointment as soon as possible for a visit in 1 week.   Specialty:  Orthopedic Surgery   Contact information:   [address] [place] [address] [phone]      Discharge Medications    Medication List         aspirin 81 MG EC tablet  Take 1 tablet (81 mg total) by mouth daily.     atorvastatin 40 MG tablet  Commonly known as:  LIPITOR  Take 1 tablet (40 mg total) by mouth daily.     lisinopril 10 MG tablet  Commonly known as:  PRINIVIL,ZESTRIL  TAKE 1 TABLET DAILY     metoprolol tartrate 25 MG tablet  Commonly known as:  LOPRESSOR  Take 1 tablet (25 mg total) by mouth 2 (two) times daily.  nitroGLYCERIN 0.4 MG SL tablet  Commonly known as:  NITROSTAT  Place 1 tablet (0.4 mg total) under the tongue every 5 (five) minutes x 3 doses as needed for chest pain.     oxyCODONE-acetaminophen 5-325 MG per tablet  Commonly known as:  PERCOCET/ROXICET  Take 2 tablets by mouth every 6 (six) hours as needed for moderate pain.     prasugrel 10 MG Tabs tablet  Commonly known as:  EFFIENT  Take 1 tablet (10 mg total) by mouth daily.       Outstanding Labs/Studies  None  Duration of Discharge Encounter   Greater than 30 minutes including physician time.  Signed, Nicolasa Duckinghristopher Vernor Monnig NP 06/21/2014, 10:51 AM

## 2014-06-22 NOTE — ED Provider Notes (Signed)
Medical screening examination/treatment/procedure(s) were conducted as a shared visit with non-physician practitioner(s) and myself.  I personally evaluated the patient during the encounter.   EKG Interpretation   Date/Time:  Thursday June 19 2014 17:24:00 EDT Ventricular Rate:  91 PR Interval:  159 QRS Duration: 104 QT Interval:  368 QTC Calculation: 453 R Axis:   51 Text Interpretation:  Sinus rhythm inferior Q waves are new compared to  Jun 12 2014 Confirmed by Criss Alvine  MD, Saarah Dewing [phone]) on 06/19/2014 7:40:27  PM       Patient with MI s/p surgery. Pain free now. Discussed with ortho who prefers no anticoagulation but understands he may need it. Prior to administering, will consult cards given he is pain free. They want to see patient first and recommend holding on anticoagulation until their consult  Audree Camel, MD 06/22/14 2324

## 2014-06-23 MED FILL — Sodium Chloride IV Soln 0.9%: INTRAVENOUS | Qty: 50 | Status: AC

## 2014-06-24 NOTE — Telephone Encounter (Signed)
Patient contacted regarding discharge from [facility] on 06/21/2014.    Patient understands to follow up with Dr. Purvis Sheffield on 06/27/2014 at 10:20 [facility] office.   Patient understands discharge instructions?  Yes  Patient understands medications and regiment?  Yes  Patient understands to bring all medications to this visit?  Yes   Patient does c/o discomfort center of chest.  Does not feel this is cardiac.  Larey Seat on open ladder on 06/12/14 prior to this admission.  Does have a lot of bruising.  Patient was evaluated for fall at ED on 06/12/2014.

## 2014-06-27 ENCOUNTER — Ambulatory Visit (INDEPENDENT_AMBULATORY_CARE_PROVIDER_SITE_OTHER): Payer: Medicare Other | Admitting: Cardiovascular Disease

## 2014-06-27 ENCOUNTER — Encounter: Payer: Self-pay | Admitting: Cardiovascular Disease

## 2014-06-27 VITALS — BP 121/72 | HR 71 | Ht 69.0 in | Wt 153.0 lb

## 2014-06-27 DIAGNOSIS — Z9289 Personal history of other medical treatment: Secondary | ICD-10-CM

## 2014-06-27 DIAGNOSIS — I255 Ischemic cardiomyopathy: Secondary | ICD-10-CM

## 2014-06-27 DIAGNOSIS — I10 Essential (primary) hypertension: Secondary | ICD-10-CM

## 2014-06-27 DIAGNOSIS — I2510 Atherosclerotic heart disease of native coronary artery without angina pectoris: Secondary | ICD-10-CM

## 2014-06-27 DIAGNOSIS — I252 Old myocardial infarction: Secondary | ICD-10-CM

## 2014-06-27 DIAGNOSIS — Z955 Presence of coronary angioplasty implant and graft: Secondary | ICD-10-CM

## 2014-06-27 DIAGNOSIS — E785 Hyperlipidemia, unspecified: Secondary | ICD-10-CM

## 2014-06-27 MED ORDER — PRASUGREL HCL 10 MG PO TABS: 10.0000 mg | ORAL_TABLET | Freq: Every day | ORAL | Status: DC

## 2014-06-27 NOTE — Progress Notes (Signed)
Patient ID: Adam Rowland, male   DOB: Jan 30, 1946, 68 y.o.   MRN: [id number]      SUBJECTIVE: The patient returns for post hospital follow-up after sustaining a non-STEMI and undergoing coronary artery stent placement to the LAD. He has significant residual disease of the left circumflex and first obtuse marginal, and it was felt that a staged approach to additional intervention would be appropriate once he fully recovered from his myocardial infarction. He was found to have an ischemic cardiopathy, EF 45-50%. He also has hypertension and hyperlipidemia. He has some chest Piechota pain when he rotates to the left or to the right and this has occurred ever since he fell off the ladder. He denies shortness of breath, orthopnea, paroxysmal nocturnal dyspnea, and leg swelling. He denies bleeding problems with Effient.  Review of Systems: As per "subjective", otherwise negative.  No Known Allergies  Current Outpatient Prescriptions  Medication Sig Dispense Refill  . aspirin EC 81 MG EC tablet Take 1 tablet (81 mg total) by mouth daily.      Marland Kitchen atorvastatin (LIPITOR) 40 MG tablet Take 1 tablet (40 mg total) by mouth daily.  90 tablet  3  . lisinopril (PRINIVIL,ZESTRIL) 10 MG tablet TAKE 1 TABLET DAILY  30 tablet  6  . metoprolol tartrate (LOPRESSOR) 25 MG tablet Take 1 tablet (25 mg total) by mouth 2 (two) times daily.  60 tablet  6  . nitroGLYCERIN (NITROSTAT) 0.4 MG SL tablet Place 1 tablet (0.4 mg total) under the tongue every 5 (five) minutes x 3 doses as needed for chest pain.  25 tablet  3  . oxyCODONE-acetaminophen (PERCOCET/ROXICET) 5-325 MG per tablet Take 2 tablets by mouth every 6 (six) hours as needed for moderate pain.      . prasugrel (EFFIENT) 10 MG TABS tablet Take 1 tablet (10 mg total) by mouth daily.  30 tablet  0   No current facility-administered medications for this visit.    Past Medical History  Diagnosis Date  . Hyperlipidemia   . Hypertension   . Cardiomyopathy, ischemic     a. 05/2014 Echo: Ef 45-50%, mid-apicalanteroseptal AK, Gr 2 DD.  Marland Kitchen CAD (coronary artery disease)     a. 05/2014 NSTEMI/PCI: LM nl, LAD 85m (3.0x23 Xience Alpine DES), D1 90, LCX 77m, OM1 80, RCA 30p/d, RPDA 40-50p, EF 40-45% ->Plan for staged PCI of LCX/OM1.  . Nephrolithiasis 01/16/2013  . NSVT (nonsustained ventricular tachycardia)     a. 05/2014 in setting of NSTEMI -  asymptomatic.    Past Surgical History  Procedure Laterality Date  . Nasal septum surgery    . Ankle fracture surgery      History   Social History  . Marital Status: Married    Spouse Name: N/A    Number of Children: N/A  . Years of Education: N/A   Occupational History  . Not on file.   Social History Main Topics  . Smoking status: Former Smoker    Types: Cigarettes    Quit date: 08/30/1987  . Smokeless tobacco: Never Used     Comment: quit in 1989, smoked since he was a teenager  . Alcohol Use: 4.2 oz/week    7 Glasses of wine per week     Comment: 1 glass of wine nightly   . Drug Use: No  . Sexual Activity: Not on file   Other Topics Concern  . Not on file   Social History Narrative  . No narrative on file  Filed Vitals:   06/27/14 1039  BP: 121/72  Pulse: 71  Height: 5\' 9"  (1.753 m)  Weight: 153 lb (69.4 kg)    PHYSICAL EXAM General: NAD HEENT: Normal. Neck: No JVD, no thyromegaly. Lungs: Clear to auscultation bilaterally with normal respiratory effort. CV: Nondisplaced PMI.  Regular rate and rhythm, normal S1/S2, no S3/S4, no murmur. Left leg wrapped in bandages.  No carotid bruit.   Abdomen: Soft, nontender, no hepatosplenomegaly, no distention.  Neurologic: Alert and oriented x 3.  Psych: Normal affect. Skin: Normal. Musculoskeletal: Normal range of motion, no gross deformities. Extremities: No clubbing or cyanosis.   ECG: Most recent ECG reviewed.      ASSESSMENT AND PLAN: 1. CAD s/p LAD stent for NSTEMI: Stable ischemic heart disease. Continue aspirin, Lipitor,  lisinopril, metoprolol, and Effient. He will engage in cardiac rehabilitation when he has been cleared to do so from orthopedic surgery. He will eventually require a staged intervention of the left circumflex coronary artery and first obtuse marginal branch. 2. Essential HTN: Well controlled on current therapy which includes lisinopril 10 mg daily. No changes to therapy indicated. 3. Hyperlipidemia: Lipids checked on 10/23 with LDL 49. Continue Lipitor 40 mg daily. 4. Ischemic cardiomyopathy: Symptomatically stable with no evidence of heart failure. No changes to therapy which included an ACE inhibitor and beta blocker.  Dispo: f/u 3 months.  Prentice DockerSuresh Zamzam Whinery, M.D., F.A.C.C.

## 2014-06-27 NOTE — Patient Instructions (Signed)
Continue all current medications. Follow up in  3 months 

## 2014-06-27 NOTE — Addendum Note (Signed)
Addended by: Lesle Chris on: 06/27/2014 11:09 AM   Modules accepted: Orders

## 2014-07-20 DIAGNOSIS — I10 Essential (primary) hypertension: Secondary | ICD-10-CM

## 2014-07-20 DIAGNOSIS — Z87898 Personal history of other specified conditions: Secondary | ICD-10-CM

## 2014-07-20 DIAGNOSIS — I255 Ischemic cardiomyopathy: Secondary | ICD-10-CM

## 2014-07-20 DIAGNOSIS — I2510 Atherosclerotic heart disease of native coronary artery without angina pectoris: Secondary | ICD-10-CM

## 2014-07-20 DIAGNOSIS — Z955 Presence of coronary angioplasty implant and graft: Secondary | ICD-10-CM

## 2014-07-20 DIAGNOSIS — E785 Hyperlipidemia, unspecified: Secondary | ICD-10-CM

## 2014-07-20 DIAGNOSIS — I252 Old myocardial infarction: Secondary | ICD-10-CM

## 2014-07-21 ENCOUNTER — Ambulatory Visit (INDEPENDENT_AMBULATORY_CARE_PROVIDER_SITE_OTHER): Payer: Medicare Other | Admitting: Nurse Practitioner

## 2014-07-21 ENCOUNTER — Encounter: Payer: Self-pay | Admitting: Nurse Practitioner

## 2014-07-21 VITALS — BP 126/62 | HR 63 | Temp 96.9°F | Ht 69.0 in | Wt 183.0 lb

## 2014-07-21 DIAGNOSIS — I2510 Atherosclerotic heart disease of native coronary artery without angina pectoris: Secondary | ICD-10-CM

## 2014-07-21 DIAGNOSIS — I10 Essential (primary) hypertension: Secondary | ICD-10-CM

## 2014-07-21 DIAGNOSIS — E785 Hyperlipidemia, unspecified: Secondary | ICD-10-CM

## 2014-07-21 DIAGNOSIS — Z125 Encounter for screening for malignant neoplasm of prostate: Secondary | ICD-10-CM

## 2014-07-21 MED ORDER — METOPROLOL TARTRATE 25 MG PO TABS
25.0000 mg | ORAL_TABLET | Freq: Two times a day (BID) | ORAL | Status: DC
Start: 2014-07-21 — End: 2015-02-16

## 2014-07-21 MED ORDER — ATORVASTATIN CALCIUM 40 MG PO TABS: 40.0000 mg | ORAL_TABLET | Freq: Every day | ORAL | Status: DC

## 2014-07-21 MED ORDER — LISINOPRIL 10 MG PO TABS: ORAL_TABLET | ORAL | Status: DC

## 2014-07-21 NOTE — Patient Instructions (Signed)
Fat and Cholesterol Control Diet Fat and cholesterol levels in your blood and organs are influenced by your diet. High levels of fat and cholesterol may lead to diseases of the heart, small and large blood vessels, gallbladder, liver, and pancreas. CONTROLLING FAT AND CHOLESTEROL WITH DIET Although exercise and lifestyle factors are important, your diet is key. That is because certain foods are known to raise cholesterol and others to lower it. The goal is to balance foods for their effect on cholesterol and more importantly, to replace saturated and trans fat with other types of fat, such as monounsaturated fat, polyunsaturated fat, and omega-3 fatty acids. On average, a person should consume no more than 15 to 17 g of saturated fat daily. Saturated and trans fats are considered "bad" fats, and they will raise LDL cholesterol. Saturated fats are primarily found in animal products such as meats, butter, and cream. However, that does not mean you need to give up all your favorite foods. Today, there are good tasting, low-fat, low-cholesterol substitutes for most of the things you like to eat. Choose low-fat or nonfat alternatives. Choose round or loin cuts of red meat. These types of cuts are lowest in fat and cholesterol. Chicken (without the skin), fish, veal, and ground turkey breast are great choices. Eliminate fatty meats, such as hot dogs and salami. Even shellfish have little or no saturated fat. Have a 3 oz (85 g) portion when you eat lean meat, poultry, or fish. Trans fats are also called "partially hydrogenated oils." They are oils that have been scientifically manipulated so that they are solid at room temperature resulting in a longer shelf life and improved taste and texture of foods in which they are added. Trans fats are found in stick margarine, some tub margarines, cookies, crackers, and baked goods.  When baking and cooking, oils are a great substitute for butter. The monounsaturated oils are  especially beneficial since it is believed they lower LDL and raise HDL. The oils you should avoid entirely are saturated tropical oils, such as coconut and palm.  Remember to eat a lot from food groups that are naturally free of saturated and trans fat, including fish, fruit, vegetables, beans, grains (barley, rice, couscous, bulgur wheat), and pasta (without cream sauces).  IDENTIFYING FOODS THAT LOWER FAT AND CHOLESTEROL  Soluble fiber may lower your cholesterol. This type of fiber is found in fruits such as apples, vegetables such as broccoli, potatoes, and carrots, legumes such as beans, peas, and lentils, and grains such as barley. Foods fortified with plant sterols (phytosterol) may also lower cholesterol. You should eat at least 2 g per day of these foods for a cholesterol lowering effect.  Read package labels to identify low-saturated fats, trans fat free, and low-fat foods at the supermarket. Select cheeses that have only 2 to 3 g saturated fat per ounce. Use a heart-healthy tub margarine that is free of trans fats or partially hydrogenated oil. When buying baked goods (cookies, crackers), avoid partially hydrogenated oils. Breads and muffins should be made from whole grains (whole-wheat or whole oat flour, instead of "flour" or "enriched flour"). Buy non-creamy canned soups with reduced salt and no added fats.  FOOD PREPARATION TECHNIQUES  Never deep-fry. If you must fry, either stir-fry, which uses very little fat, or use non-stick cooking sprays. When possible, broil, bake, or roast meats, and steam vegetables. Instead of putting butter or margarine on vegetables, use lemon and herbs, applesauce, and cinnamon (for squash and sweet potatoes). Use nonfat   yogurt, salsa, and low-fat dressings for salads.  LOW-SATURATED FAT / LOW-FAT FOOD SUBSTITUTES Meats / Saturated Fat (g)  Avoid: Steak, marbled (3 oz/85 g) / 11 g  Choose: Steak, lean (3 oz/85 g) / 4 g  Avoid: Hamburger (3 oz/85 g) / 7  g  Choose: Hamburger, lean (3 oz/85 g) / 5 g  Avoid: Ham (3 oz/85 g) / 6 g  Choose: Ham, lean cut (3 oz/85 g) / 2.4 g  Avoid: Chicken, with skin, dark meat (3 oz/85 g) / 4 g  Choose: Chicken, skin removed, dark meat (3 oz/85 g) / 2 g  Avoid: Chicken, with skin, light meat (3 oz/85 g) / 2.5 g  Choose: Chicken, skin removed, light meat (3 oz/85 g) / 1 g Dairy / Saturated Fat (g)  Avoid: Whole milk (1 cup) / 5 g  Choose: Low-fat milk, 2% (1 cup) / 3 g  Choose: Low-fat milk, 1% (1 cup) / 1.5 g  Choose: Skim milk (1 cup) / 0.3 g  Avoid: Hard cheese (1 oz/28 g) / 6 g  Choose: Skim milk cheese (1 oz/28 g) / 2 to 3 g  Avoid: Cottage cheese, 4% fat (1 cup) / 6.5 g  Choose: Low-fat cottage cheese, 1% fat (1 cup) / 1.5 g  Avoid: Ice cream (1 cup) / 9 g  Choose: Sherbet (1 cup) / 2.5 g  Choose: Nonfat frozen yogurt (1 cup) / 0.3 g  Choose: Frozen fruit bar / trace  Avoid: Whipped cream (1 tbs) / 3.5 g  Choose: Nondairy whipped topping (1 tbs) / 1 g Condiments / Saturated Fat (g)  Avoid: Mayonnaise (1 tbs) / 2 g  Choose: Low-fat mayonnaise (1 tbs) / 1 g  Avoid: Butter (1 tbs) / 7 g  Choose: Extra light margarine (1 tbs) / 1 g  Avoid: Coconut oil (1 tbs) / 11.8 g  Choose: Olive oil (1 tbs) / 1.8 g  Choose: Corn oil (1 tbs) / 1.7 g  Choose: Safflower oil (1 tbs) / 1.2 g  Choose: Sunflower oil (1 tbs) / 1.4 g  Choose: Soybean oil (1 tbs) / 2.4 g  Choose: Canola oil (1 tbs) / 1 g Document Released: 08/15/2005 Document Revised: 12/10/2012 Document Reviewed: 11/13/2013 Exit[employer] Patient Information 2015 [employer]. This information is not intended to replace advice given to you by your health care provider. Make sure you discuss any questions you have with your health care provider.  

## 2014-07-21 NOTE — Progress Notes (Signed)
Subjective:    Patient ID: Adam Rowland, male    DOB: August 22, 1946, 68 y.o.   MRN: [id number]  [name] in today for CPE.  Hyperlipidemia This is a chronic problem. The problem is controlled. Recent lipid tests were reviewed and are normal. He has no history of diabetes, hypothyroidism or obesity. Pertinent negatives include no chest pain, myalgias or shortness of breath. Current antihyperlipidemic treatment includes statins. The current treatment provides moderate improvement of lipids. Compliance problems include adherence to diet and adherence to exercise.  Risk factors for coronary artery disease include dyslipidemia, family history, hypertension and male sex.  Hypertension This is a chronic problem. The current episode started more than 1 year ago. The problem is unchanged. The problem is controlled. Pertinent negatives include no chest pain, palpitations or shortness of breath. There are no associated agents to hypertension. Past treatments include ACE inhibitors. The current treatment provides moderate improvement. Compliance problems include diet and exercise.  Hypertensive end-organ damage includes CAD/MI.  CAD Fell and broke ankle and doing having surgery was found to have had heart attack in past; did cardiac cath and found blockage 99 % in LAD and had stent put in and he also had two arteries that were 80% occluded and they are going to watch those.     Review of Systems  Constitutional: Positive for diaphoresis. Negative for fatigue.  HENT: Negative.   Eyes: Negative.   Respiratory: Negative for chest tightness and shortness of breath.   Cardiovascular: Negative for chest pain and palpitations.  Endocrine: Negative.   Genitourinary: Negative.   Musculoskeletal: Negative.  Negative for myalgias.  Neurological: Negative.   Hematological: Negative.   Psychiatric/Behavioral: Negative.   All other systems reviewed and are negative.      Objective:   Physical Exam   Constitutional: He is oriented to person, place, and time. He appears well-developed and well-nourished.  HENT:  Head: Normocephalic.  Right Ear: External ear normal.  Left Ear: External ear normal.  Nose: Nose normal.  Mouth/Throat: Oropharynx is clear and moist.  Eyes: EOM are normal. Pupils are equal, round, and reactive to light.  Neck: Normal range of motion. Neck supple. No JVD present. No thyromegaly present.  Cardiovascular: Normal rate, regular rhythm, normal heart sounds and intact distal pulses.  Exam reveals no gallop and no friction rub.   No murmur heard. Pulmonary/Chest: Effort normal and breath sounds normal. No respiratory distress. He has no wheezes. He has no rales. He exhibits no tenderness.  Abdominal: Soft. Bowel sounds are normal. He exhibits no mass. There is no tenderness.  Genitourinary: Prostate normal and penis normal.  Musculoskeletal: Normal range of motion. He exhibits no edema.  Lymphadenopathy:    He has no cervical adenopathy.  Neurological: He is alert and oriented to person, place, and time. No cranial nerve deficit.  Skin: Skin is warm and dry.  Psychiatric: He has a normal mood and affect. His behavior is normal. Judgment and thought content normal.   BP 126/62 mmHg  Pulse 63  Temp(Src) 96.9 F (36.1 C) (Oral)  Ht 5' 9"  (1.753 m)  Wt 183 lb (83.008 kg)  BMI 27.01 kg/m2         Assessment & Plan:   1. Hyperlipidemia Low fat diet - atorvastatin (LIPITOR) 40 MG tablet; Take 1 tablet (40 mg total) by mouth daily.  Dispense: 90 tablet; Refill: 3 - NMR, lipoprofile  2. Essential hypertension Do not add salt to diet - lisinopril (PRINIVIL,ZESTRIL) 10 MG tablet; TAKE  1 TABLET DAILY  Dispense: 30 tablet; Refill: 6 - CMP14+EGFR  3. Coronary artery disease involving native coronary artery of native heart without angina pectoris Keep follow up with cardiology - metoprolol tartrate (LOPRESSOR) 25 MG tablet; Take 1 tablet (25 mg total) by  mouth 2 (two) times daily.  Dispense: 60 tablet; Refill: 6  4. Prostate cancer screening - PSA, total and free    Labs pending Health maintenance reviewed Diet and exercise encouraged Continue all meds Follow up  In 3 months   [place], FNP

## 2014-07-22 LAB — PSA, TOTAL AND FREE
PSA, Free Pct: 50 %
PSA, Free: 0.4 ng/mL
PSA: 0.8 ng/mL (ref 0.0–4.0)

## 2014-07-22 LAB — NMR, LIPOPROFILE
CHOLESTEROL: 142 mg/dL (ref 100–199)
HDL Cholesterol by NMR: 39 mg/dL — ABNORMAL LOW (ref >39)
HDL Particle Number: 28.1 umol/L — ABNORMAL LOW (ref >=30.5)
LDL PARTICLE NUMBER: 1034 nmol/L — AB (ref <1000)
LDL SIZE: 20.1 nm (ref >20.5)
LDL-C: 75 mg/dL (ref 0–99)
LP-IR Score: 73 — ABNORMAL HIGH (ref <=45)
Small LDL Particle Number: 674 nmol/L — ABNORMAL HIGH (ref <=527)
Triglycerides by NMR: 139 mg/dL (ref 0–149)

## 2014-07-22 LAB — CMP14+EGFR
ALBUMIN: 4.1 g/dL (ref 3.6–4.8)
ALT: 18 IU/L (ref 0–44)
AST: 19 IU/L (ref 0–40)
Albumin/Globulin Ratio: 1.7 (ref 1.1–2.5)
Alkaline Phosphatase: 77 IU/L (ref 39–117)
BILIRUBIN TOTAL: 0.6 mg/dL (ref 0.0–1.2)
BUN/Creatinine Ratio: 13 (ref 10–22)
BUN: 10 mg/dL (ref 8–27)
CHLORIDE: 102 mmol/L (ref 97–108)
CO2: 23 mmol/L (ref 18–29)
Calcium: 9.3 mg/dL (ref 8.6–10.2)
Creatinine, Ser: 0.76 mg/dL (ref 0.76–1.27)
GFR, EST AFRICAN AMERICAN: 109 mL/min/{1.73_m2} (ref >59)
GFR, EST NON AFRICAN AMERICAN: 94 mL/min/{1.73_m2} (ref >59)
GLUCOSE: 81 mg/dL (ref 65–99)
Globulin, Total: 2.4 g/dL (ref 1.5–4.5)
Potassium: 4.6 mmol/L (ref 3.5–5.2)
Sodium: 139 mmol/L (ref 134–144)
TOTAL PROTEIN: 6.5 g/dL (ref 6.0–8.5)

## 2014-08-07 ENCOUNTER — Encounter (HOSPITAL_COMMUNITY)
Admission: RE | Admit: 2014-08-07 | Discharge: 2014-08-07 | Disposition: A | Discharge status: Home / Self Care | Payer: Medicare Other | Source: Ambulatory Visit | Attending: Cardiovascular Disease | Admitting: Cardiovascular Disease

## 2014-08-07 ENCOUNTER — Encounter (HOSPITAL_COMMUNITY): Payer: Self-pay

## 2014-08-07 VITALS — BP 116/60 | HR 57 | Ht 69.0 in | Wt 177.9 lb

## 2014-08-07 DIAGNOSIS — I214 Non-ST elevation (NSTEMI) myocardial infarction: Secondary | ICD-10-CM

## 2014-08-07 DIAGNOSIS — Z955 Presence of coronary angioplasty implant and graft: Secondary | ICD-10-CM

## 2014-08-07 NOTE — Progress Notes (Signed)
Patient was referred by Dr. Purvis Sheffield due to post NSTEMI I2I.4 and Stent placement Z95.5. During orientation advised patient on arrival and appointment times what to wear, what to do before, during and after exercise. Reviewed attendance and class policy. Talked about inclement weather and class consultation policy. Pt is scheduled to start Cardiac Rehab after fully recovered from ankle fracture. Patient will call us so that we can schedule his visits. Pt was advised to come to class 5 minutes before class starts. He was also given instructions on meeting with the dietician and attending the Family Structure classes. Pt is eager to get started. Patient will get his pre six minute walk test on his 1st visit.

## 2014-09-08 ENCOUNTER — Encounter (HOSPITAL_COMMUNITY): Admission: RE | Admit: 2014-09-08 | Payer: Self-pay | Source: Ambulatory Visit

## 2014-09-10 ENCOUNTER — Encounter (HOSPITAL_COMMUNITY): Payer: Medicare Other

## 2014-09-12 ENCOUNTER — Encounter (HOSPITAL_COMMUNITY): Payer: Medicare Other

## 2014-09-15 ENCOUNTER — Encounter (HOSPITAL_COMMUNITY): Payer: Medicare Other

## 2014-09-17 ENCOUNTER — Encounter (HOSPITAL_COMMUNITY): Payer: Medicare Other

## 2014-09-19 ENCOUNTER — Encounter (HOSPITAL_COMMUNITY): Payer: Medicare Other

## 2014-09-22 ENCOUNTER — Encounter (HOSPITAL_COMMUNITY): Payer: Medicare Other

## 2014-09-24 ENCOUNTER — Encounter (HOSPITAL_COMMUNITY): Payer: Medicare Other

## 2014-09-25 ENCOUNTER — Encounter: Payer: Self-pay | Admitting: Cardiovascular Disease

## 2014-09-25 ENCOUNTER — Ambulatory Visit (INDEPENDENT_AMBULATORY_CARE_PROVIDER_SITE_OTHER): Payer: Medicare Other | Admitting: Cardiovascular Disease

## 2014-09-25 VITALS — BP 128/64 | HR 67 | Ht 69.0 in | Wt 177.0 lb

## 2014-09-25 DIAGNOSIS — Z955 Presence of coronary angioplasty implant and graft: Secondary | ICD-10-CM

## 2014-09-25 DIAGNOSIS — I252 Old myocardial infarction: Secondary | ICD-10-CM

## 2014-09-25 DIAGNOSIS — E785 Hyperlipidemia, unspecified: Secondary | ICD-10-CM

## 2014-09-25 DIAGNOSIS — I10 Essential (primary) hypertension: Secondary | ICD-10-CM

## 2014-09-25 DIAGNOSIS — I255 Ischemic cardiomyopathy: Secondary | ICD-10-CM

## 2014-09-25 DIAGNOSIS — I2510 Atherosclerotic heart disease of native coronary artery without angina pectoris: Secondary | ICD-10-CM

## 2014-09-25 NOTE — Patient Instructions (Signed)
Continue all current medications. Follow up in  4 months  

## 2014-09-25 NOTE — Progress Notes (Signed)
Patient ID: KASMER SEEPERSAD, male   DOB: 04/20/1946, 69 y.o.   MRN: [id number]      SUBJECTIVE: Mr. Brouse returns for routine cardiovascular follow-up. He has a history of a non-STEMI in 10/15 and underwent coronary artery stent placement to the LAD. He has significant residual disease of the left circumflex and first obtuse marginal, and it was felt that a staged approach to additional intervention would be appropriate once he fully recovered from his myocardial infarction. He has an ischemic cardiomyopathy, EF 45-50%. He also has hypertension and hyperlipidemia. He denies chest pain and shortness of breath. He has been unable to start cardiac rehabilitation as he would have to pay nearly $200 per session out of pocket. He does ride a stationary bicycle in his house 30 minutes daily without any difficulties whatsoever. He seldom has palpitations but denies leg swelling and syncope.      Review of Systems: As per "subjective", otherwise negative.  No Known Allergies  Current Outpatient Prescriptions  Medication Sig Dispense Refill  . aspirin EC 81 MG EC tablet Take 1 tablet (81 mg total) by mouth daily.    Marland Kitchen atorvastatin (LIPITOR) 40 MG tablet Take 1 tablet (40 mg total) by mouth daily. 90 tablet 3  . lisinopril (PRINIVIL,ZESTRIL) 10 MG tablet TAKE 1 TABLET DAILY 30 tablet 6  . metoprolol tartrate (LOPRESSOR) 25 MG tablet Take 1 tablet (25 mg total) by mouth 2 (two) times daily. 60 tablet 6  . nitroGLYCERIN (NITROSTAT) 0.4 MG SL tablet Place 1 tablet (0.4 mg total) under the tongue every 5 (five) minutes x 3 doses as needed for chest pain. 25 tablet 3  . oxyCODONE-acetaminophen (PERCOCET/ROXICET) 5-325 MG per tablet Take 2 tablets by mouth as needed for moderate pain.     . prasugrel (EFFIENT) 10 MG TABS tablet Take 1 tablet (10 mg total) by mouth daily. 90 tablet 3   No current facility-administered medications for this visit.    Past Medical History  Diagnosis Date  . Hyperlipidemia   .  Hypertension   . Cardiomyopathy, ischemic     a. 05/2014 Echo: Ef 45-50%, mid-apicalanteroseptal AK, Gr 2 DD.  Marland Kitchen CAD (coronary artery disease)     a. 05/2014 NSTEMI/PCI: LM nl, LAD 68m (3.0x23 Xience Alpine DES), D1 90, LCX 38m, OM1 80, RCA 30p/d, RPDA 40-50p, EF 40-45% ->Plan for staged PCI of LCX/OM1.  . Nephrolithiasis 01/16/2013  . NSVT (nonsustained ventricular tachycardia)     a. 05/2014 in setting of NSTEMI -  asymptomatic.    Past Surgical History  Procedure Laterality Date  . Nasal septum surgery    . Ankle fracture surgery    . Cardiac catheterization    . Coronary stent placement    . Left heart catheterization with coronary angiogram N/A 06/20/2014    Procedure: LEFT HEART CATHETERIZATION WITH CORONARY ANGIOGRAM;  Surgeon: Peter M Swaziland, MD;  Location: [facility] CATH LAB;  Service: Cardiovascular;  Laterality: N/A;  . Cardiac catheterization Right 06/20/2014    Procedure: CORONARY STENT INTERVENTION;  Surgeon: Peter M Swaziland, MD;  Location: [facility] CATH LAB;  Service: Cardiovascular;  Laterality: Right;    History   Social History  . Marital Status: Married    Spouse Name: N/A    Number of Children: N/A  . Years of Education: N/A   Occupational History  . Not on file.   Social History Main Topics  . Smoking status: Former Smoker    Types: Cigarettes    Quit date: 08/30/1987  .  Smokeless tobacco: Never Used     Comment: quit in 1989, smoked since he was a teenager  . Alcohol Use: 4.2 oz/week    7 Glasses of wine per week     Comment: 1 glass of wine nightly   . Drug Use: No  . Sexual Activity: Not on file   Other Topics Concern  . Not on file   Social History Narrative     Filed Vitals:   09/25/14 1322  BP: 128/64  Pulse: 67  Height:  (1.753 m)  Weight: 177 lb (80.287 kg)  SpO2: 98%    PHYSICAL EXAM General: NAD HEENT: Normal. Neck: No JVD, no thyromegaly. Lungs: Clear to auscultation bilaterally with normal respiratory effort. CV: Nondisplaced  PMI.  Regular rate and rhythm, normal S1/S2, no S3/S4, no murmur. No pretibial or periankle edema.  No carotid bruit.  Normal pedal pulses.  Abdomen: Soft, nontender, no hepatosplenomegaly, no distention.  Neurologic: Alert and oriented x 3.  Psych: Normal affect. Skin: Normal. Musculoskeletal: Normal range of motion, no gross deformities. Extremities: No clubbing or cyanosis.   ECG: Most recent ECG reviewed.      ASSESSMENT AND PLAN: 1. CAD s/p LAD stent for NSTEMI: Stable ischemic heart disease. Continue aspirin, Lipitor, lisinopril, metoprolol, and Effient. He may eventually require a staged intervention of the left circumflex coronary artery and first obtuse marginal branch if optimal medical therapy fails. 2. Essential HTN: Well controlled on current therapy which includes lisinopril 10 mg daily. No changes to therapy indicated. 3. Hyperlipidemia: Lipids checked on 10/23 with LDL 49. Continue Lipitor 40 mg daily. 4. Ischemic cardiomyopathy: Symptomatically stable with no evidence of heart failure. No changes to therapy which included an ACE inhibitor and beta blocker.  Dispo: f/u 4 months.   Prentice Docker, M.D., F.A.C.C.

## 2014-09-26 ENCOUNTER — Encounter (HOSPITAL_COMMUNITY): Payer: Medicare Other

## 2014-09-29 ENCOUNTER — Encounter (HOSPITAL_COMMUNITY): Payer: Medicare Other

## 2014-10-01 ENCOUNTER — Encounter (HOSPITAL_COMMUNITY): Payer: Medicare Other

## 2014-10-03 ENCOUNTER — Encounter (HOSPITAL_COMMUNITY): Payer: Self-pay

## 2014-10-06 ENCOUNTER — Encounter (HOSPITAL_COMMUNITY): Payer: Medicare Other

## 2014-10-08 ENCOUNTER — Encounter (HOSPITAL_COMMUNITY): Payer: Medicare Other

## 2014-10-10 ENCOUNTER — Encounter (HOSPITAL_COMMUNITY): Payer: Medicare Other

## 2014-10-13 ENCOUNTER — Encounter (HOSPITAL_COMMUNITY): Payer: Medicare Other

## 2014-10-14 ENCOUNTER — Other Ambulatory Visit: Payer: Self-pay | Admitting: *Deleted

## 2014-10-14 MED ORDER — PRASUGREL HCL 10 MG PO TABS: 10.0000 mg | ORAL_TABLET | Freq: Every day | ORAL | Status: DC

## 2014-10-15 ENCOUNTER — Encounter (HOSPITAL_COMMUNITY): Payer: Medicare Other

## 2014-10-17 ENCOUNTER — Encounter (HOSPITAL_COMMUNITY): Payer: Medicare Other

## 2014-10-20 ENCOUNTER — Encounter (HOSPITAL_COMMUNITY): Payer: Medicare Other

## 2014-10-22 ENCOUNTER — Encounter (HOSPITAL_COMMUNITY): Payer: Medicare Other

## 2014-10-23 ENCOUNTER — Ambulatory Visit (INDEPENDENT_AMBULATORY_CARE_PROVIDER_SITE_OTHER): Payer: Medicare Other | Admitting: Nurse Practitioner

## 2014-10-23 ENCOUNTER — Encounter: Payer: Self-pay | Admitting: Nurse Practitioner

## 2014-10-23 VITALS — BP 119/71 | HR 62 | Temp 98.0°F | Ht 69.0 in | Wt 184.4 lb

## 2014-10-23 DIAGNOSIS — I2510 Atherosclerotic heart disease of native coronary artery without angina pectoris: Secondary | ICD-10-CM

## 2014-10-23 DIAGNOSIS — I214 Non-ST elevation (NSTEMI) myocardial infarction: Secondary | ICD-10-CM

## 2014-10-23 DIAGNOSIS — E785 Hyperlipidemia, unspecified: Secondary | ICD-10-CM

## 2014-10-23 DIAGNOSIS — I10 Essential (primary) hypertension: Secondary | ICD-10-CM

## 2014-10-23 NOTE — Progress Notes (Signed)
Subjective:    Patient ID: Adam Rowland, male    DOB: [birth year], 69 y.o.   MRN: [id number]  [name] in today for follow up of chronic medical problems.  Hyperlipidemia This is a chronic problem. The problem is controlled. Recent lipid tests were reviewed and are normal. He has no history of diabetes, hypothyroidism or obesity. Pertinent negatives include no chest pain, myalgias or shortness of breath. Current antihyperlipidemic treatment includes statins. The current treatment provides moderate improvement of lipids. Compliance problems include adherence to diet and adherence to exercise.  Risk factors for coronary artery disease include dyslipidemia, family history, hypertension and male sex.  Hypertension This is a chronic problem. The current episode started more than 1 year ago. The problem is unchanged. The problem is controlled. Pertinent negatives include no chest pain, palpitations or shortness of breath. There are no associated agents to hypertension. Past treatments include ACE inhibitors. The current treatment provides moderate improvement. Compliance problems include diet and exercise.  Hypertensive end-organ damage includes CAD/MI.  CAD Fell and broke ankle and doing having surgery was found to have had heart attack in past; did cardiac cath and found blockage 99 % in LAD and had stent put in and he also had two arteries that were 80% occluded and they are going to watch those.     Review of Systems  Constitutional: Positive for diaphoresis. Negative for fatigue.  HENT: Negative.   Eyes: Negative.   Respiratory: Negative for chest tightness and shortness of breath.   Cardiovascular: Negative for chest pain and palpitations.  Endocrine: Negative.   Genitourinary: Negative.   Musculoskeletal: Negative.  Negative for myalgias.  Neurological: Negative.   Hematological: Negative.   Psychiatric/Behavioral: Negative.   All other systems reviewed and are negative.      Objective:    Physical Exam  Constitutional: He is oriented to person, place, and time. He appears well-developed and well-nourished.  HENT:  Head: Normocephalic.  Right Ear: External ear normal.  Left Ear: External ear normal.  Nose: Nose normal.  Mouth/Throat: Oropharynx is clear and moist.  Eyes: EOM are normal. Pupils are equal, round, and reactive to light.  Neck: Normal range of motion. Neck supple. No JVD present. No thyromegaly present.  Cardiovascular: Normal rate, regular rhythm, normal heart sounds and intact distal pulses.  Exam reveals no gallop and no friction rub.   No murmur heard. Pulmonary/Chest: Effort normal and breath sounds normal. No respiratory distress. He has no wheezes. He has no rales. He exhibits no tenderness.  Abdominal: Soft. Bowel sounds are normal. He exhibits no mass. There is no tenderness.  Genitourinary: Prostate normal and penis normal.  Musculoskeletal: Normal range of motion. He exhibits no edema.  Lymphadenopathy:    He has no cervical adenopathy.  Neurological: He is alert and oriented to person, place, and time. No cranial nerve deficit.  Skin: Skin is warm and dry.  Psychiatric: He has a normal mood and affect. His behavior is normal. Judgment and thought content normal.    BP 119/71 mmHg  Pulse 62  Temp(Src) 98 F (36.7 C) (Oral)  Ht 5' 9"  (1.753 m)  Wt 184 lb 6.4 oz (83.643 kg)  BMI 27.22 kg/m2          Assessment & Plan:  1. NSTEMI (non-ST elevated myocardial infarction) Keep follow up with cardiologist  2. Coronary artery disease involving native coronary artery of native heart without angina pectoris  3. Essential hypertension Do not add salt to diet - CMP14+EGFR  4. Hyperlipidemia Low fat diet - NMR, lipoprofile    Labs pending Health maintenance reviewed Diet and exercise encouraged Continue all meds Follow up  In 3 month   [place], FNP

## 2014-10-23 NOTE — Patient Instructions (Signed)
Exercise to Stay Healthy Exercise helps you become and stay healthy. EXERCISE IDEAS AND TIPS Choose exercises that:  You enjoy.  Fit into your day. You do not need to exercise really hard to be healthy. You can do exercises at a slow or medium level and stay healthy. You can:  Stretch before and after working out.  Try yoga, Pilates, or tai chi.  Lift weights.  Walk fast, swim, jog, run, climb stairs, bicycle, dance, or rollerskate.  Take aerobic classes. Exercises that burn about 150 calories:  Running 1  miles in 15 minutes.  Playing volleyball for 45 to 60 minutes.  Washing and waxing a car for 45 to 60 minutes.  Playing touch football for 45 minutes.  Walking 1  miles in 35 minutes.  Pushing a stroller 1  miles in 30 minutes.  Playing basketball for 30 minutes.  Raking leaves for 30 minutes.  Bicycling 5 miles in 30 minutes.  Walking 2 miles in 30 minutes.  Dancing for 30 minutes.  Shoveling snow for 15 minutes.  Swimming laps for 20 minutes.  Walking up stairs for 15 minutes.  Bicycling 4 miles in 15 minutes.  Gardening for 30 to 45 minutes.  Jumping rope for 15 minutes.  Washing windows or floors for 45 to 60 minutes. Document Released: 09/17/2010 Document Revised: 11/07/2011 Document Reviewed: 09/17/2010 Exit[employer] Patient Information 2015 [employer]. This information is not intended to replace advice given to you by your health care provider. Make sure you discuss any questions you have with your health care provider.  

## 2014-10-24 ENCOUNTER — Encounter (HOSPITAL_COMMUNITY): Payer: Medicare Other

## 2014-10-24 LAB — NMR, LIPOPROFILE
CHOLESTEROL: 151 mg/dL (ref 100–199)
HDL Cholesterol by NMR: 42 mg/dL (ref >39)
HDL PARTICLE NUMBER: 30.8 umol/L (ref >=30.5)
LDL Particle Number: 1160 nmol/L — ABNORMAL HIGH (ref <1000)
LDL Size: 20.5 nm (ref >20.5)
LDL-C: 82 mg/dL (ref 0–99)
LP-IR Score: 79 — ABNORMAL HIGH (ref <=45)
Small LDL Particle Number: 589 nmol/L — ABNORMAL HIGH (ref <=527)
TRIGLYCERIDES BY NMR: 134 mg/dL (ref 0–149)

## 2014-10-24 LAB — CMP14+EGFR
A/G RATIO: 1.9 (ref 1.1–2.5)
ALBUMIN: 4.4 g/dL (ref 3.6–4.8)
ALT: 25 IU/L (ref 0–44)
AST: 21 IU/L (ref 0–40)
Alkaline Phosphatase: 74 IU/L (ref 39–117)
BUN/Creatinine Ratio: 18 (ref 10–22)
BUN: 15 mg/dL (ref 8–27)
Bilirubin Total: 0.6 mg/dL (ref 0.0–1.2)
CALCIUM: 9.1 mg/dL (ref 8.6–10.2)
CO2: 25 mmol/L (ref 18–29)
CREATININE: 0.83 mg/dL (ref 0.76–1.27)
Chloride: 101 mmol/L (ref 97–108)
GFR, EST AFRICAN AMERICAN: 105 mL/min/{1.73_m2} (ref >59)
GFR, EST NON AFRICAN AMERICAN: 91 mL/min/{1.73_m2} (ref >59)
GLOBULIN, TOTAL: 2.3 g/dL (ref 1.5–4.5)
GLUCOSE: 82 mg/dL (ref 65–99)
POTASSIUM: 4.8 mmol/L (ref 3.5–5.2)
Sodium: 139 mmol/L (ref 134–144)
Total Protein: 6.7 g/dL (ref 6.0–8.5)

## 2014-10-27 ENCOUNTER — Encounter (HOSPITAL_COMMUNITY): Payer: Medicare Other

## 2014-10-29 ENCOUNTER — Encounter (HOSPITAL_COMMUNITY): Payer: Medicare Other

## 2014-10-31 ENCOUNTER — Encounter (HOSPITAL_COMMUNITY): Payer: Medicare Other

## 2014-11-03 ENCOUNTER — Encounter (HOSPITAL_COMMUNITY): Payer: Medicare Other

## 2014-11-05 ENCOUNTER — Encounter (HOSPITAL_COMMUNITY): Payer: Medicare Other

## 2014-11-07 ENCOUNTER — Encounter (HOSPITAL_COMMUNITY): Payer: Medicare Other

## 2014-11-10 ENCOUNTER — Encounter (HOSPITAL_COMMUNITY): Payer: Medicare Other

## 2014-11-12 ENCOUNTER — Encounter (HOSPITAL_COMMUNITY): Payer: Medicare Other

## 2014-11-14 ENCOUNTER — Encounter (HOSPITAL_COMMUNITY): Payer: Medicare Other

## 2014-11-17 ENCOUNTER — Encounter (HOSPITAL_COMMUNITY): Payer: Medicare Other

## 2014-11-19 ENCOUNTER — Encounter (HOSPITAL_COMMUNITY): Payer: Medicare Other

## 2014-11-21 ENCOUNTER — Encounter (HOSPITAL_COMMUNITY): Payer: Medicare Other

## 2014-11-24 ENCOUNTER — Encounter (HOSPITAL_COMMUNITY): Payer: Medicare Other

## 2014-11-26 ENCOUNTER — Encounter (HOSPITAL_COMMUNITY): Payer: Medicare Other

## 2014-11-27 ENCOUNTER — Encounter: Payer: Self-pay | Admitting: *Deleted

## 2014-11-28 ENCOUNTER — Encounter (HOSPITAL_COMMUNITY): Payer: Medicare Other

## 2015-01-21 ENCOUNTER — Encounter: Payer: Self-pay | Admitting: Cardiovascular Disease

## 2015-01-21 ENCOUNTER — Ambulatory Visit (INDEPENDENT_AMBULATORY_CARE_PROVIDER_SITE_OTHER): Payer: Medicare Other | Admitting: Cardiovascular Disease

## 2015-01-21 VITALS — BP 143/69 | HR 51 | Ht 69.0 in | Wt 189.0 lb

## 2015-01-21 DIAGNOSIS — I252 Old myocardial infarction: Secondary | ICD-10-CM | POA: Diagnosis not present

## 2015-01-21 DIAGNOSIS — I2510 Atherosclerotic heart disease of native coronary artery without angina pectoris: Secondary | ICD-10-CM

## 2015-01-21 DIAGNOSIS — Z955 Presence of coronary angioplasty implant and graft: Secondary | ICD-10-CM | POA: Diagnosis not present

## 2015-01-21 DIAGNOSIS — I10 Essential (primary) hypertension: Secondary | ICD-10-CM

## 2015-01-21 DIAGNOSIS — E785 Hyperlipidemia, unspecified: Secondary | ICD-10-CM

## 2015-01-21 DIAGNOSIS — I255 Ischemic cardiomyopathy: Secondary | ICD-10-CM

## 2015-01-21 NOTE — Patient Instructions (Signed)
Your physician wants you to follow-up in: 4 months with Dr. Koneswaran. You will receive a reminder letter in the mail two months in advance. If you don't receive a letter, please call our office to schedule the follow-up appointment.  Your physician recommends that you continue on your current medications as directed. Please refer to the Current Medication list given to you today.  Thank you for choosing [facility]!!    

## 2015-01-21 NOTE — Progress Notes (Signed)
Patient ID: Adam Rowland, male   DOB: Jul 19, 1946, 69 y.o.   MRN: [id number]      SUBJECTIVE: Adam Rowland returns for routine cardiovascular follow-up. He has a history of a non-STEMI in 05/2014 and underwent coronary artery stent placement to the LAD. He has significant residual disease of the left circumflex and first obtuse marginal, and it was felt that a staged approach to additional intervention would be appropriate once he fully recovered from his myocardial infarction. He has an ischemic cardiomyopathy, EF 45-50%. He also has hypertension and hyperlipidemia. He denies chest pain and shortness of breath.   He continues to ride a stationary bicycle in his house 30 minutes daily without any difficulties whatsoever. He has also been push mowing his lawn and weeding without symptoms.   Review of Systems: As per "subjective", otherwise negative.  No Known Allergies  Current Outpatient Prescriptions  Medication Sig Dispense Refill  . aspirin EC 81 MG EC tablet Take 1 tablet (81 mg total) by mouth daily.    Marland Kitchen atorvastatin (LIPITOR) 40 MG tablet Take 1 tablet (40 mg total) by mouth daily. 90 tablet 3  . doxycycline (VIBRAMYCIN) 100 MG capsule Take 1 capsule by mouth daily.  0  . lisinopril (PRINIVIL,ZESTRIL) 10 MG tablet TAKE 1 TABLET DAILY 30 tablet 6  . metoprolol tartrate (LOPRESSOR) 25 MG tablet Take 1 tablet (25 mg total) by mouth 2 (two) times daily. 60 tablet 6  . nitroGLYCERIN (NITROSTAT) 0.4 MG SL tablet Place 1 tablet (0.4 mg total) under the tongue every 5 (five) minutes x 3 doses as needed for chest pain. 25 tablet 3  . prasugrel (EFFIENT) 10 MG TABS tablet Take 1 tablet (10 mg total) by mouth daily. 90 tablet 3  . triamcinolone cream (KENALOG) 0.1 % Apply 1 application topically 2 (two) times daily as needed.  1   No current facility-administered medications for this visit.    Past Medical History  Diagnosis Date  . Hyperlipidemia   . Hypertension   . Cardiomyopathy, ischemic       a. 05/2014 Echo: Ef 45-50%, mid-apicalanteroseptal AK, Gr 2 DD.  Marland Kitchen CAD (coronary artery disease)     a. 05/2014 NSTEMI/PCI: LM nl, LAD 74m (3.0x23 Xience Alpine DES), D1 90, LCX 59m, OM1 80, RCA 30p/d, RPDA 40-50p, EF 40-45% ->Plan for staged PCI of LCX/OM1.  . Nephrolithiasis 01/16/2013  . NSVT (nonsustained ventricular tachycardia)     a. 05/2014 in setting of NSTEMI -  asymptomatic.    Past Surgical History  Procedure Laterality Date  . Nasal septum surgery    . Ankle fracture surgery    . Cardiac catheterization    . Coronary stent placement    . Left heart catheterization with coronary angiogram N/A 06/20/2014    Procedure: LEFT HEART CATHETERIZATION WITH CORONARY ANGIOGRAM;  Surgeon: Peter M Swaziland, MD;  Location: [facility] CATH LAB;  Service: Cardiovascular;  Laterality: N/A;  . Cardiac catheterization Right 06/20/2014    Procedure: CORONARY STENT INTERVENTION;  Surgeon: Peter M Swaziland, MD;  Location: [facility] CATH LAB;  Service: Cardiovascular;  Laterality: Right;    History   Social History  . Marital Status: Married    Spouse Name: N/A  . Number of Children: N/A  . Years of Education: N/A   Occupational History  . Not on file.   Social History Main Topics  . Smoking status: Former Smoker -- 1.00 packs/day for 23 years    Types: Cigarettes    Start date: 10/26/1964  Quit date: 08/30/1987  . Smokeless tobacco: Former Neurosurgeon    Types: Chew    Quit date: 02/27/1988     Comment: quit in 1989, smoked since he was a teenager/chewed tobacco for about 6 months after he quit smoking cigarettes  . Alcohol Use: 4.2 oz/week    7 Glasses of wine per week     Comment: 1 glass of wine nightly   . Drug Use: No  . Sexual Activity: Not on file   Other Topics Concern  . Not on file   Social History Narrative     Filed Vitals:   01/21/15 0804  BP: 143/69  Pulse: 51  Height: 5\' 9"  (1.753 m)  Weight: 189 lb (85.73 kg)    PHYSICAL EXAM General: NAD HEENT: Normal. Neck: No JVD,  no thyromegaly. Lungs: Clear to auscultation bilaterally with normal respiratory effort. CV: Nondisplaced PMI.  Regular rate and rhythm, normal S1/S2, no S3/S4, no murmur. No pretibial or periankle edema.  No carotid bruit.  Normal pedal pulses.  Abdomen: Soft, nontender, no hepatosplenomegaly, no distention.  Neurologic: Alert and oriented x 3.  Psych: Normal affect. Skin: Normal. Musculoskeletal: Normal range of motion, no gross deformities. Extremities: No clubbing or cyanosis.   ECG: Most recent ECG reviewed.      ASSESSMENT AND PLAN: 1. CAD s/p LAD stent for NSTEMI: Stable ischemic heart disease. Continue aspirin, Lipitor, lisinopril, metoprolol, and Effient. He may eventually require a staged intervention of the left circumflex coronary artery and first obtuse marginal branch if optimal medical therapy fails.  2. Essential HTN: Mildly elevated on current therapy which includes lisinopril 10 mg daily. Will monitor. No changes today.  3. Hyperlipidemia: Lipids checked on 06/20/14 with LDL 49. Continue Lipitor 40 mg daily.  4. Ischemic cardiomyopathy: Symptomatically stable with no evidence of heart failure. No changes to therapy which included an ACE inhibitor and beta blocker.  Dispo: f/u 4 months.   Prentice Docker, M.D., F.A.C.C.

## 2015-02-16 ENCOUNTER — Ambulatory Visit (INDEPENDENT_AMBULATORY_CARE_PROVIDER_SITE_OTHER): Payer: Medicare Other | Admitting: Nurse Practitioner

## 2015-02-16 ENCOUNTER — Encounter: Payer: Self-pay | Admitting: Nurse Practitioner

## 2015-02-16 VITALS — BP 128/59 | HR 61 | Temp 97.3°F | Ht 69.0 in | Wt 188.0 lb

## 2015-02-16 DIAGNOSIS — E785 Hyperlipidemia, unspecified: Secondary | ICD-10-CM

## 2015-02-16 DIAGNOSIS — Z23 Encounter for immunization: Secondary | ICD-10-CM | POA: Diagnosis not present

## 2015-02-16 DIAGNOSIS — I2510 Atherosclerotic heart disease of native coronary artery without angina pectoris: Secondary | ICD-10-CM | POA: Diagnosis not present

## 2015-02-16 DIAGNOSIS — I10 Essential (primary) hypertension: Secondary | ICD-10-CM | POA: Diagnosis not present

## 2015-02-16 MED ORDER — LISINOPRIL 10 MG PO TABS: ORAL_TABLET | ORAL | Status: DC

## 2015-02-16 MED ORDER — ATORVASTATIN CALCIUM 40 MG PO TABS: 40.0000 mg | ORAL_TABLET | Freq: Every day | ORAL | Status: DC

## 2015-02-16 MED ORDER — METOPROLOL TARTRATE 25 MG PO TABS: 25.0000 mg | ORAL_TABLET | Freq: Two times a day (BID) | ORAL | Status: DC

## 2015-02-16 NOTE — Patient Instructions (Signed)
Fat and Cholesterol Control Diet Fat and cholesterol levels in your blood and organs are influenced by your diet. High levels of fat and cholesterol may lead to diseases of the heart, small and large blood vessels, gallbladder, liver, and pancreas. CONTROLLING FAT AND CHOLESTEROL WITH DIET Although exercise and lifestyle factors are important, your diet is key. That is because certain foods are known to raise cholesterol and others to lower it. The goal is to balance foods for their effect on cholesterol and more importantly, to replace saturated and trans fat with other types of fat, such as monounsaturated fat, polyunsaturated fat, and omega-3 fatty acids. On average, a person should consume no more than 15 to 17 g of saturated fat daily. Saturated and trans fats are considered "bad" fats, and they will raise LDL cholesterol. Saturated fats are primarily found in animal products such as meats, butter, and cream. However, that does not mean you need to give up all your favorite foods. Today, there are good tasting, low-fat, low-cholesterol substitutes for most of the things you like to eat. Choose low-fat or nonfat alternatives. Choose round or loin cuts of red meat. These types of cuts are lowest in fat and cholesterol. Chicken (without the skin), fish, veal, and ground turkey breast are great choices. Eliminate fatty meats, such as hot dogs and salami. Even shellfish have little or no saturated fat. Have a 3 oz (85 g) portion when you eat lean meat, poultry, or fish. Trans fats are also called "partially hydrogenated oils." They are oils that have been scientifically manipulated so that they are solid at room temperature resulting in a longer shelf life and improved taste and texture of foods in which they are added. Trans fats are found in stick margarine, some tub margarines, cookies, crackers, and baked goods.  When baking and cooking, oils are a great substitute for butter. The monounsaturated oils are  especially beneficial since it is believed they lower LDL and raise HDL. The oils you should avoid entirely are saturated tropical oils, such as coconut and palm.  Remember to eat a lot from food groups that are naturally free of saturated and trans fat, including fish, fruit, vegetables, beans, grains (barley, rice, couscous, bulgur wheat), and pasta (without cream sauces).  IDENTIFYING FOODS THAT LOWER FAT AND CHOLESTEROL  Soluble fiber may lower your cholesterol. This type of fiber is found in fruits such as apples, vegetables such as broccoli, potatoes, and carrots, legumes such as beans, peas, and lentils, and grains such as barley. Foods fortified with plant sterols (phytosterol) may also lower cholesterol. You should eat at least 2 g per day of these foods for a cholesterol lowering effect.  Read package labels to identify low-saturated fats, trans fat free, and low-fat foods at the supermarket. Select cheeses that have only 2 to 3 g saturated fat per ounce. Use a heart-healthy tub margarine that is free of trans fats or partially hydrogenated oil. When buying baked goods (cookies, crackers), avoid partially hydrogenated oils. Breads and muffins should be made from whole grains (whole-wheat or whole oat flour, instead of "flour" or "enriched flour"). Buy non-creamy canned soups with reduced salt and no added fats.  FOOD PREPARATION TECHNIQUES  Never deep-fry. If you must fry, either stir-fry, which uses very little fat, or use non-stick cooking sprays. When possible, broil, bake, or roast meats, and steam vegetables. Instead of putting butter or margarine on vegetables, use lemon and herbs, applesauce, and cinnamon (for squash and sweet potatoes). Use nonfat   yogurt, salsa, and low-fat dressings for salads.  LOW-SATURATED FAT / LOW-FAT FOOD SUBSTITUTES Meats / Saturated Fat (g)  Avoid: Steak, marbled (3 oz/85 g) / 11 g  Choose: Steak, lean (3 oz/85 g) / 4 g  Avoid: Hamburger (3 oz/85 g) / 7  g  Choose: Hamburger, lean (3 oz/85 g) / 5 g  Avoid: Ham (3 oz/85 g) / 6 g  Choose: Ham, lean cut (3 oz/85 g) / 2.4 g  Avoid: Chicken, with skin, dark meat (3 oz/85 g) / 4 g  Choose: Chicken, skin removed, dark meat (3 oz/85 g) / 2 g  Avoid: Chicken, with skin, light meat (3 oz/85 g) / 2.5 g  Choose: Chicken, skin removed, light meat (3 oz/85 g) / 1 g Dairy / Saturated Fat (g)  Avoid: Whole milk (1 cup) / 5 g  Choose: Low-fat milk, 2% (1 cup) / 3 g  Choose: Low-fat milk, 1% (1 cup) / 1.5 g  Choose: Skim milk (1 cup) / 0.3 g  Avoid: Hard cheese (1 oz/28 g) / 6 g  Choose: Skim milk cheese (1 oz/28 g) / 2 to 3 g  Avoid: Cottage cheese, 4% fat (1 cup) / 6.5 g  Choose: Low-fat cottage cheese, 1% fat (1 cup) / 1.5 g  Avoid: Ice cream (1 cup) / 9 g  Choose: Sherbet (1 cup) / 2.5 g  Choose: Nonfat frozen yogurt (1 cup) / 0.3 g  Choose: Frozen fruit bar / trace  Avoid: Whipped cream (1 tbs) / 3.5 g  Choose: Nondairy whipped topping (1 tbs) / 1 g Condiments / Saturated Fat (g)  Avoid: Mayonnaise (1 tbs) / 2 g  Choose: Low-fat mayonnaise (1 tbs) / 1 g  Avoid: Butter (1 tbs) / 7 g  Choose: Extra light margarine (1 tbs) / 1 g  Avoid: Coconut oil (1 tbs) / 11.8 g  Choose: Olive oil (1 tbs) / 1.8 g  Choose: Corn oil (1 tbs) / 1.7 g  Choose: Safflower oil (1 tbs) / 1.2 g  Choose: Sunflower oil (1 tbs) / 1.4 g  Choose: Soybean oil (1 tbs) / 2.4 g  Choose: Canola oil (1 tbs) / 1 g Document Released: 08/15/2005 Document Revised: 12/10/2012 Document Reviewed: 11/13/2013 Exit[employer] Patient Information 2015 [employer]. This information is not intended to replace advice given to you by your health care provider. Make sure you discuss any questions you have with your health care provider.  

## 2015-02-16 NOTE — Addendum Note (Signed)
Addended by: Bennie Pierini on: 02/16/2015 09:33 AM   Modules accepted: Level of Service

## 2015-02-16 NOTE — Addendum Note (Signed)
Addended by: Bennie Pierini on: 02/16/2015 08:45 AM   Modules accepted: Orders

## 2015-02-16 NOTE — Progress Notes (Signed)
Subjective:    Patient ID: Adam Rowland, male    DOB: Jan 20, 1946, 69 y.o.   MRN: [id number]  Patient here today for follow up of chronic medical problems.   Hyperlipidemia This is a chronic problem. The problem is controlled. Recent lipid tests were reviewed and are normal. He has no history of diabetes, hypothyroidism or obesity. Pertinent negatives include no chest pain, myalgias or shortness of breath. Current antihyperlipidemic treatment includes statins. The current treatment provides moderate improvement of lipids. Compliance problems include adherence to diet and adherence to exercise.  Risk factors for coronary artery disease include dyslipidemia, family history, hypertension and male sex.  Hypertension This is a chronic problem. The current episode started more than 1 year ago. The problem is unchanged. The problem is controlled. Pertinent negatives include no chest pain, palpitations or shortness of breath. There are no associated agents to hypertension. Past treatments include ACE inhibitors. The current treatment provides moderate improvement. Compliance problems include diet and exercise.  Hypertensive end-organ damage includes CAD/MI.  CAD Fell and broke ankle and doing having surgery was found to have had heart attack in past; did cardiac cath and found blockage 99 % in LAD and had stent put in and he also had two arteries that were 80% occluded and they are going to watch those. Saw cardiologist 3 weeks ago and was doing well.     Review of Systems  Constitutional: Positive for diaphoresis. Negative for fatigue.  HENT: Negative.   Eyes: Negative.   Respiratory: Negative for chest tightness and shortness of breath.   Cardiovascular: Negative for chest pain and palpitations.  Endocrine: Negative.   Genitourinary: Negative.   Musculoskeletal: Negative.  Negative for myalgias.  Neurological: Negative.   Hematological: Negative.   Psychiatric/Behavioral: Negative.   All other  systems reviewed and are negative.      Objective:   Physical Exam  Constitutional: He is oriented to person, place, and time. He appears well-developed and well-nourished.  HENT:  Head: Normocephalic.  Right Ear: External ear normal.  Left Ear: External ear normal.  Nose: Nose normal.  Mouth/Throat: Oropharynx is clear and moist.  Eyes: EOM are normal. Pupils are equal, round, and reactive to light.  Neck: Normal range of motion. Neck supple. No JVD present. No thyromegaly present.  Cardiovascular: Normal rate, regular rhythm, normal heart sounds and intact distal pulses.  Exam reveals no gallop and no friction rub.   No murmur heard. Pulmonary/Chest: Effort normal and breath sounds normal. No respiratory distress. He has no wheezes. He has no rales. He exhibits no tenderness.  Abdominal: Soft. Bowel sounds are normal. He exhibits no mass. There is no tenderness.  Genitourinary: Prostate normal and penis normal.  Musculoskeletal: Normal range of motion. He exhibits edema (left lower ext- 1+).  Lymphadenopathy:    He has no cervical adenopathy.  Neurological: He is alert and oriented to person, place, and time. No cranial nerve deficit.  Skin: Skin is warm and dry.  Psychiatric: He has a normal mood and affect. His behavior is normal. Judgment and thought content normal.   BP 128/59 mmHg  Pulse 61  Temp(Src) 97.3 F (36.3 C) (Oral)  Ht 5\' 9"  (1.753 m)  Wt 188 lb (85.276 kg)  BMI 27.75 kg/m2         Assessment & Plan:   1. Essential hypertension Do not add salt  To diet - lisinopril (PRINIVIL,ZESTRIL) 10 MG tablet; TAKE 1 TABLET DAILY  Dispense: 90 tablet; Refill: 1  2.  Hyperlipidemia Low fat diet - atorvastatin (LIPITOR) 40 MG tablet; Take 1 tablet (40 mg total) by mouth daily.  Dispense: 90 tablet; Refill: 1  3. Coronary artery disease involving native coronary artery of native heart without angina pectoris Keep follow up appointment with cardiologist - metoprolol  tartrate (LOPRESSOR) 25 MG tablet; Take 1 tablet (25 mg total) by mouth 2 (two) times daily.  Dispense: 180 tablet; Refill: 1    Labs pending Health maintenance reviewed Diet and exercise encouraged Continue all meds Follow up  In 3 months   [name] Daphine Deutscher, FNP

## 2015-02-16 NOTE — Addendum Note (Signed)
Addended by: Cleda Daub on: 02/16/2015 12:15 PM   Modules accepted: Orders

## 2015-02-17 LAB — CMP14+EGFR
ALK PHOS: 57 IU/L (ref 39–117)
ALT: 24 IU/L (ref 0–44)
AST: 20 IU/L (ref 0–40)
Albumin/Globulin Ratio: 1.8 (ref 1.1–2.5)
Albumin: 4.2 g/dL (ref 3.6–4.8)
BILIRUBIN TOTAL: 0.6 mg/dL (ref 0.0–1.2)
BUN / CREAT RATIO: 21 (ref 10–22)
BUN: 14 mg/dL (ref 8–27)
CHLORIDE: 101 mmol/L (ref 97–108)
CO2: 22 mmol/L (ref 18–29)
Calcium: 9 mg/dL (ref 8.6–10.2)
Creatinine, Ser: 0.68 mg/dL — ABNORMAL LOW (ref 0.76–1.27)
GFR calc non Af Amer: 98 mL/min/{1.73_m2} (ref >59)
GFR, EST AFRICAN AMERICAN: 113 mL/min/{1.73_m2} (ref >59)
GLUCOSE: 84 mg/dL (ref 65–99)
Globulin, Total: 2.3 g/dL (ref 1.5–4.5)
Potassium: 4.9 mmol/L (ref 3.5–5.2)
Sodium: 138 mmol/L (ref 134–144)
TOTAL PROTEIN: 6.5 g/dL (ref 6.0–8.5)

## 2015-02-17 LAB — LIPID PANEL
Chol/HDL Ratio: 4.3 ratio units (ref 0.0–5.0)
Cholesterol, Total: 153 mg/dL (ref 100–199)
HDL: 36 mg/dL — ABNORMAL LOW (ref >39)
LDL Calculated: 84 mg/dL (ref 0–99)
TRIGLYCERIDES: 163 mg/dL — AB (ref 0–149)
VLDL Cholesterol Cal: 33 mg/dL (ref 5–40)

## 2015-03-25 ENCOUNTER — Encounter: Payer: Self-pay | Admitting: *Deleted

## 2015-05-21 ENCOUNTER — Ambulatory Visit (INDEPENDENT_AMBULATORY_CARE_PROVIDER_SITE_OTHER): Payer: Medicare Other | Admitting: Nurse Practitioner

## 2015-05-21 ENCOUNTER — Encounter: Payer: Self-pay | Admitting: Nurse Practitioner

## 2015-05-21 VITALS — BP 128/68 | HR 65 | Temp 97.3°F | Ht 69.0 in | Wt 188.0 lb

## 2015-05-21 DIAGNOSIS — I2510 Atherosclerotic heart disease of native coronary artery without angina pectoris: Secondary | ICD-10-CM | POA: Diagnosis not present

## 2015-05-21 DIAGNOSIS — R7309 Other abnormal glucose: Secondary | ICD-10-CM | POA: Diagnosis not present

## 2015-05-21 DIAGNOSIS — I255 Ischemic cardiomyopathy: Secondary | ICD-10-CM | POA: Diagnosis not present

## 2015-05-21 DIAGNOSIS — E785 Hyperlipidemia, unspecified: Secondary | ICD-10-CM | POA: Diagnosis not present

## 2015-05-21 DIAGNOSIS — Z6827 Body mass index (BMI) 27.0-27.9, adult: Secondary | ICD-10-CM | POA: Diagnosis not present

## 2015-05-21 DIAGNOSIS — R739 Hyperglycemia, unspecified: Secondary | ICD-10-CM

## 2015-05-21 DIAGNOSIS — I10 Essential (primary) hypertension: Secondary | ICD-10-CM | POA: Diagnosis not present

## 2015-05-21 NOTE — Patient Instructions (Signed)

## 2015-05-21 NOTE — Progress Notes (Signed)
Subjective:    Patient ID: Adam Rowland, male    DOB: 1946-05-06, 69 y.o.   MRN: [id number]  Patient here today for follow up of chronic medical problems.   Hyperlipidemia This is a chronic problem. The problem is controlled. Recent lipid tests were reviewed and are normal. He has no history of diabetes, hypothyroidism or obesity. Pertinent negatives include no chest pain, myalgias or shortness of breath. Current antihyperlipidemic treatment includes statins. The current treatment provides moderate improvement of lipids. Compliance problems include adherence to diet and adherence to exercise.  Risk factors for coronary artery disease include dyslipidemia, family history, hypertension and male sex.  Hypertension This is a chronic problem. The current episode started more than 1 year ago. The problem is unchanged. The problem is controlled. Pertinent negatives include no chest pain, palpitations or shortness of breath. There are no associated agents to hypertension. Past treatments include ACE inhibitors. The current treatment provides moderate improvement. Compliance problems include diet and exercise.  Hypertensive end-organ damage includes CAD/MI.  CAD Fell and broke ankle and doing having surgery was found to have had heart attack in past; did cardiac cath and found blockage 99 % in LAD and had stent put in and he also had two arteries that were 80% occluded and they are going to watch those. Saw cardiologist 3 weeks ago and was doing well.     Review of Systems  Constitutional: Positive for diaphoresis. Negative for fatigue.  HENT: Negative.   Eyes: Negative.   Respiratory: Negative for chest tightness and shortness of breath.   Cardiovascular: Negative for chest pain and palpitations.  Endocrine: Negative.   Genitourinary: Negative.   Musculoskeletal: Negative.  Negative for myalgias.  Neurological: Negative.   Hematological: Negative.   Psychiatric/Behavioral: Negative.   All other  systems reviewed and are negative.      Objective:   Physical Exam  Constitutional: He is oriented to person, place, and time. He appears well-developed and well-nourished.  HENT:  Head: Normocephalic.  Right Ear: External ear normal.  Left Ear: External ear normal.  Nose: Nose normal.  Mouth/Throat: Oropharynx is clear and moist.  Eyes: EOM are normal. Pupils are equal, round, and reactive to light.  Neck: Normal range of motion. Neck supple. No JVD present. No thyromegaly present.  Cardiovascular: Normal rate, regular rhythm, normal heart sounds and intact distal pulses.  Exam reveals no gallop and no friction rub.   No murmur heard. Pulmonary/Chest: Effort normal and breath sounds normal. No respiratory distress. He has no wheezes. He has no rales. He exhibits no tenderness.  Abdominal: Soft. Bowel sounds are normal. He exhibits no mass. There is no tenderness.  Genitourinary: Prostate normal and penis normal.  Musculoskeletal: Normal range of motion. He exhibits edema (left lower ext- 1+).  Lymphadenopathy:    He has no cervical adenopathy.  Neurological: He is alert and oriented to person, place, and time. No cranial nerve deficit.  Skin: Skin is warm and dry.  Psychiatric: He has a normal mood and affect. His behavior is normal. Judgment and thought content normal.   BP 128/68 mmHg  Pulse 65  Temp(Src) 97.3 F (36.3 C) (Oral)  Ht _0  (1.753 m)  Wt 188 lb (85.276 kg)  BMI 27.75 kg/m2       Assessment & Plan:   1. Essential hypertension Do not add salt to ddeit - CMP14+EGFR  2. Coronary artery disease involving native coronary artery of native heart without angina pectoris  3. Cardiomyopathy, ischemic  4. Hyperlipidemia Low fat diet - Lipid panel  5. BMI 27.0-27.9,adult Discussed diet and exercise for person with BMI >25 Will recheck weight in 3-6 months     Labs pending Health maintenance reviewed Diet and exercise encouraged Continue all  meds Follow up  In 3 months   [place], FNP

## 2015-05-22 LAB — CMP14+EGFR
A/G RATIO: 2 (ref 1.1–2.5)
ALT: 20 IU/L (ref 0–44)
AST: 21 IU/L (ref 0–40)
Albumin: 4.5 g/dL (ref 3.6–4.8)
Alkaline Phosphatase: 56 IU/L (ref 39–117)
BUN / CREAT RATIO: 19 (ref 10–22)
BUN: 15 mg/dL (ref 8–27)
Bilirubin Total: 0.8 mg/dL (ref 0.0–1.2)
CALCIUM: 9.1 mg/dL (ref 8.6–10.2)
CO2: 24 mmol/L (ref 18–29)
Chloride: 101 mmol/L (ref 97–108)
Creatinine, Ser: 0.78 mg/dL (ref 0.76–1.27)
GFR, EST AFRICAN AMERICAN: 107 mL/min/{1.73_m2} (ref >59)
GFR, EST NON AFRICAN AMERICAN: 93 mL/min/{1.73_m2} (ref >59)
GLOBULIN, TOTAL: 2.2 g/dL (ref 1.5–4.5)
Glucose: 115 mg/dL — ABNORMAL HIGH (ref 65–99)
POTASSIUM: 4.8 mmol/L (ref 3.5–5.2)
SODIUM: 139 mmol/L (ref 134–144)
Total Protein: 6.7 g/dL (ref 6.0–8.5)

## 2015-05-22 LAB — LIPID PANEL
CHOL/HDL RATIO: 4.3 ratio (ref 0.0–5.0)
Cholesterol, Total: 152 mg/dL (ref 100–199)
HDL: 35 mg/dL — ABNORMAL LOW (ref >39)
LDL Calculated: 85 mg/dL (ref 0–99)
TRIGLYCERIDES: 161 mg/dL — AB (ref 0–149)
VLDL Cholesterol Cal: 32 mg/dL (ref 5–40)

## 2015-05-26 ENCOUNTER — Encounter: Payer: Self-pay | Admitting: Cardiovascular Disease

## 2015-05-26 ENCOUNTER — Ambulatory Visit (INDEPENDENT_AMBULATORY_CARE_PROVIDER_SITE_OTHER): Payer: Medicare Other | Admitting: Cardiovascular Disease

## 2015-05-26 VITALS — BP 129/73 | HR 66 | Ht 69.0 in | Wt 188.0 lb

## 2015-05-26 DIAGNOSIS — I252 Old myocardial infarction: Secondary | ICD-10-CM | POA: Diagnosis not present

## 2015-05-26 DIAGNOSIS — I2510 Atherosclerotic heart disease of native coronary artery without angina pectoris: Secondary | ICD-10-CM | POA: Diagnosis not present

## 2015-05-26 DIAGNOSIS — Z955 Presence of coronary angioplasty implant and graft: Secondary | ICD-10-CM

## 2015-05-26 DIAGNOSIS — E785 Hyperlipidemia, unspecified: Secondary | ICD-10-CM

## 2015-05-26 DIAGNOSIS — I10 Essential (primary) hypertension: Secondary | ICD-10-CM

## 2015-05-26 DIAGNOSIS — I519 Heart disease, unspecified: Secondary | ICD-10-CM

## 2015-05-26 NOTE — Patient Instructions (Signed)
Continue all current medications. Your physician wants you to follow up in: 6 months.  You will receive a reminder letter in the mail one-two months in advance.  If you don't receive a letter, please call our office to schedule the follow up appointment   

## 2015-05-26 NOTE — Progress Notes (Signed)
Patient ID: Adam Rowland, male   DOB: Jan 11, 1946, 69 y.o.   MRN: [id number]      SUBJECTIVE: Adam Rowland returns for routine cardiovascular follow-up. He has a history of a non-STEMI in 05/2014 and underwent coronary artery stent placement to the LAD. He has significant residual disease of the left circumflex and first obtuse marginal, and it was felt that a staged approach to additional intervention would be appropriate once he fully recovered from his myocardial infarction. He has an ischemic cardiomyopathy, EF 45-50%. He also has hypertension and hyperlipidemia.  He denies chest pain, palpitations, leg swelling, and shortness of breath.  He has been exercising on his recumbent bicycle 30 minutes at a time nearly every day of the week.   Review of Systems: As per "subjective", otherwise negative.  No Known Allergies  Current Outpatient Prescriptions  Medication Sig Dispense Refill  . aspirin EC 81 MG EC tablet Take 1 tablet (81 mg total) by mouth daily.    Marland Kitchen atorvastatin (LIPITOR) 40 MG tablet Take 1 tablet (40 mg total) by mouth daily. 90 tablet 1  . lisinopril (PRINIVIL,ZESTRIL) 10 MG tablet TAKE 1 TABLET DAILY 90 tablet 1  . metoprolol tartrate (LOPRESSOR) 25 MG tablet Take 1 tablet (25 mg total) by mouth 2 (two) times daily. 180 tablet 1  . nitroGLYCERIN (NITROSTAT) 0.4 MG SL tablet Place 1 tablet (0.4 mg total) under the tongue every 5 (five) minutes x 3 doses as needed for chest pain. 25 tablet 3  . prasugrel (EFFIENT) 10 MG TABS tablet Take 1 tablet (10 mg total) by mouth daily. 90 tablet 3   No current facility-administered medications for this visit.    Past Medical History  Diagnosis Date  . Hyperlipidemia   . Hypertension   . Cardiomyopathy, ischemic     a. 05/2014 Echo: Ef 45-50%, mid-apicalanteroseptal AK, Gr 2 DD.  Marland Kitchen CAD (coronary artery disease)     a. 05/2014 NSTEMI/PCI: LM nl, LAD 40m (3.0x23 Xience Alpine DES), D1 90, LCX 1m, OM1 80, RCA 30p/d, RPDA 40-50p, EF 40-45%  ->Plan for staged PCI of LCX/OM1.  . Nephrolithiasis 01/16/2013  . NSVT (nonsustained ventricular tachycardia)     a. 05/2014 in setting of NSTEMI -  asymptomatic.    Past Surgical History  Procedure Laterality Date  . Nasal septum surgery    . Ankle fracture surgery    . Cardiac catheterization    . Coronary stent placement    . Left heart catheterization with coronary angiogram N/A 06/20/2014    Procedure: LEFT HEART CATHETERIZATION WITH CORONARY ANGIOGRAM;  Surgeon: Peter M Swaziland, MD;  Location: [facility] CATH LAB;  Service: Cardiovascular;  Laterality: N/A;  . Cardiac catheterization Right 06/20/2014    Procedure: CORONARY STENT INTERVENTION;  Surgeon: Peter M [place]az[place], MD;  Location: [facility] CATH LAB;  Service: Cardiovascular;  Laterality: Right;    Social History   Social History  . Marital Status: Married    Spouse Name: N/A  . Number of Children: N/A  . Years of Education: N/A   Occupational History  . Not on file.   Social History Main Topics  . Smoking status: Former Smoker -- 1.00 packs/day for 23 years    Types: Cigarettes    Start date: 10/26/1964    Quit date: 08/30/1987  . Smokeless tobacco: Former Neurosurgeon    Types: Chew    Quit date: 02/27/1988     Comment: quit in 1989, smoked since he was a teenager/chewed tobacco for about 6 months  after he quit smoking cigarettes  . Alcohol Use: 4.2 oz/week    7 Glasses of wine per week     Comment: 1 glass of wine nightly   . Drug Use: No  . Sexual Activity: Not on file   Other Topics Concern  . Not on file   Social History Narrative     Filed Vitals:   05/26/15 0818  BP: 129/73  Pulse: 66  Height: 5\' 9"  (1.753 m)  Weight: 188 lb (85.276 kg)    PHYSICAL EXAM General: NAD HEENT: Normal. Neck: No JVD, no thyromegaly. Lungs: Clear to auscultation bilaterally with normal respiratory effort. CV: Nondisplaced PMI.  Regular rate and rhythm, normal S1/S2, no S3/S4, no murmur. No pretibial or periankle edema.  No carotid  bruit.  Normal pedal pulses.  Abdomen: Soft, nontender, no hepatosplenomegaly, no distention.  Neurologic: Alert and oriented x 3.  Psych: Normal affect. Skin: Normal. Musculoskeletal: Normal range of motion, no gross deformities. Extremities: No clubbing or cyanosis.   ECG: Most recent ECG reviewed.      ASSESSMENT AND PLAN: 1. CAD s/p LAD stent for NSTEMI: Stable ischemic heart disease. Continue aspirin, Lipitor, lisinopril, metoprolol, and Effient. He may eventually require a staged intervention of the left circumflex coronary artery and first obtuse marginal branch if optimal medical therapy fails.  2. Essential HTN: Well controlled. No changes today.  3. Hyperlipidemia: Lipids checked on 05/21/15 with LDL 85. Encouraged dietary modification. Continue Lipitor 40 mg daily.  4. Ischemic cardiomyopathy: Symptomatically stable with no evidence of heart failure. No changes to therapy which included an ACE inhibitor and beta blocker.  Dispo: f/u 6 months.   Prentice Docker, M.D., F.A.C.C.

## 2015-05-27 ENCOUNTER — Encounter: Payer: Self-pay | Admitting: Pharmacist

## 2015-05-27 ENCOUNTER — Ambulatory Visit (INDEPENDENT_AMBULATORY_CARE_PROVIDER_SITE_OTHER): Payer: Medicare Other | Admitting: Pharmacist

## 2015-05-27 VITALS — BP 122/70 | HR 74 | Ht 68.5 in | Wt 189.5 lb

## 2015-05-27 DIAGNOSIS — Z1211 Encounter for screening for malignant neoplasm of colon: Secondary | ICD-10-CM

## 2015-05-27 DIAGNOSIS — Z23 Encounter for immunization: Secondary | ICD-10-CM | POA: Diagnosis not present

## 2015-05-27 DIAGNOSIS — Z0000 Encounter for general adult medical examination without abnormal findings: Secondary | ICD-10-CM | POA: Diagnosis not present

## 2015-05-27 DIAGNOSIS — E8881 Metabolic syndrome: Secondary | ICD-10-CM

## 2015-05-27 LAB — POCT GLYCOSYLATED HEMOGLOBIN (HGB A1C): HEMOGLOBIN A1C: 5.6

## 2015-05-27 NOTE — Patient Instructions (Addendum)
  Adam Rowland , Thank you for taking time to come for your Medicare Wellness Visit. I appreciate your ongoing commitment to your health goals. Please review the following plan we discussed and let me know if I can assist you in the future.   These are the goals we discussed:   Increase non-starchy vegetables - carrots, green bean, squash, zucchini, tomatoes, onions, peppers, spinach and other green leafy vegetables, cabbage, lettuce, cucumbers, asparagus, okra (not fried), eggplant limit sugar and processed foods (cakes, cookies, ice cream, crackers and chips) Increase fresh fruit but limit serving sizes 1/2 cup or about the size of tennis or baseball limit red meat to no more than 1-2 times per week (serving size about the size of your palm) Choose whole grains / lean proteins - whole wheat bread, quinoa, whole grain rice (1/2 cup), fish, chicken, [place]    This is a list of the screening recommended for you and due dates:  Health Maintenance  Topic Date Due  . Flu Shot  06/20/2015*  .  Hepatitis C: One time screening is recommended by Center for Disease Control  (CDC) for  adults born from [birth year] through 1964.   11/18/2015*  . Colon Cancer Screening  11/30/2022  . Tetanus Vaccine  12/13/2022  . Shingles Vaccine  Completed  . Pneumonia vaccines  Completed  *Topic was postponed. The date shown is not the original due date.

## 2015-05-27 NOTE — Progress Notes (Signed)
Patient ID: Adam Rowland, male   DOB: 03/30/1946, 69 y.o.   MRN: [id number]     Subjective:   Adam Rowland is a 69 y.o. white, married male who presents for an Initial Medicare Annual Wellness Visit.  His main health concern is his ankle which he fractured about 1 year ago.  He has resume most of his normal activities but continues to have pain that comes and goes and some swelling.  He is seeing orthopedist - Dr Victorino Dike and next appt is in 2 months.   Patient reports that during surgery for his ankle fracture he has a MI.  He was suppose to do cardiac rehab but he lost insurance and never did.  He reports that he exercises on stationary bike for 30 minutes every day.  He wonders if PT either cardiac or for ankle would be appropriate now.     Current Medications (verified) Outpatient Encounter Prescriptions as of 05/27/2015  Medication Sig  . aspirin EC 81 MG EC tablet Take 1 tablet (81 mg total) by mouth daily.  Marland Kitchen atorvastatin (LIPITOR) 40 MG tablet Take 1 tablet (40 mg total) by mouth daily.  Marland Kitchen lisinopril (PRINIVIL,ZESTRIL) 10 MG tablet TAKE 1 TABLET DAILY  . metoprolol tartrate (LOPRESSOR) 25 MG tablet Take 1 tablet (25 mg total) by mouth 2 (two) times daily.  . nitroGLYCERIN (NITROSTAT) 0.4 MG SL tablet Place 1 tablet (0.4 mg total) under the tongue every 5 (five) minutes x 3 doses as needed for chest pain.  . prasugrel (EFFIENT) 10 MG TABS tablet Take 1 tablet (10 mg total) by mouth daily.   No facility-administered encounter medications on file as of 05/27/2015.    Allergies (verified) Review of patient's allergies indicates no known allergies.   History: Past Medical History  Diagnosis Date  . Hyperlipidemia   . Hypertension   . Cardiomyopathy, ischemic     a. 05/2014 Echo: Ef 45-50%, mid-apicalanteroseptal AK, Gr 2 DD.  Marland Kitchen CAD (coronary artery disease)     a. 05/2014 NSTEMI/PCI: LM nl, LAD 32m (3.0x23 Xience Alpine DES), D1 90, LCX 40m, OM1 80, RCA 30p/d, RPDA 40-50p, EF 40-45%  ->Plan for staged PCI of LCX/OM1.  . Nephrolithiasis 01/16/2013  . NSVT (nonsustained ventricular tachycardia)     a. 05/2014 in setting of NSTEMI -  asymptomatic.   Past Surgical History  Procedure Laterality Date  . Nasal septum surgery    . Ankle fracture surgery    . Cardiac catheterization    . Coronary stent placement    . Left heart catheterization with coronary angiogram N/A 06/20/2014    Procedure: LEFT HEART CATHETERIZATION WITH CORONARY ANGIOGRAM;  Surgeon: Peter M [place]aziland, MD;  Location: [facility] CATH LAB;  Service: Cardiovascular;  Laterality: N/A;  . Cardiac catheterization Right 06/20/2014    Procedure: CORONARY STENT INTERVENTION;  Surgeon: Peter M [place]azil[place], MD;  Location: [facility] CATH LAB;  Service: Cardiovascular;  Laterality: Right;  . Vasectomy     Family History  Problem Relation Age of Onset  . Colon cancer Neg Hx   . Stomach cancer Neg Hx   . Esophageal cancer Neg Hx   . Hypertension Mother   . Cancer Father     bladder ca  . Hyperlipidemia Father   . Alcohol abuse Father   . Hyperlipidemia Sister   . Cancer Brother   . Nephrolithiasis Brother    Social History   Occupational History  . Not on file.   Social History Main Topics  .  Smoking status: Former Smoker -- 1.00 packs/day for 23 years    Types: Cigarettes    Start date: 10/26/1964    Quit date: 08/30/1987  . Smokeless tobacco: Former Neurosurgeon    Types: Chew    Quit date: 02/27/1988     Comment: quit in 1989, smoked since he was a teenager/chewed tobacco for about 6 months after he quit smoking cigarettes  . Alcohol Use: 4.2 oz/week    7 Glasses of wine per week     Comment: 1 glass of wine nightly   . Drug Use: No  . Sexual Activity: Yes    Do you feel safe at home?  Yes  Dietary issues and exercise activities discussed: Current Exercise Habits:: Home exercise routine, Type of exercise: Other - see comments (stationary bike), Time (Minutes): 30, Frequency (Times/Week): 6, Weekly Exercise  (Minutes/Week): 180, Intensity: Intense  Current Dietary habits:  Patient limits high fat foods due to hyperlipidemia and CAD     Objective:    Today's Vitals   05/27/15 0953  BP: 122/70  Pulse: 74  Height: 5' 8.5" (1.74 m)  Weight: 189 lb 8 oz (85.957 kg)  PainSc: 2    Body mass index is 28.39 kg/(m^2).   A1c - 5.6% checked in office today FBG was 115 (05/21/2015)   Activities of Daily Living In your present state of health, do you have any difficulty performing the following activities: 05/27/2015 07/21/2014  Hearing? N N  Vision? N N  Difficulty concentrating or making decisions? N N  Walking or climbing stairs? N N  Dressing or bathing? N N  Doing errands, shopping? N N  Preparing Food and eating ? N -  Using the Toilet? N -  In the past six months, have you accidently leaked urine? N -  Do you have problems with loss of bowel control? N -  Managing your Medications? N -  Managing your Finances? N -  Housekeeping or managing your Housekeeping? N -    Are there smokers in your home (other than you)? No    Depression Screen PHQ 2/9 Scores 05/27/2015 05/21/2015 02/16/2015 10/23/2014  PHQ - 2 Score 0 0 0 0    Fall Risk Fall Risk  05/27/2015 05/21/2015 02/16/2015 10/23/2014 08/07/2014  Falls in the past year? No No No Yes Yes  Number falls in past yr: - - - 1 1  Injury with Fall? - - - Yes Yes    Cognitive Function: MMSE - Mini Mental State Exam 05/27/2015  Orientation to time 5  Orientation to Place 5  Registration 3  Attention/ Calculation 5  Recall 3  Language- name 2 objects 2  Language- follow 3 step command 3  Language- read & follow direction 1  Write a sentence 1  Copy design 1    Immunizations and Health Maintenance Immunization History  Administered Date(s) Administered  . Influenza Split 10/08/2012, 07/09/2013  . Influenza,inj,Quad PF,36+ Mos 06/21/2014, 05/27/2015  . Pneumococcal Conjugate-13 02/16/2015  . Pneumococcal Polysaccharide-23  12/12/2012  . Tdap 12/12/2012  . Zoster 10/08/2012   There are no preventive care reminders to display for this patient.  Patient Care Team: Bennie Pierini, FNP as PCP - General (Nurse Practitioner) Tora Duck, PA-C as Consulting Physician (Physician Assistant) Laqueta Linden, MD as Consulting Physician (Cardiology) Toni Arthurs, MD as Consulting Physician (Orthopedic Surgery)  Indicate any recent Medical Services you may have received from other than [facility] providers in the past year (date may be approximate).  Assessment:    Annual Wellness Visit  Elevated FBG / metabolic syndrome Overweight   Screening Tests Health Maintenance  Topic Date Due  . INFLUENZA VACCINE  06/20/2015 (Originally 03/30/2015)  . Hepatitis C Screening  11/18/2015 (Originally 1946-06-03)  . COLONOSCOPY  11/30/2022  . TETANUS/TDAP  12/13/2022  . ZOSTAVAX  Completed  . PNA vac Low Risk Adult  Completed        Plan:   During the course of the visit Adam Rowland was educated and counseled about the following appropriate screening and preventive services:   Vaccines to include Pneumoccal, Influenza, Hepatitis B, Td, Zostavax-influenza vaccine administered in office today  Colorectal cancer screening - colonoscopy UTD; gave FOBT in office today  Cardiovascular disease screening - UTD.  Lipid panel UTD - would like to see LDL less than 70 (in not when next checked I would recommend patient increase atorvastatin to ).  BP at goal  Diabetes screening - A1c is normal range.    Metabolic Syndrome - discussed limiting high CHO food and limiting serving sizes.  Increase fruits and non starchy vegetable intake.  Goal weight 180 lbs  Prostate cancer screening - UTD  Advanced Directives - UTD  Added hep C antibody added to labs drawn last week  Will check with Dr Victorino Dike about about doing PT for ankle.  Continue aerobic exercise on stationary bike.   Orders Placed This Encounter  Procedures  .  Flu Vaccine QUAD 36+ mos IM    Patient Instructions (the written plan) were given to the patient.   Adam Rowland, [facility]   05/27/2015

## 2015-05-27 NOTE — Addendum Note (Signed)
Addended by: Tommas Olp on: 05/27/2015 10:33 AM   Modules accepted: Orders

## 2015-05-28 ENCOUNTER — Telehealth: Payer: Self-pay | Admitting: Pharmacist

## 2015-05-28 NOTE — Telephone Encounter (Signed)
Patient has requested physical therapy referral for left ankle.  He had seen Dr Victorino Dike 2 weeks ago.   H/o fracture ankle with surgery to repair about 1 year ago.  I called Dr hewitt's office to see if this would be appropriate.  Left message regarding questions.

## 2015-05-29 LAB — SPECIMEN STATUS REPORT

## 2015-05-29 LAB — HCV COMMENT:

## 2015-05-29 LAB — HEPATITIS C ANTIBODY (REFLEX)

## 2015-05-29 NOTE — Telephone Encounter (Signed)
Received fax from Dr Victorino Dike office with order for Phycial therapy.  Left message for patient to call our office to let us know where he wants [place] to go for physical therapy.

## 2015-06-01 NOTE — Telephone Encounter (Signed)
Called patient back and had to leave message again - I have asked that he call our referral department to let then know where he would like to have PT set up.

## 2015-06-02 ENCOUNTER — Telehealth: Payer: Self-pay | Admitting: Pharmacist

## 2015-06-03 ENCOUNTER — Other Ambulatory Visit: Payer: Self-pay

## 2015-06-03 NOTE — Telephone Encounter (Signed)
Patient would like PT in [place] and would like to start next week if possible

## 2015-06-11 ENCOUNTER — Ambulatory Visit: Payer: Medicare Other | Attending: Orthopedic Surgery | Admitting: Physical Therapy

## 2015-06-11 DIAGNOSIS — R29898 Other symptoms and signs involving the musculoskeletal system: Secondary | ICD-10-CM

## 2015-06-11 DIAGNOSIS — M25672 Stiffness of left ankle, not elsewhere classified: Secondary | ICD-10-CM | POA: Diagnosis not present

## 2015-06-11 DIAGNOSIS — M25472 Effusion, left ankle: Secondary | ICD-10-CM

## 2015-06-11 DIAGNOSIS — R2681 Unsteadiness on feet: Secondary | ICD-10-CM | POA: Insufficient documentation

## 2015-06-11 DIAGNOSIS — M259 Joint disorder, unspecified: Secondary | ICD-10-CM | POA: Diagnosis present

## 2015-06-11 NOTE — Patient Instructions (Signed)
  Ankle Alphabet   Using left ankle and foot only, trace the letters of the alphabet. Perform A to Z. Repeat _1___ times per set. Do ____ sets per session. Do __2-3__ sessions per day.  http://orth.exer.us/16   Copyright  VHI. All rights reserved.    Ankle Circles   Slowly rotate right foot and ankle clockwise then counterclockwise. Gradually increase range of motion. Avoid pain. Circle __10__ times each direction per set. Do ____ sets per session. Do 2-3____ sessions per day.  http://orth.exer.us/30   Copyright  VHI. All rights reserved.    ROM: Plantar / Dorsiflexion   With left leg relaxed, gently flex and extend ankle. Move through full range of motion. Avoid pain. Repeat __20__ times per set. Do ____ sets per session. Do __3-4__ sessions per day.   Heel Raise: Bilateral (Standing)   Rise on balls of feet. Repeat _10___ times per set. Do __1-3__ sets per session. Do _2___ sessions per day.  Forefoot: Mobility   Stand with feet 6 inches apart. Try to flatten your arch on the inside like the motion above. Hold 10 seconds. Do 10-20 reps. 2 x per day.  Solon Palm, PT 06/11/2015 1:43 PM; [facility] [address] [place], [place], [address] Phone: [phone]   Fax:  [phone]

## 2015-06-11 NOTE — Therapy (Signed)
[facility] [address] [place], [place], [address] Phone: [phone]   Fax:  [phone]  Physical Therapy Evaluation  Patient Details  Name: Adam Rowland MRN: [id number] Date of Birth: [birth year] Referring Provider:  Toni Arthurs, MD  Encounter Date: 06/11/2015      PT End of Session - 06/11/15 1306    Visit Number 1   Number of Visits 12   Date for PT Re-Evaluation 07/09/15   PT Start Time 1307   PT Stop Time 1403   PT Time Calculation (min) 56 min   Activity Tolerance Patient tolerated treatment well   Behavior During Therapy [facility] for tasks assessed/performed      Past Medical History  Diagnosis Date  . Hyperlipidemia   . Hypertension   . Cardiomyopathy, ischemic     a. 05/2014 Echo: Ef 45-50%, mid-apicalanteroseptal AK, Gr 2 DD.  Marland Kitchen CAD (coronary artery disease)     a. 05/2014 NSTEMI/PCI: LM nl, LAD 28m (3.0x23 Xience Alpine DES), D1 90, LCX 68m, OM1 80, RCA 30p/d, RPDA 40-50p, EF 40-45% ->Plan for staged PCI of LCX/OM1.  . Nephrolithiasis 01/16/2013  . NSVT (nonsustained ventricular tachycardia)     a. 05/2014 in setting of NSTEMI -  asymptomatic.    Past Surgical History  Procedure Laterality Date  . Nasal septum surgery    . Ankle fracture surgery    . Cardiac catheterization    . Coronary stent placement    . Left heart catheterization with coronary angiogram N/A 06/20/2014    Procedure: LEFT HEART CATHETERIZATION WITH CORONARY ANGIOGRAM;  Surgeon: Peter M Swaziland, MD;  Location: [facility] CATH LAB;  Service: Cardiovascular;  Laterality: N/A;  . Cardiac catheterization Right 06/20/2014    Procedure: CORONARY STENT INTERVENTION;  Surgeon: Peter M Swaziland, MD;  Location: [facility] CATH LAB;  Service: Cardiovascular;  Laterality: Right;  . Vasectomy      There were no vitals filed for this visit.  Visit Diagnosis:  Ankle stiffness, left - Plan: PT plan of care cert/re-cert  Ankle weakness - Plan: PT plan of care  cert/re-cert  Unsteadiness - Plan: PT plan of care cert/re-cert  Left ankle swelling - Plan: PT plan of care cert/re-cert      Subjective Assessment - 06/11/15 1302    Subjective Patient fell from a lader and fractured his L fibula. He had surgery 06/12/14 to repair. Patient is still experiencing swelling in left ankle. He reports soreness only after being very active, but no pain.   Patient Stated Goals Get ankle back to normal   Currently in Pain? No/denies            [facility] PT Assessment - 06/11/15 0001    Assessment   Medical Diagnosis s/p L fib fx, L ankle arthritis   Onset Date/Surgical Date 06/12/14   Precautions   Precautions None   Restrictions   Weight Bearing Restrictions No   Balance Screen   Has the patient fallen in the past 6 months No   Has the patient had a decrease in activity level because of a fear of falling?  No   Is the patient reluctant to leave their home because of a fear of falling?  No   Home Environment   Additional Comments 15 stairs; uses reciprocal   Observation/Other Assessments   Focus on Therapeutic Outcomes (FOTO)  43% limited   Observation/Other Assessments-Edema    Edema Figure 8   Figure 8 Edema   Figure 8 - Right  53  cm   Figure 8 - Left  58 cm   Posture/Postural Control   Posture Comments stands on lateral border of feet L >R   ROM / Strength   AROM / PROM / Strength AROM;Strength   AROM   AROM Assessment Site Ankle   Right/Left Ankle Right;Left   Right Ankle Dorsiflexion 4   Right Ankle Plantar Flexion 50   Right Ankle Inversion 25   Left Ankle Dorsiflexion -7  -5 passive   Left Ankle Plantar Flexion 44  50 passive   Left Ankle Inversion 22   Left Ankle Eversion 30   Strength   Strength Assessment Site Ankle   Right/Left Ankle Right   Right Ankle Dorsiflexion 5/5   Right Ankle Inversion 4/5   Right Ankle Eversion 5/5   Flexibility   Soft Tissue Assessment /Muscle Length --  marked tightness of B HS   Palpation    Palpation comment unremarkable   Ambulation/Gait   Gait Comments ambulates on lateral borders of feet   High Level Balance   High Level Balance Activites Other (comment)   High Level Balance Comments SLS R 15 sec; L 7 sec and very unsteady                   [facility] Adult PT Treatment/Exercise - 06/11/15 0001    Modalities   Modalities Vasopneumatic   Vasopneumatic   Number Minutes Vasopneumatic  15 minutes   Vasopnuematic Location  Ankle   Vasopneumatic Pressure Medium   Vasopneumatic Temperature  34                PT Education - 06/11/15 1555    Education provided Yes   Education Details HEP   Person(s) Educated Patient   Methods Explanation;Demonstration;Handout   Comprehension Verbalized understanding;Returned demonstration          PT Short Term Goals - 06/11/15 1603    PT SHORT TERM GOAL #1   Title I with initial HEP   Time 2   Period Weeks   Status New           PT Long Term Goals - 06/11/15 1603    PT LONG TERM GOAL #1   Title I with HEP   Time 4   Period Weeks   Status New   PT LONG TERM GOAL #2   Title improved L ankle ROM 5 to 50 degrees to improve ambulation   Time 4   Period Weeks   Status New   PT LONG TERM GOAL #3   Title demo 4+/5 L ankle strength   Time 4   Period Weeks   Status New   PT LONG TERM GOAL #4   Title decreased edema to within 2.5 cm of R (figure 8)   Time 4   Period Weeks   Status New   PT LONG TERM GOAL #5   Title improved SLS on L to 15 seconds or greater   Time 4   Period Weeks   Status New               Plan - 06/11/15 1555    Clinical Impression Statement Patient is a 69 year old male who had surgery 06/12/14 to repair his L fibula which he fractured in a fall. He has significant ROM deficits and edema in the L ankle affecting ADLs. He also has balance deficits on the LLE.    Pt will benefit from skilled therapeutic intervention in order to improve  on the following deficits Abnormal  gait;Decreased range of motion;Impaired flexibility;Decreased balance;Decreased strength;Increased edema   Rehab Potential Excellent   PT Frequency 2x / week   PT Duration 4 weeks   PT Treatment/Interventions ADLs/Self Care Home Management;Electrical Stimulation;Cryotherapy;Therapeutic exercise;Balance training;Neuromuscular re-education;Manual techniques;Vasopneumatic Device;Passive range of motion;Taping   PT Next Visit Plan Review gastroc stretches, standing foot pronation, rockerboard, ankle PF/DF/Inv strengthening, balance activities, KT and vaso for edema prn   PT Home Exercise Plan ankle rom, standing foot pronation and gastroc stretch, HS stretch   Consulted and Agree with Plan of Care Patient          G-Codes - 06/18/15 1610    Functional Assessment Tool Used FOTO 43% LIMITED   Functional Limitation Mobility: Walking and moving around   Mobility: Walking and Moving Around Current Status [phone]) At least 40 percent but less than 60 percent impaired, limited or restricted   Mobility: Walking and Moving Around Goal Status [phone]) At least 20 percent but less than 40 percent impaired, limited or restricted       Problem List Patient Active Problem List   Diagnosis Date Noted  . Metabolic syndrome 05/27/2015  . BMI 27.0-27.9,adult 05/21/2015  . CAD (coronary artery disease) 06/21/2014  . NSVT (nonsustained ventricular tachycardia) (HCC) 06/21/2014  . Cardiomyopathy, ischemic 06/21/2014  . NSTEMI (non-ST elevated myocardial infarction) (HCC) 06/19/2014  . Hypertension 01/16/2014  . Hyperlipidemia 01/16/2013  . Nephrolithiasis 01/16/2013   Solon Palm PT  Jun 18, 2015, 4:11 PM  [facility] [address] [place], [place], [address] Phone: [phone]   Fax:  [phone]

## 2015-06-16 ENCOUNTER — Encounter: Payer: Self-pay | Admitting: *Deleted

## 2015-06-16 ENCOUNTER — Ambulatory Visit: Payer: Medicare Other | Admitting: *Deleted

## 2015-06-16 DIAGNOSIS — M25472 Effusion, left ankle: Secondary | ICD-10-CM

## 2015-06-16 DIAGNOSIS — M25672 Stiffness of left ankle, not elsewhere classified: Secondary | ICD-10-CM | POA: Diagnosis not present

## 2015-06-16 DIAGNOSIS — R2681 Unsteadiness on feet: Secondary | ICD-10-CM

## 2015-06-16 DIAGNOSIS — R29898 Other symptoms and signs involving the musculoskeletal system: Secondary | ICD-10-CM

## 2015-06-16 NOTE — Therapy (Signed)
[facility] [address] [place], [place], [address] Phone: [phone]   Fax:  [phone]  Physical Therapy Treatment  Patient Details  Name: Adam Rowland MRN: [id number] Date of Birth: 12/25/1945 No Data Recorded  Encounter Date: 06/16/2015      PT End of Session - 06/16/15 1523    Visit Number 2   Number of Visits 12   Date for PT Re-Evaluation 07/09/15   PT Start Time 1345   PT Stop Time 1444   PT Time Calculation (min) 59 min      Past Medical History  Diagnosis Date  . Hyperlipidemia   . Hypertension   . Cardiomyopathy, ischemic     a. 05/2014 Echo: Ef 45-50%, mid-apicalanteroseptal AK, Gr 2 DD.  Marland Kitchen CAD (coronary artery disease)     a. 05/2014 NSTEMI/PCI: LM nl, LAD 37m (3.0x23 Xience Alpine DES), D1 90, LCX 35m, OM1 80, RCA 30p/d, RPDA 40-50p, EF 40-45% ->Plan for staged PCI of LCX/OM1.  . Nephrolithiasis 01/16/2013  . NSVT (nonsustained ventricular tachycardia) (HCC)     a. 05/2014 in setting of NSTEMI -  asymptomatic.    Past Surgical History  Procedure Laterality Date  . Nasal septum surgery    . Ankle fracture surgery    . Cardiac catheterization    . Coronary stent placement    . Left heart catheterization with coronary angiogram N/A 06/20/2014    Procedure: LEFT HEART CATHETERIZATION WITH CORONARY ANGIOGRAM;  Surgeon: Peter M Swaziland, MD;  Location: [facility] CATH LAB;  Service: Cardiovascular;  Laterality: N/A;  . Cardiac catheterization Right 06/20/2014    Procedure: CORONARY STENT INTERVENTION;  Surgeon: Peter M Swaziland, MD;  Location: [facility] CATH LAB;  Service: Cardiovascular;  Laterality: Right;  . Vasectomy      There were no vitals filed for this visit.  Visit Diagnosis:  Ankle stiffness, left  Ankle weakness  Unsteadiness  Left ankle swelling                       [facility] Adult PT Treatment/Exercise - 06/16/15 0001    Exercises   Exercises Ankle   Modalities   Modalities Vasopneumatic    Vasopneumatic   Number Minutes Vasopneumatic  15 minutes   Vasopnuematic Location  Ankle   Vasopneumatic Pressure Medium   Vasopneumatic Temperature  34   Manual Therapy   Manual Therapy Soft tissue mobilization;Myofascial release   Myofascial Release IASTM to LT ankle and lowar leg   Ankle Exercises: Aerobic   Stationary Bike Nustep l4 x 12 mins   Ankle Exercises: Standing   Rocker Board 3 minutes;5 minutes  DF/PF and bal both, Inv/evers.just LT Foot 20%   Other Standing Ankle Exercises Dyna disc all motions LT  20% WB                  PT Short Term Goals - 06/11/15 1603    PT SHORT TERM GOAL #1   Title I with initial HEP   Time 2   Period Weeks   Status New           PT Long Term Goals - 06/11/15 1603    PT LONG TERM GOAL #1   Title I with HEP   Time 4   Period Weeks   Status New   PT LONG TERM GOAL #2   Title improved L ankle ROM 5 to 50 degrees to improve ambulation   Time 4   Period Weeks  Status New   PT LONG TERM GOAL #3   Title demo 4+/5 L ankle strength   Time 4   Period Weeks   Status New   PT LONG TERM GOAL #4   Title decreased edema to within 2.5 cm of R (figure 8)   Time 4   Period Weeks   Status New   PT LONG TERM GOAL #5   Title improved SLS on L to 15 seconds or greater   Time 4   Period Weeks   Status New               Plan - 06/16/15 1528    Clinical Impression Statement Pt did great with therex today and was able to complete Rom and proprioception exs without complaints of increased pain.   Pt will benefit from skilled therapeutic intervention in order to improve on the following deficits Abnormal gait;Decreased range of motion;Impaired flexibility;Decreased balance;Decreased strength;Increased edema   Rehab Potential Excellent   PT Frequency 2x / week   PT Duration 4 weeks   PT Treatment/Interventions ADLs/Self Care Home Management;Electrical Stimulation;Cryotherapy;Therapeutic exercise;Balance  training;Neuromuscular re-education;Manual techniques;Vasopneumatic Device;Passive range of motion;Taping   PT Next Visit Plan Review gastroc stretches, standing foot pronation, rockerboard, ankle PF/DF/Inv strengthening, balance activities, KT and vaso for edema prn   PT Home Exercise Plan ankle rom, standing foot pronation and gastroc stretch, HS stretch   Consulted and Agree with Plan of Care Patient        Problem List Patient Active Problem List   Diagnosis Date Noted  . Metabolic syndrome 05/27/2015  . BMI 27.0-27.9,adult 05/21/2015  . CAD (coronary artery disease) 06/21/2014  . NSVT (nonsustained ventricular tachycardia) (HCC) 06/21/2014  . Cardiomyopathy, ischemic 06/21/2014  . NSTEMI (non-ST elevated myocardial infarction) (HCC) 06/19/2014  . Hypertension 01/16/2014  . Hyperlipidemia 01/16/2013  . Nephrolithiasis 01/16/2013    RAMSEUR,CHRIS, PTA 06/16/2015, 3:44 PM  [facility] [address] [place], [place], [address] Phone: [phone]   Fax:  [phone]  Name: CLENT DAMORE MRN: [id number] Date of Birth: 1946-07-31

## 2015-06-19 ENCOUNTER — Ambulatory Visit: Payer: Medicare Other | Admitting: *Deleted

## 2015-06-24 ENCOUNTER — Encounter: Payer: Self-pay | Admitting: Physical Therapy

## 2015-06-24 ENCOUNTER — Ambulatory Visit: Payer: Medicare Other | Admitting: Physical Therapy

## 2015-06-24 DIAGNOSIS — M25672 Stiffness of left ankle, not elsewhere classified: Secondary | ICD-10-CM | POA: Diagnosis not present

## 2015-06-24 DIAGNOSIS — R29898 Other symptoms and signs involving the musculoskeletal system: Secondary | ICD-10-CM

## 2015-06-24 DIAGNOSIS — M25472 Effusion, left ankle: Secondary | ICD-10-CM

## 2015-06-24 DIAGNOSIS — R2681 Unsteadiness on feet: Secondary | ICD-10-CM

## 2015-06-24 NOTE — Therapy (Signed)
[facility] [address] [place], [place], [address] Phone: [phone]   Fax:  [phone]  Physical Therapy Treatment  Patient Details  Name: Adam Rowland MRN: [id number] Date of Birth: Dec 09, 1945 Referring Provider: Dr. Thurston Hole  Encounter Date: 06/24/2015      PT End of Session - 06/24/15 0739    Visit Number 3   Number of Visits 12   Date for PT Re-Evaluation 07/09/15   PT Start Time 0733   PT Stop Time 0818   PT Time Calculation (min) 45 min   Activity Tolerance Patient tolerated treatment well   Behavior During Therapy [facility] for tasks assessed/performed      Past Medical History  Diagnosis Date  . Hyperlipidemia   . Hypertension   . Cardiomyopathy, ischemic     a. 05/2014 Echo: Ef 45-50%, mid-apicalanteroseptal AK, Gr 2 DD.  Marland Kitchen CAD (coronary artery disease)     a. 05/2014 NSTEMI/PCI: LM nl, LAD 64m (3.0x23 Xience Alpine DES), D1 90, LCX 33m, OM1 80, RCA 30p/d, RPDA 40-50p, EF 40-45% ->Plan for staged PCI of LCX/OM1.  . Nephrolithiasis 01/16/2013  . NSVT (nonsustained ventricular tachycardia) (HCC)     a. 05/2014 in setting of NSTEMI -  asymptomatic.    Past Surgical History  Procedure Laterality Date  . Nasal septum surgery    . Ankle fracture surgery    . Cardiac catheterization    . Coronary stent placement    . Left heart catheterization with coronary angiogram N/A 06/20/2014    Procedure: LEFT HEART CATHETERIZATION WITH CORONARY ANGIOGRAM;  Surgeon: Peter M Swaziland, MD;  [facility] LAB;  Service: Cardiovascular;  Laterality: N/A;  . Cardiac catheterization Right 06/20/2014    Procedure: CORONARY STENT INTERVENTION;  Surgeon: Peter M Swaziland, MD;  Location: [facility] CATH LAB;  Service: Cardiovascular;  Laterality: Right;  . Vasectomy      There were no vitals filed for this visit.  Visit Diagnosis:  Ankle stiffness, left  Ankle weakness  Unsteadiness  Left ankle swelling      Subjective Assessment -  06/24/15 0738    Subjective Reports no pain, no concerns at home or any problems.   Patient Stated Goals Get ankle back to normal   Currently in Pain? No/denies            [facility] PT Assessment - 06/24/15 0001    Assessment   Medical Diagnosis s/p L fib fx, L ankle arthritis   Referring Provider Dr. Thurston Hole   Onset Date/Surgical Date 06/12/14   Next MD Visit 06/2015   Precautions   Precautions None                     [facility] Adult PT Treatment/Exercise - 06/24/15 0001    Modalities   Modalities Vasopneumatic   Vasopneumatic   Number Minutes Vasopneumatic  15 minutes   Vasopnuematic Location  Ankle   Vasopneumatic Pressure Medium   Vasopneumatic Temperature  34   Ankle Exercises: Aerobic   Stationary Bike NuStep L5 x12 min   Ankle Exercises: Standing   Rocker Board 3 minutes  PF/DF x3 min, Inv/Ev x3 min   Other Standing Ankle Exercises Dyna disc all motions LT  20% WB x3 min   Ankle Exercises: Seated   ABC's 1 rep   Heel Raises 20 reps   Toe Raise 20 reps   Other Seated Ankle Exercises L ankle triange, circle, square x10 reps each  PT Short Term Goals - 06/24/15 0747    PT SHORT TERM GOAL #1   Title I with initial HEP   Time 2   Period Weeks   Status Achieved           PT Long Term Goals - 06/24/15 0747    PT LONG TERM GOAL #1   Title I with HEP   Time 4   Period Weeks   Status On-going   PT LONG TERM GOAL #2   Title improved L ankle ROM 5 to 50 degrees to improve ambulation   Time 4   Period Weeks   Status On-going   PT LONG TERM GOAL #3   Title demo 4+/5 L ankle strength   Time 4   Period Weeks   Status On-going   PT LONG TERM GOAL #4   Title decreased edema to within 2.5 cm of R (figure 8)   Time 4   Period Weeks   Status On-going   PT LONG TERM GOAL #5   Title improved SLS on L to 15 seconds or greater   Time 4   Period Weeks   Status On-going               Plan - 06/24/15 0747    Clinical  Impression Statement Patient tolerated today's treatment well without complaint of increased pain. Presented in clinic with tennis shoes on B feet and no assistive device. Required verbal cueing during seated ABCs to utilize L ankle only and not to use L knee for the motions. Completed all exericses without complaint of pain. Normal vasopnuematic response noted following removal of the modalities. Denied L ankle pain following today's treatment.   Pt will benefit from skilled therapeutic intervention in order to improve on the following deficits Abnormal gait;Decreased range of motion;Impaired flexibility;Decreased balance;Decreased strength;Increased edema   Rehab Potential Excellent   PT Frequency 2x / week   PT Duration 4 weeks   PT Treatment/Interventions ADLs/Self Care Home Management;Electrical Stimulation;Cryotherapy;Therapeutic exercise;Balance training;Neuromuscular re-education;Manual techniques;Vasopneumatic Device;Passive range of motion;Taping   PT Next Visit Plan Review gastroc stretches, standing foot pronation, rockerboard, ankle PF/DF/Inv strengthening, balance activities, KT and vaso for edema prn   Consulted and Agree with Plan of Care Patient        Problem List Patient Active Problem List   Diagnosis Date Noted  . Metabolic syndrome 05/27/2015  . BMI 27.0-27.9,adult 05/21/2015  . CAD (coronary artery disease) 06/21/2014  . NSVT (nonsustained ventricular tachycardia) (HCC) 06/21/2014  . Cardiomyopathy, ischemic 06/21/2014  . NSTEMI (non-ST elevated myocardial infarction) (HCC) 06/19/2014  . Hypertension 01/16/2014  . Hyperlipidemia 01/16/2013  . Nephrolithiasis 01/16/2013    Evelene Croon, PTA 06/24/2015, 8:22 AM  [facility] [address] [place], [place], [address] Phone: [phone]   Fax:  [phone]  Name: MASIAH BROKER MRN: [id number] Date of Birth: [birth year]

## 2015-06-26 ENCOUNTER — Ambulatory Visit: Payer: Medicare Other | Admitting: *Deleted

## 2015-06-26 ENCOUNTER — Encounter: Payer: Self-pay | Admitting: *Deleted

## 2015-06-26 DIAGNOSIS — M25672 Stiffness of left ankle, not elsewhere classified: Secondary | ICD-10-CM

## 2015-06-26 DIAGNOSIS — R29898 Other symptoms and signs involving the musculoskeletal system: Secondary | ICD-10-CM

## 2015-06-26 DIAGNOSIS — R2681 Unsteadiness on feet: Secondary | ICD-10-CM

## 2015-06-26 DIAGNOSIS — M25472 Effusion, left ankle: Secondary | ICD-10-CM

## 2015-06-26 NOTE — Therapy (Addendum)
[facility] [place], [place], [address] Phone: [phone]   Fax:  [phone]  Physical Therapy Treatment  Patient Details  Name: Adam Rowland MRN: [id number] Date of Birth: [birth year] No Data Recorded  Encounter Date: 06/26/2015    Past Medical History:  Diagnosis Date  . CAD (coronary artery disease)    a. 05/2014 NSTEMI/PCI: LM nl, LAD 107m(3.0x23 Xience Alpine DES), D1 90, LCX 863mOM1 80, RCA 30p/d, RPDA 40-50p, EF 40-45% ->Plan for staged PCI of LCX/OM1.  . Cardiomyopathy, ischemic    a. 05/2014 Echo: Ef 45-50%, mid-apicalanteroseptal AK, Gr 2 DD.  . Marland Kitchenataract   . Fracture, ankle 06/12/2014  . Hyperlipidemia   . Hypertension   . Myocardial infarction 2015   non STEMI  . Nephrolithiasis 01/16/2013  . NSVT (nonsustained ventricular tachycardia) ([place]   a. 05/2014 in setting of NSTEMI -  asymptomatic.    Past Surgical History:  Procedure Laterality Date  . ANKLE FRACTURE SURGERY    . CARDIAC CATHETERIZATION    . CARDIAC CATHETERIZATION Right 06/20/2014   Procedure: CORONARY STENT INTERVENTION;  Surgeon: Peter M JoMartiniqueMD;  [facility];  Service: Cardiovascular;  Laterality: Right;  . CATARACT EXTRACTION Bilateral   . CORONARY STENT PLACEMENT    . LEFT HEART CATHETERIZATION WITH CORONARY ANGIOGRAM N/A 06/20/2014   Procedure: LEFT HEART CATHETERIZATION WITH CORONARY ANGIOGRAM;  Surgeon: Peter M JoMartiniqueMD;  [facility];  Service: Cardiovascular;  Laterality: N/A;  . NASAL SEPTUM SURGERY    . VASECTOMY      There were no vitals filed for this visit.  Visit Diagnosis:  Ankle stiffness, left  Ankle weakness  Unsteadiness  Left ankle swelling                                 PT Short Term Goals - 06/26/15 098546    PT SHORT TERM GOAL #1   Title I with initial HEP   Time 2   Period Weeks   Status Achieved           PT Long Term Goals - 06/26/15  [id number]    PT LONG TERM GOAL #1   Title I with HEP   Time 4   Period Weeks   Status Achieved     PT LONG TERM GOAL #2   Title improved L ankle ROM 5 to 50 degrees to improve ambulation   Time 4   Period Weeks   Status Not Met  5 degrees DF met . PF NM ONLY 35 degrees     PT LONG TERM GOAL #3   Title demo 4+/5 L ankle strength   Time 4   Period Weeks   Status Achieved     PT LONG TERM GOAL #4   Title decreased edema to within 2.5 cm of R (figure 8)  NM due to 3.5 cm > RT   Time 4   Status Not Met     PT LONG TERM GOAL #5   Title improved SLS on L to 15 seconds or greater   Time 4   Period Weeks   Status Achieved               Problem List Patient Active Problem List   Diagnosis Date Noted  . Metabolic syndrome 0950/04/3817. BMI 27.0-27.9,adult 05/21/2015  . CAD (coronary artery disease)  06/21/2014  . NSVT (nonsustained ventricular tachycardia) ([place]) 06/21/2014  . Cardiomyopathy, ischemic 06/21/2014  . NSTEMI (non-ST elevated myocardial infarction) ([place]) 06/19/2014  . Hypertension 01/16/2014  . Hyperlipidemia 01/16/2013  . Nephrolithiasis 01/16/2013    APPLEGATE, [place] 07/04/2016, 3:12 PM  [facility] [address] [place], [place], [address] Phone: [phone]   Fax:  [phone]  Name: Adam Rowland MRN: [id number] Date of Birth: 09-22-1945  PHYSICAL THERAPY DISCHARGE SUMMARY  Visits from Start of Care:  4.  Current functional level related to goals / functional outcomes: Please see above.   Remaining deficits: Continued loss of left ankle ROM and edema.   Education / Equipment: HEP. Plan: Patient agrees to discharge.  Patient goals were partially met. Patient is being discharged due to being pleased with the current functional level.  ?????          Mali Applegate MPT

## 2015-08-19 ENCOUNTER — Other Ambulatory Visit: Payer: Self-pay | Admitting: Nurse Practitioner

## 2015-10-01 ENCOUNTER — Other Ambulatory Visit: Payer: Self-pay | Admitting: Cardiovascular Disease

## 2015-10-05 ENCOUNTER — Telehealth: Payer: Self-pay | Admitting: *Deleted

## 2015-10-05 NOTE — Telephone Encounter (Signed)
An FOBT was ordered in 04/2015 but not resulted. Called patient to see if he could complete that and bring it in or if he would like me to mail him another one. Detailed message left on home phone.

## 2015-10-13 DIAGNOSIS — H43813 Vitreous degeneration, bilateral: Secondary | ICD-10-CM | POA: Diagnosis not present

## 2015-10-13 DIAGNOSIS — H2513 Age-related nuclear cataract, bilateral: Secondary | ICD-10-CM | POA: Diagnosis not present

## 2015-10-13 DIAGNOSIS — G514 Facial myokymia: Secondary | ICD-10-CM | POA: Diagnosis not present

## 2015-10-13 DIAGNOSIS — H35371 Puckering of macula, right eye: Secondary | ICD-10-CM | POA: Diagnosis not present

## 2015-10-19 ENCOUNTER — Other Ambulatory Visit: Payer: Medicare Other

## 2015-10-19 DIAGNOSIS — Z1212 Encounter for screening for malignant neoplasm of rectum: Secondary | ICD-10-CM | POA: Diagnosis not present

## 2015-10-19 NOTE — Progress Notes (Signed)
LAB ONLY 

## 2015-10-22 LAB — FECAL OCCULT BLOOD, IMMUNOCHEMICAL: Fecal Occult Bld: NEGATIVE

## 2015-11-17 ENCOUNTER — Other Ambulatory Visit: Payer: Self-pay | Admitting: Nurse Practitioner

## 2015-11-17 ENCOUNTER — Encounter: Payer: Self-pay | Admitting: Cardiovascular Disease

## 2015-11-17 ENCOUNTER — Ambulatory Visit (INDEPENDENT_AMBULATORY_CARE_PROVIDER_SITE_OTHER): Payer: Medicare Other | Admitting: Cardiovascular Disease

## 2015-11-17 VITALS — BP 148/82 | HR 61 | Ht 69.0 in | Wt 193.0 lb

## 2015-11-17 DIAGNOSIS — I10 Essential (primary) hypertension: Secondary | ICD-10-CM

## 2015-11-17 DIAGNOSIS — E785 Hyperlipidemia, unspecified: Secondary | ICD-10-CM

## 2015-11-17 DIAGNOSIS — I2510 Atherosclerotic heart disease of native coronary artery without angina pectoris: Secondary | ICD-10-CM

## 2015-11-17 DIAGNOSIS — I519 Heart disease, unspecified: Secondary | ICD-10-CM

## 2015-11-17 DIAGNOSIS — I252 Old myocardial infarction: Secondary | ICD-10-CM | POA: Diagnosis not present

## 2015-11-17 DIAGNOSIS — Z955 Presence of coronary angioplasty implant and graft: Secondary | ICD-10-CM

## 2015-11-17 DIAGNOSIS — I255 Ischemic cardiomyopathy: Secondary | ICD-10-CM

## 2015-11-17 NOTE — Progress Notes (Signed)
Patient ID: OSRIC KLOPF, male   DOB: [birth year], 70 y.o.   MRN: [id number]      SUBJECTIVE: Mr. Mihalik returns for routine cardiovascular follow-up. He has a history of a non-STEMI in 05/2014 and underwent coronary artery stent placement to the LAD. He has significant residual disease of the left circumflex and first obtuse marginal, and it was felt that a staged approach to additional intervention would be appropriate once he fully recovered from his myocardial infarction. He has an ischemic cardiomyopathy, EF 45-50%. He also has hypertension and hyperlipidemia.  He denies chest pain, palpitations, leg swelling, and shortness of breath.   Continues to exercise on recumbent bicycle 15-20 minutes each morning and evening without limitations.   Review of Systems: As per "subjective", otherwise negative.  No Known Allergies  Current Outpatient Prescriptions  Medication Sig Dispense Refill  . aspirin EC 81 MG EC tablet Take 1 tablet (81 mg total) by mouth daily.    Marland Kitchen atorvastatin (LIPITOR) 40 MG tablet Take 1 tablet (40 mg total) by mouth daily. 90 tablet 1  . EFFIENT 10 MG TABS tablet TAKE 1 TABLET (10 MG TOTAL) BY MOUTH DAILY. 90 tablet 0  . lisinopril (PRINIVIL,ZESTRIL) 10 MG tablet TAKE 1 TABLET DAILY 90 tablet 0  . metoprolol tartrate (LOPRESSOR) 25 MG tablet Take 1 tablet (25 mg total) by mouth 2 (two) times daily. 180 tablet 1  . nitroGLYCERIN (NITROSTAT) 0.4 MG SL tablet Place 1 tablet (0.4 mg total) under the tongue every 5 (five) minutes x 3 doses as needed for chest pain. 25 tablet 3   No current facility-administered medications for this visit.    Past Medical History  Diagnosis Date  . Hyperlipidemia   . Hypertension   . Cardiomyopathy, ischemic     a. 05/2014 Echo: Ef 45-50%, mid-apicalanteroseptal AK, Gr 2 DD.  Marland Kitchen CAD (coronary artery disease)     a. 05/2014 NSTEMI/PCI: LM nl, LAD 96m (3.0x23 Xience Alpine DES), D1 90, LCX 74m, OM1 80, RCA 30p/d, RPDA 40-50p, EF 40-45%  ->Plan for staged PCI of LCX/OM1.  . Nephrolithiasis 01/16/2013  . NSVT (nonsustained ventricular tachycardia) (HCC)     a. 05/2014 in setting of NSTEMI -  asymptomatic.    Past Surgical History  Procedure Laterality Date  . Nasal septum surgery    . Ankle fracture surgery    . Cardiac catheterization    . Coronary stent placement    . Left heart catheterization with coronary angiogram N/A 06/20/2014    Procedure: LEFT HEART CATHETERIZATION WITH CORONARY ANGIOGRAM;  Surgeon: Peter M Swaziland, MD;  Location: [facility] CATH LAB;  Service: Cardiovascular;  Laterality: N/A;  . Cardiac catheterization Right 06/20/2014    Procedure: CORONARY STENT INTERVENTION;  Surgeon: Peter M Swaziland, MD;  Location: [facility] CATH LAB;  Service: Cardiovascular;  Laterality: Right;  . Vasectomy      Social History   Social History  . Marital Status: Married    Spouse Name: N/A  . Number of Children: N/A  . Years of Education: N/A   Occupational History  . Not on file.   Social History Main Topics  . Smoking status: Former Smoker -- 1.00 packs/day for 23 years    Types: Cigarettes    Start date: 10/26/1964    Quit date: 08/30/1987  . Smokeless tobacco: Former Neurosurgeon    Types: Chew    Quit date: 02/27/1988     Comment: quit in 1989, smoked since he was a teenager/chewed tobacco for about 6  months after he quit smoking cigarettes  . Alcohol Use: 4.2 oz/week    7 Glasses of wine per week     Comment: 1 glass of wine nightly   . Drug Use: No  . Sexual Activity: Yes   Other Topics Concern  . Not on file   Social History Narrative     Filed Vitals:   11/17/15 0818  BP: 148/82  Pulse: 61  Height: 5\' 9"  (1.753 m)  Weight: 193 lb (87.544 kg)  SpO2: 96%    PHYSICAL EXAM General: NAD HEENT: Normal. Neck: No JVD, no thyromegaly. Lungs: Clear to auscultation bilaterally with normal respiratory effort. CV: Nondisplaced PMI.  Regular rate and rhythm, normal S1/S2, no S3/S4, no murmur. No pretibial or  periankle edema.  No carotid bruit.   Abdomen: Soft, nontender, no distention.  Neurologic: Alert and oriented.  Psych: Normal affect. Skin: Normal. Musculoskeletal: No gross deformities.  ECG: Most recent ECG reviewed.      ASSESSMENT AND PLAN: 1. CAD s/p LAD stent for NSTEMI: Stable ischemic heart disease. Continue aspirin, Lipitor, lisinopril, metoprolol, and Effient. He may eventually require a staged intervention of the left circumflex coronary artery and first obtuse marginal branch if optimal medical therapy fails.  2. Essential HTN: Mildly elevated today, but well controlled at last visit. Will monitor. No changes today.  3. Hyperlipidemia: Lipids checked on 05/21/15 with LDL 85. Encouraged dietary modification. Continue Lipitor 40 mg daily.  4. Ischemic cardiomyopathy: Symptomatically stable with no evidence of heart failure. No changes to therapy which included an ACE inhibitor and beta blocker.  Dispo: f/u 6 months.   Prentice Docker, M.D., F.A.C.C.

## 2015-11-17 NOTE — Patient Instructions (Signed)
Continue all current medications. Your physician wants you to follow up in: 6 months.  You will receive a reminder letter in the mail one-two months in advance.  If you don't receive a letter, please call our office to schedule the follow up appointment   

## 2015-12-02 DIAGNOSIS — H35371 Puckering of macula, right eye: Secondary | ICD-10-CM | POA: Diagnosis not present

## 2015-12-02 DIAGNOSIS — H33311 Horseshoe tear of retina without detachment, right eye: Secondary | ICD-10-CM | POA: Diagnosis not present

## 2015-12-02 DIAGNOSIS — H33011 Retinal detachment with single break, right eye: Secondary | ICD-10-CM | POA: Diagnosis not present

## 2015-12-02 DIAGNOSIS — H43813 Vitreous degeneration, bilateral: Secondary | ICD-10-CM | POA: Diagnosis not present

## 2015-12-08 DIAGNOSIS — H2512 Age-related nuclear cataract, left eye: Secondary | ICD-10-CM | POA: Diagnosis not present

## 2015-12-08 DIAGNOSIS — H35371 Puckering of macula, right eye: Secondary | ICD-10-CM | POA: Diagnosis not present

## 2015-12-08 DIAGNOSIS — H18412 Arcus senilis, left eye: Secondary | ICD-10-CM | POA: Diagnosis not present

## 2015-12-08 DIAGNOSIS — H2511 Age-related nuclear cataract, right eye: Secondary | ICD-10-CM | POA: Diagnosis not present

## 2015-12-08 DIAGNOSIS — H02839 Dermatochalasis of unspecified eye, unspecified eyelid: Secondary | ICD-10-CM | POA: Diagnosis not present

## 2015-12-15 ENCOUNTER — Other Ambulatory Visit: Payer: Self-pay | Admitting: Nurse Practitioner

## 2015-12-15 NOTE — Telephone Encounter (Signed)
Last seen 05/21/15  MMM

## 2015-12-18 DIAGNOSIS — H33011 Retinal detachment with single break, right eye: Secondary | ICD-10-CM | POA: Diagnosis not present

## 2015-12-18 DIAGNOSIS — H35371 Puckering of macula, right eye: Secondary | ICD-10-CM | POA: Diagnosis not present

## 2015-12-21 DIAGNOSIS — H25811 Combined forms of age-related cataract, right eye: Secondary | ICD-10-CM | POA: Diagnosis not present

## 2015-12-21 DIAGNOSIS — H2511 Age-related nuclear cataract, right eye: Secondary | ICD-10-CM | POA: Diagnosis not present

## 2015-12-22 DIAGNOSIS — H2512 Age-related nuclear cataract, left eye: Secondary | ICD-10-CM | POA: Diagnosis not present

## 2016-01-04 ENCOUNTER — Other Ambulatory Visit: Payer: Self-pay | Admitting: Cardiovascular Disease

## 2016-01-04 DIAGNOSIS — Z961 Presence of intraocular lens: Secondary | ICD-10-CM | POA: Diagnosis not present

## 2016-01-04 DIAGNOSIS — H25812 Combined forms of age-related cataract, left eye: Secondary | ICD-10-CM | POA: Diagnosis not present

## 2016-01-04 DIAGNOSIS — H2512 Age-related nuclear cataract, left eye: Secondary | ICD-10-CM | POA: Diagnosis not present

## 2016-01-12 ENCOUNTER — Telehealth: Payer: Self-pay | Admitting: *Deleted

## 2016-01-12 DIAGNOSIS — H35371 Puckering of macula, right eye: Secondary | ICD-10-CM | POA: Diagnosis not present

## 2016-01-12 NOTE — Telephone Encounter (Signed)
Faxed to Dr Zella Ball office  [phone]

## 2016-01-12 NOTE — Telephone Encounter (Signed)
Received call from Dr Allyne Gee.  Pt is scheduled for eye surgery on Thursday 01/14/16.  Wants to know if pt can come off Effient prior to surgery.   Please advise ASAP.   I will fax recommendations back to his office. Thanks, Misty Stanley

## 2016-01-12 NOTE — Telephone Encounter (Signed)
Can be held for 3-5 days prior to surgery.

## 2016-01-14 DIAGNOSIS — H35372 Puckering of macula, left eye: Secondary | ICD-10-CM | POA: Diagnosis not present

## 2016-01-14 DIAGNOSIS — H3530 Unspecified macular degeneration: Secondary | ICD-10-CM | POA: Diagnosis not present

## 2016-01-14 DIAGNOSIS — H35371 Puckering of macula, right eye: Secondary | ICD-10-CM | POA: Diagnosis not present

## 2016-01-14 DIAGNOSIS — H35351 Cystoid macular degeneration, right eye: Secondary | ICD-10-CM | POA: Diagnosis not present

## 2016-01-22 DIAGNOSIS — H35371 Puckering of macula, right eye: Secondary | ICD-10-CM | POA: Diagnosis not present

## 2016-01-22 DIAGNOSIS — H3581 Retinal edema: Secondary | ICD-10-CM | POA: Diagnosis not present

## 2016-02-05 DIAGNOSIS — H524 Presbyopia: Secondary | ICD-10-CM | POA: Diagnosis not present

## 2016-02-12 DIAGNOSIS — H35371 Puckering of macula, right eye: Secondary | ICD-10-CM | POA: Diagnosis not present

## 2016-02-12 DIAGNOSIS — H3581 Retinal edema: Secondary | ICD-10-CM | POA: Diagnosis not present

## 2016-02-22 ENCOUNTER — Other Ambulatory Visit: Payer: Self-pay | Admitting: Nurse Practitioner

## 2016-04-30 ENCOUNTER — Other Ambulatory Visit: Payer: Self-pay | Admitting: Nurse Practitioner

## 2016-05-05 ENCOUNTER — Other Ambulatory Visit: Payer: Self-pay | Admitting: Nurse Practitioner

## 2016-05-05 NOTE — Telephone Encounter (Signed)
Last refill without being seen 

## 2016-05-24 ENCOUNTER — Other Ambulatory Visit: Payer: Self-pay | Admitting: Nurse Practitioner

## 2016-05-24 DIAGNOSIS — E785 Hyperlipidemia, unspecified: Secondary | ICD-10-CM

## 2016-05-30 ENCOUNTER — Other Ambulatory Visit: Payer: Self-pay | Admitting: Nurse Practitioner

## 2016-06-02 ENCOUNTER — Encounter: Payer: Self-pay | Admitting: Pharmacist

## 2016-06-02 ENCOUNTER — Ambulatory Visit (INDEPENDENT_AMBULATORY_CARE_PROVIDER_SITE_OTHER): Payer: Medicare Other | Admitting: Pharmacist

## 2016-06-02 VITALS — BP 138/80 | HR 68 | Ht 70.0 in | Wt 188.0 lb

## 2016-06-02 DIAGNOSIS — E782 Mixed hyperlipidemia: Secondary | ICD-10-CM

## 2016-06-02 DIAGNOSIS — D649 Anemia, unspecified: Secondary | ICD-10-CM

## 2016-06-02 DIAGNOSIS — I10 Essential (primary) hypertension: Secondary | ICD-10-CM | POA: Diagnosis not present

## 2016-06-02 DIAGNOSIS — E8881 Metabolic syndrome: Secondary | ICD-10-CM

## 2016-06-02 DIAGNOSIS — Z23 Encounter for immunization: Secondary | ICD-10-CM

## 2016-06-02 DIAGNOSIS — Z79899 Other long term (current) drug therapy: Secondary | ICD-10-CM | POA: Diagnosis not present

## 2016-06-02 DIAGNOSIS — Z0000 Encounter for general adult medical examination without abnormal findings: Secondary | ICD-10-CM

## 2016-06-02 LAB — BAYER DCA HB A1C WAIVED: HB A1C: 5.8 % (ref <7.0)

## 2016-06-02 NOTE — Patient Instructions (Addendum)
Adam Rowland , Thank you for taking time to come for your Medicare Wellness Visit. I appreciate your ongoing commitment to your health goals. Please review the following plan we discussed and let me know if I can assist you in the future.   These are the goals we discussed:  Call to make appointment with cardiologist - [phone]  Continue to exercise daily - great job - goal is 150 minutes of exercise per week.    Increase non-starchy vegetables - carrots, green bean, squash, zucchini, tomatoes, onions, peppers, spinach and other green leafy vegetables, cabbage, lettuce, cucumbers, asparagus, okra (not fried), eggplant Limit sugar and processed foods (cakes, cookies, ice cream, crackers and chips) Increase fresh fruit but limit serving sizes 1/2 cup or about the size of tennis or baseball Limit red meat to no more than 1-2 times per week (serving size about the size of your palm) Choose whole grains / lean proteins - whole wheat bread, quinoa, whole grain rice (1/2 cup), fish, chicken, [place] Avoid sugar and calorie containing beverages - soda, sweet tea and juice.  Choose water or unsweetened tea instead.    This is a list of the screening recommended for you and due dates:  Health Maintenance  Topic Date Due  . Flu Shot  03/29/2017  . Colon Cancer Screening  11/30/2022  . Tetanus Vaccine  12/13/2022  . Shingles Vaccine  Completed  .  Hepatitis C: One time screening is recommended by Center for Disease Control  (CDC) for  adults born from [birth year] through 1965.   Completed  . Pneumonia vaccines  Completed   Health Maintenance, Male A healthy lifestyle and preventative care can promote health and wellness.  Maintain regular health, dental, and eye exams.  Eat a healthy diet. Foods like vegetables, fruits, whole grains, low-fat dairy products, and lean protein foods contain the nutrients you need and are low in calories. Decrease your intake of foods high in solid fats, added sugars, and salt.  Get information about a proper diet from your health care provider, if necessary.  Regular physical exercise is one of the most important things you can do for your health. Most adults should get at least 150 minutes of moderate-intensity exercise (any activity that increases your heart rate and causes you to sweat) each week. In addition, most adults need muscle-strengthening exercises on 2 or more days a week.   Maintain a healthy weight. The body mass index (BMI) is a screening tool to identify possible weight problems. It provides an estimate of body fat based on height and weight. Your health care provider can find your BMI and can help you achieve or maintain a healthy weight. For males 20 years and older:  A BMI below 18.5 is considered underweight.  A BMI of 18.5 to 24.9 is normal.  A BMI of 25 to 29.9 is considered overweight.  A BMI of 30 and above is considered obese.  Maintain normal blood lipids and cholesterol by exercising and minimizing your intake of saturated fat. Eat a balanced diet with plenty of fruits and vegetables. Blood tests for lipids and cholesterol should begin at age 22 and be repeated every 5 years. If your lipid or cholesterol levels are high, you are over age 51, or you are at high risk for heart disease, you may need your cholesterol levels checked more frequently.Ongoing high lipid and cholesterol levels should be treated with medicines if diet and exercise are not working.  If you smoke, find out from  your health care provider how to quit. If you do not use tobacco, do not start.  Lung cancer screening is recommended for adults aged 55-80 years who are at high risk for developing lung cancer because of a history of smoking. A yearly low-dose CT scan of the lungs is recommended for people who have at least a 30-pack-year history of smoking and are current smokers or have quit within the past 15 years. A pack year of smoking is smoking an average of 1 pack of  cigarettes a day for 1 year (for example, a 30-pack-year history of smoking could mean smoking 1 pack a day for 30 years or 2 packs a day for 15 years). Yearly screening should continue until the smoker has stopped smoking for at least 15 years. Yearly screening should be stopped for people who develop a health problem that would prevent them from having lung cancer treatment.  If you choose to drink alcohol, do not have more than 2 drinks per day. One drink is considered to be 12 oz (360 mL) of beer, 5 oz (150 mL) of wine, or 1.5 oz (45 mL) of liquor.  Avoid the use of street drugs. Do not share needles with anyone. Ask for help if you need support or instructions about stopping the use of drugs.  High blood pressure causes heart disease and increases the risk of stroke. High blood pressure is more likely to develop in:  People who have blood pressure in the end of the normal range (100-139/85-89 mm Hg).  People who are overweight or obese.  People who are African American.  If you are 58-54 years of age, have your blood pressure checked every 3-5 years. If you are 1 years of age or older, have your blood pressure checked every year. You should have your blood pressure measured twice--once when you are at a hospital or clinic, and once when you are not at a hospital or clinic. Record the average of the two measurements. To check your blood pressure when you are not at a hospital or clinic, you can use:  An automated blood pressure machine at a pharmacy.  A home blood pressure monitor.  If you are 61-54 years old, ask your health care provider if you should take aspirin to prevent heart disease.  Diabetes screening involves taking a blood sample to check your fasting blood sugar level. This should be done once every 3 years after age 60 if you are at a normal weight and without risk factors for diabetes. Testing should be considered at a younger age or be carried out more frequently if you are  overweight and have at least 1 risk factor for diabetes.  Colorectal cancer can be detected and often prevented. Most routine colorectal cancer screening begins at the age of 5 and continues through age 99. However, your health care provider may recommend screening at an earlier age if you have risk factors for colon cancer. On a yearly basis, your health care provider may provide home test kits to check for hidden blood in the stool. A small camera at the end of a tube may be used to directly examine the colon (sigmoidoscopy or colonoscopy) to detect the earliest forms of colorectal cancer. Talk to your health care provider about this at age 4 when routine screening begins. A direct exam of the colon should be repeated every 5-10 years through age 11, unless early forms of precancerous polyps or small growths are found.  People who  are at an increased risk for hepatitis B should be screened for this virus. You are considered at high risk for hepatitis B if:  You were born in a country where hepatitis B occurs often. Talk with your health care provider about which countries are considered high risk.  Your parents were born in a high-risk country and you have not received a shot to protect against hepatitis B (hepatitis B vaccine).  You have HIV or AIDS.  You use needles to inject street drugs.  You live with, or have sex with, someone who has hepatitis B.  You are a man who has sex with other men (MSM).  You get hemodialysis treatment.  You take certain medicines for conditions like cancer, organ transplantation, and autoimmune conditions.  Hepatitis C blood testing is recommended for all people born from [birth year] through 1965 and any individual with known risk factors for hepatitis C.  Healthy men should no longer receive prostate-specific antigen (PSA) blood tests as part of routine cancer screening. Talk to your health care provider about prostate cancer screening.  Testicular cancer  screening is not recommended for adolescents or adult males who have no symptoms. Screening includes self-exam, a health care provider exam, and other screening tests. Consult with your health care provider about any symptoms you have or any concerns you have about testicular cancer.  Practice safe sex. Use condoms and avoid high-risk sexual practices to reduce the spread of sexually transmitted infections (STIs).  You should be screened for STIs, including gonorrhea and chlamydia if:  You are sexually active and are younger than 24 years.  You are older than 24 years, and your health care provider tells you that you are at risk for this type of infection.  Your sexual activity has changed since you were last screened, and you are at an increased risk for chlamydia or gonorrhea. Ask your health care provider if you are at risk.  If you are at risk of being infected with HIV, it is recommended that you take a prescription medicine daily to prevent HIV infection. This is called pre-exposure prophylaxis (PrEP). You are considered at risk if:  You are a man who has sex with other men (MSM).  You are a heterosexual man who is sexually active with multiple partners.  You take drugs by injection.  You are sexually active with a partner who has HIV.  Talk with your health care provider about whether you are at high risk of being infected with HIV. If you choose to begin PrEP, you should first be tested for HIV. You should then be tested every 3 months for as long as you are taking PrEP.  Use sunscreen. Apply sunscreen liberally and repeatedly throughout the day. You should seek shade when your shadow is shorter than you. Protect yourself by wearing long sleeves, pants, a wide-brimmed hat, and sunglasses year round whenever you are outdoors.  Tell your health care provider of new moles or changes in moles, especially if there is a change in shape or color. Also, tell your health care provider if a  mole is larger than the size of a pencil eraser.  A one-time screening for abdominal aortic aneurysm (AAA) and surgical repair of large AAAs by ultrasound is recommended for men aged 65-75 years who are current or former smokers.  Stay current with your vaccines (immunizations).   This information is not intended to replace advice given to you by your health care provider. Make sure you discuss any  questions you have with your health care provider.   Document Released: 02/11/2008 Document Revised: 09/05/2014 Document Reviewed: 01/10/2011 Elsevier Interactive Patient Education [employer]2016 Elsevier Inc.

## 2016-06-02 NOTE — Progress Notes (Signed)
Subjective:   Adam Rowland is a 70 y.o. male who presents for a sbusequent Medicare Annual Wellness Visit.  [name] is married and lives in [place], [place].  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    Since our last visit last year Mr. Delaine reports that he is doing well.  His ankle is feeling good with the exception of when he works outside or walks long distances or when the weather changes he has a little pain but this pain does not limit his activity.      Current Medications (verified) Outpatient Encounter Prescriptions as of 06/02/2016  Medication Sig  . aspirin EC 81 MG EC tablet Take 1 tablet (81 mg total) by mouth daily.  Marland Kitchen atorvastatin (LIPITOR) 40 MG tablet Take 1 tablet (40 mg total) by mouth daily.  Marland Kitchen EFFIENT 10 MG TABS tablet TAKE 1 TABLET (10 MG TOTAL) BY MOUTH DAILY.  Marland Kitchen lisinopril (PRINIVIL,ZESTRIL) 10 MG tablet TAKE 1 TABLET DAILY  . metoprolol tartrate (LOPRESSOR) 25 MG tablet Take 1 tablet (25 mg total) by mouth 2 (two) times daily.  . nitroGLYCERIN (NITROSTAT) 0.4 MG SL tablet Place 1 tablet (0.4 mg total) under the tongue every 5 (five) minutes x 3 doses as needed for chest pain.   No facility-administered encounter medications on file as of 06/02/2016.     Allergies (verified) Review of patient's allergies indicates no known allergies.   History: Past Medical History:  Diagnosis Date  . CAD (coronary artery disease)    a. 05/2014 NSTEMI/PCI: LM nl, LAD 43m(3.0x23 Xience Alpine DES), D1 90, LCX 839mOM1 80, RCA 30p/d, RPDA 40-50p, EF 40-45% ->Plan for staged PCI of LCX/OM1.  . Cardiomyopathy, ischemic    a. 05/2014 Echo: Ef 45-50%, mid-apicalanteroseptal AK, Gr 2 DD.  . Marland Kitchenataract   . Fracture, ankle 06/12/2014  . Hyperlipidemia   . Hypertension   . Myocardial infarction 2015   non STEMI  . Nephrolithiasis 01/16/2013  . [place]   a. 05/2014 in setting of NSTEMI -   asymptomatic.   Past Surgical History:  Procedure Laterality Date  . ANKLE FRACTURE SURGERY    . CARDIAC CATHETERIZATION    . CARDIAC CATHETERIZATION Right 06/20/2014   Procedure: CORONARY STENT INTERVENTION;  Surgeon: Peter M JoMartiniqueMD;  [facility];  Service: Cardiovascular;  Laterality: Right;  . CATARACT EXTRACTION Bilateral   . CORONARY STENT PLACEMENT    . LEFT HEART CATHETERIZATION WITH CORONARY ANGIOGRAM N/A 06/20/2014   Procedure: LEFT HEART CATHETERIZATION WITH CORONARY ANGIOGRAM;  Surgeon: Peter M JoMartiniqueMD;  [facility];  Service: Cardiovascular;  Laterality: N/A;  . NASAL SEPTUM SURGERY    . VASECTOMY     Family History  Problem Relation Age of Onset  . Hypertension Mother   . Cancer Father     bladder ca  . Hyperlipidemia Father   . Alcohol abuse Father   . Hyperlipidemia Sister   . Cancer Brother   . Nephrolithiasis Brother   . Colon cancer Neg Hx   . Stomach cancer Neg Hx   . Esophageal cancer Neg Hx    Social History   Occupational History  . Not on file.   Social History Main Topics  . Smoking status: Former Smoker    Packs/day: 1.00    Years: 23.00    Types: Cigarettes    Start date: 10/26/1964  Quit date: 08/30/1987  . Smokeless tobacco: Former Systems developer    Types: [place] date: 02/27/1988     Comment: quit in 1989, smoked since he was a teenager/chewed tobacco for about 6 months after he quit smoking cigarettes  . Alcohol use 4.2 oz/week    7 Glasses of wine per week     Comment: 1 glass of wine nightly   . Drug use: No  . Sexual activity: Yes    Do you feel safe at home?  Yes Are there smokers in your home (other than you)? No  Dietary issues and exercise activities discussed: Current Exercise Habits: Home exercise routine, Time (Minutes): 45, Frequency (Times/Week): 7, Weekly Exercise (Minutes/Week): 315, Intensity: Moderate  Current Dietary habits:  Tries to limit sugar intake due to slightly elevated BG at our last  visit.  Eats lots of non starchy vegetables.    Cardiac Risk Factors include: advanced age (>39mn, >>38women);dyslipidemia;hypertension;male gender  Objective:    Today's Vitals   06/02/16 0844  BP: (!) 142/82  Pulse: 68  Weight: 188 lb (85.3 kg)  Height: _0  (1.778 m)   Body mass index is 26.98 kg/m.   Activities of Daily Living In your present state of health, do you have any difficulty performing the following activities: 06/02/2016  Hearing? N  Vision? N  Difficulty concentrating or making decisions? N  Walking or climbing stairs? N  Dressing or bathing? N  Doing errands, shopping? N  Preparing Food and eating ? N  Using the Toilet? N  In the past six months, have you accidently leaked urine? N  Do you have problems with loss of bowel control? N  Managing your Medications? N  Managing your Finances? N  Housekeeping or managing your Housekeeping? N  Some recent data might be hidden     Depression Screen PHQ 2/9 Scores 06/02/2016 05/27/2015 05/21/2015 02/16/2015  PHQ - 2 Score 0 0 0 0     Fall Risk Fall Risk  06/02/2016 05/27/2015 05/21/2015 02/16/2015 10/23/2014  Falls in the past year? No No No No Yes  Number falls in past yr: - - - - 1  Injury with Fall? - - - - Yes    Cognitive Function: MMSE - Mini Mental State Exam 06/02/2016 05/27/2015  Orientation to time 5 5  Orientation to Place 5 5  Registration 3 3  Attention/ Calculation 4 5  Recall - 3  Language- name 2 objects 2 2  Language- repeat 1 -  Language- follow 3 step command 3 3  Language- read & follow direction 1 1  Write a sentence 1 1  Copy design 1 1    Immunizations and Health Maintenance Immunization History  Administered Date(s) Administered  . Influenza Split 10/08/2012, 07/09/2013  . Influenza,inj,Quad PF,36+ Mos 06/21/2014, 05/27/2015  . Pneumococcal Conjugate-13 02/16/2015  . Pneumococcal Polysaccharide-23 12/12/2012  . Tdap 12/12/2012  . Zoster 10/08/2012   Health Maintenance Due   Topic Date Due  . INFLUENZA VACCINE  03/29/2016    Patient Care Team: MChevis Pretty FParksas PCP - General (Nurse Practitioner) DGlennie Isle PA-C as Consulting Physician (Physician Assistant) SHerminio Commons MD as Consulting Physician (Cardiology) JWylene Simmer MD as Consulting Physician (Orthopedic Surgery) Truc LManus Gunning OD as Consulting Physician (Optometry) JSherlynn Stalls MD as Consulting Physician (Ophthalmology)  Indicate any recent Medical Services you may have received from other than [facility] providers in the past year (date may be approximate).    Assessment:  Annual Wellness Visit  Pre diabetes - A1c has increased from 5.6% to 5.8% Overweight  Screening Tests Health Maintenance  Topic Date Due  . INFLUENZA VACCINE  03/29/2016  . COLONOSCOPY  11/30/2022  . TETANUS/TDAP  12/13/2022  . ZOSTAVAX  Completed  . Hepatitis C Screening  Completed  . PNA vac Low Risk Adult  Completed        Plan:   During the course of the visit [place] was educated and counseled about the following appropriate screening and preventive services:   Vaccines to include Pneumoccal, Influenza,  Td, Zostavax - receiving influenza vaccine in office today - all other vaccines are UTD  Colorectal cancer screening - UTD - FOBT and colonoscopy  Recommended APAP 500-620m up to tid as needed for ankle / arthritis pain  Cardiovascular disease screening - sees Dr KBronson Ingq6 months.  He is past due his next appt.  Recommended he call office to make appt  Diabetes screening - checked A1c and FBG today.  A1c has increased from 5.6% last year to 5.8% this year and is now in prediabetic range  Glaucoma screening /  Eye Exam - UTD  Nutrition counseling - discussed limiting sugar intake more, increase whole grains and non starchy vegetables.  Discussed weight goal of 180lbs.   Prostate cancer screening - UTD  Advanced Directives - requested copy of AD  Physical Activity - continue  to use stationary bike daily.   Great job - goal is to exercise 150 minutes each week.  appt made for follow up with PCP  Orders Placed This Encounter  Procedures  . Bayer DCA Hb A1c Waived  . CMP14+EGFR  . Lipid panel  . CBC with Differential/Platelet     Patient Instructions (the written plan) were given to the patient.   ECherre Robins PharmD   06/02/2016

## 2016-06-03 ENCOUNTER — Encounter: Payer: Self-pay | Admitting: Pharmacist

## 2016-06-03 LAB — CMP14+EGFR
ALBUMIN: 4.4 g/dL (ref 3.6–4.8)
ALK PHOS: 56 IU/L (ref 39–117)
ALT: 24 IU/L (ref 0–44)
AST: 20 IU/L (ref 0–40)
Albumin/Globulin Ratio: 1.7 (ref 1.2–2.2)
BUN / CREAT RATIO: 13 (ref 10–24)
BUN: 11 mg/dL (ref 8–27)
Bilirubin Total: 0.9 mg/dL (ref 0.0–1.2)
CO2: 24 mmol/L (ref 18–29)
CREATININE: 0.82 mg/dL (ref 0.76–1.27)
Calcium: 9.3 mg/dL (ref 8.6–10.2)
Chloride: 103 mmol/L (ref 96–106)
GFR calc Af Amer: 104 mL/min/{1.73_m2} (ref >59)
GFR, EST NON AFRICAN AMERICAN: 90 mL/min/{1.73_m2} (ref >59)
GLOBULIN, TOTAL: 2.6 g/dL (ref 1.5–4.5)
Glucose: 128 mg/dL — ABNORMAL HIGH (ref 65–99)
Potassium: 4.5 mmol/L (ref 3.5–5.2)
SODIUM: 142 mmol/L (ref 134–144)
Total Protein: 7 g/dL (ref 6.0–8.5)

## 2016-06-03 LAB — CBC WITH DIFFERENTIAL/PLATELET
BASOS ABS: 0.1 10*3/uL (ref 0.0–0.2)
Basos: 1 %
EOS (ABSOLUTE): 0.2 10*3/uL (ref 0.0–0.4)
Eos: 3 %
Hematocrit: 44.1 % (ref 37.5–51.0)
Hemoglobin: 15.5 g/dL (ref 12.6–17.7)
IMMATURE GRANS (ABS): 0 10*3/uL (ref 0.0–0.1)
IMMATURE GRANULOCYTES: 0 %
LYMPHS: 27 %
Lymphocytes Absolute: 1.7 10*3/uL (ref 0.7–3.1)
MCH: 33.7 pg — ABNORMAL HIGH (ref 26.6–33.0)
MCHC: 35.1 g/dL (ref 31.5–35.7)
MCV: 96 fL (ref 79–97)
Monocytes Absolute: 0.4 10*3/uL (ref 0.1–0.9)
Monocytes: 6 %
NEUTROS PCT: 63 %
Neutrophils Absolute: 4 10*3/uL (ref 1.4–7.0)
PLATELETS: 250 10*3/uL (ref 150–379)
RBC: 4.6 x10E6/uL (ref 4.14–5.80)
RDW: 12.8 % (ref 12.3–15.4)
WBC: 6.4 10*3/uL (ref 3.4–10.8)

## 2016-06-03 LAB — LIPID PANEL
CHOL/HDL RATIO: 3.9 ratio (ref 0.0–5.0)
CHOLESTEROL TOTAL: 142 mg/dL (ref 100–199)
HDL: 36 mg/dL — ABNORMAL LOW (ref >39)
LDL Calculated: 81 mg/dL (ref 0–99)
Triglycerides: 127 mg/dL (ref 0–149)
VLDL Cholesterol Cal: 25 mg/dL (ref 5–40)

## 2016-06-30 ENCOUNTER — Other Ambulatory Visit: Payer: Self-pay | Admitting: Nurse Practitioner

## 2016-07-01 ENCOUNTER — Encounter: Payer: Self-pay | Admitting: Nurse Practitioner

## 2016-07-01 ENCOUNTER — Ambulatory Visit (INDEPENDENT_AMBULATORY_CARE_PROVIDER_SITE_OTHER): Payer: Medicare Other | Admitting: Nurse Practitioner

## 2016-07-01 VITALS — BP 138/71 | HR 62 | Temp 97.7°F | Ht 70.0 in | Wt 191.0 lb

## 2016-07-01 DIAGNOSIS — Z6827 Body mass index (BMI) 27.0-27.9, adult: Secondary | ICD-10-CM | POA: Diagnosis not present

## 2016-07-01 DIAGNOSIS — I2510 Atherosclerotic heart disease of native coronary artery without angina pectoris: Secondary | ICD-10-CM

## 2016-07-01 DIAGNOSIS — I10 Essential (primary) hypertension: Secondary | ICD-10-CM | POA: Diagnosis not present

## 2016-07-01 DIAGNOSIS — E782 Mixed hyperlipidemia: Secondary | ICD-10-CM | POA: Diagnosis not present

## 2016-07-01 MED ORDER — ATORVASTATIN CALCIUM 40 MG PO TABS: 40.0000 mg | ORAL_TABLET | Freq: Every day | ORAL | 5 refills | Status: DC

## 2016-07-01 MED ORDER — METOPROLOL TARTRATE 25 MG PO TABS: ORAL_TABLET | ORAL | 1 refills | Status: DC

## 2016-07-01 MED ORDER — ATORVASTATIN CALCIUM 40 MG PO TABS: 40.0000 mg | ORAL_TABLET | Freq: Every day | ORAL | 0 refills | Status: DC

## 2016-07-01 MED ORDER — LISINOPRIL 10 MG PO TABS: 10.0000 mg | ORAL_TABLET | Freq: Every day | ORAL | 5 refills | Status: DC

## 2016-07-01 NOTE — Progress Notes (Signed)
Subjective:    Patient ID: Adam Rowland, male    DOB: 1946/03/20, 70 y.o.   MRN: [id number]  Patient here today for follow up of chronic medical problems. NO changes since last visit. No complaints today.  Hyperlipidemia  This is a chronic problem. The problem is controlled. Recent lipid tests were reviewed and are normal. He has no history of diabetes, hypothyroidism or obesity. Pertinent negatives include no chest pain, myalgias or shortness of breath. Current antihyperlipidemic treatment includes statins. The current treatment provides moderate improvement of lipids. Compliance problems include adherence to diet and adherence to exercise.  Risk factors for coronary artery disease include dyslipidemia, family history, hypertension and male sex.  Hypertension  This is a chronic problem. The current episode started more than 1 year ago. The problem is unchanged. The problem is controlled. Pertinent negatives include no chest pain, palpitations or shortness of breath. There are no associated agents to hypertension. Past treatments include ACE inhibitors. The current treatment provides moderate improvement. Compliance problems include diet and exercise.  Hypertensive end-organ damage includes CAD/MI.  CAD Fell and broke ankle and doing having surgery was found to have had heart attack in past; did cardiac cath and found blockage 99 % in LAD and had stent put in and he also had two arteries that were 80% occluded and they are going to watch those. Has follow up appointment with cardiologist in December 2017. Metabolic syndrome Patient does not watch blood sugars or diet.     Review of Systems  Constitutional: Positive for diaphoresis. Negative for fatigue.  HENT: Negative.   Eyes: Negative.   Respiratory: Negative for chest tightness and shortness of breath.   Cardiovascular: Negative for chest pain and palpitations.  Gastrointestinal: Negative.   Endocrine: Negative.   Genitourinary: Negative.    Musculoskeletal: Negative.  Negative for myalgias.  Neurological: Negative.   Hematological: Negative.   Psychiatric/Behavioral: Negative.   All other systems reviewed and are negative.      Objective:   Physical Exam  Constitutional: He is oriented to person, place, and time. He appears well-developed and well-nourished.  HENT:  Head: Normocephalic.  Right Ear: External ear normal.  Left Ear: External ear normal.  Nose: Nose normal.  Mouth/Throat: Oropharynx is clear and moist.  Eyes: EOM are normal. Pupils are equal, round, and reactive to light.  Neck: Normal range of motion. Neck supple. No JVD present. No thyromegaly present.  Cardiovascular: Normal rate, regular rhythm, normal heart sounds and intact distal pulses.  Exam reveals no gallop and no friction rub.   No murmur heard. Pulmonary/Chest: Effort normal and breath sounds normal. No respiratory distress. He has no wheezes. He has no rales. He exhibits no tenderness.  Abdominal: Soft. Bowel sounds are normal. He exhibits no mass. There is no tenderness.  Genitourinary: Prostate normal and penis normal.  Musculoskeletal: Normal range of motion. He exhibits edema (left lower ext- 1+).  Lymphadenopathy:    He has no cervical adenopathy.  Neurological: He is alert and oriented to person, place, and time. No cranial nerve deficit.  Skin: Skin is warm and dry.  Psychiatric: He has a normal mood and affect. His behavior is normal. Judgment and thought content normal.   BP 138/71   Pulse 62   Temp 97.7 F (36.5 C) (Oral)   Ht 5\' 10"  (1.778 m)   Wt 191 lb (86.6 kg)   BMI 27.41 kg/m        Assessment & Plan:   1. Coronary  artery disease involving native coronary artery of native heart without angina pectoris keep appointment with cardiology - metoprolol tartrate (LOPRESSOR) 25 MG tablet; Take 1 tablet (25 mg total) by mouth 2 (two) times daily.  Dispense: 180 tablet; Refill: 1  2. Essential hypertension Low sodium  diet - lisinopril (PRINIVIL,ZESTRIL) 10 MG tablet; Take 1 tablet (10 mg total) by mouth daily.  Dispense: 30 tablet; Refill: 5  3. BMI 27.0-27.9,adult Discussed diet and exercise for person with BMI >25 Will recheck weight in 3-6 months  4. Mixed hyperlipidemia Low fat diet - atorvastatin (LIPITOR) 40 MG tablet; Take 1 tablet (40 mg total) by mouth daily.  Dispense: 30 tablet; Refill: 5 - Lipid panel    Labs pending Health maintenance reviewed Diet and exercise encouraged Continue all meds Follow up  In 3 months   [name] Daphine Deutscher, FNP

## 2016-07-01 NOTE — Patient Instructions (Signed)
Low-Sodium Eating Plan °Sodium raises blood pressure and causes water to be held in the body. Getting less sodium from food will help lower your blood pressure, reduce any swelling, and protect your heart, liver, and kidneys. We get sodium by adding salt (sodium chloride) to food. Most of our sodium comes from canned, boxed, and frozen foods. Restaurant foods, fast foods, and pizza are also very high in sodium. Even if you take medicine to lower your blood pressure or to reduce fluid in your body, getting less sodium from your food is important. °WHAT IS MY PLAN? °Most people should limit their sodium intake to 2,300 mg a day. Your health care provider recommends that you limit your sodium intake to __________ a day.  °WHAT DO I NEED TO KNOW ABOUT THIS EATING PLAN? °For the low-sodium eating plan, you will follow these general guidelines: °· Choose foods with a % Daily Value for sodium of less than 5% (as listed on the food label).   °· Use salt-free seasonings or herbs instead of table salt or sea salt.   °· Check with your health care provider or pharmacist before using salt substitutes.   °· Eat fresh foods. °· Eat more vegetables and fruits. °· Limit canned vegetables. If you do use them, rinse them well to decrease the sodium.   °· Limit cheese to 1 oz (28 g) per day.    °· Eat lower-sodium products, often labeled as "lower sodium" or "no salt added." °· Avoid foods that contain monosodium glutamate (MSG). MSG is sometimes added to Chinese food and some canned foods.   °· Check food labels (Nutrition Facts labels) on foods to learn how much sodium is in one serving. °· Eat more home-cooked food and less restaurant, buffet, and fast food.  °· When eating at a restaurant, ask that your food be prepared with less salt, or no salt if possible.   °HOW DO I READ FOOD LABELS FOR SODIUM INFORMATION? °The Nutrition Facts label lists the amount of sodium in one serving of the food. If you eat more than one serving, you  must multiply the listed amount of sodium by the number of servings. °Food labels may also identify foods as: °· Sodium free--Less than 5 mg in a serving. °· Very low sodium--35 mg or less in a serving. °· Low sodium--140 mg or less in a serving. °· Light in sodium--50% less sodium in a serving. For example, if a food that usually has 300 mg of sodium is changed to become light in sodium, it will have 150 mg of sodium. °· Reduced sodium--25% less sodium in a serving. For example, if a food that usually has 400 mg of sodium is changed to reduced sodium, it will have 300 mg of sodium. °WHAT FOODS CAN I EAT? °Grains  °Low-sodium cereals, including oats, puffed wheat and rice, and shredded wheat cereals. Low-sodium crackers. Unsalted rice and pasta. Lower-sodium bread.  °Vegetables  °Frozen or fresh vegetables. Low-sodium or reduced-sodium canned vegetables. Low-sodium or reduced-sodium tomato sauce and paste. Low-sodium or reduced-sodium tomato and vegetable juices.  °Fruits  °Fresh, frozen, and canned fruit. Fruit juice.  °Meat and Other Protein Products  °Low-sodium canned tuna and salmon. Fresh or frozen meat, poultry, seafood, and fish. Lamb. Unsalted nuts. Dried beans, peas, and lentils without added salt. Unsalted canned beans. Homemade soups without salt. Eggs.  °Dairy  °Milk. Soy milk. Ricotta cheese. Low-sodium or reduced-sodium cheeses. Yogurt.  °Condiments  °Fresh and dried herbs and spices. Salt-free seasonings. Onion and garlic powders. Low-sodium varieties of mustard and ketchup. Fresh or refrigerated horseradish. Lemon   juice.  °Fats and Oils   °Reduced-sodium salad dressings. Unsalted butter.   °Other  °Unsalted popcorn and pretzels.  °The items listed above may not be a complete list of recommended foods or beverages. Contact your dietitian for more options. °WHAT FOODS ARE NOT RECOMMENDED? °Grains  °Instant hot cereals. Bread stuffing, pancake, and biscuit mixes. Croutons. Seasoned rice or pasta mixes.  Noodle soup cups. Boxed or frozen macaroni and cheese. Self-rising flour. Regular salted crackers. °Vegetables  °Regular canned vegetables. Regular canned tomato sauce and paste. Regular tomato and vegetable juices. Frozen vegetables in sauces. Salted French fries. Olives. Pickles. Relishes. Sauerkraut. Salsa. °Meat and Other Protein Products  °Salted, canned, smoked, spiced, or pickled meats, seafood, or fish. Bacon, ham, sausage, hot dogs, corned beef, chipped beef, and packaged luncheon meats. Salt pork. Jerky. Pickled herring. Anchovies, regular canned tuna, and sardines. Salted nuts. °Dairy  °Processed cheese and cheese spreads. Cheese curds. Blue cheese and cottage cheese. Buttermilk.  °Condiments  °Onion and garlic salt, seasoned salt, table salt, and sea salt. Canned and packaged gravies. Worcestershire sauce. Tartar sauce. Barbecue sauce. Teriyaki sauce. Soy sauce, including reduced sodium. Steak sauce. Fish sauce. Oyster sauce. Cocktail sauce. Horseradish that you find on the shelf. Regular ketchup and mustard. Meat flavorings and tenderizers. Bouillon cubes. Hot sauce. Tabasco sauce. Marinades. Taco seasonings. Relishes. °Fats and Oils   °Regular salad dressings. Salted butter. Margarine. Ghee. Bacon fat.  °Other  °Potato and tortilla chips. Corn chips and puffs. Salted popcorn and pretzels. Canned or dried soups. Pizza. Frozen entrees and pot pies.   °The items listed above may not be a complete list of foods and beverages to avoid. Contact your dietitian for more information. °  °This information is not intended to replace advice given to you by your health care provider. Make sure you discuss any questions you have with your health care provider. °  °Document Released: 02/04/2002 Document Revised: 09/05/2014 Document Reviewed: 06/19/2013 °Elsevier Interactive Patient Education ©2016 [employer] Inc[employer] ° °

## 2016-08-04 ENCOUNTER — Encounter: Payer: Self-pay | Admitting: Cardiovascular Disease

## 2016-08-04 ENCOUNTER — Ambulatory Visit (INDEPENDENT_AMBULATORY_CARE_PROVIDER_SITE_OTHER): Payer: Medicare Other | Admitting: Cardiovascular Disease

## 2016-08-04 VITALS — BP 148/72 | HR 65 | Ht 69.0 in | Wt 190.0 lb

## 2016-08-04 DIAGNOSIS — I10 Essential (primary) hypertension: Secondary | ICD-10-CM

## 2016-08-04 DIAGNOSIS — I252 Old myocardial infarction: Secondary | ICD-10-CM | POA: Diagnosis not present

## 2016-08-04 DIAGNOSIS — I2510 Atherosclerotic heart disease of native coronary artery without angina pectoris: Secondary | ICD-10-CM | POA: Diagnosis not present

## 2016-08-04 DIAGNOSIS — E782 Mixed hyperlipidemia: Secondary | ICD-10-CM

## 2016-08-04 DIAGNOSIS — Z955 Presence of coronary angioplasty implant and graft: Secondary | ICD-10-CM

## 2016-08-04 DIAGNOSIS — I255 Ischemic cardiomyopathy: Secondary | ICD-10-CM

## 2016-08-04 DIAGNOSIS — I519 Heart disease, unspecified: Secondary | ICD-10-CM

## 2016-08-04 MED ORDER — LISINOPRIL 20 MG PO TABS: 20.0000 mg | ORAL_TABLET | Freq: Every day | ORAL | 6 refills | Status: DC

## 2016-08-04 NOTE — Patient Instructions (Signed)
Medication Instructions:   Increase Lisinopril to 20mg  daily.    Office will contact with results via phone or letter.    Labwork: none  Testing/Procedures: none  Follow-Up: Your physician wants you to follow up in: 6 months.  You will receive a reminder letter in the mail one-two months in advance.  If you don't receive a letter, please call our office to schedule the follow up appointment   Any Other Special Instructions Will Be Listed Below (If Applicable).  If you need a refill on your cardiac medications before your next appointment, please call your pharmacy.

## 2016-08-04 NOTE — Progress Notes (Signed)
SUBJECTIVE: [name] returns for routine cardiovascular follow-up. He has a history of a non-STEMI in 05/2014 and underwent coronary artery stent placement to the LAD. He has significant residual disease of the left circumflex and first obtuse marginal, and it was felt that a staged approach to additional intervention would be appropriate once he fully recovered from his myocardial infarction. He has an ischemic cardiomyopathy, EF 45-50%. He also has hypertension and hyperlipidemia.  He denies chest pain, palpitations, leg swelling, and shortness of breath.   His mother just celebrated her 99th birthday and still drives by herself and cooks.  Review of Systems: As per "subjective", otherwise negative.  No Known Allergies  Current Outpatient Prescriptions  Medication Sig Dispense Refill  . aspirin EC 81 MG EC tablet Take 1 tablet (81 mg total) by mouth daily.    Marland Kitchen. atorvastatin (LIPITOR) 40 MG tablet Take 1 tablet (40 mg total) by mouth daily. 30 tablet 5  . EFFIENT 10 MG TABS tablet TAKE 1 TABLET (10 MG TOTAL) BY MOUTH DAILY. 90 tablet 3  . lisinopril (PRINIVIL,ZESTRIL) 10 MG tablet Take 1 tablet (10 mg total) by mouth daily. 30 tablet 5  . metoprolol tartrate (LOPRESSOR) 25 MG tablet Take 1 tablet (25 mg total) by mouth 2 (two) times daily. 180 tablet 1  . nitroGLYCERIN (NITROSTAT) 0.4 MG SL tablet Place 1 tablet (0.4 mg total) under the tongue every 5 (five) minutes x 3 doses as needed for chest pain. 25 tablet 3   No current facility-administered medications for this visit.     Past Medical History:  Diagnosis Date  . CAD (coronary artery disease)    a. 05/2014 NSTEMI/PCI: LM nl, LAD 895m (3.0x23 Xience Alpine DES), D1 90, LCX 3135m, OM1 80, RCA 30p/d, RPDA 40-50p, EF 40-45% ->Plan for staged PCI of LCX/OM1.  . Cardiomyopathy, ischemic    a. 05/2014 Echo: Ef 45-50%, mid-apicalanteroseptal AK, Gr 2 DD.  Marland Kitchen. Cataract   . Fracture, ankle 06/12/2014  . Hyperlipidemia   .  Hypertension   . Myocardial infarction 2015   non STEMI  . Nephrolithiasis 01/16/2013  . NSVT (nonsustained ventricular tachycardia) (HCC)    a. 05/2014 in setting of NSTEMI -  asymptomatic.    Past Surgical History:  Procedure Laterality Date  . ANKLE FRACTURE SURGERY    . CARDIAC CATHETERIZATION    . CARDIAC CATHETERIZATION Right 06/20/2014   Procedure: CORONARY STENT INTERVENTION;  Surgeon: Peter M SwazilandJordan, MD;  [facility];  Service: Cardiovascular;  Laterality: Right;  . CATARACT EXTRACTION Bilateral   . CORONARY STENT PLACEMENT    . LEFT HEART CATHETERIZATION WITH CORONARY ANGIOGRAM N/A 06/20/2014   Procedure: LEFT HEART CATHETERIZATION WITH CORONARY ANGIOGRAM;  Surgeon: Peter M SwazilandJordan, MD;  [facility];  Service: Cardiovascular;  Laterality: N/A;  . NASAL SEPTUM SURGERY    . VASECTOMY      Social History   Social History  . Marital status: Married    Spouse name: N/A  . Number of children: N/A  . Years of education: N/A   Occupational History  . Not on file.   Social History Main Topics  . Smoking status: Former Smoker    Packs/day: 1.00    Years: 23.00    Types: Cigarettes    Start date: 10/26/1964    Quit date: 08/30/1987  . Smokeless tobacco: Former NeurosurgeonUser    Types: Chew    Quit date: 02/27/1988     Comment: quit in  1989, smoked since he was a teenager/chewed tobacco for about 6 months after he quit smoking cigarettes  . Alcohol use 4.2 oz/week    7 Glasses of wine per week     Comment: 1 glass of wine nightly   . Drug use: No  . Sexual activity: Yes   Other Topics Concern  . Not on file   Social History Narrative  . No narrative on file     Vitals:   08/04/16 0816  BP: (!) 148/72  Pulse: 65  SpO2: 97%  Weight: 190 lb (86.2 kg)  Height: 5\' 9"  (1.753 m)    PHYSICAL EXAM General: NAD HEENT: Normal. Neck: No JVD, no thyromegaly. Lungs: Clear to auscultation bilaterally with normal respiratory effort. CV: Nondisplaced PMI.   Regular rate and rhythm, normal S1/S2, no S3/S4, no murmur. No pretibial or periankle edema.  No carotid bruit.   Abdomen: Soft, nontender, no distention.  Neurologic: Alert and oriented.  Psych: Normal affect. Skin: Normal. Musculoskeletal: No gross deformities.    ECG: Most recent ECG reviewed.      ASSESSMENT AND PLAN: 1. CAD s/p LAD stent for NSTEMI: Stable ischemic heart disease. Continue aspirin, Lipitor, lisinopril, metoprolol, and Effient. He may eventually require a staged intervention of the left circumflex coronary artery and first obtuse marginal branch if optimal medical therapy fails.  2. Essential HTN: Mildly elevated today. Increase lisinopril to 20 mg.  3. Hyperlipidemia: Lipids checked on 06/02/16 with LDL 81. Continue Lipitor 40 mg daily.  4. Ischemic cardiomyopathy: Symptomatically stable with no evidence of heart failure. No changes to therapy which included an ACE inhibitor and beta blocker.  Dispo: f/u 6 months.   Adam Rowland, M.D., F.A.C.C.

## 2016-10-20 ENCOUNTER — Other Ambulatory Visit: Payer: Self-pay

## 2016-10-20 MED ORDER — PRASUGREL HCL 10 MG PO TABS: ORAL_TABLET | ORAL | 1 refills | Status: DC

## 2016-10-31 DIAGNOSIS — Z961 Presence of intraocular lens: Secondary | ICD-10-CM | POA: Diagnosis not present

## 2016-10-31 DIAGNOSIS — H35451 Secondary pigmentary degeneration, right eye: Secondary | ICD-10-CM | POA: Diagnosis not present

## 2016-10-31 DIAGNOSIS — H35411 Lattice degeneration of retina, right eye: Secondary | ICD-10-CM | POA: Diagnosis not present

## 2016-10-31 DIAGNOSIS — H43813 Vitreous degeneration, bilateral: Secondary | ICD-10-CM | POA: Diagnosis not present

## 2016-12-01 ENCOUNTER — Ambulatory Visit (INDEPENDENT_AMBULATORY_CARE_PROVIDER_SITE_OTHER): Payer: Medicare Other

## 2016-12-01 ENCOUNTER — Ambulatory Visit (INDEPENDENT_AMBULATORY_CARE_PROVIDER_SITE_OTHER): Payer: Medicare Other | Admitting: Nurse Practitioner

## 2016-12-01 ENCOUNTER — Encounter: Payer: Self-pay | Admitting: Nurse Practitioner

## 2016-12-01 VITALS — BP 134/72 | HR 55 | Temp 97.1°F | Ht 69.0 in | Wt 194.0 lb

## 2016-12-01 DIAGNOSIS — I2510 Atherosclerotic heart disease of native coronary artery without angina pectoris: Secondary | ICD-10-CM | POA: Diagnosis not present

## 2016-12-01 DIAGNOSIS — I10 Essential (primary) hypertension: Secondary | ICD-10-CM

## 2016-12-01 DIAGNOSIS — Z6827 Body mass index (BMI) 27.0-27.9, adult: Secondary | ICD-10-CM | POA: Diagnosis not present

## 2016-12-01 DIAGNOSIS — Z125 Encounter for screening for malignant neoplasm of prostate: Secondary | ICD-10-CM | POA: Diagnosis not present

## 2016-12-01 DIAGNOSIS — I214 Non-ST elevation (NSTEMI) myocardial infarction: Secondary | ICD-10-CM

## 2016-12-01 DIAGNOSIS — E782 Mixed hyperlipidemia: Secondary | ICD-10-CM | POA: Diagnosis not present

## 2016-12-01 DIAGNOSIS — Z0000 Encounter for general adult medical examination without abnormal findings: Secondary | ICD-10-CM

## 2016-12-01 DIAGNOSIS — E8881 Metabolic syndrome: Secondary | ICD-10-CM

## 2016-12-01 MED ORDER — METOPROLOL TARTRATE 25 MG PO TABS: ORAL_TABLET | ORAL | 1 refills | Status: DC

## 2016-12-01 MED ORDER — ATORVASTATIN CALCIUM 40 MG PO TABS: 40.0000 mg | ORAL_TABLET | Freq: Every day | ORAL | 5 refills | Status: DC

## 2016-12-01 MED ORDER — PRASUGREL HCL 10 MG PO TABS: ORAL_TABLET | ORAL | 1 refills | Status: DC

## 2016-12-01 MED ORDER — NITROGLYCERIN 0.4 MG SL SUBL: 0.4000 mg | SUBLINGUAL_TABLET | SUBLINGUAL | 3 refills | Status: DC | PRN

## 2016-12-01 MED ORDER — LISINOPRIL 20 MG PO TABS: 20.0000 mg | ORAL_TABLET | Freq: Every day | ORAL | 6 refills | Status: DC

## 2016-12-01 NOTE — Progress Notes (Signed)
Subjective:    Patient ID: Adam Rowland, male    DOB: May 08, 1946, 72 y.o.   MRN: [id number]  HPI  Adam Rowland is here today for annual physical and follow up of chronic medical problem.  Outpatient Encounter Prescriptions as of 12/01/2016  Medication Sig  . aspirin EC 81 MG EC tablet Take 1 tablet (81 mg total) by mouth daily.  Marland Kitchen atorvastatin (LIPITOR) 40 MG tablet Take 1 tablet (40 mg total) by mouth daily.  Marland Kitchen lisinopril (PRINIVIL,ZESTRIL) 20 MG tablet Take 1 tablet (20 mg total) by mouth daily.  . metoprolol tartrate (LOPRESSOR) 25 MG tablet Take 1 tablet (25 mg total) by mouth 2 (two) times daily.  . nitroGLYCERIN (NITROSTAT) 0.4 MG SL tablet Place 1 tablet (0.4 mg total) under the tongue every 5 (five) minutes x 3 doses as needed for chest pain.  . prasugrel (EFFIENT) 10 MG TABS tablet TAKE 1 TABLET (10 MG TOTAL) BY MOUTH DAILY.   No facility-administered encounter medications on file as of 12/01/2016.     1. Coronary artery disease involving native coronary artery of native heart without angina pectoris  Low fat promoted  2. Essential hypertension  Healthy diet and exercise program discussed with the patient  3. NSTEMI (non-ST elevated myocardial infarction) [facility]) Teaching provided on strategies to prevent a cardiovascular event  4. BMI 27.0-27.9,adult  Balanced diet and exercise program discussed with the patient  5. Mixed hyperlipidemia  Low fat diet promoted  6. Metabolic syndrome  Patient instructed to maintain a balanced diet with limited carb intake    New complaints: No new complaints at this visit.    Review of Systems  Constitutional: Negative.  Negative for diaphoresis.  HENT: Negative.   Eyes: Negative.  Negative for pain.  Respiratory: Negative.  Negative for shortness of breath.   Cardiovascular: Negative.  Negative for chest pain, palpitations and leg swelling.  Gastrointestinal: Negative.  Negative for abdominal pain.  Endocrine: Negative.  Negative for  polydipsia.  Genitourinary: Negative.   Musculoskeletal: Negative.   Skin: Negative for rash.  Allergic/Immunologic: Negative.   Neurological: Negative.  Negative for dizziness, weakness and headaches.  Hematological: Does not bruise/bleed easily.  Psychiatric/Behavioral: Negative.   All other systems reviewed and are negative.      Objective:   Physical Exam  Constitutional: He is oriented to person, place, and time. He appears well-developed and well-nourished.  HENT:  Head: Normocephalic.  Right Ear: External ear normal.  Left Ear: External ear normal.  Nose: Nose normal.  Mouth/Throat: Oropharynx is clear and moist.  Eyes: EOM are normal. Pupils are equal, round, and reactive to light.  Neck: Normal range of motion. Neck supple. No JVD present. No thyromegaly present.  Cardiovascular: Normal rate, regular rhythm, normal heart sounds and intact distal pulses.  Exam reveals no gallop and no friction rub.   No murmur heard. Pulmonary/Chest: Effort normal and breath sounds normal. No respiratory distress. He has no wheezes. He has no rales. He exhibits no tenderness.  Abdominal: Soft. Bowel sounds are normal. He exhibits no mass. There is no tenderness.  Genitourinary: Prostate normal and penis normal.  Musculoskeletal: Normal range of motion. He exhibits no edema.  Lymphadenopathy:    He has no cervical adenopathy.  Neurological: He is alert and oriented to person, place, and time. No cranial nerve deficit.  Skin: Skin is warm and dry.  Psychiatric: He has a normal mood and affect. His behavior is normal. Judgment and thought content normal.  BP 134/72   Pulse (!) 55   Temp 97.1 F (36.2 C) (Oral)   Ht 5' 9"  (1.753 m)   Wt 194 lb (88 kg)   BMI 28.65 kg/m   EKG- sinus [place] Abbey, FNP     Assessment & Plan:  1. Annual physical exam  2. Coronary artery disease involving native coronary artery of native heart without angina pectoris -  metoprolol tartrate (LOPRESSOR) 25 MG tablet; Take 1 tablet (25 mg total) by mouth 2 (two) times daily.  Dispense: 180 tablet; Refill: 1  3. Essential hypertension Low sodium diet - lisinopril (PRINIVIL,ZESTRIL) 20 MG tablet; Take 1 tablet (20 mg total) by mouth daily.  Dispense: 30 tablet; Refill: 6 - CMP14+EGFR - DG Chest 2 View; Future  4. NSTEMI (non-ST elevated myocardial infarction) (HCC) - nitroGLYCERIN (NITROSTAT) 0.4 MG SL tablet; Place 1 tablet (0.4 mg total) under the tongue every 5 (five) minutes x 3 doses as needed for chest pain.  Dispense: 25 tablet; Refill: 3 - prasugrel (EFFIENT) 10 MG TABS tablet; TAKE 1 TABLET (10 MG TOTAL) BY MOUTH DAILY.  Dispense: 90 tablet; Refill: 1 - EKG 12-Lead  5. BMI 27.0-27.9,adult Discussed diet and exercise for person with BMI >25 Will recheck weight in 3-6 months  6. Mixed hyperlipidemia Low fta diet - atorvastatin (LIPITOR) 40 MG tablet; Take 1 tablet (40 mg total) by mouth daily.  Dispense: 30 tablet; Refill: 5 - Lipid panel  7. Metabolic syndrome Watch arbs in diet  8. Prostate cancer screening - PSA, total and free    Labs pending Health maintenance reviewed Diet and exercise encouraged Continue all meds Follow up  In 3 months   [place], FNP

## 2016-12-01 NOTE — Addendum Note (Signed)
Addended by: Bennie Pierini on: 12/01/2016 09:16 AM   Modules accepted: Level of Service

## 2016-12-02 LAB — LIPID PANEL
CHOL/HDL RATIO: 3.6 ratio (ref 0.0–5.0)
Cholesterol, Total: 133 mg/dL (ref 100–199)
HDL: 37 mg/dL — AB (ref >39)
LDL Calculated: 72 mg/dL (ref 0–99)
TRIGLYCERIDES: 121 mg/dL (ref 0–149)
VLDL Cholesterol Cal: 24 mg/dL (ref 5–40)

## 2016-12-02 LAB — CMP14+EGFR
ALT: 25 IU/L (ref 0–44)
AST: 22 IU/L (ref 0–40)
Albumin/Globulin Ratio: 1.4 (ref 1.2–2.2)
Albumin: 3.9 g/dL (ref 3.5–4.8)
Alkaline Phosphatase: 54 IU/L (ref 39–117)
BUN/Creatinine Ratio: 17 (ref 10–24)
BUN: 14 mg/dL (ref 8–27)
Bilirubin Total: 0.6 mg/dL (ref 0.0–1.2)
CALCIUM: 9.2 mg/dL (ref 8.6–10.2)
CO2: 24 mmol/L (ref 18–29)
CREATININE: 0.84 mg/dL (ref 0.76–1.27)
Chloride: 103 mmol/L (ref 96–106)
GFR calc Af Amer: 103 mL/min/{1.73_m2} (ref >59)
GFR, EST NON AFRICAN AMERICAN: 89 mL/min/{1.73_m2} (ref >59)
Globulin, Total: 2.8 g/dL (ref 1.5–4.5)
Glucose: 121 mg/dL — ABNORMAL HIGH (ref 65–99)
Potassium: 4.8 mmol/L (ref 3.5–5.2)
Sodium: 142 mmol/L (ref 134–144)
Total Protein: 6.7 g/dL (ref 6.0–8.5)

## 2016-12-02 LAB — PSA, TOTAL AND FREE
PSA, Free Pct: 44.5 %
PSA, Free: 0.49 ng/mL
Prostate Specific Ag, Serum: 1.1 ng/mL (ref 0.0–4.0)

## 2016-12-30 DIAGNOSIS — L814 Other melanin hyperpigmentation: Secondary | ICD-10-CM | POA: Diagnosis not present

## 2016-12-30 DIAGNOSIS — D1801 Hemangioma of skin and subcutaneous tissue: Secondary | ICD-10-CM | POA: Diagnosis not present

## 2016-12-30 DIAGNOSIS — L821 Other seborrheic keratosis: Secondary | ICD-10-CM | POA: Diagnosis not present

## 2016-12-30 DIAGNOSIS — D225 Melanocytic nevi of trunk: Secondary | ICD-10-CM | POA: Diagnosis not present

## 2017-03-07 ENCOUNTER — Ambulatory Visit: Payer: Medicare Other | Admitting: Nurse Practitioner

## 2017-03-10 ENCOUNTER — Encounter: Payer: Self-pay | Admitting: Nurse Practitioner

## 2017-03-10 ENCOUNTER — Ambulatory Visit (INDEPENDENT_AMBULATORY_CARE_PROVIDER_SITE_OTHER): Payer: Medicare Other | Admitting: Nurse Practitioner

## 2017-03-10 VITALS — BP 119/68 | HR 72 | Temp 98.0°F | Ht 69.0 in | Wt 189.0 lb

## 2017-03-10 DIAGNOSIS — I10 Essential (primary) hypertension: Secondary | ICD-10-CM | POA: Diagnosis not present

## 2017-03-10 DIAGNOSIS — E8881 Metabolic syndrome: Secondary | ICD-10-CM

## 2017-03-10 DIAGNOSIS — H6123 Impacted cerumen, bilateral: Secondary | ICD-10-CM

## 2017-03-10 DIAGNOSIS — I2510 Atherosclerotic heart disease of native coronary artery without angina pectoris: Secondary | ICD-10-CM

## 2017-03-10 DIAGNOSIS — Z6827 Body mass index (BMI) 27.0-27.9, adult: Secondary | ICD-10-CM | POA: Diagnosis not present

## 2017-03-10 DIAGNOSIS — E782 Mixed hyperlipidemia: Secondary | ICD-10-CM

## 2017-03-10 NOTE — Progress Notes (Signed)
Subjective:    Patient ID: Adam Rowland, male    DOB: [birth year], 71 y.o.   MRN: [id number]  HPI OSKER AYOUB is here today for follow up of chronic medical problem.  Outpatient Encounter Prescriptions as of 03/10/2017  Medication Sig  . aspirin EC 81 MG EC tablet Take 1 tablet (81 mg total) by mouth daily.  Marland Kitchen atorvastatin (LIPITOR) 40 MG tablet Take 1 tablet (40 mg total) by mouth daily.  Marland Kitchen lisinopril (PRINIVIL,ZESTRIL) 20 MG tablet Take 1 tablet (20 mg total) by mouth daily.  . metoprolol tartrate (LOPRESSOR) 25 MG tablet Take 1 tablet (25 mg total) by mouth 2 (two) times daily.  . nitroGLYCERIN (NITROSTAT) 0.4 MG SL tablet Place 1 tablet (0.4 mg total) under the tongue every 5 (five) minutes x 3 doses as needed for chest pain.  . prasugrel (EFFIENT) 10 MG TABS tablet TAKE 1 TABLET (10 MG TOTAL) BY MOUTH DAILY.   No facility-administered encounter medications on file as of 03/10/2017.     1. Essential hypertension  Patient taking lisinopril and metoprolol for blood pressure management.  Patient checks blood pressure at home once monthly and it is normally 110s/90s.  2. Coronary artery disease involving native coronary artery of native heart without angina pectoris  Anti-platelet prophylaxis with aspirin and prasugrel.  No chest pain currently.  Patient sees cardiologist every 6 months and as needed.  Has an appointment to discuss chest pain that occurs with exercise.  3. Mixed hyperlipidemia  Patient taking atorvastatin for management.  4. BMI 27.0-27.9,adult  Patient has lost 5 pounds since last visit (12/01/16)  5. Metabolic syndrome  Patient not monitoring blood glucose or dietary intake.    New complaints: Wife sent note stating that he is having trouble comprehending what people are saying to him. He says it only occurs when he is around a lot of people. SHe thinks it more then that. SHe also says that he is having trouble with math figures and has to use a calculator to add and  subtract things.    Review of Systems  Constitutional: Negative for activity change, appetite change, fatigue and unexpected weight change.  Respiratory: Negative for cough, shortness of breath and wheezing.   Cardiovascular: Negative for chest pain and palpitations.  Gastrointestinal: Negative for abdominal distention and abdominal pain.  Neurological: Negative for dizziness, light-headedness and headaches.  Hematological: Does not bruise/bleed easily.  All other systems reviewed and are negative.      Objective:   Physical Exam  Constitutional: He is oriented to person, place, and time. He appears well-developed and well-nourished. No distress.  HENT:  Head: Normocephalic.  Right Ear: External ear normal. A foreign body (impacted cerumen) is present.  Left Ear: External ear normal. A foreign body (impacted cerumen) is present.  Mouth/Throat: Oropharynx is clear and moist.  Eyes: Pupils are equal, round, and reactive to light.  Neck: Normal range of motion. Neck supple. No JVD present. No thyromegaly present.  Cardiovascular: Normal rate, regular rhythm, normal heart sounds and intact distal pulses.   No murmur heard. Pulmonary/Chest: Effort normal and breath sounds normal. No respiratory distress. He has no wheezes.  Abdominal: Soft. Bowel sounds are normal. He exhibits no distension. There is no tenderness.  Musculoskeletal: Normal range of motion. He exhibits no edema.  Lymphadenopathy:    He has no cervical adenopathy.  Neurological: He is alert and oriented to person, place, and time.  Skin: Skin is warm and dry.  Psychiatric: He  has a normal mood and affect. His behavior is normal. Judgment and thought content normal.   BP 119/68   Pulse 72   Temp 98 F (36.7 C) (Oral)   Ht 5' 9"  (1.753 m)   Wt 189 lb (85.7 kg)   BMI 27.91 kg/m   MMME-26  S/p  EAR IRRIGATION- TM'S CLEAR BIL    Assessment & Plan:  1. Essential hypertension Low sodium diet - CMP14+EGFR  2.  Coronary artery disease involving native coronary artery of native heart without angina pectoris Keep follow up with cardiology  3. Mixed hyperlipidemia Low fat diet - Lipid panel  4. BMI 27.0-27.9,adult Discussed diet and exercise for person with BMI >25 Will recheck weight in 3-6 months  5. Metabolic syndrome Watch carbs in diet  6. Bilateral impacted cerumen Use debrox on a regular basis Let me know if this helps with understanding people in crowds. If does not hen will need to send for hearing test.    Labs pending Health maintenance reviewed Diet and exercise encouraged Continue all meds Follow up  In 3 months   [place], FNP

## 2017-03-10 NOTE — Patient Instructions (Signed)
Earwax Buildup, Adult The ears produce a substance called earwax that helps keep bacteria out of the ear and protects the skin in the ear canal. Occasionally, earwax can build up in the ear and cause discomfort or hearing loss. What increases the risk? This condition is more likely to develop in people who:  Are male.  Are elderly.  Naturally produce more earwax.  Clean their ears often with cotton swabs.  Use earplugs often.  Use in-ear headphones often.  Wear hearing aids.  Have narrow ear canals.  Have earwax that is overly thick or sticky.  Have eczema.  Are dehydrated.  Have excess hair in the ear canal.  What are the signs or symptoms? Symptoms of this condition include:  Reduced or muffled hearing.  A feeling of fullness in the ear or feeling that the ear is plugged.  Fluid coming from the ear.  Ear pain.  Ear itch.  Ringing in the ear.  Coughing.  An obvious piece of earwax that can be seen inside the ear canal.  How is this diagnosed? This condition may be diagnosed based on:  Your symptoms.  Your medical history.  An ear exam. During the exam, your health care provider will look into your ear with an instrument called an otoscope.  You may have tests, including a hearing test. How is this treated? This condition may be treated by:  Using ear drops to soften the earwax.  Having the earwax removed by a health care provider. The health care provider may: ? Flush the ear with water. ? Use an instrument that has a loop on the end (curette). ? Use a suction device.  Surgery to remove the wax buildup. This may be done in severe cases.  Follow these instructions at home:  Take over-the-counter and prescription medicines only as told by your health care provider.  Do not put any objects, including cotton swabs, into your ear. You can clean the opening of your ear canal with a washcloth or facial tissue.  Follow instructions from your health  care provider about cleaning your ears. Do not over-clean your ears.  Drink enough fluid to keep your urine clear or pale yellow. This will help to thin the earwax.  Keep all follow-up visits as told by your health care provider. If earwax builds up in your ears often or if you use hearing aids, consider seeing your health care provider for routine, preventive ear cleanings. Ask your health care provider how often you should schedule your cleanings.  If you have hearing aids, clean them according to instructions from the manufacturer and your health care provider. Contact a health care provider if:  You have ear pain.  You develop a fever.  You have blood, pus, or other fluid coming from your ear.  You have hearing loss.  You have ringing in your ears that does not go away.  Your symptoms do not improve with treatment.  You feel like the room is spinning (vertigo). Summary  Earwax can build up in the ear and cause discomfort or hearing loss.  The most common symptoms of this condition include reduced or muffled hearing and a feeling of fullness in the ear or feeling that the ear is plugged.  This condition may be diagnosed based on your symptoms, your medical history, and an ear exam.  This condition may be treated by using ear drops to soften the earwax or by having the earwax removed by a health care provider.  Do   not put any objects, including cotton swabs, into your ear. You can clean the opening of your ear canal with a washcloth or facial tissue. This information is not intended to replace advice given to you by your health care provider. Make sure you discuss any questions you have with your health care provider. Document Released: 09/22/2004 Document Revised: 10/26/2016 Document Reviewed: 10/26/2016 Elsevier Interactive Patient Education  2018 [employer]vier Inc.  

## 2017-03-11 LAB — CMP14+EGFR
ALK PHOS: 57 IU/L (ref 39–117)
ALT: 17 IU/L (ref 0–44)
AST: 14 IU/L (ref 0–40)
Albumin/Globulin Ratio: 1.8 (ref 1.2–2.2)
Albumin: 4.2 g/dL (ref 3.5–4.8)
BUN / CREAT RATIO: 18 (ref 10–24)
BUN: 18 mg/dL (ref 8–27)
Bilirubin Total: 0.5 mg/dL (ref 0.0–1.2)
CO2: 25 mmol/L (ref 20–29)
CREATININE: 1 mg/dL (ref 0.76–1.27)
Calcium: 9.1 mg/dL (ref 8.6–10.2)
Chloride: 101 mmol/L (ref 96–106)
GFR calc Af Amer: 88 mL/min/{1.73_m2} (ref >59)
GFR calc non Af Amer: 76 mL/min/{1.73_m2} (ref >59)
GLOBULIN, TOTAL: 2.3 g/dL (ref 1.5–4.5)
GLUCOSE: 90 mg/dL (ref 65–99)
Potassium: 4.4 mmol/L (ref 3.5–5.2)
SODIUM: 142 mmol/L (ref 134–144)
Total Protein: 6.5 g/dL (ref 6.0–8.5)

## 2017-03-11 LAB — LIPID PANEL
CHOL/HDL RATIO: 3.9 ratio (ref 0.0–5.0)
CHOLESTEROL TOTAL: 130 mg/dL (ref 100–199)
HDL: 33 mg/dL — ABNORMAL LOW (ref >39)
LDL CALC: 81 mg/dL (ref 0–99)
Triglycerides: 81 mg/dL (ref 0–149)
VLDL CHOLESTEROL CAL: 16 mg/dL (ref 5–40)

## 2017-05-03 ENCOUNTER — Encounter: Payer: Self-pay | Admitting: *Deleted

## 2017-05-22 DIAGNOSIS — H35451 Secondary pigmentary degeneration, right eye: Secondary | ICD-10-CM | POA: Diagnosis not present

## 2017-05-22 DIAGNOSIS — H35411 Lattice degeneration of retina, right eye: Secondary | ICD-10-CM | POA: Diagnosis not present

## 2017-05-22 DIAGNOSIS — H43813 Vitreous degeneration, bilateral: Secondary | ICD-10-CM | POA: Diagnosis not present

## 2017-05-22 DIAGNOSIS — Z961 Presence of intraocular lens: Secondary | ICD-10-CM | POA: Diagnosis not present

## 2017-05-30 DIAGNOSIS — H43812 Vitreous degeneration, left eye: Secondary | ICD-10-CM | POA: Diagnosis not present

## 2017-05-30 DIAGNOSIS — H35372 Puckering of macula, left eye: Secondary | ICD-10-CM | POA: Diagnosis not present

## 2017-05-30 DIAGNOSIS — H3581 Retinal edema: Secondary | ICD-10-CM | POA: Diagnosis not present

## 2017-05-31 ENCOUNTER — Telehealth: Payer: Self-pay | Admitting: Cardiovascular Disease

## 2017-05-31 ENCOUNTER — Telehealth: Payer: Self-pay | Admitting: Nurse Practitioner

## 2017-05-31 NOTE — Telephone Encounter (Signed)
Pt's wife notified Dr Zackery Barefoot prescribed medication

## 2017-05-31 NOTE — Telephone Encounter (Signed)
Spoke with Dr. Lelon Perla office in regards to Adam Rowland, They are going to fax request to our [facility].  Once we receive and Dr. Purvis Sheffield makes decision Adam Rowland would like a telephone call.,

## 2017-05-31 NOTE — Telephone Encounter (Signed)
[name] walked into the office today in regards to having an upcoming eye procedure.  [name] is going to have His wife call the eye surgeron's office asking them to send Dr. Purvis Sheffield notification of procedure . [phone] patients cell.

## 2017-05-31 NOTE — Telephone Encounter (Signed)
Dr. Octavia Heir (eye doctor)  Office #  [phone]

## 2017-06-02 NOTE — Telephone Encounter (Signed)
Patient notified that this was faxed back yesterday evening to [facility].  Copy faxed to patient at his request as well.  Fax:  [phone]

## 2017-06-06 ENCOUNTER — Encounter: Payer: Self-pay | Admitting: *Deleted

## 2017-06-06 ENCOUNTER — Ambulatory Visit (INDEPENDENT_AMBULATORY_CARE_PROVIDER_SITE_OTHER): Payer: Medicare Other | Admitting: *Deleted

## 2017-06-06 VITALS — BP 138/69 | HR 58 | Ht 68.5 in | Wt 179.0 lb

## 2017-06-06 DIAGNOSIS — R413 Other amnesia: Secondary | ICD-10-CM

## 2017-06-06 DIAGNOSIS — Z0000 Encounter for general adult medical examination without abnormal findings: Secondary | ICD-10-CM | POA: Diagnosis not present

## 2017-06-06 NOTE — Patient Instructions (Signed)
  Mr. Glasson , Thank you for taking time to come for your Medicare Wellness Visit. I appreciate your ongoing commitment to your health goals. Please review the following plan we discussed and let me know if I can assist you in the future.   These are the goals we discussed: Goals    . Exercise 150 minutes per week (moderate activity)       This is a list of the screening recommended for you and due dates:  Health Maintenance  Topic Date Due  . Flu Shot  03/29/2017  . Colon Cancer Screening  11/30/2022  . Tetanus Vaccine  12/13/2022  .  Hepatitis C: One time screening is recommended by Center for Disease Control  (CDC) for  adults born from [birth year] through 1964.   Completed  . Pneumonia vaccines  Completed   I will discuss your memory concerns with Gennette Pac and call you with a plan.  Don't forget your flu shot.  Use a pill box to organize your medications so that you can keep up with whether you've taken them.  Continue to stay physically active daily

## 2017-06-06 NOTE — Progress Notes (Addendum)
Subjective:   Adam Rowland is a 71 y.o. male who presents for a subsequent Medicare Annual Wellness Visit. 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   Review of Systems  Reports that his health is about the same as last year but his wife has some concerns about his memory. Adam Rowland has noticed some difficulty with concentrating and often closes his eyes to help him focus. His wife mentioned this as well and says that he does it frequently and she asks him often if he is asleep. Her concern is that he has always been really comfortable with numbers and worked in Education officer, environmental for 40 years. Over the past year she has noticed an increasing problem with numbers and the ability to keep up with appointments. He also has trouble expressing himself sometimes and often can't think of the words he wants to say. They would like a neurology referral.   Cardiac Risk Factors include: advanced age (>55men, >9 women);dyslipidemia;hypertension;male gender   Other systems negative.     Objective:    BP 138/69 (BP Location: Left Arm, Patient Position: Sitting, Cuff Size: Large)   Pulse (!) 58   Ht 5' 8.5" (1.74 m)   Wt 179 lb (81.2 kg)   BMI 26.82 kg/m    Current Medications (verified) Outpatient Encounter Prescriptions as of 06/06/2017  Medication Sig  . aspirin EC 81 MG EC tablet Take 1 tablet (81 mg total) by mouth daily.  Marland Kitchen atorvastatin (LIPITOR) 40 MG tablet Take 1 tablet (40 mg total) by mouth daily.  Marland Kitchen lisinopril (PRINIVIL,ZESTRIL) 20 MG tablet Take 1 tablet (20 mg total) by mouth daily.  . metoprolol tartrate (LOPRESSOR) 25 MG tablet Take 1 tablet (25 mg total) by mouth 2 (two) times daily.  . nitroGLYCERIN (NITROSTAT) 0.4 MG SL tablet Place 1 tablet (0.4 mg total) under the tongue every 5 (five) minutes x 3 doses as needed for chest pain.  . prasugrel (EFFIENT) 10 MG TABS tablet TAKE 1 TABLET (10 MG TOTAL) BY MOUTH DAILY.   No facility-administered encounter medications on  file as of 06/06/2017.     Allergies (verified) Patient has no known allergies.  History: Past Medical History:  Diagnosis Date  . CAD (coronary artery disease)    a. 05/2014 NSTEMI/PCI: LM nl, LAD 24m (3.0x23 Xience Alpine DES), D1 90, LCX 46m, OM1 80, RCA 30p/d, RPDA 40-50p, EF 40-45% ->Plan for staged PCI of LCX/OM1.  . Cardiomyopathy, ischemic    a. 05/2014 Echo: Ef 45-50%, mid-apicalanteroseptal AK, Gr 2 DD.  Marland Kitchen Cataract   . Fracture, ankle 06/12/2014  . Hyperlipidemia   . Hypertension   . Myocardial infarction [facility]) 2015   non STEMI  . Nephrolithiasis 01/16/2013  . NSVT (nonsustained ventricular tachycardia) (HCC)    a. 05/2014 in setting of NSTEMI -  asymptomatic.   Past Surgical History:  Procedure Laterality Date  . ANKLE FRACTURE SURGERY    . CARDIAC CATHETERIZATION    . CARDIAC CATHETERIZATION Right 06/20/2014   Procedure: CORONARY STENT INTERVENTION;  Surgeon: Peter M Swaziland, MD;  Location: [facility] CATH LAB;  Service: Cardiovascular;  Laterality: Right;  . CATARACT EXTRACTION Bilateral   . CORONARY STENT PLACEMENT    . LEFT HEART CATHETERIZATION WITH CORONARY ANGIOGRAM N/A 06/20/2014   Procedure: LEFT HEART CATHETERIZATION WITH CORONARY ANGIOGRAM;  Surgeon: Peter M Swaziland, MD;  Location: [facility] CATH LAB;  Service: Cardiovascular;  Laterality: N/A;  . NASAL SEPTUM SURGERY    . VASECTOMY  Family History  Problem Relation Age of Onset  . Hypertension Mother   . Cancer Father        bladder ca  . Hyperlipidemia Father   . Alcohol abuse Father   . Hyperlipidemia Sister   . Cancer Brother   . Nephrolithiasis Brother   . Colon cancer Neg Hx   . Stomach cancer Neg Hx   . Esophageal cancer Neg Hx    Social History   Occupational History  . Not on file.   Social History Main Topics  . Smoking status: Former Smoker    Packs/day: 1.00    Years: 23.00    Types: Cigarettes    Start date: 10/26/1964    Quit date: 08/30/1987  . Smokeless tobacco: Former Neurosurgeon    Types:  Chew    Quit date: 02/27/1988     Comment: quit in 1989, smoked since he was a teenager/chewed tobacco for about 6 months after he quit smoking cigarettes  . Alcohol use 4.2 oz/week    7 Glasses of wine per week     Comment: 1 glass of wine nightly   . Drug use: No  . Sexual activity: Yes   Tobacco Counseling No tobacco use  Activities of Daily Living In your present state of health, do you have any difficulty performing the following activities: 06/06/2017 06/06/2017  Hearing? - N  Vision? Y N  Comment left eye is blurry. Having surgery to remove scar tissue from retina next week -  Difficulty concentrating or making decisions? Adam Rowland  Comment difficulty concentrating when watching television Wife mentioned that he is having some problems with remembering appointments and also some expressive aphasia for about 1 year  Walking or climbing stairs? - N  Dressing or bathing? - N  Doing errands, shopping? - N  Quarry manager and eating ? - N  Using the Toilet? - N  In the past six months, have you accidently leaked urine? - N  Do you have problems with loss of bowel control? - N  Managing your Medications? - N  Managing your Finances? - N  Housekeeping or managing your Housekeeping? - N  Some recent data might be hidden    Immunizations and Health Maintenance Immunization History  Administered Date(s) Administered  . Influenza Split 10/08/2012, 07/09/2013  . Influenza, High Dose Seasonal PF 06/02/2016  . Influenza,inj,Quad PF,6+ Mos 06/21/2014, 05/27/2015  . Pneumococcal Conjugate-13 02/16/2015  . Pneumococcal Polysaccharide-23 12/12/2012  . Tdap 12/12/2012  . Zoster 10/08/2012   Health Maintenance Due  Topic Date Due  . INFLUENZA VACCINE  03/29/2017    Patient Care Team: Bennie Pierini, FNP as PCP - General (Nurse Practitioner) Tora Duck, PA-C as Consulting Physician (Physician Assistant) Laqueta Linden, MD as Consulting Physician (Cardiology) Toni Arthurs, MD as Consulting Physician (Orthopedic Surgery) Derryl Harbor, OD as Consulting Physician (Optometry) Stephannie Li, MD as Consulting Physician (Ophthalmology)  No hospitalizations, ER visits, or surgeries this past year.  He does have upcoming eye surgery to remove scar tissue from the retina.    Assessment:   This is a routine wellness examination for Adam Rowland.   Hearing/Vision screen No deficits noted during visit today. Last eye exam was last week. He is scheduled for eye surgery next week.   Dietary issues and exercise activities discussed: Current Exercise Habits: Home exercise routine, Type of exercise: Other - see comments (exercise bike), Time (Minutes): 30, Frequency (Times/Week): 7, Weekly Exercise (Minutes/Week): 210, Intensity: Moderate, Exercise limited by:  None identified  Goals    . Exercise 150 minutes per week (moderate activity)       Depression Screen PHQ 2/9 Scores 06/06/2017 03/10/2017 12/01/2016 07/01/2016  PHQ - 2 Score 0 0 0 0    Fall Risk Fall Risk  06/06/2017 03/10/2017 12/01/2016 07/01/2016 06/02/2016  Falls in the past year? No No No No No  Number falls in past yr: - - - - -  Injury with Fall? - - - - -  Comment - - - - -    Cognitive Function: MMSE - Mini Mental State Exam 06/06/2017 03/10/2017 06/02/2016  Orientation to time Orientation to time comments said 2008 instead of 2018. His wife looked at him and he said "it is 2008, isn't it?" - -  Orientation to Place Registration Attention/ Calculation Attention/Calculation-comments took considerable effort to spell "world" backwards - -  Recall 1 2 -  Language- name 2 objects Language- repeat Language- follow 3 step command Language- read & follow direction Language-read & follow direction-comments I had to ask him to follow the directions of the sentence several times before it registered that he needed to close his eyes - -  Write a sentence Copy design 0 1 1  Total score 24 27 -  I believe there is reason for concern based on these results and coupled with his wife's concerns      Screening Tests Health Maintenance  Topic Date Due  . INFLUENZA VACCINE  03/29/2017  . COLONOSCOPY  11/30/2022  . TETANUS/TDAP  12/13/2022  . Hepatitis C Screening  Completed  . PNA vac Low Risk Adult  Completed   Will update flu shot at next visit. They did not have time today.      Plan:  Flu shot at next visit.  Use a pillbox to organize daily medications so that you don't forget to take them. Keep routine f/u with Bennie Pierini, FNP and cardiologist Continue to stay physically active daily. Aim for at least 150 min of moderate activity weekly. I will discuss their memory concerns with Adam Rowland and call them with a plan.   I have personally reviewed and noted the following in the patient's chart:   . Medical and social history . Use of alcohol, tobacco or illicit drugs  . Current medications and supplements . Functional ability and status . Nutritional status . Physical activity . Advanced directives-He has a living will and health care power of attorney . List of other physicians . Hospitalizations, surgeries, and ER visits in previous 12 months . Vitals . Screenings to include cognitive, depression, and falls . Referrals and appointments  In addition, I have reviewed and discussed with patient certain preventive protocols, quality metrics, and best practice recommendations. A written personalized care plan for preventive services as well as general preventive health recommendations were provided to patient.     Demetrios Loll, RN  06/06/2017   I have reviewed and agree with the above AWV documentation.   [name] Daphine Deutscher, FNP

## 2017-06-08 DIAGNOSIS — H33022 Retinal detachment with multiple breaks, left eye: Secondary | ICD-10-CM | POA: Diagnosis not present

## 2017-06-08 DIAGNOSIS — H33012 Retinal detachment with single break, left eye: Secondary | ICD-10-CM | POA: Diagnosis not present

## 2017-06-09 ENCOUNTER — Ambulatory Visit: Payer: Medicare Other | Admitting: Cardiovascular Disease

## 2017-06-09 ENCOUNTER — Encounter: Payer: Self-pay | Admitting: Cardiovascular Disease

## 2017-06-09 DIAGNOSIS — H35372 Puckering of macula, left eye: Secondary | ICD-10-CM | POA: Diagnosis not present

## 2017-06-09 DIAGNOSIS — H33312 Horseshoe tear of retina without detachment, left eye: Secondary | ICD-10-CM | POA: Diagnosis not present

## 2017-06-14 ENCOUNTER — Ambulatory Visit (INDEPENDENT_AMBULATORY_CARE_PROVIDER_SITE_OTHER): Payer: Medicare Other | Admitting: Cardiovascular Disease

## 2017-06-14 ENCOUNTER — Encounter: Payer: Self-pay | Admitting: Cardiovascular Disease

## 2017-06-14 VITALS — BP 122/78 | HR 72 | Ht 69.0 in | Wt 182.0 lb

## 2017-06-14 DIAGNOSIS — Z955 Presence of coronary angioplasty implant and graft: Secondary | ICD-10-CM | POA: Diagnosis not present

## 2017-06-14 DIAGNOSIS — I10 Essential (primary) hypertension: Secondary | ICD-10-CM | POA: Diagnosis not present

## 2017-06-14 DIAGNOSIS — I255 Ischemic cardiomyopathy: Secondary | ICD-10-CM | POA: Diagnosis not present

## 2017-06-14 DIAGNOSIS — I252 Old myocardial infarction: Secondary | ICD-10-CM

## 2017-06-14 DIAGNOSIS — I519 Heart disease, unspecified: Secondary | ICD-10-CM | POA: Diagnosis not present

## 2017-06-14 DIAGNOSIS — E782 Mixed hyperlipidemia: Secondary | ICD-10-CM

## 2017-06-14 DIAGNOSIS — I25118 Atherosclerotic heart disease of native coronary artery with other forms of angina pectoris: Secondary | ICD-10-CM

## 2017-06-14 NOTE — Patient Instructions (Signed)

## 2017-06-14 NOTE — Progress Notes (Signed)
SUBJECTIVE: [name] returns for routine cardiovascular follow-up. He has a history of a non-STEMI in 05/2014 and underwent coronary artery stent placement to the LAD. He has significant residual disease of the left circumflex and first obtuse marginal, and it was felt that a staged approach to additional intervention would be appropriate once he fully recovered from his myocardial infarction. He has an ischemic cardiomyopathy, EF 45-50%. He also has hypertension and hyperlipidemia.  The patient denies any symptoms of chest pain, palpitations, shortness of breath, lightheadedness, dizziness, leg swelling, orthopnea, PND, and syncope.  He recently underwent retinal surgery for a tear.  He exercises on a recumbent bicycle at home for 30 minutes almost daily.   Review of Systems: As per "subjective", otherwise negative.  No Known Allergies  Current Outpatient Prescriptions  Medication Sig Dispense Refill  . aspirin EC 81 MG EC tablet Take 1 tablet (81 mg total) by mouth daily.    Marland Kitchen. atorvastatin (LIPITOR) 40 MG tablet Take 1 tablet (40 mg total) by mouth daily. 30 tablet 5  . lisinopril (PRINIVIL,ZESTRIL) 20 MG tablet Take 1 tablet (20 mg total) by mouth daily. 30 tablet 6  . metoprolol tartrate (LOPRESSOR) 25 MG tablet Take 1 tablet (25 mg total) by mouth 2 (two) times daily. 180 tablet 1  . nitroGLYCERIN (NITROSTAT) 0.4 MG SL tablet Place 1 tablet (0.4 mg total) under the tongue every 5 (five) minutes x 3 doses as needed for chest pain. 25 tablet 3  . prasugrel (EFFIENT) 10 MG TABS tablet TAKE 1 TABLET (10 MG TOTAL) BY MOUTH DAILY. 90 tablet 1   No current facility-administered medications for this visit.     Past Medical History:  Diagnosis Date  . CAD (coronary artery disease)    a. 05/2014 NSTEMI/PCI: LM nl, LAD 541m (3.0x23 Xience Alpine DES), D1 90, LCX 1792m, OM1 80, RCA 30p/d, RPDA 40-50p, EF 40-45% ->Plan for staged PCI of LCX/OM1.  . Cardiomyopathy, ischemic    a. 05/2014  Echo: Ef 45-50%, mid-apicalanteroseptal AK, Gr 2 DD.  Marland Kitchen. Cataract   . Fracture, ankle 06/12/2014  . Hyperlipidemia   . Hypertension   . Myocardial infarction [facility](HCC) 2015   non STEMI  . Nephrolithiasis 01/16/2013  . NSVT (nonsustained ventricular tachycardia) (HCC)    a. 05/2014 in setting of NSTEMI -  asymptomatic.    Past Surgical History:  Procedure Laterality Date  . ANKLE FRACTURE SURGERY    . CARDIAC CATHETERIZATION    . CARDIAC CATHETERIZATION Right 06/20/2014   Procedure: CORONARY STENT INTERVENTION;  Surgeon: Peter M SwazilandJordan, MD;  [facility];  Service: Cardiovascular;  Laterality: Right;  . CATARACT EXTRACTION Bilateral   . CORONARY STENT PLACEMENT    . LEFT HEART CATHETERIZATION WITH CORONARY ANGIOGRAM N/A 06/20/2014   Procedure: LEFT HEART CATHETERIZATION WITH CORONARY ANGIOGRAM;  Surgeon: Peter M SwazilandJordan, MD;  [facility];  Service: Cardiovascular;  Laterality: N/A;  . NASAL SEPTUM SURGERY    . VASECTOMY      Social History   Social History  . Marital status: Married    Spouse name: N/A  . Number of children: N/A  . Years of education: N/A   Occupational History  . Not on file.   Social History Main Topics  . Smoking status: Former Smoker    Packs/day: 1.00    Years: 23.00    Types: Cigarettes    Start date: 10/26/1964    Quit date: 08/30/1987  . Smokeless tobacco: Former  User    Types: Dorna Bloom    Quit date: 02/27/1988     Comment: quit in 1989, smoked since he was a teenager/chewed tobacco for about 6 months after he quit smoking cigarettes  . Alcohol use 4.2 oz/week    7 Glasses of wine per week     Comment: 1 glass of wine nightly   . Drug use: No  . Sexual activity: Yes   Other Topics Concern  . Not on file   Social History Narrative  . No narrative on file     Vitals:   06/14/17 1503  BP: 122/78  Pulse: 72  SpO2: 98%  Weight: 182 lb (82.6 kg)  Height: 5\' 9"  (1.753 m)    Wt Readings from Last 3 Encounters:  06/14/17 182 lb  (82.6 kg)  06/06/17 179 lb (81.2 kg)  03/10/17 189 lb (85.7 kg)     PHYSICAL EXAM General: NAD HEENT: Normal. Neck: No JVD, no thyromegaly. Lungs: Clear to auscultation bilaterally with normal respiratory effort. CV: Nondisplaced PMI.  Regular rate and rhythm, normal S1/S2, no S3/S4, no murmur. No pretibial or periankle edema.  No carotid bruit.   Abdomen: Soft, nontender, no distention.  Neurologic: Alert and oriented.  Psych: Normal affect. Skin: Normal. Musculoskeletal: No gross deformities.    ECG: Most recent ECG reviewed.   Labs: Lab Results  Component Value Date/Time   K 4.4 03/10/2017 04:59 PM   BUN 18 03/10/2017 04:59 PM   CREATININE 1.00 03/10/2017 04:59 PM   CREATININE 0.86 01/16/2013 08:45 AM   ALT 17 03/10/2017 04:59 PM   TSH 1.740 01/16/2014 10:54 AM   HGB 15.5 06/02/2016 08:24 AM     Lipids: Lab Results  Component Value Date/Time   LDLCALC 81 03/10/2017 04:59 PM   LDLCALC 83 01/16/2014 10:54 AM   LDLCALC 105 (H) 01/16/2013 08:45 AM   CHOL 130 03/10/2017 04:59 PM   CHOL 171 01/16/2013 08:45 AM   TRIG 81 03/10/2017 04:59 PM   TRIG 134 10/23/2014 09:56 AM   TRIG 176 (H) 01/16/2013 08:45 AM   HDL 33 (L) 03/10/2017 04:59 PM   HDL 42 10/23/2014 09:56 AM   HDL 31 (L) 01/16/2013 08:45 AM       ASSESSMENT AND PLAN:  1. CAD s/p LAD stent for NSTEMI: Symptomatically stable. Continue aspirin, Lipitor, lisinopril, metoprolol, and prasugrel. He may eventually require a staged intervention of the left circumflex coronary artery and first obtuse marginal branch if optimal medical therapy fails. However, he has been doing very well for some time now. No changes to therapy.  2. Essential HTN: Controlled on lisinopril 20 mg daily. No changes.  3. Hyperlipidemia: Lipids checked on 03/10/17 with LDL 81. Continue Lipitor 40 mg daily.  4. Ischemic cardiomyopathy: Symptomatically stable with no evidence of heart failure. No changes to therapy which includes an ACE  inhibitor and beta blocker.     Disposition: Follow up 6 months.   Prentice Docker, M.D., F.A.C.C.

## 2017-06-16 DIAGNOSIS — H35372 Puckering of macula, left eye: Secondary | ICD-10-CM | POA: Diagnosis not present

## 2017-06-16 DIAGNOSIS — H33012 Retinal detachment with single break, left eye: Secondary | ICD-10-CM | POA: Diagnosis not present

## 2017-06-19 DIAGNOSIS — H33012 Retinal detachment with single break, left eye: Secondary | ICD-10-CM | POA: Diagnosis not present

## 2017-06-20 DIAGNOSIS — H33012 Retinal detachment with single break, left eye: Secondary | ICD-10-CM | POA: Diagnosis not present

## 2017-07-05 ENCOUNTER — Ambulatory Visit (INDEPENDENT_AMBULATORY_CARE_PROVIDER_SITE_OTHER): Payer: Medicare Other | Admitting: *Deleted

## 2017-07-05 DIAGNOSIS — Z23 Encounter for immunization: Secondary | ICD-10-CM

## 2017-07-13 ENCOUNTER — Other Ambulatory Visit: Payer: Self-pay | Admitting: Nurse Practitioner

## 2017-07-13 DIAGNOSIS — I2510 Atherosclerotic heart disease of native coronary artery without angina pectoris: Secondary | ICD-10-CM

## 2017-07-14 ENCOUNTER — Other Ambulatory Visit: Payer: Self-pay | Admitting: Nurse Practitioner

## 2017-07-14 DIAGNOSIS — I2510 Atherosclerotic heart disease of native coronary artery without angina pectoris: Secondary | ICD-10-CM

## 2017-08-14 ENCOUNTER — Ambulatory Visit: Payer: Medicare Other | Admitting: Neurology

## 2017-08-14 ENCOUNTER — Encounter: Payer: Self-pay | Admitting: Psychology

## 2017-08-14 ENCOUNTER — Encounter: Payer: Self-pay | Admitting: Neurology

## 2017-08-14 DIAGNOSIS — R413 Other amnesia: Secondary | ICD-10-CM

## 2017-08-14 DIAGNOSIS — R4789 Other speech disturbances: Secondary | ICD-10-CM | POA: Diagnosis not present

## 2017-08-14 NOTE — Patient Instructions (Addendum)
You have complaints of memory loss: memory loss or changes in cognitive function can have many reasons and does not always mean you have dementia. Conditions that can contribute to subjective or objective memory loss include: depression, stress, poor sleep from insomnia or sleep apnea, dehydration, fluctuation in blood sugar values, thyroid or electrolyte dysfunction and certain vitamin deficiencies. Dementia can be caused by stroke, brain atherosclerosis or brain vascular disease due to vascular risk factors (smoking, high blood pressure, high cholesterol, obesity and uncontrolled diabetes), certain degenerative brain disorders (including Parkinson's disease and Multiple sclerosis) and by Alzheimer's disease or other, more rare and sometimes hereditary causes. We will do some additional testing: blood work, and we will do a brain scan. We will not start medication as yet. We will also request a formal cognitive test called neuropsychological evaluation which is done by a licensed neuropsychologist. We will make a referral in that regard. We will call you with brain scan test results and monitor your symptoms. Your memory loss is rather mild at this point, which, of course is reassuring.

## 2017-08-14 NOTE — Progress Notes (Signed)
Subjective:    Patient ID: Adam Rowland is a 71 y.o. male.  HPI     Adam Rowland [facility] [address] [address] [place], [place] [address]  Dear Adam Rowland,   I saw your patient, Adam Rowland, upon your kind request in my neurologic clinic today for initial consultation of his memory loss. The patient is accompanied by his wife and GD today. As you know, [name] is a 71 year old right-handed gentleman with an underlying medical history of coronary artery disease with history of non-STEMI, history of ischemic cardiomyopathy, hyperlipidemia, hypertension, kidney stones, nonsustained V. tach, and overweight state, who has had short term memory issues for the past 3-4 years, maybe even 5 years. He has noticed trouble focusing and also difficulty with numbers. 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 He has had some word finding difficulties.  Symptoms are not necessarily very progressive per patient. I reviewed your office note from 06/06/2017.  He is retired and lives with his wife. They have one child. He quit smoking in 1989, drinks alcohol in the form of wine, usually 1 glass per day, caffeine in the form of coffee, 3 cups daily on average.  He has had trouble multitasking, sequencing, per wife.  He snores, but no apneas are noted. He has no FHx of dementia or AD. MGM lived to be 110, mother is 100. Father passed away at age 30 from bladder cancer. Patient has an older half sister who is in her early 2s. He has never had a brain MRI. He has never had one-sided weakness or numbness or slurring of speech. He does not always drink enough water he admits. His driving has been okay per patient and his wife.  His Past Medical History Is Significant For: Past Medical History:  Diagnosis Date  . CAD (coronary artery disease)    a. 05/2014 NSTEMI/PCI: LM nl, LAD 101m (3.0x23 Xience Alpine DES), D1 90, LCX 48m, OM1 80, RCA  30p/d, RPDA 40-50p, EF 40-45% ->Plan for staged PCI of LCX/OM1.  . Cardiomyopathy, ischemic    a. 05/2014 Echo: Ef 45-50%, mid-apicalanteroseptal AK, Gr 2 DD.  Marland Kitchen Cataract   . Fracture, ankle 06/12/2014  . Hyperlipidemia   . Hypertension   . Myocardial infarction [facility]) 2015   non STEMI  . Nephrolithiasis 01/16/2013  . NSVT (nonsustained ventricular tachycardia) (HCC)    a. 05/2014 in setting of NSTEMI -  asymptomatic.    His Past Surgical History Is Significant For: Past Surgical History:  Procedure Laterality Date  . ANKLE FRACTURE SURGERY    . CARDIAC CATHETERIZATION    . CARDIAC CATHETERIZATION Right 06/20/2014   Procedure: CORONARY STENT INTERVENTION;  Surgeon: Peter M Swaziland, MD;  Location: [facility] CATH LAB;  Service: Cardiovascular;  Laterality: Right;  . CATARACT EXTRACTION Bilateral   . CORONARY STENT PLACEMENT    . LEFT HEART CATHETERIZATION WITH CORONARY ANGIOGRAM N/A 06/20/2014   Procedure: LEFT HEART CATHETERIZATION WITH CORONARY ANGIOGRAM;  Surgeon: Peter M Swaziland, MD;  Location: [facility] CATH LAB;  Service: Cardiovascular;  Laterality: N/A;  . NASAL SEPTUM SURGERY    . VASECTOMY      His Family History Is Significant For: Family History  Problem Relation Age of Onset  . Hypertension Mother   . Cancer Father        bladder ca  . Hyperlipidemia Father   . Alcohol abuse Father   . Hyperlipidemia Sister   .  Cancer Brother   . Nephrolithiasis Brother   . Colon cancer Neg Hx   . Stomach cancer Neg Hx   . Esophageal cancer Neg Hx     His Social History Is Significant For: Social History   Socioeconomic History  . Marital status: Married    Spouse name: Not on file  . Number of children: Not on file  . Years of education: Not on file  . Highest education level: Not on file  Social Needs  . Financial resource strain: Not on file  . Food insecurity - worry: Not on file  . Food insecurity - inability: Not on file  . Transportation needs - medical: Not on file  .  Transportation needs - non-medical: Not on file  Occupational History  . Not on file  Tobacco Use  . Smoking status: Former Smoker    Packs/day: 1.00    Years: 23.00    Pack years: 23.00    Types: Cigarettes    Start date: 10/26/1964    Last attempt to quit: 08/30/1987    Years since quitting: 29.9  . Smokeless tobacco: Former NeurosurgeonUser    Types: Chew    Quit date: 02/27/1988  . Tobacco comment: quit in 1989, smoked since he was a teenager/chewed tobacco for about 6 months after he quit smoking cigarettes  Substance and Sexual Activity  . Alcohol use: Yes    Alcohol/week: 4.2 oz    Types: 7 Glasses of wine per week    Comment: 1 glass of wine nightly   . Drug use: No  . Sexual activity: Yes  Other Topics Concern  . Not on file  Social History Narrative  . Not on file    His Allergies Are:  No Known Allergies:   His Current Medications Are:  Outpatient Encounter Medications as of 08/14/2017  Medication Sig  . aspirin EC 81 MG EC tablet Take 1 tablet (81 mg total) by mouth daily.  Marland Kitchen. atorvastatin (LIPITOR) 40 MG tablet Take 1 tablet (40 mg total) by mouth daily.  Marland Kitchen. lisinopril (PRINIVIL,ZESTRIL) 20 MG tablet Take 1 tablet (20 mg total) by mouth daily.  . metoprolol tartrate (LOPRESSOR) 25 MG tablet Take 1 tablet (25 mg total) by mouth 2 (two) times daily.  . nitroGLYCERIN (NITROSTAT) 0.4 MG SL tablet Place 1 tablet (0.4 mg total) under the tongue every 5 (five) minutes x 3 doses as needed for chest pain.  . prasugrel (EFFIENT) 10 MG TABS tablet TAKE 1 TABLET (10 MG TOTAL) BY MOUTH DAILY.   No facility-administered encounter medications on file as of 08/14/2017.   :   Review of Systems:  Out of a complete 14 point review of systems, all are reviewed and negative with the exception of these symptoms as listed below:  Review of Systems  Neurological:       Pt presents today to discuss his memory.    Objective:  Neurological Exam  Physical Exam Physical Examination:   There  were no vitals filed for this visit.  General Examination: The patient is a very pleasant 71 y.o. male in no acute distress. He appears well-developed and well-nourished and well groomed.   HEENT: Normocephalic, atraumatic, pupils are equal, round and reactive to light and accommodation. He wears corrective eyeglasses. He is status post bilateral cataract repairs and had recent retinal surgery on the left. Extraocular tracking is good without limitation to gaze excursion or nystagmus noted. Normal smooth pursuit is noted. Hearing is grossly intact. Face is symmetric  with normal facial animation and normal facial sensation. Speech is clear with no dysarthria noted. There is no hypophonia. There is no lip, neck/head, jaw or voice tremor. Neck is supple with full range of passive and active motion. There are no carotid bruits on auscultation. Oropharynx exam reveals: moderate mouth dryness, adequate dental hygiene and no significant airway crowding.   Chest: Clear to auscultation without wheezing, rhonchi or crackles noted.  Heart: S1+S2+0, regular and normal without murmurs, rubs or gallops noted.   Abdomen: Soft, non-tender and non-distended with normal bowel sounds appreciated on auscultation.  Extremities: There is no pitting edema in the distal lower extremities bilaterally. Pedal pulses are intact.  Skin: Warm and dry without trophic changes noted.  Musculoskeletal: exam reveals no obvious joint deformities, tenderness or joint swelling or erythema, except left ankle wider, hardware in place.   Neurologically:  Mental status: The patient is awake, alert and oriented in all 4 spheres. His immediate and remote memory, attention, language skills and fund of knowledge are fairly appropriate. There is no evidence of aphasia, agnosia, apraxia or anomia. Speech is clear with normal prosody and enunciation. Thought process is linear. Mood is normal and affect is normal.   On 08/14/2017: MMSE: 27/30,  CDT: 3/4, AFT: 5/min.   Cranial nerves II - XII are as described above under HEENT exam. In addition: shoulder shrug is normal with equal shoulder height noted. Motor exam: Normal bulk, strength and tone is noted. There is no drift, tremor or rebound. Romberg is negative. Reflexes are 2+ throughout. Fine motor skills and coordination: intact with normal finger taps, normal hand movements, normal rapid alternating patting, normal foot taps and normal foot agility.  Cerebellar testing: No dysmetria or intention tremor on finger to nose testing. Heel to shin is unremarkable bilaterally. There is no truncal or gait ataxia.  Sensory exam: intact to light touch in the upper and lower extremities.  Gait, station and balance: He stands easily. No veering to one side is noted. No leaning to one side is noted. Posture is age-appropriate and stance is narrow based. Gait shows normal stride length and normal pace. No problems turning are noted.               Assessment and Plan:   In summary, BAILEE THALL is a very pleasant 71 y.o.-year old male with an underlying medical history of coronary artery disease with history of non-STEMI, history of ischemic cardiomyopathy, hyperlipidemia, hypertension, kidney stones, nonsustained V. tach, and overweight state, who  presents for consultation of his memory loss. He reports a 3-4 year history of forgetfulness, difficulty sequencing, difficulty multitasking, difficulty with word finding and with the numbers. Thankfully, he does not have a telltale family history of Alzheimer's disease with dementia in general. His memory scores of mildly abnormal, he certainly does have vascular risk factors. I did not suggest we proceed with any medications quite yet but we did talk about potentially utilizing memory medications down the road. I would like to proceed with blood work, brain MRI without contrast and formal neuropsychological testing. He is agreeable. We will do a follow-up  routinely after these tests are completed. I had a long chat with the patient and his family about my findings and the diagnosis of memory loss and dementia, its prognosis and treatment options. We talked about medical treatments and non-pharmacological approaches. We talked about maintaining a healthy lifestyle in general and staying active mentally and physically. I encouraged the patient to eat healthy, exercise  daily and keep well hydrated, to keep a scheduled bedtime and wake time routine, to not skip any meals and eat healthy snacks in between meals and to have protein with every meal. I stressed the importance of regular exercise, within of course the patient's own mobility limitations. I encouraged the patient to keep up with current events by reading the news paper or watching the news and to do word puzzles, or if feasible, to go on [email or web address].   I answered all their questions today and the patient and his wife and GD were in agreement with the above outlined plan.  Thank you very much for allowing me to participate in the care of this nice patient. If I can be of any further assistance to you please do not hesitate to call me at [phone].  Sincerely,   Adam Rowland

## 2017-08-15 ENCOUNTER — Telehealth: Payer: Self-pay

## 2017-08-15 LAB — TSH: TSH: 2.32 u[IU]/mL (ref 0.450–4.500)

## 2017-08-15 LAB — B12 AND FOLATE PANEL
FOLATE: 19.3 ng/mL (ref >3.0)
Vitamin B-12: 348 pg/mL (ref 232–1245)

## 2017-08-15 LAB — RPR: RPR Ser Ql: NONREACTIVE

## 2017-08-15 LAB — HGB A1C W/O EAG: HEMOGLOBIN A1C: 5.9 % — AB (ref 4.8–5.6)

## 2017-08-15 NOTE — Telephone Encounter (Signed)
-----   Message from Huston Foley, MD sent at 08/15/2017  3:02 PM EST ----- Diabetes marker in the pre-diabetes range, otherwise, labs are good, no further action required, pls notify pt.  Huston Foley, MD, PhD [facility])

## 2017-08-15 NOTE — Telephone Encounter (Signed)
I called pt, spoke to pt's wife, we do not have a DPR on file yet for this pt, therefore, I am unable to speak with her. Pt's wife reports that pt will be home after 5pm today. I will try pt again after 5 pm.

## 2017-08-15 NOTE — Telephone Encounter (Addendum)
I called pt. I advised him that his labs revealed his diabetes marker was in the pre-diabetes range, otherwise, his labs are good. Pt verbalized understanding of results.

## 2017-08-15 NOTE — Progress Notes (Signed)
Diabetes marker in the pre-diabetes range, otherwise, labs are good, no further action required, pls notify pt.  Huston Foley, MD, PhD [facility])

## 2017-08-18 ENCOUNTER — Ambulatory Visit
Admission: RE | Admit: 2017-08-18 | Discharge: 2017-08-18 | Disposition: A | Discharge status: Home / Self Care | Payer: Medicare Other | Source: Ambulatory Visit | Attending: Neurology | Admitting: Neurology

## 2017-08-18 DIAGNOSIS — R413 Other amnesia: Secondary | ICD-10-CM | POA: Diagnosis not present

## 2017-08-18 DIAGNOSIS — R4789 Other speech disturbances: Secondary | ICD-10-CM

## 2017-08-25 ENCOUNTER — Telehealth: Payer: Self-pay

## 2017-08-25 NOTE — Telephone Encounter (Signed)
-----   Message from Geronimo Running, RN sent at 08/24/2017  5:19 PM EST -----   ----- Message ----- From: Melvyn Novas, MD Sent: 08/24/2017   2:04 PM To: [name] Daphine Deutscher, FNP, Huston Foley, MD, #  Age related small vessel changes, mild atrophy- generalized, not lobar.  Co-incidential sinusitis finding . No stroke, tumor or scar tissue.  Normal for age.

## 2017-08-25 NOTE — Telephone Encounter (Signed)
I left a detailed message with mri results, ok per dpr. I advised patient to call back with any questions or concerns.   

## 2017-09-20 DIAGNOSIS — H524 Presbyopia: Secondary | ICD-10-CM | POA: Diagnosis not present

## 2017-10-02 ENCOUNTER — Other Ambulatory Visit: Payer: Self-pay | Admitting: Cardiovascular Disease

## 2017-10-04 DIAGNOSIS — L821 Other seborrheic keratosis: Secondary | ICD-10-CM | POA: Diagnosis not present

## 2017-10-04 DIAGNOSIS — L814 Other melanin hyperpigmentation: Secondary | ICD-10-CM | POA: Diagnosis not present

## 2017-10-04 DIAGNOSIS — D225 Melanocytic nevi of trunk: Secondary | ICD-10-CM | POA: Diagnosis not present

## 2017-10-04 DIAGNOSIS — C44329 Squamous cell carcinoma of skin of other parts of face: Secondary | ICD-10-CM | POA: Diagnosis not present

## 2017-10-04 DIAGNOSIS — L570 Actinic keratosis: Secondary | ICD-10-CM | POA: Diagnosis not present

## 2017-10-31 DIAGNOSIS — L570 Actinic keratosis: Secondary | ICD-10-CM | POA: Diagnosis not present

## 2017-10-31 DIAGNOSIS — C44329 Squamous cell carcinoma of skin of other parts of face: Secondary | ICD-10-CM | POA: Diagnosis not present

## 2017-11-02 ENCOUNTER — Other Ambulatory Visit: Payer: Self-pay | Admitting: Nurse Practitioner

## 2017-11-02 DIAGNOSIS — I2510 Atherosclerotic heart disease of native coronary artery without angina pectoris: Secondary | ICD-10-CM

## 2017-11-02 NOTE — Telephone Encounter (Signed)
Last refill without being seen 

## 2017-11-17 ENCOUNTER — Other Ambulatory Visit: Payer: Self-pay | Admitting: Nurse Practitioner

## 2017-11-17 DIAGNOSIS — I214 Non-ST elevation (NSTEMI) myocardial infarction: Secondary | ICD-10-CM

## 2017-11-17 NOTE — Telephone Encounter (Signed)
Last refill without being seen 

## 2017-12-08 DIAGNOSIS — S82872D Displaced pilon fracture of left tibia, subsequent encounter for closed fracture with routine healing: Secondary | ICD-10-CM | POA: Diagnosis not present

## 2017-12-11 ENCOUNTER — Encounter: Payer: Medicare Other | Admitting: Psychology

## 2017-12-21 ENCOUNTER — Ambulatory Visit (INDEPENDENT_AMBULATORY_CARE_PROVIDER_SITE_OTHER): Payer: Medicare Other

## 2017-12-21 ENCOUNTER — Encounter: Payer: Self-pay | Admitting: Podiatry

## 2017-12-21 ENCOUNTER — Ambulatory Visit: Payer: Medicare Other | Admitting: Podiatry

## 2017-12-21 DIAGNOSIS — M2011 Hallux valgus (acquired), right foot: Secondary | ICD-10-CM

## 2017-12-21 DIAGNOSIS — M2012 Hallux valgus (acquired), left foot: Secondary | ICD-10-CM

## 2017-12-21 DIAGNOSIS — M7752 Other enthesopathy of left foot: Secondary | ICD-10-CM

## 2017-12-21 DIAGNOSIS — M779 Enthesopathy, unspecified: Secondary | ICD-10-CM

## 2017-12-21 DIAGNOSIS — L84 Corns and callosities: Secondary | ICD-10-CM

## 2017-12-21 MED ORDER — TRIAMCINOLONE ACETONIDE 10 MG/ML IJ SUSP
10.0000 mg | Freq: Once | INTRAMUSCULAR | Status: AC
  Administered 2017-12-21: 10 mg

## 2017-12-21 NOTE — Progress Notes (Signed)
Subjective:   Patient ID: Adam Rowland, male   DOB: 72 y.o.   MRN: [id number]   HPI Patient presents with a lot of pain in the left forefoot and states it has been hurting for about a year and a half and is tried to trim in the past with minimal relief of symptoms.  Patient does not currently smoke and likes to be active   Review of Systems  All other systems reviewed and are negative.       Objective:  Physical Exam  Constitutional: He appears well-developed and well-nourished.  Cardiovascular: Intact distal pulses.  Pulmonary/Chest: Effort normal.  Musculoskeletal: Normal range of motion.  Neurological: He is alert.  Skin: Skin is warm.  Nursing note and vitals reviewed.   Neurovascular status intact muscle strength is adequate range of motion within normal limits with patient noted to have quite a bit of inflammation pain of the fifth MPJ left with fluid buildup and severe keratotic lesion formation is very painful when palpated.  Patient was noted to have good digital perfusion and is well oriented x3     Assessment:  Inflammatory capsulitis fifth MPJ left with severe keratotic lesion formation     Plan:  H&P condition reviewed and today careful plantar capsule injection administered 2 mg Dexasone Kenalog 5 mg Xylocaine and then to deep debridement of lesion with sterile sharp instrumentation with no iatrogenic bleeding applied padding discussed possibility for fifth metatarsal head resection at one point in the future  X-ray indicates there is enlargement of the head of fifth metatarsal left with no other advanced pathology

## 2017-12-21 NOTE — Patient Instructions (Signed)

## 2017-12-21 NOTE — Progress Notes (Signed)
   Subjective:    Patient ID: Adam Rowland, male    DOB: 1946/03/05, 72 y.o.   MRN: [id number]  HPI     Review of Systems  All other systems reviewed and are negative.      Objective:   Physical Exam        Assessment & Plan:

## 2017-12-27 ENCOUNTER — Other Ambulatory Visit: Payer: Self-pay | Admitting: Nurse Practitioner

## 2017-12-27 DIAGNOSIS — E782 Mixed hyperlipidemia: Secondary | ICD-10-CM

## 2017-12-27 NOTE — Telephone Encounter (Signed)
Last lipid 03/10/17

## 2017-12-28 NOTE — Telephone Encounter (Signed)
Last refill without being seen 

## 2018-01-01 ENCOUNTER — Ambulatory Visit (INDEPENDENT_AMBULATORY_CARE_PROVIDER_SITE_OTHER): Payer: Medicare Other | Admitting: Psychology

## 2018-01-01 ENCOUNTER — Encounter: Payer: Self-pay | Admitting: Psychology

## 2018-01-01 ENCOUNTER — Ambulatory Visit: Payer: Medicare Other | Admitting: Psychology

## 2018-01-01 DIAGNOSIS — R413 Other amnesia: Secondary | ICD-10-CM

## 2018-01-01 NOTE — Progress Notes (Signed)
   Neuropsychology Note  [name] completed 60 minutes of neuropsychological testing with technician, Wallace Keller, BS, under the supervision of Dr. Elvis Coil, Licensed Psychologist. The patient did not appear overtly distressed by the testing session, per behavioral observation or via self-report to the technician. Rest breaks were offered.   Clinical Decision Making: In considering the patient's current level of functioning, level of presumed impairment, nature of symptoms, emotional and behavioral responses during the interview, level of literacy, and observed level of motivation/effort, a battery of tests was selected and communicated to the psychometrician.  Communication between the psychologist and technician was ongoing throughout the testing session and changes were made as deemed necessary based on patient performance on testing, technician observations and additional pertinent factors such as those listed above.  Adam Rowland will return within approximately 2 weeks for an interactive feedback session with Dr. Alinda Dooms at which time his test performances, clinical impressions and treatment recommendations will be reviewed in detail. The patient understands he can contact our office should he require our assistance before this time.  35 minutes spent performing neuropsychological evaluation services/clinical decision making (psychologist). [CPT 96132] 60 minutes spent face-to-face with patient administering standardized tests, 30 minutes spent scoring (technician). [CPT [id number], 96139]  Full report to follow.
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
## 2018-01-02 ENCOUNTER — Other Ambulatory Visit: Payer: Self-pay | Admitting: Cardiovascular Disease

## 2018-01-02 DIAGNOSIS — L905 Scar conditions and fibrosis of skin: Secondary | ICD-10-CM | POA: Diagnosis not present

## 2018-01-02 DIAGNOSIS — L570 Actinic keratosis: Secondary | ICD-10-CM | POA: Diagnosis not present

## 2018-01-02 DIAGNOSIS — Z85828 Personal history of other malignant neoplasm of skin: Secondary | ICD-10-CM | POA: Diagnosis not present

## 2018-01-04 ENCOUNTER — Ambulatory Visit: Payer: Medicare Other | Admitting: Nurse Practitioner

## 2018-01-04 ENCOUNTER — Encounter: Payer: Self-pay | Admitting: Nurse Practitioner

## 2018-01-04 VITALS — BP 131/70 | HR 76 | Temp 97.7°F | Ht 69.0 in | Wt 185.0 lb

## 2018-01-04 DIAGNOSIS — E8881 Metabolic syndrome: Secondary | ICD-10-CM | POA: Diagnosis not present

## 2018-01-04 DIAGNOSIS — I2510 Atherosclerotic heart disease of native coronary artery without angina pectoris: Secondary | ICD-10-CM | POA: Diagnosis not present

## 2018-01-04 DIAGNOSIS — I214 Non-ST elevation (NSTEMI) myocardial infarction: Secondary | ICD-10-CM

## 2018-01-04 DIAGNOSIS — E782 Mixed hyperlipidemia: Secondary | ICD-10-CM | POA: Diagnosis not present

## 2018-01-04 DIAGNOSIS — Z6827 Body mass index (BMI) 27.0-27.9, adult: Secondary | ICD-10-CM

## 2018-01-04 DIAGNOSIS — I10 Essential (primary) hypertension: Secondary | ICD-10-CM

## 2018-01-04 MED ORDER — METOPROLOL TARTRATE 25 MG PO TABS: ORAL_TABLET | ORAL | 0 refills | Status: DC

## 2018-01-04 MED ORDER — PRASUGREL HCL 10 MG PO TABS: 10.0000 mg | ORAL_TABLET | Freq: Every day | ORAL | 1 refills | Status: DC

## 2018-01-04 MED ORDER — LISINOPRIL 20 MG PO TABS: 20.0000 mg | ORAL_TABLET | Freq: Every day | ORAL | 6 refills | Status: DC

## 2018-01-04 MED ORDER — ATORVASTATIN CALCIUM 40 MG PO TABS
ORAL_TABLET | ORAL | 1 refills | Status: DC
Start: 2018-01-04 — End: 2018-07-23

## 2018-01-04 NOTE — Progress Notes (Signed)
Subjective:    Patient ID: Adam Rowland, male    DOB: 08/12/1946, 72 y.o.   MRN: [id number]   Chief Complaint: Medical Management of Chronic Issues   HPI:  1. Essential hypertension  No c/o chest pain, sob or headache. Does not check blood pressure at home. BP Readings from Last 3 Encounters:  01/04/18 131/70  06/14/17 122/78  06/06/17 138/69     2. Mixed hyperlipidemia  Trying to avoid fats. Rides stationary bike 15-20 minutes a day  3. Metabolic syndrome  Occasional blood sugar elevation. Last HGBA1c was 5.6%. He does not check blood suagrs at home.  4. BMI 27.0-27.9,adult  No recent weight changes  5. Coronary artery disease involving native coronary artery of native heart without angina pectoris . Saw cardiollogy back in ovember 2018. No changes were made to plan of care at that time.    Outpatient Encounter Medications as of 01/04/2018  Medication Sig  . aspirin EC 81 MG EC tablet Take 1 tablet (81 mg total) by mouth daily.  Marland Kitchen atorvastatin (LIPITOR) 40 MG tablet TAKE 1 TABLET ONCE DAILY AT BEDTIME  . lisinopril (PRINIVIL,ZESTRIL) 20 MG tablet Take 1 tablet (20 mg total) by mouth daily.  . metoprolol tartrate (LOPRESSOR) 25 MG tablet TAKE  (1)  TABLET TWICE A DAY.  . nitroGLYCERIN (NITROSTAT) 0.4 MG SL tablet Place 1 tablet (0.4 mg total) under the tongue every 5 (five) minutes x 3 doses as needed for chest pain.  . prasugrel (EFFIENT) 10 MG TABS tablet TAKE 1 TABLET (10 MG TOTAL) BY MOUTH DAILY.       New complaints: Has ben seeing neurology for memory loss. Was last seen the first part of this week and they ran some tests. He is to go back in 2 weeks for test results. He denies any problems with his memory or cognitive ability.  Social history: Is retired and lives with his wife.    Review of Systems  Constitutional: Negative for activity change and appetite change.  HENT: Negative.   Eyes: Negative for pain.  Respiratory: Negative for shortness of breath.     Cardiovascular: Negative for chest pain, palpitations and leg swelling.  Gastrointestinal: Negative for abdominal pain.  Endocrine: Negative for polydipsia.  Genitourinary: Negative.   Skin: Negative for rash.  Neurological: Negative for dizziness, weakness and headaches.  Hematological: Does not bruise/bleed easily.  Psychiatric/Behavioral: Negative.   All other systems reviewed and are negative.      Objective:   Physical Exam  Constitutional: He is oriented to person, place, and time.  HENT:  Head: Normocephalic.  Nose: Nose normal.  Mouth/Throat: Oropharynx is clear and moist.  Eyes: Pupils are equal, round, and reactive to light. EOM are normal.  Neck: Normal range of motion and phonation normal. Neck supple. No JVD present. Carotid bruit is not present. No thyroid mass and no thyromegaly present.  Cardiovascular: Normal rate and regular rhythm.  Pulmonary/Chest: Effort normal and breath sounds normal. No respiratory distress.  Abdominal: Soft. Normal appearance, normal aorta and bowel sounds are normal. There is no tenderness.  Musculoskeletal: Normal range of motion.  Lymphadenopathy:    He has no cervical adenopathy.  Neurological: He is alert and oriented to person, place, and time.  Skin: Skin is warm and dry.  Psychiatric: He has a normal mood and affect. His behavior is normal. Judgment and thought content normal.   BP 131/70   Pulse 76   Temp 97.7 F (36.5 C) (Oral)  Ht 5' 9"  (1.753 m)   Wt 185 lb (83.9 kg)   BMI 27.32 kg/m         Assessment & Plan:  MAAN ZARCONE comes in today with chief complaint of Medical Management of Chronic Issues   Diagnosis and orders addressed:  1. Essential hypertension Low sodium diet - CMP14+EGFR - lisinopril (PRINIVIL,ZESTRIL) 20 MG tablet; Take 1 tablet (20 mg total) by mouth daily.  Dispense: 30 tablet; Refill: 6  2. Mixed hyperlipidemia Low fat diet - Lipid panel - atorvastatin (LIPITOR) 40 MG tablet; TAKE 1  TABLET ONCE DAILY AT BEDTIME  Dispense: 90 tablet; Refill: 1  3. Metabolic syndrome Watch carbs in diet  4. BMI 27.0-27.9,adult Discussed diet and exercise for person with BMI >25 Will recheck weight in 3-6 months  5. Coronary artery disease involving native coronary artery of native heart without angina pectoris keepp every 6 month follow up with cardiology - metoprolol tartrate (LOPRESSOR) 25 MG tablet; TAKE  (1)  TABLET TWICE A DAY.  Dispense: 180 tablet; Refill: 0  6. NSTEMI (non-ST elevated myocardial infarction) (HCC) - prasugrel (EFFIENT) 10 MG TABS tablet; Take 1 tablet (10 mg total) by mouth daily.  Dispense: 90 tablet; Refill: 1   Labs pending Health Maintenance reviewed Diet and exercise encouraged  Follow up plan: 3 months   [name] Hassell Done, FNP
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
## 2018-01-08 ENCOUNTER — Encounter: Payer: Self-pay | Admitting: Psychology

## 2018-01-08 ENCOUNTER — Ambulatory Visit (INDEPENDENT_AMBULATORY_CARE_PROVIDER_SITE_OTHER): Payer: Medicare Other | Admitting: Psychology

## 2018-01-08 DIAGNOSIS — F0280 Dementia in other diseases classified elsewhere without behavioral disturbance: Secondary | ICD-10-CM

## 2018-01-08 DIAGNOSIS — G301 Alzheimer's disease with late onset: Secondary | ICD-10-CM

## 2018-02-13 ENCOUNTER — Other Ambulatory Visit: Payer: Self-pay | Admitting: Nurse Practitioner

## 2018-02-13 ENCOUNTER — Encounter: Payer: Self-pay | Admitting: Neurology

## 2018-02-13 ENCOUNTER — Ambulatory Visit: Payer: Medicare Other | Admitting: Neurology

## 2018-02-13 VITALS — BP 122/64 | HR 63 | Ht 69.0 in | Wt 180.0 lb

## 2018-02-13 DIAGNOSIS — F0150 Vascular dementia without behavioral disturbance: Secondary | ICD-10-CM

## 2018-02-13 DIAGNOSIS — F0280 Dementia in other diseases classified elsewhere without behavioral disturbance: Secondary | ICD-10-CM | POA: Diagnosis not present

## 2018-02-13 DIAGNOSIS — G309 Alzheimer's disease, unspecified: Secondary | ICD-10-CM

## 2018-02-13 DIAGNOSIS — I214 Non-ST elevation (NSTEMI) myocardial infarction: Secondary | ICD-10-CM

## 2018-02-13 MED ORDER — DONEPEZIL HCL 10 MG PO TABS: 10.0000 mg | ORAL_TABLET | Freq: Every day | ORAL | 5 refills | Status: DC

## 2018-02-13 NOTE — Patient Instructions (Addendum)
Your brain MRI showed fairly age appropriate findings, blood work was benign too.  For your memory, we will start Aricept (generic name: donepezil) 10 mg: take 1/2 pill each evening for 2 weeks, then take 1 pill daily in the evening thereafter. Common side effects may include dry eyes, dry mouth, confusion, low pulse, low blood pressure, also GI related side effects (nausea, vomiting, diarrhea, constipation), headaches; rare side effects may include hallucinations and seizures.   I am worried about your driving, please have your family monitor it and I would suggest at this point only local roads, familiar routes, no nighttime and no highway driving, no long-distance.

## 2018-02-13 NOTE — Progress Notes (Signed)
Subjective:    Patient ID: Adam Rowland is a 72 y.o. male.  HPI     Interim history:   Adam Rowland is a 72 year old right-handed gentleman with an underlying medical history of coronary artery disease with history of non-STEMI, history of ischemic cardiomyopathy, hyperlipidemia, hypertension, kidney stones, nonsustained V. tach, and overweight state, who presents for follow-up consultation is.  Patient is accompanied by his wife and daughter again today. I first met him on 08/14/2017 at the request of his primary care nurse practitioner, at which time he reported short-term memory loss for the past 3-4 years, maybe 5 years. I suggested further workup in the form of neuropsychological evaluation and blood work as well as brain scan. His blood work was unremarkable with the exception of A1c in the prediabetes range. We checked thyroid function with TSH and B12 level as well. RPR was negative. He had a brain MRI with contrast on 08/18/2017 and I reviewed the results: IMPRESSION:  Slightly abnormal MRI scan of the brain showing mild degree of generalized cerebral atrophy. 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   He was called with the results.   He had interim neuropsychological evaluation on 01/01/2018 as well as a feedback appointment on 01/08/18 with Dr. Bonita Quin and I reviewed the results: << Clinical Impressions: Mild dementia, unspecified, without behavioral disturbance (rule out mixed vascular dementia and Alzheimer's disease).  Results of cognitive testing were abnormal and demonstrated significant impairment in several domains of cognitive function. Additionally, there is evidence that his cognitive deficits are interfering with his ability to perform complex tasks such as managing appointments and finances. As such, diagnostic criteria for a dementia syndrome are met.  The patient's cognitive  profile demonstrates both cortical and subcortical features. Consistent with cortical dysfunction, he demonstrated memory consolidation dysfunction and mildly impaired confrontation naming. Consistent with subcortical dysfunction, he demonstrated slowed processing speed, executive dysfunction and impaired verbal fluency. This may represent a mixed dementia. It is certainly possible that Alzheimer's disease is present, but I also wonder about vascular contribution based on cardiac history and cognitive profile. Surprisingly, brain MRI report did not mention any small vessel disease or remote infarcts.  Overall, based on current level of functioning and test scores, I would characterize his dementia as mild stage. There is no evidence of behavioral disturbance. There also is no evidence of depression, anxiety or other primary psychiatric disorder.     Recommendations/Plan: Based on the findings of the present evaluation, the following recommendations are offered:   1. I recommend that if he is going to continue driving that his driving be limited to familiar locations within close geographic distance during daylight hours (no long distance driving, no driving by himself to new areas/places). 2. His wife is already managing the finances and appointments and reminding him to take his medications; this should be continued. 3. His family will likely benefit from education and support regarding mild dementia. They are referred to the [employer]. I provided them with a packet of written information as well. 4. He appears to be an appropriate candidate for cholinesterase inhibitor therapy, and he can follow up with Dr. Rexene Alberts about this. 5. Neuropsychological re-evaluation in 1-2 years could be considered in order to monitor cognitive status, track any progression of symptoms and further assist with treatment planning. >>  Today, 02/13/2018: He reports feeling about the same. His wife and daughter  have noted a  decline particularly in areas of numbers, directions, multitasking. He seems to keep his eyes closed more often even though he is not a sleep. Even when he watches TV or when he is at work which is not high stress, he sometimes keeps his eyes closed. He reports that he is listening. He works with his son once a week or so and helps him with paperwork. He does not have any critical function at work. 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 His wife is concerned that he should not continue with that role. His driving is very limited at this point. He does stay active during the day and tries to hydrate well. He mows the lawn but also mows the lawn at his mom's place but has to pull the lawnmower in a trailer behind him which is a concern to his daughter. I did agree that he should not be driving with a trailer behind him.  The patient's allergies, current medications, family history, past medical history, past social history, past surgical history and problem list were reviewed and updated as appropriate.   Previously:  08/14/2017: (He) has had short term memory issues for the past 3-4 years, maybe even 5 years. He has noticed trouble focusing and also difficulty with numbers. 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 He has had some word finding difficulties.  Symptoms are not necessarily very progressive per patient. I reviewed your office note from 06/06/2017.  He is retired and lives with his wife. They have one child. He quit smoking in 1989, drinks alcohol in the form of wine, usually 1 glass per day, caffeine in the form of coffee, 3 cups daily on average.  He has had trouble multitasking, sequencing, per wife.  He snores, but no apneas are noted. He has no FHx of dementia or AD. MGM lived to be 39, mother is 44. Father passed away at age 40 from bladder cancer. Patient has an older half sister who is in her early 41s. He has never had a  brain MRI. He has never had one-sided weakness or numbness or slurring of speech. He does not always drink enough water he admits. His driving has been okay per patient and his wife.  His Past Medical History Is Significant For: Past Medical History:  Diagnosis Date  . CAD (coronary artery disease)    a. 05/2014 NSTEMI/PCI: LM nl, LAD 71m(3.0x23 Xience Alpine DES), D1 90, LCX 891mOM1 80, RCA 30p/d, RPDA 40-50p, EF 40-45% ->Plan for staged PCI of LCX/OM1.  . Cardiomyopathy, ischemic    a. 05/2014 Echo: Ef 45-50%, mid-apicalanteroseptal AK, Gr 2 DD.  . Marland Kitchenataract   . Fracture, ankle 06/12/2014  . Hyperlipidemia   . Hypertension   . [facility]   non [facility]  . Nephrolithiasis 01/16/2013  . NSVT (nonsustained ventricular tachycardia) ([facility]   a. 05/2014 in setting of NSTEMI -  asymptomatic.    His Past Surgical History Is Significant For: Past Surgical History:  Procedure Laterality Date  . ANKLE FRACTURE SURGERY    . CARDIAC CATHETERIZATION    . CARDIAC CATHETERIZATION Right 06/20/2014   Procedure: CORONARY STENT INTERVENTION;  Surgeon: Peter M JoMartiniqueMD;  [facility];  Service: Cardiovascular;  Laterality: Right;  . CATARACT EXTRACTION Bilateral   . CORONARY STENT PLACEMENT    . LEFT HEART CATHETERIZATION WITH CORONARY ANGIOGRAM N/A 06/20/2014   Procedure: LEFT HEART CATHETERIZATION  WITH CORONARY ANGIOGRAM;  Surgeon: Peter M Martinique, MD;  Location: [facility] CATH LAB;  Service: Cardiovascular;  Laterality: N/A;  . NASAL SEPTUM SURGERY    . VASECTOMY      His Family History Is Significant For: Family History  Problem Relation Age of Onset  . Hypertension Mother   . Cancer Father        bladder ca  . Hyperlipidemia Father   . Alcohol abuse Father   . Hyperlipidemia Sister   . Cancer Brother   . Nephrolithiasis Brother   . Colon cancer Neg Hx   . Stomach cancer Neg Hx   . Esophageal cancer Neg Hx     His Social History Is Significant For: Social  History   Socioeconomic History  . Marital status: Married    Spouse name: Not on file  . Number of children: Not on file  . Years of education: Not on file  . Highest education level: Not on file  Occupational History  . Not on file  Social Needs  . Financial resource strain: Not on file  . Food insecurity:    Worry: Not on file    Inability: Not on file  . Transportation needs:    Medical: Not on file    Non-medical: Not on file  Tobacco Use  . Smoking status: Former Smoker    Packs/day: 1.00    Years: 23.00    Pack years: 23.00    Types: Cigarettes    Start date: 10/26/1964    Last attempt to quit: 08/30/1987    Years since quitting: 30.4  . Smokeless tobacco: Former Systems developer    Types: Chew    Quit date: 02/27/1988  . Tobacco comment: quit in 1989, smoked since he was a teenager/chewed tobacco for about 6 months after he quit smoking cigarettes  Substance and Sexual Activity  . Alcohol use: Yes    Alcohol/week: 4.2 oz    Types: 7 Glasses of wine per week    Comment: 1 glass of wine nightly   . Drug use: No  . Sexual activity: Yes  Lifestyle  . Physical activity:    Days per week: Not on file    Minutes per session: Not on file  . Stress: Not on file  Relationships  . Social connections:    Talks on phone: Not on file    Gets together: Not on file    Attends religious service: Not on file    Active member of club or organization: Not on file    Attends meetings of clubs or organizations: Not on file    Relationship status: Not on file  Other Topics Concern  . Not on file  Social History Narrative  . Not on file    His Allergies Are:  No Known Allergies:   His Current Medications Are:  Outpatient Encounter Medications as of 02/13/2018  Medication Sig  . aspirin EC 81 MG EC tablet Take 1 tablet (81 mg total) by mouth daily.  Marland Kitchen atorvastatin (LIPITOR) 40 MG tablet TAKE 1 TABLET ONCE DAILY AT BEDTIME  . lisinopril (PRINIVIL,ZESTRIL) 20 MG tablet Take 1 tablet (20  mg total) by mouth daily.  . metoprolol tartrate (LOPRESSOR) 25 MG tablet TAKE  (1)  TABLET TWICE A DAY.  . nitroGLYCERIN (NITROSTAT) 0.4 MG SL tablet Place 1 tablet (0.4 mg total) under the tongue every 5 (five) minutes x 3 doses as needed for chest pain.  . prasugrel (EFFIENT) 10 MG TABS tablet Take 1  tablet (10 mg total) by mouth daily.   No facility-administered encounter medications on file as of 02/13/2018.   :  Review of Systems:  Out of a complete 14 point review of systems, all are reviewed and negative with the exception of these symptoms as listed below: Review of Systems  Neurological:       MMSE 21/30, Animals: 5    Objective:  Neurological Exam  Physical Exam Physical Examination:   Vitals:   02/13/18 0811  BP: 122/64  Pulse: 63    General Examination: The patient is a very pleasant 73 y.o. male in no acute distress. He appears well-developed and well-nourished and well groomed. Not very talkative, wife provides most hx and questions, daughter also supplements with info and questions.  HEENT: Normocephalic, atraumatic, pupils are equal, round and reactive to light and accommodation. He wears corrective eyeglasses. He is status post bilateral cataract repairs (and s/p retinal surgery on the left). Extraocular tracking is good without limitation to gaze excursion or nystagmus noted. Normal smooth pursuit is noted. Hearing is grossly intact. Face is symmetric with normal facial animation and normal facial sensation. Speech is clear with no dysarthria noted. There is no hypophonia. There is no lip, neck/head, jaw or voice tremor. Neck is supple with full range of passive and active motion. Oropharynx exam reveals: mild mouth dryness, adequate dental hygiene and no significant airway crowding.   Chest: Clear to auscultation without wheezing, rhonchi or crackles noted.  Heart: S1+S2+0, regular and normal without murmurs, rubs or gallops noted.   Abdomen: Soft, non-tender  and non-distended with normal bowel sounds appreciated on auscultation.  Extremities: There is no pitting edema in the distal lower extremities bilaterally.   Skin: Warm and dry without trophic changes noted.  Musculoskeletal: exam reveals no obvious joint deformities, tenderness or joint swelling or erythema, except left ankle wider, hardware in place.   Neurologically:  Mental status: The patient is awake, alert and oriented in all 4 spheres. His immediate and remote memory, attention, language skills and fund of knowledge are fairly appropriate. There is no evidence of aphasia, agnosia, apraxia or anomia. Speech is clear with normal prosody and enunciation. Thought process is linear. Mood is normal and affect is normal.   On 08/14/2017: MMSE: 27/30, CDT: 3/4, AFT: 5/min.   On 02/13/2018: MMSE: 21/30, CDT: 1/4, AFT: 5/min.  Cranial nerves II - XII are as described above under HEENT exam. In addition: shoulder shrug is normal with equal shoulder height noted. Motor exam: Normal bulk, strength and tone is noted. There is no drift, tremor or rebound. Romberg is negative. Reflexes are 1-2+ throughout, absent in the L ankle. Fine motor skills and coordination: intact with normal finger taps, normal hand movements, normal rapid alternating patting, normal foot taps and normal foot agility.  Cerebellar testing: No dysmetria or intention tremor. There is no truncal or gait ataxia.  Sensory exam: intact to light touch in the upper and lower extremities.  Gait, station and balance: He stands easily. No veering to one side is noted. No leaning to one side is noted. Posture is age-appropriate and stance is narrow based. Gait shows normal stride length and normal pace. No problems turning are noted.               Assessment and Plan:   In summary, Adam Rowland is a very pleasant 72 year old male with an underlying medical history of coronary artery disease with history of non-STEMI, history of  ischemic cardiomyopathy,  hyperlipidemia, hypertension, kidney stones, nonsustained V. tach, and overweight state, who presents for follow up consultation of his memory loss. He reported a 3-4 year history of forgetfulness, difficulty sequencing, difficulty multitasking, difficulty with word finding and with numbers when we first met in Dec 2018. He has had interim w/u in the form of blood work, which was benign, MRI brain wo contrast in 07/2017, which showed fairly age-appropriate findings and recent neuropsych testing, which is supportive of a mixed dementia Dx. He does not have a telltale family history of Alzheimer's disease or dementia in general. His memory scores have declined. He certainly does have vascular risk factors. I suggested we proceed with dementia medication at this time in the form of generic Aricept, 10 mg strength, 1/2 pill qHS for 2 weeks, then 1 pill qHS thereafter. I would for him to return in 3-4 months to see one of our NPs. I had a lengthy discussion today with the patient and his family about the diagnosis of memory loss/dementia, its prognosis and treatment options. We talked about the different medication options and also dual medication options down the road, healthy lifestyle and talked about resources for support. We talked about his driving and he is advised to monitor his driving skills with the help of his family and limits himself as far as driving goes. We talked about expectations and limitations as well as side effects of donepezil at length today. He is advised to stay active mentally and physically, pursue a healthy lifestyle, good nutrition, good hydration, sent bedtime and rise time routine. He is advised to stay active socially as well. 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  I answered all their questions today and the patient and his family were in agreement with the plan. I spent 40 minutes in total face-to-face time with the  patient, more than 50% of which was spent in counseling and coordination of care, reviewing test results, reviewing medication and discussing or reviewing the diagnosis of dementia, its prognosis and treatment options. Pertinent laboratory and imaging test results that were available during this visit with the patient were reviewed by me and considered in my medical decision making (see chart for details).

## 2018-06-04 NOTE — Progress Notes (Addendum)
[facility]  PATIENT: Adam Rowland DOB: 1946-01-01   REASON FOR VISIT: Follow-up for dementia HISTORY FROM: Patient wife and grand daughter    HISTORY OF PRESENT ILLNESS: Adam Rowland is a 72 year old right-handed gentleman with an underlying medical history of coronary artery disease with history of non-STEMI, history of ischemic cardiomyopathy, hyperlipidemia, hypertension, kidney stones, nonsustained V. tach, and overweight state, who presents for follow-up consultation is.  Patient is accompanied by his wife and daughter again today. I first met him on 08/14/2017 at the request of his primary care nurse practitioner, at which time he reported short-term memory loss for the past 3-4 years, maybe 5 years. I suggested further workup in the form of neuropsychological evaluation and blood work as well as brain scan. His blood work was unremarkable with the exception of A1c in the prediabetes range. We checked thyroid function with TSH and B12 level as well. RPR was negative. He had a brain MRI with contrast on 08/18/2017 and I reviewed the results: IMPRESSION: Slightly abnormal MRI scan of the brain showing mild degree of generalized cerebral atrophy. 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  He was called with the results.   He had interim neuropsychological evaluation on 01/01/2018 as well as a feedback appointment on 01/08/18 with Dr. Bonita Quin and I reviewed the results: << Clinical Impressions:Mild dementia, unspecified, without behavioral disturbance (rule out mixed vascular dementia and Alzheimer's disease).  Results of cognitive testing were abnormal and demonstrated significant impairment in several domains of cognitive function. Additionally, there is evidence that his cognitive deficits are interfering with his ability to perform complex tasks such as managing appointments  and finances. As such, diagnostic criteria for a dementia syndrome are met.  The patient's cognitive profile demonstrates both cortical and subcortical features. Consistent with cortical dysfunction, he demonstrated memory consolidation dysfunction and mildly impaired confrontation naming. Consistent with subcortical dysfunction, he demonstrated slowed processing speed, executive dysfunction and impaired verbal fluency. This may represent a mixed dementia. It is certainly possible that Alzheimer's disease is present, but I also wonder about vascular contribution based on cardiac history and cognitive profile. Surprisingly, brain MRI report did not mention any small vessel disease or remote infarcts.  Overall, based on current level of functioning and test scores, I would characterize his dementia as mild stage. There is no evidence of behavioral disturbance. There also is no evidence of depression, anxiety or other primary psychiatric disorder. Today, 02/13/2018: He reports feeling about the same. His wife and daughter have noted a decline particularly in areas of numbers, directions, multitasking. He seems to keep his eyes closed more often even though he is not a sleep. Even when he watches TV or when he is at work which is not high stress, he sometimes keeps his eyes closed. He reports that he is listening. He works with his son once a week or so and helps him with paperwork. He does not have any critical function at work. 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 His wife is concerned that he should not continue with that role. His driving is very limited at this point. He does stay active during the day and tries to hydrate well. He mows the lawn but also mows the lawn at his mom's place but has to pull the lawnmower in a trailer behind him which is a concern to his daughter. I did  agree that he should not be driving with a trailer behind him. UPDATE 10/9/2019CM Adam Rowland, 72 year old  male returns for follow-up with a history of dementia.  Wife reports that he does not initiate conversations but participates in conversations.  He consists continues to work at his [employer].  No safety issues identified.  He was placed on Aricept at his last visit currently on 10 mg daily without side effects.  Appetite is good he is sleeping well.  He remains independent in activities of daily living.  He continues to mow the grass.  Wife thinks he has improved since being on the medication.  No falls no balance issues.  He returns for reevaluation  REVIEW OF SYSTEMS: Full 14 system review of systems performed and notable only for those listed, all others are neg:  Constitutional: neg  Cardiovascular: neg Ear/Nose/Throat: neg  Skin: neg Eyes: neg Respiratory: neg Gastroitestinal: neg  Hematology/Lymphatic: neg  Endocrine: neg Musculoskeletal:neg Allergy/Immunology: neg Neurological: Memory loss Psychiatric: neg Sleep : neg   ALLERGIES: No Known Allergies  HOME MEDICATIONS: Outpatient Medications Prior to Visit  Medication Sig Dispense Refill  . aspirin EC 81 MG EC tablet Take 1 tablet (81 mg total) by mouth daily.    Marland Kitchen atorvastatin (LIPITOR) 40 MG tablet TAKE 1 TABLET ONCE DAILY AT BEDTIME 90 tablet 1  . donepezil (ARICEPT) 10 MG tablet Take 1 tablet (10 mg total) by mouth at bedtime. 1/2 pill daily for 2 weeks, then take 1 pill daily thereafter. 30 tablet 5  . lisinopril (PRINIVIL,ZESTRIL) 20 MG tablet Take 1 tablet (20 mg total) by mouth daily. 30 tablet 6  . metoprolol tartrate (LOPRESSOR) 25 MG tablet TAKE  (1)  TABLET TWICE A DAY. 180 tablet 0  . nitroGLYCERIN (NITROSTAT) 0.4 MG SL tablet Place 1 tablet (0.4 mg total) under the tongue every 5 (five) minutes x 3 doses as needed for chest pain. 25 tablet 3  . prasugrel (EFFIENT) 10 MG TABS tablet Take 1 tablet (10 mg total) by mouth daily. 90 tablet 1  . prasugrel (EFFIENT) 10 MG TABS tablet TAKE 1 TABLET BY MOUTH EVERY DAY  90 tablet 0   No facility-administered medications prior to visit.     PAST MEDICAL HISTORY: Past Medical History:  Diagnosis Date  . CAD (coronary artery disease)    a. 05/2014 NSTEMI/PCI: LM nl, LAD 103m(3.0x23 Xience Alpine DES), D1 90, LCX 889mOM1 80, RCA 30p/d, RPDA 40-50p, EF 40-45% ->Plan for staged PCI of LCX/OM1.  . Cardiomyopathy, ischemic    a. 05/2014 Echo: Ef 45-50%, mid-apicalanteroseptal AK, Gr 2 DD.  . Marland Kitchenataract   . Fracture, ankle 06/12/2014  . Hyperlipidemia   . Hypertension   . [facility]   non [facility]  . Nephrolithiasis 01/16/2013  . NSVT (nonsustained ventricular tachycardia) ([facility]   a. 05/2014 in setting of NSTEMI -  asymptomatic.    PAST SURGICAL HISTORY: Past Surgical History:  Procedure Laterality Date  . ANKLE FRACTURE SURGERY    . CARDIAC CATHETERIZATION    . CARDIAC CATHETERIZATION Right 06/20/2014   Procedure: CORONARY STENT INTERVENTION;  Surgeon: Peter M JoMartiniqueMD;  [facility];  Service: Cardiovascular;  Laterality: Right;  . CATARACT EXTRACTION Bilateral   . CORONARY STENT PLACEMENT    . LEFT HEART CATHETERIZATION WITH CORONARY ANGIOGRAM N/A 06/20/2014   Procedure: LEFT HEART CATHETERIZATION WITH CORONARY ANGIOGRAM;  Surgeon: Peter M JoMartiniqueMD;  [facility];  Service: Cardiovascular;  Laterality: N/A;  .  NASAL SEPTUM SURGERY    . VASECTOMY      FAMILY HISTORY: Family History  Problem Relation Age of Onset  . Hypertension Mother   . Cancer Father        bladder ca  . Hyperlipidemia Father   . Alcohol abuse Father   . Hyperlipidemia Sister   . Cancer Brother   . Nephrolithiasis Brother   . Colon cancer Neg Hx   . Stomach cancer Neg Hx   . Esophageal cancer Neg Hx     SOCIAL HISTORY: Social History   Socioeconomic History  . Marital status: Married    Spouse name: Not on file  . Number of children: Not on file  . Years of education: Not on file  . Highest education level: Not on file    Occupational History  . Not on file  Social Needs  . Financial resource strain: Not on file  . Food insecurity:    Worry: Not on file    Inability: Not on file  . Transportation needs:    Medical: Not on file    Non-medical: Not on file  Tobacco Use  . Smoking status: Former Smoker    Packs/day: 1.00    Years: 23.00    Pack years: 23.00    Types: Cigarettes    Start date: 10/26/1964    Last attempt to quit: 08/30/1987    Years since quitting: 30.7  . Smokeless tobacco: Former Systems developer    Types: Chew    Quit date: 02/27/1988  . Tobacco comment: quit in 1989, smoked since he was a teenager/chewed tobacco for about 6 months after he quit smoking cigarettes  Substance and Sexual Activity  . Alcohol use: Yes    Alcohol/week: 7.0 standard drinks    Types: 7 Glasses of wine per week    Comment: 1 glass of wine nightly   . Drug use: No  . Sexual activity: Yes  Lifestyle  . Physical activity:    Days per week: Not on file    Minutes per session: Not on file  . Stress: Not on file  Relationships  . Social connections:    Talks on phone: Not on file    Gets together: Not on file    Attends religious service: Not on file    Active member of club or organization: Not on file    Attends meetings of clubs or organizations: Not on file    Relationship status: Not on file  . Intimate partner violence:    Fear of current or ex partner: Not on file    Emotionally abused: Not on file    Physically abused: Not on file    Forced sexual activity: Not on file  Other Topics Concern  . Not on file  Social History Narrative  . Not on file     PHYSICAL EXAM  Vitals:   06/06/18 0830  BP: 129/66  Pulse: (!) 57  Weight: 180 lb 9.6 oz (81.9 kg)  Height: 5' 8"  (1.727 m)   Body mass index is 27.46 kg/m.  Generalized: Well developed, in no acute distress  Head: normocephalic and atraumatic,. Oropharynx benign  Neck: Supple,  Musculoskeletal: No deformity   Neurological examination    Mentation: Alert oriented to time, place, wife provides history. Attention span and concentration appropriate. Recent and remote memory intact.  Follows all commands speech and language fluent.  On12/17/2018: MMSE: 27/30, CDT: 3/4, AFT: 5/min. On 02/13/2018: MMSE: 21/30, CDT: 1/4, AFT: 5/min On 06/06/2018:  MMSE 26/30 , CDT 1/4 , AFT 5/min Cranial nerve II-XII: Pupils were equal round reactive to light extraocular movements were full, visual field were full on confrontational test. Facial sensation and strength were normal. hearing was intact to finger rubbing bilaterally. Uvula tongue midline. head turning and shoulder shrug were normal and symmetric.Tongue protrusion into cheek strength was normal. Motor: normal bulk and tone, full strength in the BUE, BLE, Sensory: normal and symmetric to light touch,  Coordination: finger-nose-finger, heel-to-shin bilaterally, no dysmetria Reflexes: Brachioradialis 2/2, biceps 2/2, triceps 2/2, patellar 2/2, Achilles 2/2, plantar responses were flexor bilaterally. Gait and Station: Rising up from seated position without assistance, normal stance,  moderate stride, good arm swing, smooth turning, able to perform tiptoe, and heel walking without difficulty. Tandem gait is steady  DIAGNOSTIC DATA (LABS, IMAGING, TESTING) - I reviewed patient records, labs, notes, testing and imaging myself where available.  Lab Results  Component Value Date   WBC 6.4 06/02/2016   HGB 15.5 06/02/2016   HCT 44.1 06/02/2016   MCV 96 06/02/2016   PLT 250 06/02/2016      Component Value Date/Time   NA 142 03/10/2017 1659   K 4.4 03/10/2017 1659   CL 101 03/10/2017 1659   CO2 25 03/10/2017 1659   GLUCOSE 90 03/10/2017 1659   GLUCOSE 132 (H) 06/21/2014 0259   BUN 18 03/10/2017 1659   CREATININE 1.00 03/10/2017 1659   CREATININE 0.86 01/16/2013 0845   CALCIUM 9.1 03/10/2017 1659   PROT 6.5 03/10/2017 1659   ALBUMIN 4.2 03/10/2017 1659   AST 14 03/10/2017 1659   ALT  17 03/10/2017 1659   ALKPHOS 57 03/10/2017 1659   BILITOT 0.5 03/10/2017 1659   GFRNONAA 76 03/10/2017 1659   GFRNONAA >89 01/16/2013 0845   GFRAA 88 03/10/2017 1659   GFRAA >89 01/16/2013 0845   Lab Results  Component Value Date   CHOL 130 03/10/2017   HDL 33 (L) 03/10/2017   LDLCALC 81 03/10/2017   TRIG 81 03/10/2017   CHOLHDL 3.9 03/10/2017   Lab Results  Component Value Date   HGBA1C 5.9 (H) 08/14/2017   Lab Results  Component Value Date   VITAMINB12 348 08/14/2017   Lab Results  Component Value Date   TSH 2.320 08/14/2017      ASSESSMENT AND PLAN JAKOBE BLAU a very pleasant 62 year oldmalewith an underlying medical history of coronary artery disease with history of non-STEMI, history of ischemic cardiomyopathy, hyperlipidemia, hypertension, kidney stones, nonsustained V. tach, and overweight state, whopresents for follow up consultation of his memory loss. He reported a 3-4 year history of forgetfulness, difficulty sequencing, difficulty multitasking, difficulty with word finding and with numbers when we first met in Dec 2018. He has had interim w/u in the form of blood work, which was benign, MRI brain wo contrast in 07/2017, which showed fairly age-appropriate findings and recent neuropsych testing, which is supportive of a mixed dementia Dx. He does not have a telltale family history of Alzheimer's disease or dementia in general. His memory scores are stable since Aricept added.    PLAN: Continue Aricept 10 mg daily we will refill Memory score is stable 26/30 Follow-up in 6 months I spent 25 min  in total face to face time with the patient more than 50% of which was spent counseling and coordination of care, reviewing test results reviewing medications and discussing and reviewing the diagnosis of dementia and further treatment options.  Additional medications may be added later on.We talked about  his driving and he is advised to monitor his driving skills with the  help of his family and limits himself as far as driving goes. He is advised to stay active mentally and physically, pursue a healthy lifestyle, good nutrition, good hydration, set bedtime and rise time routine. He is advised to stay active socially as well.  Dennie Bible, [facility], [facility], APRN  [facility] [address] [place], [place] [address] [phone]   I reviewed the above note and documentation by the Nurse Practitioner and agree with the history, physical exam, assessment and plan as outlined above. I was immediately available for face-to-face consultation. Star Age, MD, PhD [facility])

## 2018-06-06 ENCOUNTER — Encounter: Payer: Self-pay | Admitting: Nurse Practitioner

## 2018-06-06 ENCOUNTER — Other Ambulatory Visit: Payer: Self-pay | Admitting: Nurse Practitioner

## 2018-06-06 ENCOUNTER — Ambulatory Visit: Payer: Medicare Other | Admitting: Nurse Practitioner

## 2018-06-06 DIAGNOSIS — R413 Other amnesia: Secondary | ICD-10-CM

## 2018-06-06 DIAGNOSIS — I2510 Atherosclerotic heart disease of native coronary artery without angina pectoris: Secondary | ICD-10-CM

## 2018-06-06 DIAGNOSIS — F0390 Unspecified dementia without behavioral disturbance: Secondary | ICD-10-CM | POA: Insufficient documentation

## 2018-06-06 MED ORDER — DONEPEZIL HCL 10 MG PO TABS: 10.0000 mg | ORAL_TABLET | Freq: Every day | ORAL | 6 refills | Status: DC

## 2018-06-06 NOTE — Telephone Encounter (Signed)
Last seen 01/04/18  MMM

## 2018-06-06 NOTE — Patient Instructions (Signed)
Continue Aricept 10 mg daily we will refill Memory score is stable Follow-up in 6 months

## 2018-06-07 ENCOUNTER — Ambulatory Visit (INDEPENDENT_AMBULATORY_CARE_PROVIDER_SITE_OTHER): Payer: Medicare Other | Admitting: *Deleted

## 2018-06-07 ENCOUNTER — Encounter: Payer: Self-pay | Admitting: *Deleted

## 2018-06-07 VITALS — BP 145/66 | HR 58 | Ht 68.0 in | Wt 185.0 lb

## 2018-06-07 DIAGNOSIS — Z0000 Encounter for general adult medical examination without abnormal findings: Secondary | ICD-10-CM | POA: Diagnosis not present

## 2018-06-07 NOTE — Patient Instructions (Signed)
  Adam Rowland , Thank you for taking time to come for your Medicare Wellness Visit. I appreciate your ongoing commitment to your health goals. Please review the following plan we discussed and let me know if I can assist you in the future.   These are the goals we discussed: Goals    . Exercise 150 minutes per week (moderate activity)       This is a list of the screening recommended for you and due dates:  Health Maintenance  Topic Date Due  . Flu Shot  03/29/2018  . Colon Cancer Screening  11/30/2022  . Tetanus Vaccine  12/13/2022  .  Hepatitis C: One time screening is recommended by Center for Disease Control  (CDC) for  adults born from [birth year] through 1964.   Completed  . Pneumonia vaccines  Completed

## 2018-06-07 NOTE — Progress Notes (Addendum)
Subjective:   Adam Rowland is a 72 y.o. male who presents for a Medicare Annual Wellness Visit. Adam Rowland lives at home with his wife. He is a retired Airline pilot. He has one son and one granddaughter. They both live locally. 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  He has pet goats that he enjoys and he is a member of the [employer]. They are well known in the community for their deep fried peanut fundraiser and helps out with that.   Review of Systems    Patient reports that his overall health is unchanged compared to last year.  Cardiac Risk Factors include: advanced age (>3men, >15 women);dyslipidemia;hypertension;male gender  Neurological: Dementia-feels that memory has improved some since starting Aricept. 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 He sees his eye doctor routinely to follow up  All other systems negative       Current Medications (verified) Outpatient Encounter Medications as of 06/07/2018  Medication Sig  . aspirin EC 81 MG EC tablet Take 1 tablet (81 mg total) by mouth daily.  Marland Kitchen atorvastatin (LIPITOR) 40 MG tablet TAKE 1 TABLET ONCE DAILY AT BEDTIME  . donepezil (ARICEPT) 10 MG tablet Take 1 tablet (10 mg total) by mouth at bedtime. 1 pill  10mg  daily  . lisinopril (PRINIVIL,ZESTRIL) 20 MG tablet Take 1 tablet (20 mg total) by mouth daily.  . metoprolol tartrate (LOPRESSOR) 25 MG tablet TAKE (1) TABLET TWICE A DAY.  . nitroGLYCERIN (NITROSTAT) 0.4 MG SL tablet Place 1 tablet (0.4 mg total) under the tongue every 5 (five) minutes x 3 doses as needed for chest pain.  . prasugrel (EFFIENT) 10 MG TABS tablet Take 1 tablet (10 mg total) by mouth daily.   No facility-administered encounter medications on file as of 06/07/2018.     Allergies (verified) Patient has no known allergies.   History: Past Medical History:    Diagnosis Date  . CAD (coronary artery disease)    a. 05/2014 NSTEMI/PCI: LM nl, LAD 67m (3.0x23 Xience Alpine DES), D1 90, LCX 51m, OM1 80, RCA 30p/d, RPDA 40-50p, EF 40-45% ->Plan for staged PCI of LCX/OM1.  . Cardiomyopathy, ischemic    a. 05/2014 Echo: Ef 45-50%, mid-apicalanteroseptal AK, Gr 2 DD.  Marland Kitchen Cataract   . Fracture, ankle 06/12/2014  . Hyperlipidemia   . Hypertension   . Myocardial infarction [facility]) 2015   non STEMI  . Nephrolithiasis 01/16/2013  . NSVT (nonsustained ventricular tachycardia) (HCC)    a. 05/2014 in setting of NSTEMI -  asymptomatic.   Past Surgical History:  Procedure Laterality Date  . ANKLE FRACTURE SURGERY    . CARDIAC CATHETERIZATION    . CARDIAC CATHETERIZATION Right 06/20/2014   Procedure: CORONARY STENT INTERVENTION;  Surgeon: Peter M Swaziland, MD;  Location: [facility] CATH LAB;  Service: Cardiovascular;  Laterality: Right;  . CATARACT EXTRACTION Bilateral   . CORONARY STENT PLACEMENT    . LEFT HEART CATHETERIZATION WITH CORONARY ANGIOGRAM N/A 06/20/2014   Procedure: LEFT HEART CATHETERIZATION WITH CORONARY ANGIOGRAM;  Surgeon: Peter M Swaziland, MD;  Location: [facility] CATH LAB;  Service: Cardiovascular;  Laterality: N/A;  . NASAL SEPTUM SURGERY    . VASECTOMY     Family History  Problem Relation Age of Onset  . Hypertension Mother   . Cancer Father  bladder ca  . Hyperlipidemia Father   . Alcohol abuse Father   . Hyperlipidemia Sister   . Cancer Brother   . Nephrolithiasis Brother   . Colon cancer Neg Hx   . Stomach cancer Neg Hx   . Esophageal cancer Neg Hx    Social History   Socioeconomic History  . Marital status: Married    Spouse name: Not on file  . Number of children: Not on file  . Years of education: Not on file  . Highest education level: Not on file  Occupational History  . Occupation: Retired  Engineer, production  . Financial resource strain: Not hard at all  . Food insecurity:    Worry: Never true    Inability: Never true  .  Transportation needs:    Medical: No    Non-medical: No  Tobacco Use  . Smoking status: Former Smoker    Packs/day: 1.00    Years: 23.00    Pack years: 23.00    Types: Cigarettes    Start date: 10/26/1964    Last attempt to quit: 08/30/1987    Years since quitting: 30.7  . Smokeless tobacco: Former Neurosurgeon    Types: Chew    Quit date: 02/27/1988  . Tobacco comment: quit in 1989, smoked since he was a teenager/chewed tobacco for about 6 months after he quit smoking cigarettes  Substance and Sexual Activity  . Alcohol use: Yes    Alcohol/week: 7.0 standard drinks    Types: 7 Glasses of wine per week    Comment: 1 glass of wine nightly   . Drug use: No  . Sexual activity: Yes  Lifestyle  . Physical activity:    Days per week: 7 days    Minutes per session: 30 min  . Stress: Not at all  Relationships  . Social connections:    Talks on phone: More than three times a week    Gets together: More than three times a week    Attends religious service: More than 4 times per year    Active member of club or organization: Yes    Attends meetings of clubs or organizations: More than 4 times per year    Relationship status: Married  Other Topics Concern  . 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 He has one son and one granddaughter.     Tobacco Use No.  Clinical Intake:           Nutritional Status: BMI 25 -29 Overweight Diabetes: No  How often do you need to have someone help you when you read instructions, pamphlets, or other written materials from your doctor or pharmacy?: 1 - Never What is the last grade level you completed in school?: bachelor's degree  Interpreter Needed?: No     Activities of Daily Living In your present state of health, do you have any difficulty performing the following activities: 06/07/2018  Hearing? N  Vision? N  Difficulty concentrating or making decisions? Y  Comment diagnosed with dementia.  Seeing neurologist  Walking or climbing stairs? N  Dressing or bathing? N  Doing errands, shopping? N  Preparing Food and eating ? N  Using the Toilet? N  In the past six months, have you accidently leaked urine? N  Do you have problems with loss of bowel control? N  Managing your Medications? N  Managing your Finances? N  Housekeeping or managing your Housekeeping? N  Some recent data  might be hidden    Diet 3 meals-mostly homecooked Eats out on Friday nights Some snacks in between (peanuts and saltines) Drinks 2 or 3 water bottles with lemonade packet mixes   Exercise Current Exercise Habits: Home exercise routine, Type of exercise: treadmill, Time (Minutes): 30, Frequency (Times/Week): 7, Weekly Exercise (Minutes/Week): 210, Intensity: Mild, Exercise limited by: None identified    Depression Screen PHQ 2/9 Scores 06/07/2018 01/04/2018 06/06/2017 03/10/2017  PHQ - 2 Score 0 0 0 0     Fall Risk Fall Risk  06/07/2018 01/04/2018 06/06/2017 03/10/2017 12/01/2016  Falls in the past year? Yes No No No No  Number falls in past yr: 2 or more - - - -  Injury with Fall? No - - - -  Comment - - - - -  Risk Factor Category  High Fall Risk - - - -  Risk for fall due to : History of fall(s);Impaired balance/gait;Medication side effect - - - -    Safety Is the patient's home free of loose throw rugs in walkways, pet beds, electrical cords, etc?   yes       Handrails on the stairs?   yes      Adequate lighting?   yes  Patient Care Team: Bennie Pierini, FNP as PCP - General (Nurse Practitioner) Tora Duck, PA-C as Consulting Physician (Physician Assistant) Laqueta Linden, MD as Consulting Physician (Cardiology) Toni Arthurs, MD as Consulting Physician (Orthopedic Surgery) Derryl Harbor, OD as Consulting Physician (Optometry) Stephannie Li, MD as Consulting Physician (Ophthalmology)  Hospitalizations, surgeries, and ER visits in previous 12 months No hospitalizations,  ER visits, or surgeries this past year.   Objective:    Today's Vitals   06/07/18 0858  BP: (!) 145/66  Pulse: (!) 58  Weight: 185 lb (83.9 kg)  Height: 5\' 8"  (1.727 m)   Body mass index is 28.13 kg/m.  Advanced Directives 06/07/2018 06/06/2017 06/02/2016 05/27/2015 08/07/2014 07/21/2014 06/19/2014  Does Patient Have a Medical Advance Directive? Yes Yes Yes Yes Yes Yes No  Type of Advance Directive Living will;Healthcare Power of Attorney Living will Healthcare Power of [place];Living will Healthcare Power of [place];Living will Healthcare Power of [place];Living will - -  Does patient want to make changes to medical advance directive? No - Patient declined No - Patient declined No - Patient declined No - Patient declined No - Patient declined - -  Copy of Healthcare Power of Attorney in Chart? No - copy requested No - copy requested No - copy requested No - copy requested No - copy requested No - copy requested -  Would patient like information on creating a medical advance directive? - - - - - - No - patient declined information    Hearing/Vision  No hearing or vision deficits noted during visit.   Cognitive Function: MMSE - Mini Mental State Exam 06/06/2018 06/06/2017 03/10/2017 06/02/2016 05/27/2015  Not completed: (No Data) - - - -  Orientation to time 3 4 4 5 5   Orientation to time comments - said 2008 instead of 2018. His wife looked at him and he said "it is 2008, isn't it?" - - -  Orientation to Place 4 5 5 5 5   Registration 3 3 3 3 3   Attention/ Calculation 5 3 4 4 5   Attention/Calculation-comments - took considerable effort to spell "world" backwards - - -  Recall 3 1 2  - 3  Language- name 2 objects 2 2 2 2 2   Language- repeat 1 1  1 1 -  Language- follow 3 step command 2 3 3 3 3   Language- read & follow direction 1 1 1 1 1   Language-read & follow direction-comments - I had to ask him to follow the directions of the sentence several times before it registered that he  needed to close his eyes - - -  Write a sentence 1 1 1 1 1   Copy design 1 0 1 1 1   Total score 26 24 27  - -  MMSE done at Neurologist office yesterday. I did not repeat it today.       Immunizations and Health Maintenance Immunization History  Administered Date(s) Administered  . Influenza Split 10/08/2012, 07/09/2013  . Influenza, High Dose Seasonal PF 06/02/2016, 07/05/2017  . Influenza,inj,Quad PF,6+ Mos 06/21/2014, 05/27/2015  . Pneumococcal Conjugate-13 02/16/2015  . Pneumococcal Polysaccharide-23 12/12/2012  . Tdap 12/12/2012  . Zoster 10/08/2012   Health Maintenance Due  Topic Date Due  . INFLUENZA VACCINE  03/29/2018   Health Maintenance  Topic Date Due  . INFLUENZA VACCINE  03/29/2018  . COLONOSCOPY  11/30/2022  . TETANUS/TDAP  12/13/2022  . Hepatitis C Screening  Completed  . PNA vac Low Risk Adult  Completed        Assessment:   This is a routine wellness examination for Adam Rowland.      Plan:    Goals    . Exercise 150 minutes per week (moderate activity)        Health Maintenance Recommendations: Influenza vaccine  Additional Screening Recommendations: Lung: Low Dose CT Chest recommended if Age 11-80 years, 30 pack-year currently smoking OR have quit w/in 15years. Patient does not qualify. Hepatitis C Screening recommended: no  Keep f/u with Bennie Pierini, FNP and any other specialty appointments you may have Continue current medications Move carefully to avoid falls. Use assistive devices like a cane or walker if needed. Aim for at least 150 minutes of moderate activity a week. This can be chair exercises if necessary. Reading or puzzles are a good way to exercise your brain Stay connected with friends and family. Social connections are beneficial to your emotional and mental health.   I have personally reviewed and noted the following in the patient's chart:   . Medical and social history . Use of alcohol, tobacco or illicit drugs   . Current medications and supplements . Functional ability and status . Nutritional status . Physical activity . Advanced directives . List of other physicians . Hospitalizations, surgeries, and ER visits in previous 12 months . Vitals . Screenings to include cognitive, depression, and falls . Referrals and appointments  In addition, I have reviewed and discussed with patient certain preventive protocols, quality metrics, and best practice recommendations. A written personalized care plan for preventive services as well as general preventive health recommendations were provided to patient.     Demetrios Loll, RN   06/07/2018   I have reviewed and agree with the above AWV documentation.   [name] Daphine Deutscher, FNP

## 2018-06-13 ENCOUNTER — Ambulatory Visit (INDEPENDENT_AMBULATORY_CARE_PROVIDER_SITE_OTHER): Payer: Medicare Other

## 2018-06-13 DIAGNOSIS — Z23 Encounter for immunization: Secondary | ICD-10-CM

## 2018-06-13 NOTE — Addendum Note (Signed)
Addended by: Cleda Daub on: 06/13/2018 04:22 PM   Modules accepted: Orders

## 2018-06-18 ENCOUNTER — Other Ambulatory Visit: Payer: Self-pay | Admitting: Nurse Practitioner

## 2018-06-18 DIAGNOSIS — I214 Non-ST elevation (NSTEMI) myocardial infarction: Secondary | ICD-10-CM

## 2018-06-27 ENCOUNTER — Encounter: Payer: Self-pay | Admitting: Podiatry

## 2018-06-27 ENCOUNTER — Ambulatory Visit: Payer: Medicare Other | Admitting: Podiatry

## 2018-06-27 DIAGNOSIS — M779 Enthesopathy, unspecified: Secondary | ICD-10-CM | POA: Diagnosis not present

## 2018-06-27 DIAGNOSIS — L84 Corns and callosities: Secondary | ICD-10-CM

## 2018-06-27 MED ORDER — TRIAMCINOLONE ACETONIDE 10 MG/ML IJ SUSP
10.0000 mg | Freq: Once | INTRAMUSCULAR | Status: AC
  Administered 2018-06-27: 10 mg

## 2018-06-27 NOTE — Progress Notes (Signed)
Subjective:   Patient ID: Adam Rowland, male   DOB: 72 y.o.   MRN: [id number]   HPI Patient presents with pain underneath the left fifth metatarsal head with fluid buildup and keratotic lesion stating he had about 4 to half to 5 months of complete relief followed by gradual recurrence   ROS      Objective:  Physical Exam  Neurovascular status intact with inflamed fifth MPJ right with fluid buildup and plantar capsulitis with keratotic tissue formation     Assessment:  Inflammatory capsulitis fifth MPJ left with keratotic lesion formation     Plan:  Sterile prep and I then went ahead and injected the fifth MPJ 2 mg dexamethasone Kenalog 5 mg Xylocaine and debrided the lesion fully with no iatrogenic bleeding and advised that ultimately this may require fifth metatarsal head resection depending on how quickly it reoccurs

## 2018-07-05 DIAGNOSIS — L821 Other seborrheic keratosis: Secondary | ICD-10-CM | POA: Diagnosis not present

## 2018-07-05 DIAGNOSIS — L570 Actinic keratosis: Secondary | ICD-10-CM | POA: Diagnosis not present

## 2018-07-05 DIAGNOSIS — D225 Melanocytic nevi of trunk: Secondary | ICD-10-CM | POA: Diagnosis not present

## 2018-07-05 DIAGNOSIS — Z85828 Personal history of other malignant neoplasm of skin: Secondary | ICD-10-CM | POA: Diagnosis not present

## 2018-07-11 ENCOUNTER — Other Ambulatory Visit: Payer: Self-pay | Admitting: Nurse Practitioner

## 2018-07-11 DIAGNOSIS — I214 Non-ST elevation (NSTEMI) myocardial infarction: Secondary | ICD-10-CM

## 2018-07-12 ENCOUNTER — Ambulatory Visit: Payer: Medicare Other | Admitting: Nurse Practitioner

## 2018-07-23 ENCOUNTER — Encounter: Payer: Self-pay | Admitting: Nurse Practitioner

## 2018-07-23 ENCOUNTER — Ambulatory Visit: Payer: Medicare Other | Admitting: Nurse Practitioner

## 2018-07-23 VITALS — BP 128/70 | HR 58 | Temp 97.6°F | Ht 68.0 in | Wt 181.0 lb

## 2018-07-23 DIAGNOSIS — R413 Other amnesia: Secondary | ICD-10-CM

## 2018-07-23 DIAGNOSIS — I214 Non-ST elevation (NSTEMI) myocardial infarction: Secondary | ICD-10-CM

## 2018-07-23 DIAGNOSIS — E8881 Metabolic syndrome: Secondary | ICD-10-CM

## 2018-07-23 DIAGNOSIS — I2510 Atherosclerotic heart disease of native coronary artery without angina pectoris: Secondary | ICD-10-CM | POA: Diagnosis not present

## 2018-07-23 DIAGNOSIS — I10 Essential (primary) hypertension: Secondary | ICD-10-CM | POA: Diagnosis not present

## 2018-07-23 DIAGNOSIS — Z6827 Body mass index (BMI) 27.0-27.9, adult: Secondary | ICD-10-CM

## 2018-07-23 DIAGNOSIS — F0390 Unspecified dementia without behavioral disturbance: Secondary | ICD-10-CM | POA: Diagnosis not present

## 2018-07-23 DIAGNOSIS — Z125 Encounter for screening for malignant neoplasm of prostate: Secondary | ICD-10-CM

## 2018-07-23 DIAGNOSIS — E782 Mixed hyperlipidemia: Secondary | ICD-10-CM

## 2018-07-23 MED ORDER — PRASUGREL HCL 10 MG PO TABS: 10.0000 mg | ORAL_TABLET | Freq: Every day | ORAL | 1 refills | Status: DC

## 2018-07-23 MED ORDER — LISINOPRIL 20 MG PO TABS: 20.0000 mg | ORAL_TABLET | Freq: Every day | ORAL | 1 refills | Status: DC

## 2018-07-23 MED ORDER — ATORVASTATIN CALCIUM 40 MG PO TABS: ORAL_TABLET | ORAL | 1 refills | Status: DC

## 2018-07-23 MED ORDER — METOPROLOL TARTRATE 25 MG PO TABS: 25.0000 mg | ORAL_TABLET | Freq: Two times a day (BID) | ORAL | 1 refills | Status: DC

## 2018-07-23 NOTE — Addendum Note (Signed)
Addended by: Bennie Pierini on: 07/23/2018 10:34 AM   Modules accepted: Orders

## 2018-07-23 NOTE — Patient Instructions (Signed)

## 2018-07-23 NOTE — Progress Notes (Signed)
Subjective:    Patient ID: Adam Rowland, male    DOB: 05-19-1946, 72 y.o.   MRN: [id number]   Chief Complaint: Medical Management of Chronic Issues   HPI:  1. Coronary artery disease involving native coronary artery of native heart without angina pectoris last saw cardiology 06/14/17. No change to plan of care according to office note.  2. Essential hypertension  no c/o chest pain, sob or headache. Does not check blood pressure at home.  3. Dementia without behavioral disturbance, unspecified dementia type [facility])  He is seeing neurology and had last follow up  On 06/06/18, they are calling it mixed dementia, and he is to continue aricept.  4. BMI 27.0-27.9,adult  Weight is down 4 lbs from previous visit  5. Mixed hyperlipidemia  Has not been watching diet and rides exercise bike daily  6. Memory loss again this has been dx as mixed dementia and is to continue aricept.  7. Metabolic syndrome  He does not watch carbs in diet . He does not check blood sugars at home.  8. NSTEMI (non-ST elevated myocardial infarction) ([place])  Again last saw cardiology on 06/14/17 with no change to plan of care    Outpatient Encounter Medications as of 07/23/2018  Medication Sig  . aspirin EC 81 MG EC tablet Take 1 tablet (81 mg total) by mouth daily.  Marland Kitchen atorvastatin (LIPITOR) 40 MG tablet TAKE 1 TABLET ONCE DAILY AT BEDTIME  . donepezil (ARICEPT) 10 MG tablet Take 1 tablet (10 mg total) by mouth at bedtime. 1 pill  1m daily  . lisinopril (PRINIVIL,ZESTRIL) 20 MG tablet Take 1 tablet (20 mg total) by mouth daily.  . metoprolol tartrate (LOPRESSOR) 25 MG tablet TAKE (1) TABLET TWICE A DAY.  . nitroGLYCERIN (NITROSTAT) 0.4 MG SL tablet Place 1 tablet (0.4 mg total) under the tongue every 5 (five) minutes x 3 doses as needed for chest pain.  . prasugrel (EFFIENT) 10 MG TABS tablet TAKE 1 TABLET BY MOUTH EVERY DAY     New complaints: None today  Social history: Lives with wife. Is not able to work  due to disability   Review of Systems  Constitutional: Negative for activity change and appetite change.  HENT: Negative.   Eyes: Negative for pain.  Respiratory: Negative for shortness of breath.   Cardiovascular: Negative for chest pain, palpitations and leg swelling.  Gastrointestinal: Negative for abdominal pain.  Endocrine: Negative for polydipsia.  Genitourinary: Negative.   Skin: Negative for rash.  Neurological: Negative for dizziness, weakness and headaches.  Hematological: Does not bruise/bleed easily.  Psychiatric/Behavioral: Negative.   All other systems reviewed and are negative.      Objective:   Physical Exam  Constitutional: He is oriented to person, place, and time. He appears well-developed and well-nourished.  HENT:  Head: Normocephalic.  Nose: Nose normal.  Mouth/Throat: Oropharynx is clear and moist.  Eyes: Pupils are equal, round, and reactive to light. EOM are normal.  Neck: Normal range of motion and phonation normal. Neck supple. No JVD present. Carotid bruit is not present. No thyroid mass and no thyromegaly present.  Cardiovascular: Normal rate and regular rhythm.  Pulmonary/Chest: Effort normal and breath sounds normal. No respiratory distress.  Abdominal: Soft. Normal appearance, normal aorta and bowel sounds are normal. There is no tenderness.  Musculoskeletal: Normal range of motion.  Lymphadenopathy:    He has no cervical adenopathy.  Neurological: He is alert and oriented to person, place, and time.  Skin: Skin is  warm and dry.  Psychiatric: He has a normal mood and affect. His behavior is normal. Judgment and thought content normal.  Nursing note and vitals reviewed.     BP 128/70   Pulse (!) 58   Temp 97.6 F (36.4 C) (Oral)   Ht _0  (1.727 m)   Wt 181 lb (82.1 kg)   BMI 27.52 kg/m      Assessment & Plan:  Adam Rowland comes in today with chief complaint of Medical Management of Chronic Issues   Diagnosis and orders  addressed:  1. Coronary artery disease involving native coronary artery of native heart without angina pectoris need to follow up with cardiology yearly- he will make appointment - metoprolol tartrate (LOPRESSOR) 25 MG tablet; Take 1 tablet (25 mg total) by mouth 2 (two) times daily.  Dispense: 180 tablet; Refill: 1  2. Essential hypertension Low sodium diet - CMP14+EGFR - lisinopril (PRINIVIL,ZESTRIL) 20 MG tablet; Take 1 tablet (20 mg total) by mouth daily.  Dispense: 90 tablet; Refill: 1  3. Dementia without behavioral disturbance, unspecified dementia type ([place]) Keep follow up with neurology Brain games daily  4. BMI 27.0-27.9,adult Discussed diet and exercise for person with BMI >25 Will recheck weight in 3-6 months  5. Mixed hyperlipidemia Low fat diet - Lipid panel - atorvastatin (LIPITOR) 40 MG tablet; TAKE 1 TABLET ONCE DAILY AT BEDTIME  Dispense: 90 tablet; Refill: 1  6. Memory loss Brain games discussed  7. Metabolic syndrome Watch carbsin diet  8. NSTEMI (non-ST elevated myocardial infarction) (HCC) - prasugrel (EFFIENT) 10 MG TABS tablet; Take 1 tablet (10 mg total) by mouth daily.  Dispense: 90 tablet; Refill: 1  9. Prostate cancer screening - PSA, total and free   Labs pending Health Maintenance reviewed Diet and exercise encouraged  Follow up plan: 6 months   [place], FNP

## 2018-09-30 DIAGNOSIS — R42 Dizziness and giddiness: Secondary | ICD-10-CM | POA: Diagnosis not present

## 2018-11-27 ENCOUNTER — Other Ambulatory Visit: Payer: Self-pay

## 2018-11-27 ENCOUNTER — Encounter: Payer: Self-pay | Admitting: Nurse Practitioner

## 2018-11-27 ENCOUNTER — Ambulatory Visit: Payer: Medicare Other | Admitting: Nurse Practitioner

## 2018-11-27 VITALS — BP 143/77 | HR 60 | Temp 98.6°F | Ht 68.0 in | Wt 181.0 lb

## 2018-11-27 DIAGNOSIS — S0083XA Contusion of other part of head, initial encounter: Secondary | ICD-10-CM

## 2018-11-27 DIAGNOSIS — S0181XA Laceration without foreign body of other part of head, initial encounter: Secondary | ICD-10-CM

## 2018-11-27 NOTE — Patient Instructions (Signed)
Facial or Scalp Contusion    A facial or scalp contusion is a deep bruise (contusion) on the face or head. Bruises happen when an injury causes bleeding under the skin. The bruise may turn blue, purple, or yellow. Minor injuries will cause a bruise that is not painful, but worse bruises can stay painful and swollen for a few weeks.  Follow these instructions at home:   Take over-the-counter and prescription medicines only as told by your doctor.   If directed, apply ice to the injured area.  ? Put ice in a plastic bag.  ? Place a towel between your skin and the bag.  ? Leave the ice on for 20 minutes, 2-3 times a day.   Keep all follow-up visits as told by your doctor. This is important.  Contact a doctor if:   You have trouble biting or chewing.   Your pain or swelling gets worse.   You have pain when you move your eyes.  Get help right away if:   You have very bad pain or a headache, and medicine does not help.   You are very tired or confused, or your personality changes.   You throw up (vomit).   You have a nosebleed that does not stop.   You see two of everything (double vision) or have blurry vision.   You have clear fluid coming from your nose or ear, and it does not go away.   You have problems walking or using your arms or legs.   You are very dizzy.  Summary   A facial or scalp contusion is a deep bruise (contusion) on the face or head.   Bruises happen when an injury causes bleeding under the skin.   Minor injuries will cause a bruise that is not painful, but worse bruises can stay painful and swollen for a few weeks.  This information is not intended to replace advice given to you by your health care provider. Make sure you discuss any questions you have with your health care provider.  Document Released: 08/04/2011 Document Revised: 07/05/2016 Document Reviewed: 07/05/2016  Elsevier Interactive Patient Education  2019 [employer]

## 2018-11-27 NOTE — Progress Notes (Signed)
   Subjective:    Patient ID: Adam Rowland, male    DOB: Oct 28, 1945, 73 y.o.   MRN: [id number]   Chief Complaint: Epistaxis Larey Seat out of bed and hit nose on night stand)   HPI Patient come sin with wife. She states that he fell out of bed last night and hit his nose on night stand. Started bleeding this morni ng and will not stop.he is on blood thinner.   Review of Systems  Eyes: Negative.   Respiratory: Negative.   Neurological: Negative for dizziness and headaches.  Hematological: Negative.   Psychiatric/Behavioral: Negative.   All other systems reviewed and are negative.      Objective:   Physical Exam Vitals signs and nursing note reviewed.  Constitutional:      Appearance: Normal appearance.  Eyes:     Pupils: Pupils are equal, round, and reactive to light.     Comments: Scleral hemorrhage of left eye  Cardiovascular:     Rate and Rhythm: Normal rate and regular rhythm.     Heart sounds: Normal heart sounds.  Pulmonary:     Effort: Pulmonary effort is normal.     Breath sounds: Normal breath sounds.  Skin:    Comments: Superficial bleeding scrape on bridge of nose-trickling blood Contusion of bridge of nose and left eye.  Neurological:     Mental Status: He is alert.    BP (!) 143/77   Pulse 60   Temp 98.6 F (37 C) (Oral)   Ht 5\' 8"  (1.727 m)   Wt 181 lb (82.1 kg)   BMI 27.52 kg/m    Facial laceration cauterized with silver nitrate sticks.     Assessment & Plan:  REX SULLO in today with chief complaint of Epistaxis Larey Seat out of bed and hit nose on night stand)   1. Contusion of face, initial encounter Ice bid  2. Facial laceration, initial encounter Do not ick or scratch at area RTO prn  Symptoms of ICP discussed with widfe. [name] Daphine Deutscher, FNP

## 2018-12-03 ENCOUNTER — Telehealth: Payer: Self-pay | Admitting: Neurology

## 2018-12-03 NOTE — Telephone Encounter (Signed)
Pt returned my call. MMSE completed, 16/21, AFT is 4. Pt understands that he should have his wife and a pen and paper available for his telephone visit with Dr. Frances Furbish, in case she has further memory testing questions for him. Pt verbalized understanding.  Please call pt's HOME number- [phone].

## 2018-12-03 NOTE — Telephone Encounter (Signed)
I called pt, spoke with pt's wife, [name], per DPR. Pt's meds, allergies ,and PMH were updated.  Pt will call me back this afternoon to complete his MMSE testing.

## 2018-12-03 NOTE — Telephone Encounter (Signed)
Called patient to discuss setting up a virtual visit. He explained that he would not be comfortable with this type of visit. I then offered him the option to do a telephone visit, to which he gave consent. I advised him that Dr. Teofilo Pod nurse will be reaching out to him to discuss current history and medications. Patient verbalized consent.

## 2018-12-06 ENCOUNTER — Ambulatory Visit (INDEPENDENT_AMBULATORY_CARE_PROVIDER_SITE_OTHER): Payer: Medicare Other | Admitting: Neurology

## 2018-12-06 ENCOUNTER — Other Ambulatory Visit: Payer: Self-pay

## 2018-12-06 ENCOUNTER — Encounter: Payer: Self-pay | Admitting: Neurology

## 2018-12-06 DIAGNOSIS — F0280 Dementia in other diseases classified elsewhere without behavioral disturbance: Secondary | ICD-10-CM | POA: Diagnosis not present

## 2018-12-06 DIAGNOSIS — G309 Alzheimer's disease, unspecified: Secondary | ICD-10-CM

## 2018-12-06 DIAGNOSIS — F0150 Vascular dementia without behavioral disturbance: Secondary | ICD-10-CM

## 2018-12-06 DIAGNOSIS — R4789 Other speech disturbances: Secondary | ICD-10-CM

## 2018-12-06 MED ORDER — DONEPEZIL HCL 10 MG PO TABS: 10.0000 mg | ORAL_TABLET | Freq: Every day | ORAL | 3 refills | Status: DC

## 2018-12-06 NOTE — Patient Instructions (Signed)
Given over the phone during today's phone call virtual visit.  

## 2018-12-06 NOTE — Progress Notes (Signed)
Interim history:   Adam Rowland is a 73 year old right-handed gentleman with an underlying medical history of coronary artery disease with history of non-STEMI, history of ischemic cardiomyopathy, hyperlipidemia, hypertension, kidney stones, nonsustained V. tach, and overweight state, with whom I am conducting a virtual, phone call follow-up appointment today to discuss his memory loss. The patient is accompanied by his wife Adam Rowland today with whom I also talked via home phone. I last saw him on 02/13/2018, at which time his MMSE was 21. We talked about his neuropsychological test results from May 2019. I suggested we start him on Aricept 5 mg strength with titration to 10 mg.  He saw Cecille Rubin, nurse practitioner in the interim on 06/06/2018, at which time his MMSE was 26. He was advised to continue with Aricept.  Today, 12/06/18: Please see below for documentation on the virtual visit.   The patient's allergies, current medications, family history, past medical history, past social history, past surgical history and problem list were reviewed and updated as appropriate.    Previously:  I first met him on 08/14/2017 at the request of his primary care nurse practitioner, at which time he reported short-term memory loss for the past 3-4 years, maybe 5 years. I suggested further workup in the form of neuropsychological evaluation and blood work as well as brain scan. His blood work was unremarkable with the exception of A1c in the prediabetes range. We checked thyroid function with TSH and B12 level as well. RPR was negative. He had a brain MRI with contrast on 08/18/2017 and I reviewed the results: IMPRESSION:  Slightly abnormal MRI scan of the brain showing mild degree of generalized cerebral atrophy. 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   He was called with the results.    He had interim  neuropsychological evaluation on 01/01/2018 as well as a feedback appointment on 01/08/18 with Dr. Bonita Quin and I reviewed the results: << Clinical Impressions: Mild dementia, unspecified, without behavioral disturbance (rule out mixed vascular dementia and Alzheimer's disease).  Results of cognitive testing were abnormal and demonstrated significant impairment in several domains of cognitive function. Additionally, there is evidence that his cognitive deficits are interfering with his ability to perform complex tasks such as managing appointments and finances. As such, diagnostic criteria for a dementia syndrome are met.  The patient's cognitive profile demonstrates both cortical and subcortical features. Consistent with cortical dysfunction, he demonstrated memory consolidation dysfunction and mildly impaired confrontation naming. Consistent with subcortical dysfunction, he demonstrated slowed processing speed, executive dysfunction and impaired verbal fluency. This may represent a mixed dementia. It is certainly possible that Alzheimer's disease is present, but I also wonder about vascular contribution based on cardiac history and cognitive profile. Surprisingly, brain MRI report did not mention any small vessel disease or remote infarcts.  Overall, based on current level of functioning and test scores, I would characterize his dementia as mild stage. There is no evidence of behavioral disturbance. There also is no evidence of depression, anxiety or other primary psychiatric disorder.     Recommendations/Plan: Based on the findings of the present evaluation, the following recommendations are offered:   1. I recommend that if he is going to continue driving that his driving be limited to familiar locations within close geographic distance during daylight hours (no long distance driving, no driving by himself to new areas/places). 2. His wife is already managing the finances and appointments and  reminding him to take his medications; this should be continued. 3. His family will likely benefit from education and support regarding mild dementia. They are referred to the [employer]. I provided them with a packet of written information as well. 4. He appears to be an appropriate candidate for cholinesterase inhibitor therapy, and he can follow up with Dr. Rexene Alberts about this. 5. Neuropsychological re-evaluation in 1-2 years could be considered in order to monitor cognitive status, track any progression of symptoms and further assist with treatment planning. >>   08/14/2017: (He) has had short term memory issues for the past 3-4 years, maybe even 5 years. He has noticed trouble focusing and also difficulty with numbers. 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 He has had some word finding difficulties.  Symptoms are not necessarily very progressive per patient. I reviewed your office note from 06/06/2017.  He is retired and lives with his wife. They have one child. He quit smoking in 1989, drinks alcohol in the form of wine, usually 1 glass per day, caffeine in the form of coffee, 3 cups daily on average.  He has had trouble multitasking, sequencing, per wife.  He snores, but no apneas are noted. He has no FHx of dementia or AD. MGM lived to be 37, mother is 56. Father passed away at age 75 from bladder cancer. Patient has an older half sister who is in her early 72s. He has never had a brain MRI. He has never had one-sided weakness or numbness or slurring of speech. He does not always drink enough water he admits. His driving has been okay per patient and his wife.  His Past Medical History Is Significant For: Past Medical History:  Diagnosis Date   CAD (coronary artery disease)    a. 05/2014 NSTEMI/PCI: LM nl, LAD 52m(3.0x23 Xience Alpine DES), D1 90, LCX 853mOM1 80, RCA 30p/d, RPDA 40-50p, EF 40-45% ->Plan for staged PCI of LCX/OM1.    Cardiomyopathy, ischemic    a. 05/2014 Echo: Ef 45-50%, mid-apicalanteroseptal AK, Gr 2 DD.   Cataract    Fracture, ankle 06/12/2014   Hyperlipidemia    Hypertension    [facility]   non [facility]   Nephrolithiasis 01/16/2013   [facility]VT (n[facility]ustained ventricular tachycardia) [facility]   a. 05/2014 in setting of NSTEMI -  asymptomatic.    His Past Surgical History Is Significant For: Past Surgical History:  Procedure Laterality Date   ANKLE FRACTURE SURGERY     CARDIAC CATHETERIZATION     CARDIAC CATHETERIZATION Right 06/20/2014   Procedure: CORONARY STENT INTERVENTION;  Surgeon: Peter M JoMartiniqueMD;  [facility];  Service: Cardiovascular;  Laterality: Right;   CATARACT EXTRACTION Bilateral    CORONARY STENT PLACEMENT     LEFT HEART CATHETERIZATION WITH CORONARY ANGIOGRAM N/A 06/20/2014   Procedure: LEFT HEART CATHETERIZATION WITH CORONARY ANGIOGRAM;  Surgeon: Peter M JoMartiniqueMD;  [facility];  Service: Cardiovascular;  Laterality: N/A;   NASAL SEPTUM SURGERY     VASECTOMY      His Family History Is Significant For: Family History  Problem Relation Age of Onset   Hypertension Mother    Cancer Father        bladder ca   Hyperlipidemia Father    Alcohol abuse Father    Hyperlipidemia Sister    Cancer Brother    Nephrolithiasis Brother    Colon cancer Neg Hx    Stomach cancer  Neg Hx    Esophageal cancer Neg Hx     His Social History Is Significant For: Social History   Socioeconomic History   Marital status: Married    Spouse name: Not on file   Number of children: Not on file   Years of education: Not on file   Highest education level: Not on file  Occupational History   Occupation: Retired  Scientist, product/process development strain: Not hard at [employer] insecurity:    Worry: Never true    Inability: Never true   Transportation needs:    Medical: No    Non-medical: No  Tobacco Use   Smoking  status: Former Smoker    Packs/day: 1.00    Years: 23.00    Pack years: 23.00    Types: Cigarettes    Start date: 10/26/1964    Last attempt to quit: 08/30/1987    Years since quitting: 31.2   Smokeless tobacco: Former Systems developer    Types: [place] date: 02/27/1988   Tobacco comment: quit in 1989, smoked since he was a teenager/chewed tobacco for about 6 months after he quit smoking cigarettes  Substance and Sexual Activity   Alcohol use: Yes    Alcohol/week: 7.0 standard drinks    Types: 7 Glasses of wine per week    Comment: 1 glass of wine nightly    Drug use: No   Sexual activity: Yes  Lifestyle   Physical activity:    Days per week: 7 days    Minutes per session: 30 min   Stress: Not at all  Relationships   Social connections:    Talks on phone: More than three times a week    Gets together: More than three times a week    Attends religious service: More than 4 times per year    Active member of club or organization: Yes    Attends meetings of clubs or organizations: More than 4 times per year    Relationship status: Married  Other Topics Concern   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 He has one son and one granddaughter.     His Allergies Are:  No Known Allergies:   His Current Medications Are:  Outpatient Encounter Medications as of 12/06/2018  Medication Sig   aspirin EC 81 MG EC tablet Take 1 tablet (81 mg total) by mouth daily.   atorvastatin (LIPITOR) 40 MG tablet TAKE 1 TABLET ONCE DAILY AT BEDTIME   donepezil (ARICEPT) 10 MG tablet Take 1 tablet (10 mg total) by mouth at bedtime. 1 pill  26m daily   lisinopril (PRINIVIL,ZESTRIL) 20 MG tablet Take 1 tablet (20 mg total) by mouth daily.   metoprolol tartrate (LOPRESSOR) 25 MG tablet Take 1 tablet (25 mg total) by mouth 2 (two) times daily.   nitroGLYCERIN (NITROSTAT) 0.4 MG SL tablet Place 1 tablet (0.4 mg total) under the tongue every 5 (five) minutes  x 3 doses as needed for chest pain.   prasugrel (EFFIENT) 10 MG TABS tablet Take 1 tablet (10 mg total) by mouth daily.   No facility-administered encounter medications on file as of 12/06/2018.   :  Review of Systems:  Out of a complete 14 point review of systems, all are reviewed and negative with the exception of these symptoms as listed below:  Virtual Visit via Telephone Note on 12/06/18:   I connected with@ on 12/06/18 at  9:30 AM EDT by telephone and verified that I am speaking with the correct person using two identifiers.   I discussed the limitations, risks, security and privacy concerns of performing an evaluation and management service by telephone and the availability of in person appointments. I also discussed with the patient that there may be a patient responsible charge related to this service. The patient expressed understanding and agreed to proceed.   History of Present Illness: He reports doing fairly well, feels stable with regards to his memory, may be a little better. Certainly no worse per his report. He goes to [place] to their family all company business nearly daily, even currently with the corona virus situation. He does not have any work-related capacities at this time, goes in mostly to observe things. His wife believes that it is good for him to get out, have a peripheral solid day-to-day basis. She has looked into getting him a cloth based facial mask and she has looked up instructions to make your own mask with a bandanna or cloth. She has also ordered masks. He has not yet worn any mask and she is willing to try to convince him to be more careful. He also makes deposits at the bank from the ATM, does not go to the post office daily. She is again reminded to keep following all the recommendations for patients above 60 or 65 as far as social distancing stay-at-home orders go. His mother is 79 years old and he visits her from time to time, primarily to do her lawn  care. She does most of the driving currently, she is comfortable with him driving to the family business which is about 5 minutes away. Mom lives about 25 minutes away and he rarely does that trip, again she is reminded that he should not go visit her but do her lawn care and perhaps see her from the distance making sure she is okay. He reports having a good appetite, no significant weight loss. He had fallen out of bed and his laceration healed up. He has no residual problem with it he reports. He also likes to do yard work. His wife has noticed worsening of his short-term memory and also word finding difficulty and difficulty to comprehend complex news these days. It is harder for him to hold a longer conversation per her report. He seems to tolerate the donepezil fine. He is on 10 mg daily. She's wondering if we should increase the dose.   Observations/Objective: The most recent vital signs for my review are from 11/27/2018 when he saw his PCP nurse practitioner. Blood pressure 143/77, pulse 60, temperature 98.6, weight 181 for a BMI of 27.52. He is in no acute distress, pleasant and conversant but uses shorter sentences, not very elaborate in his report. His wife is able to give more details. His speech is clear, no dysarthria notable, no hypophonia or voice tremor.  His MMSE over the phone is 16 out of 22, animal fluency 4/m.  Assessment and Plan: In summary,Adam Rowland a very pleasant 56 year oldmalewith an underlying medical history of coronary artery disease with history of non-STEMI, history of ischemic cardiomyopathy, hyperlipidemia, hypertension, kidney stones, nonsustained V. tach, and overweight state, who presents for a full call follow-up for his memory loss with history, and test results including neuropsychological testing supportive of a more mixed form of dementia with vascular risk factors and also possibility of late onset Alzheimer's disease. He has had overall a 4-5 year history  of  forgetfulness, word finding difficulties and has had some progression over time. He has been on donepezil since June of last year and is currently on 10 mg daily. I did talk to the wife about potentially increasing this to 23 mg, however, sometimes we run into a higher risk with his side effects when we push the medicine to the maximum dose. Side effects can include balance problems, mouth dryness, abnormal dreams, headaches, GI problems including diarrhea. Therefore, rather than increasing to the maximum dose we sometimes utilize a second memory drug. I suggested we revisit this at our next visit hopefully face-to-face in or around 3 months from now. His memory scores are difficult to compare over the phone with previous numbers but he may have remained stable. He has had in the past w/u in the form of blood work, which was benign, MRI brain wo contrast in 07/2017, which showed fairly age-appropriate findings and neuropsych testing in May 2019, supportive of a mixed dementia Dx. He does not have a telltale family history of Alzheimer's disease or dementia in general. His memory scores have declined some with time. We will try to repeat in the office in 3 months for better actual comparison. He is currently advised to maintain his generic Aricept 10 mg daily and we will consider a second drug at her next visit. I will switch the prescription to 90 day supply with refills. We talked about his driving and he is advised to Limit his driving to familiar and close distances. His wife does most of the driving. I strongly advised patient and his wife to observe social distancing and stay at home recommendations especially for people above the age of 69. He is furthermore advised to stay active mentally and physically, continue a healthy lifestyle with good nutrition, good hydration, proper rest and physical activity. I answered all their questions today and the patient and his wife were in agreement with the  plan.  Follow Up Instructions: 1. Continue generic Aricept 10 mg daily, prescription updated to 90 days with refills. 2. Follow-up in 3 months for face-to-face visit, will repeat MMSE in the office at the time. 3.  Consider second memory medication such as generic Namenda next time or potentially pushing the Aricept dose up to the maximum dose.   I discussed the assessment and treatment plan with the patient. The patient was provided an opportunity to ask questions and all were answered. The patient agreed with the plan and demonstrated an understanding of the instructions.   The patient was advised to call back or seek an in-person evaluation if the symptoms worsen or if the condition fails to improve as anticipated.  I provided 22 minutes of non-face-to-face time during this encounter.   Star Age, MD

## 2019-01-03 DIAGNOSIS — L720 Epidermal cyst: Secondary | ICD-10-CM | POA: Diagnosis not present

## 2019-01-03 DIAGNOSIS — Z85828 Personal history of other malignant neoplasm of skin: Secondary | ICD-10-CM | POA: Diagnosis not present

## 2019-01-03 DIAGNOSIS — L821 Other seborrheic keratosis: Secondary | ICD-10-CM | POA: Diagnosis not present

## 2019-01-03 DIAGNOSIS — L570 Actinic keratosis: Secondary | ICD-10-CM | POA: Diagnosis not present

## 2019-01-22 ENCOUNTER — Other Ambulatory Visit: Payer: Self-pay | Admitting: Nurse Practitioner

## 2019-01-22 DIAGNOSIS — I10 Essential (primary) hypertension: Secondary | ICD-10-CM

## 2019-01-25 ENCOUNTER — Other Ambulatory Visit: Payer: Self-pay

## 2019-01-28 ENCOUNTER — Ambulatory Visit (INDEPENDENT_AMBULATORY_CARE_PROVIDER_SITE_OTHER): Payer: Medicare Other | Admitting: Nurse Practitioner

## 2019-01-28 ENCOUNTER — Other Ambulatory Visit: Payer: Self-pay

## 2019-01-28 ENCOUNTER — Encounter: Payer: Self-pay | Admitting: Nurse Practitioner

## 2019-01-28 VITALS — BP 137/78 | HR 56 | Temp 97.5°F | Ht 68.0 in | Wt 178.0 lb

## 2019-01-28 DIAGNOSIS — I10 Essential (primary) hypertension: Secondary | ICD-10-CM

## 2019-01-28 DIAGNOSIS — I255 Ischemic cardiomyopathy: Secondary | ICD-10-CM

## 2019-01-28 DIAGNOSIS — E782 Mixed hyperlipidemia: Secondary | ICD-10-CM

## 2019-01-28 DIAGNOSIS — E8881 Metabolic syndrome: Secondary | ICD-10-CM | POA: Diagnosis not present

## 2019-01-28 DIAGNOSIS — Z6827 Body mass index (BMI) 27.0-27.9, adult: Secondary | ICD-10-CM

## 2019-01-28 DIAGNOSIS — F0390 Unspecified dementia without behavioral disturbance: Secondary | ICD-10-CM

## 2019-01-28 DIAGNOSIS — I2510 Atherosclerotic heart disease of native coronary artery without angina pectoris: Secondary | ICD-10-CM | POA: Diagnosis not present

## 2019-01-28 LAB — BAYER DCA HB A1C WAIVED: HB A1C (BAYER DCA - WAIVED): 5.6 % (ref <7.0)

## 2019-01-28 MED ORDER — METOPROLOL TARTRATE 25 MG PO TABS: 25.0000 mg | ORAL_TABLET | Freq: Two times a day (BID) | ORAL | 1 refills | Status: DC

## 2019-01-28 MED ORDER — PRASUGREL HCL 10 MG PO TABS: 10.0000 mg | ORAL_TABLET | Freq: Every day | ORAL | 1 refills | Status: DC

## 2019-01-28 MED ORDER — ATORVASTATIN CALCIUM 40 MG PO TABS
ORAL_TABLET | ORAL | 1 refills | Status: DC
Start: 2019-01-28 — End: 2019-07-30

## 2019-01-28 MED ORDER — LISINOPRIL 20 MG PO TABS: 20.0000 mg | ORAL_TABLET | Freq: Every day | ORAL | 1 refills | Status: DC

## 2019-01-28 NOTE — Progress Notes (Signed)
Subjective:    Patient ID: Adam Rowland, male    DOB: [birth year], 73 y.o.   MRN: [id number]   Chief Complaint: Medical Management of Chronic Issues    HPI:  1. Essential hypertension No c/o chest pain, SOB or headache. Does nit check blood pressure at home. BP Readings from Last 3 Encounters:  01/28/19 137/78  11/27/18 (!) 143/77  07/23/18 128/70     2. Mixed hyperlipidemia Avoids fatty foods. Rides exercise bike daily  3. Metabolic syndrome He does not check blood sugars at home. Last HGBA1c was 5.9  4. Coronary artery disease involving native coronary artery of native heart without angina pectoris Has not seen cardiology since Oct 2018. He says he is doing well and just has not had time to go.  5. Cardiomyopathy, ischemic Again has not seen cardiology since 2018  6. Dementia without behavioral disturbance, unspecified dementia type [facility]) Last saw neurology on 12/06/18. He had significant changes in cognitive ability from last testing. They made no chenges to meds. He is still able to drive to familiar places. His wife has taken over the finances.  7. BMI 27.0-27.9,adult No weight changes    Outpatient Encounter Medications as of 01/28/2019  Medication Sig  . aspirin EC 81 MG EC tablet Take 1 tablet (81 mg total) by mouth daily.  Marland Kitchen atorvastatin (LIPITOR) 40 MG tablet TAKE 1 TABLET ONCE DAILY AT BEDTIME  . donepezil (ARICEPT) 10 MG tablet Take 1 tablet (10 mg total) by mouth at bedtime.  Marland Kitchen lisinopril (ZESTRIL) 20 MG tablet TAKE 1 TABLET DAILY  . metoprolol tartrate (LOPRESSOR) 25 MG tablet Take 1 tablet (25 mg total) by mouth 2 (two) times daily.  . nitroGLYCERIN (NITROSTAT) 0.4 MG SL tablet Place 1 tablet (0.4 mg total) under the tongue every 5 (five) minutes x 3 doses as needed for chest pain.  . prasugrel (EFFIENT) 10 MG TABS tablet Take 1 tablet (10 mg total) by mouth daily.       New complaints: None today  Social history: Lives with wife. Is no longer  working     Review of Systems  Constitutional: Negative for activity change and appetite change.  HENT: Negative.   Eyes: Negative for pain.  Respiratory: Negative for shortness of breath.   Cardiovascular: Negative for chest pain, palpitations and leg swelling.  Gastrointestinal: Negative for abdominal pain.  Endocrine: Negative for polydipsia.  Genitourinary: Negative.   Skin: Negative for rash.  Neurological: Negative for dizziness, weakness and headaches.  Hematological: Does not bruise/bleed easily.  Psychiatric/Behavioral: Negative.   All other systems reviewed and are negative.      Objective:   Physical Exam Vitals signs and nursing note reviewed.  Constitutional:      Appearance: Normal appearance. He is well-developed.  HENT:     Head: Normocephalic.     Nose: Nose normal.  Eyes:     Pupils: Pupils are equal, round, and reactive to light.  Neck:     Musculoskeletal: Normal range of motion and neck supple.     Thyroid: No thyroid mass or thyromegaly.     Vascular: No carotid bruit or JVD.     Trachea: Phonation normal.  Cardiovascular:     Rate and Rhythm: Normal rate and regular rhythm.  Pulmonary:     Effort: Pulmonary effort is normal. No respiratory distress.     Breath sounds: Normal breath sounds.  Abdominal:     General: Bowel sounds are normal.     Palpations:  Abdomen is soft.     Tenderness: There is no abdominal tenderness.  Musculoskeletal: Normal range of motion.  Lymphadenopathy:     Cervical: No cervical adenopathy.  Skin:    General: Skin is warm and dry.  Neurological:     General: No focal deficit present.     Mental Status: He is alert and oriented to person, place, and time.     Cranial Nerves: No cranial nerve deficit.     Sensory: No sensory deficit.  Psychiatric:        Mood and Affect: Mood normal.        Behavior: Behavior normal.        Thought Content: Thought content normal.        Judgment: Judgment normal.      Comments: Answered all questions apropriatey    BP 137/78   Pulse (!) 56   Temp (!) 97.5 F (36.4 C) (Oral)   Ht 5' 8"  (1.727 m)   Wt 178 lb (80.7 kg)   BMI 27.06 kg/m   HGBA1c 5.6%       Assessment & Plan:  Adam Rowland comes in today with chief complaint of Medical Management of Chronic Issues   Diagnosis and orders addressed:  1. Essential hypertension Low sodium diet - CMP14+EGFR  2. Mixed hyperlipidemia Low fat diet Continue daily exercise - Lipid panel  3. Metabolic syndrome Watch carbs in diet - Bayer DCA Hb A1c Waived  4. Coronary artery disease involving native coronary artery of native heart without angina pectoris Need to ave follow up with cardiology  5. Cardiomyopathy, ischemic  6. Dementia without behavioral disturbance, unspecified dementia type (Adam Rowland) Continue daily orientation Computer games tfor cognitive ability  7. BMI 27.0-27.9,adult Discussed diet and exercise for person with BMI >25 Will recheck weight in 3-6 months  Labs pending Health Maintenance reviewed Diet and exercise encouraged  Follow up plan: 6 months   [name] Hassell Done, FNP

## 2019-01-29 LAB — CMP14+EGFR
ALT: 18 IU/L (ref 0–44)
AST: 15 IU/L (ref 0–40)
Albumin/Globulin Ratio: 2 (ref 1.2–2.2)
Albumin: 4.4 g/dL (ref 3.7–4.7)
Alkaline Phosphatase: 54 IU/L (ref 39–117)
BUN/Creatinine Ratio: 16 (ref 10–24)
BUN: 16 mg/dL (ref 8–27)
Bilirubin Total: 0.8 mg/dL (ref 0.0–1.2)
CO2: 21 mmol/L (ref 20–29)
Calcium: 9.6 mg/dL (ref 8.6–10.2)
Chloride: 106 mmol/L (ref 96–106)
Creatinine, Ser: 0.98 mg/dL (ref 0.76–1.27)
GFR calc Af Amer: 89 mL/min/{1.73_m2} (ref >59)
GFR calc non Af Amer: 77 mL/min/{1.73_m2} (ref >59)
Globulin, Total: 2.2 g/dL (ref 1.5–4.5)
Glucose: 90 mg/dL (ref 65–99)
Potassium: 5.1 mmol/L (ref 3.5–5.2)
Sodium: 143 mmol/L (ref 134–144)
Total Protein: 6.6 g/dL (ref 6.0–8.5)

## 2019-01-29 LAB — LIPID PANEL
Chol/HDL Ratio: 3 ratio (ref 0.0–5.0)
Cholesterol, Total: 108 mg/dL (ref 100–199)
HDL: 36 mg/dL — ABNORMAL LOW (ref >39)
LDL Calculated: 54 mg/dL (ref 0–99)
Triglycerides: 91 mg/dL (ref 0–149)
VLDL Cholesterol Cal: 18 mg/dL (ref 5–40)

## 2019-03-07 ENCOUNTER — Encounter: Payer: Self-pay | Admitting: Neurology

## 2019-03-07 ENCOUNTER — Ambulatory Visit: Payer: Medicare Other | Admitting: Neurology

## 2019-03-07 ENCOUNTER — Other Ambulatory Visit: Payer: Self-pay

## 2019-03-07 VITALS — BP 165/69 | HR 49 | Ht 69.0 in | Wt 175.0 lb

## 2019-03-07 DIAGNOSIS — F0390 Unspecified dementia without behavioral disturbance: Secondary | ICD-10-CM

## 2019-03-07 MED ORDER — MEMANTINE HCL 10 MG PO TABS: 5.0000 mg | ORAL_TABLET | Freq: Two times a day (BID) | ORAL | 5 refills | Status: DC

## 2019-03-07 NOTE — Progress Notes (Signed)
Subjective:    Patient ID: Adam Rowland is a 73 y.o. male.  HPI    Interim history:   Adam Rowland is a 73 year old right-handed Adam Rowland with an underlying medical history of coronary artery disease with history of non-STEMI, history of ischemic cardiomyopathy, hyperlipidemia, hypertension, kidney stones, nonsustained V. tach, and overweight state, who presents for follow-up consultation of Adam Rowland dementia.  The patient is accompanied by Adam Rowland wife again today.  We had a virtual phone call visit on 12/06/2018, at which time Adam Rowland felt fairly stable.  Adam Rowland was able to tolerate donepezil 10 mg daily.  Adam Rowland started this in June 2019.  We talked about potentially utilizing a second memory medication.   Today, 03/07/19: Please see below for documentation on the virtual visit.    Adam Rowland reports feeling fairly stable, Adam Rowland continues to tolerate the donepezil.  Adam Rowland has had no falls, Adam Rowland tries to stay active but Adam Rowland wife has noted that Adam Rowland is not as outgoing and active as Adam Rowland used to be.  Adam Rowland still works in the yard and feeds the animals in the morning, they have goats.  Adam Rowland seems to be less active in conversation.  She has noted that Adam Rowland has more word finding difficulties or delay in responding.  Adam Rowland seems to be just a little bit more sedentary in her perception.  Adam Rowland still drives, mostly locally.  Adam Rowland still sees Adam Rowland mother who lives about 30 minutes drive away and Adam Rowland manages the drive without problems, [place].  Adam Rowland uses the stationary bike every day for about 30 minutes.  She estimates that Adam Rowland drinks about 4 small bottles of water, 8 ounces each.  The patient's allergies, current medications, family history, past medical history, past social history, past surgical history and problem list were reviewed and updated as appropriate.    Previously:   I saw him on 02/13/2018, at which time Adam Rowland MMSE was 21. We talked about Adam Rowland neuropsychological test results from May 2019. I suggested we start him on Aricept 5 mg  strength with titration to 10 mg.   Adam Rowland saw Cecille Rubin, nurse practitioner in the interim on 06/06/2018, at which time Adam Rowland MMSE was 26. Adam Rowland was advised to continue with Aricept.   I first met him on 08/14/2017 at the request of Adam Rowland primary care nurse practitioner, at which time Adam Rowland reported short-term memory loss for the past 3-4 years, maybe 5 years. I suggested further workup in the form of neuropsychological evaluation and blood work as well as brain scan. Adam Rowland blood work was unremarkable with the exception of A1c in the prediabetes range. We checked thyroid function with TSH and B12 level as well. RPR was negative. Adam Rowland had a brain MRI with contrast on 08/18/2017 and I reviewed the results: IMPRESSION:  Slightly abnormal MRI scan of the brain showing mild degree of generalized cerebral atrophy. 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   Adam Rowland was called with the results.    Adam Rowland had interim neuropsychological evaluation on 01/01/2018 as well as a feedback appointment on 01/08/18 with Dr. Bonita Quin and I reviewed the results: << Clinical Impressions: Mild dementia, unspecified, without behavioral disturbance (rule out mixed vascular dementia and Alzheimer's disease).  Results of cognitive testing were abnormal and demonstrated significant impairment in several domains of cognitive function. Additionally, there is evidence that Adam Rowland cognitive deficits are interfering with Adam Rowland ability to perform complex  tasks such as managing appointments and finances. As such, diagnostic criteria for a dementia syndrome are met.  The patient's cognitive profile demonstrates both cortical and subcortical features. Consistent with cortical dysfunction, Adam Rowland demonstrated memory consolidation dysfunction and mildly impaired confrontation naming. Consistent with subcortical dysfunction, Adam Rowland demonstrated slowed processing speed, executive  dysfunction and impaired verbal fluency. This may represent a mixed dementia. It is certainly possible that Alzheimer's disease is present, but I also wonder about vascular contribution based on cardiac history and cognitive profile. Surprisingly, brain MRI report did not mention any small vessel disease or remote infarcts.  Overall, based on current level of functioning and test scores, I would characterize Adam Rowland dementia as mild stage. There is no evidence of behavioral disturbance. There also is no evidence of depression, anxiety or other primary psychiatric disorder.     Recommendations/Plan: Based on the findings of the present evaluation, the following recommendations are offered:   1. I recommend that if Adam Rowland is going to continue driving that Adam Rowland driving be limited to familiar locations within close geographic distance during daylight hours (no long distance driving, no driving by himself to new areas/places). 2. Adam Rowland wife is already managing the finances and appointments and reminding him to take Adam Rowland medications; this should be continued. 3. Adam Rowland family will likely benefit from education and support regarding mild dementia. They are referred to the [employer]. I provided them with a packet of written information as well. 4. Adam Rowland appears to be an appropriate candidate for cholinesterase inhibitor therapy, and Adam Rowland can follow up with Dr. Rexene Alberts about this. 5. Neuropsychological re-evaluation in 1-2 years could be considered in order to monitor cognitive status, track any progression of symptoms and further assist with treatment planning. >>   08/14/2017: (Adam Rowland) has had short term memory issues for the past 3-4 years, maybe even 5 years. Adam Rowland has noticed trouble focusing and also difficulty with numbers. 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 Adam Rowland has had some word finding difficulties.  Symptoms are not necessarily very progressive per patient. I reviewed your office  note from 06/06/2017.  Adam Rowland is retired and lives with Adam Rowland wife. They have one child. Adam Rowland quit smoking in 1989, drinks alcohol in the form of wine, usually 1 glass per day, caffeine in the form of coffee, 3 cups daily on average.  Adam Rowland has had trouble multitasking, sequencing, per wife.  Adam Rowland snores, but no apneas are noted. Adam Rowland has no FHx of dementia or AD. MGM lived to be 75, mother is 76. Father passed away at age 81 from bladder cancer. Patient has an older half sister who is in her early 68s. Adam Rowland has never had a brain MRI. Adam Rowland has never had one-sided weakness or numbness or slurring of speech. Adam Rowland does not always drink enough water Adam Rowland admits. Adam Rowland driving has been okay per patient and Adam Rowland wife.   Adam Rowland Past Medical History Is Significant For: Past Medical History:  Diagnosis Date  . CAD (coronary artery disease)    a. 05/2014 NSTEMI/PCI: LM nl, LAD 28m(3.0x23 Xience Alpine DES), D1 90, LCX 864mOM1 80, RCA 30p/d, RPDA 40-50p, EF 40-45% ->Plan for staged PCI of LCX/OM1.  . Cardiomyopathy, ischemic    a. 05/2014 Echo: Ef 45-50%, mid-apicalanteroseptal AK, Gr 2 DD.  . Marland Kitchenataract   . Fracture, ankle 06/12/2014  . Hyperlipidemia   . Hypertension   [facility]   non STEMI  . Nephrolithiasis 01/16/2013  .  NSVT (nonsustained ventricular tachycardia) ([place])    a. 05/2014 in setting of NSTEMI -  asymptomatic.    Adam Rowland Past Surgical History Is Significant For: Past Surgical History:  Procedure Laterality Date  . ANKLE FRACTURE SURGERY    . CARDIAC CATHETERIZATION    . CARDIAC CATHETERIZATION Right 06/20/2014   Procedure: CORONARY STENT INTERVENTION;  Surgeon: Peter M Martinique, MD;  Location: [facility] CATH LAB;  Service: Cardiovascular;  Laterality: Right;  . CATARACT EXTRACTION Bilateral   . CORONARY STENT PLACEMENT    . LEFT HEART CATHETERIZATION WITH CORONARY ANGIOGRAM N/A 06/20/2014   Procedure: LEFT HEART CATHETERIZATION WITH CORONARY ANGIOGRAM;  Surgeon: Peter M Martinique, MD;  Location:  [facility] CATH LAB;  Service: Cardiovascular;  Laterality: N/A;  . NASAL SEPTUM SURGERY    . VASECTOMY      Adam Rowland Family History Is Significant For: Family History  Problem Relation Age of Onset  . Hypertension Mother   . Cancer Father        bladder ca  . Hyperlipidemia Father   . Alcohol abuse Father   . Hyperlipidemia Sister   . Cancer Brother   . Nephrolithiasis Brother   . Colon cancer Neg Hx   . Stomach cancer Neg Hx   . Esophageal cancer Neg Hx     Adam Rowland Social History Is Significant For: Social History   Socioeconomic History  . Marital status: Married    Spouse name: Not on file  . Number of children: Not on file  . Years of education: Not on file  . Highest education level: Not on file  Occupational History  . Occupation: Retired  Scientific laboratory technician  . Financial resource strain: Not hard at all  . Food insecurity    Worry: Never true    Inability: Never true  . Transportation needs    Medical: No    Non-medical: No  Tobacco Use  . Smoking status: Former Smoker    Packs/day: 1.00    Years: 23.00    Pack years: 23.00    Types: Cigarettes    Start date: 10/26/1964    Quit date: 08/30/1987    Years since quitting: 31.5  . Smokeless tobacco: Former Systems developer    Types: Chew    Quit date: 02/27/1988  . Tobacco comment: quit in 1989, smoked since Adam Rowland was a teenager/chewed tobacco for about 6 months after Adam Rowland quit smoking cigarettes  Substance and Sexual Activity  . Alcohol use: Yes    Alcohol/week: 7.0 standard drinks    Types: 7 Glasses of wine per week    Comment: 1 glass of wine nightly   . Drug use: No  . Sexual activity: Yes  Lifestyle  . Physical activity    Days per week: 7 days    Minutes per session: 30 min  . Stress: Not at all  Relationships  . Social connections    Talks on phone: More than three times a week    Gets together: More than three times a week    Attends religious service: More than 4 times per year    Active member of club or organization: Yes     Attends meetings of clubs or organizations: More than 4 times per year    Relationship status: Married  Other Topics Concern  . 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 Adam Rowland has one son and one granddaughter.     Adam Rowland Allergies Are:  No Known Allergies:  Adam Rowland Current Medications Are:  Outpatient Encounter Medications as of 03/07/2019  Medication Sig  . aspirin EC 81 MG EC tablet Take 1 tablet (81 mg total) by mouth daily.  Marland Kitchen atorvastatin (LIPITOR) 40 MG tablet TAKE 1 TABLET ONCE DAILY AT BEDTIME  . donepezil (ARICEPT) 10 MG tablet Take 1 tablet (10 mg total) by mouth at bedtime.  Marland Kitchen lisinopril (ZESTRIL) 20 MG tablet Take 1 tablet (20 mg total) by mouth daily.  . metoprolol tartrate (LOPRESSOR) 25 MG tablet Take 1 tablet (25 mg total) by mouth 2 (two) times daily.  . nitroGLYCERIN (NITROSTAT) 0.4 MG SL tablet Place 1 tablet (0.4 mg total) under the tongue every 5 (five) minutes x 3 doses as needed for chest pain.  . prasugrel (EFFIENT) 10 MG TABS tablet Take 1 tablet (10 mg total) by mouth daily.  . memantine (NAMENDA) 10 MG tablet Take 0.5 tablets (5 mg total) by mouth 2 (two) times daily.   No facility-administered encounter medications on file as of 03/07/2019.   :  Review of Systems:  Out of a complete 14 point review of systems, all are reviewed and negative with the exception of these symptoms as listed below:  Review of Systems  Neurological:       Pt presents today to follow up on Adam Rowland memory.    Objective:  Neurological Exam  Physical Exam Physical Examination:   Vitals:   03/07/19 1056  BP: (!) 165/69  Pulse: (!) 49    General Examination: The patient is a very pleasant 73 y.o. male in no acute distress. Adam Rowland appears well-developed and well-nourished and well groomed. Slightly anxious appearing.  HEENT:Normocephalic, atraumatic, pupils are equal, round and reactive to light and accommodation. Adam Rowland wears corrective eyeglasses. Adam Rowland  is status post bilateral cataract repairs (and s/p retinal surgery on the left).Extraocular tracking is good without limitation to gaze excursion or nystagmus noted. Normal smooth pursuit is noted. Hearing is grossly intact. Face is symmetric with normal facial animation and normal facial sensation. Speech is clear with no dysarthria noted. There is no hypophonia. There is no lip, neck/head, jaw or voice tremor. Neck is supple with full range of passive and active motion. Oropharynx exam reveals:mild to moderate mouth dryness, adequatedental hygiene andno significant airway crowding.   Chest:Clear to auscultation without wheezing, rhonchi or crackles noted.  Heart:S1+S2+0, regular and normal without murmurs, rubs or gallops noted.   Abdomen:Soft, non-tender and non-distended with normal bowel sounds appreciated on auscultation.  Extremities:There isnopitting edema in the distal lower extremities bilaterally.   Skin: Warm and dry without trophic changes noted.  Musculoskeletal: exam reveals no obvious joint deformities, tenderness or joint swelling or erythema,except left ankle wider, hardware in place, Left Slightly larger in caliber than right.   Neurologically:  Mental status: The patient is awake, alert and oriented in all 4 spheres.Hisimmediate and remote memory, attention, language skills and fund of knowledge are fairlyappropriate. There is no evidence of aphasia, agnosia, apraxia or anomia. Speech is clear with normal prosody and enunciation. Thought process is linear. Mood isnormaland affect is normal.  On12/17/2018: MMSE: 27/30, CDT: 3/4, AFT: 5/min.  On 02/13/2018: MMSE: 21/30, CDT: 1/4, AFT: 5/min.  On 03/07/2019: MMSE: 23/30, CDT: 3/4, AFT: 3/min.  Cranial nerves II - XII are as described above under HEENT exam. In addition: shoulder shrug is normal with equal shoulder height noted. Motor exam: Normal bulk, strength and tone is noted. There is no drift,  tremor or rebound. Romberg is negative. Reflexes are 1+  throughout, absent in the L ankle. Fine motor skills and coordination: intact with normal finger taps, normal hand movements, normal rapid alternating patting, normal foot taps and normal foot agility.  Cerebellar testing: No dysmetria or intention tremor. There is no truncal or gait ataxia.  Sensory exam: intact to light touch in the upper and lower extremities.  Gait, station and balance:Hestands easily. No veering to one side is noted. No leaning to one side is noted. Posture is age-appropriate and stance is narrow based. Gait showsnormalstride length and normalpace. No problems turning are noted.   Assessmentand Plan:   In summary,Adam Rowland a very pleasant 48 year oldmalewith an underlying medical history of coronary artery disease with history of non-STEMI, history of ischemic cardiomyopathy, hyperlipidemia, hypertension, kidney stones, nonsustained V. tach, and overweight state, whopresents for follow up consultation of Adam Rowland memory loss. Adam Rowland reported a 3-4 year history of forgetfulness, difficulty sequencing, difficulty multitasking, difficulty with word finding and with numbers when we first met in Dec 2018. Adam Rowland has had interim w/u in the form of blood work, which was benign, MRI brain wo contrast in 07/2017, which showed fairly age-appropriate findings and neuropsych testing, which was supportive of a mixed dementia Dx. Adam Rowland does not have a telltale family history of Alzheimer's disease or dementia in general. Adam Rowland memory scores have declined with time. Adam Rowland certainly does have vascular risk factors. I suggested we proceed with a 2nd dementia medication at this time in the form of generic Namenda 10 mg 1/2 bid with the Hope to increase it to 10 mg twice daily at the next visit in about 3 months.  Adam Rowland is advised to continue with generic Aricept, 10 mg strength, 1 pill qHS. I would for him to return in 3 months to see one of our  NPs. We again talked about Adam Rowland driving and Adam Rowland is advised to monitor Adam Rowland driving skills with the help of Adam Rowland family and limit himself. Adam Rowland is advised to stay active mentally and physically, pursue a healthy lifestyle, good nutrition, good hydration, sent bedtime and rise time routine. Adam Rowland is advised to stay active socially as well. I answered all their questions today and the patient and Adam Rowland wife are in agreement.  I spent 25 minutes in total face-to-face time with the patient, more than 50% of which was spent in counseling and coordination of care, reviewing test results, reviewing medication and discussing or reviewing the diagnosis of dementia, its prognosis and treatment options. Pertinent laboratory and imaging test results that were available during this visit with the patient were reviewed by me and considered in my medical decision making (see chart for details).

## 2019-03-07 NOTE — Patient Instructions (Signed)
Please continue with donepezil 10 mg daily.  As discussed, we will start you on Namenda (generic name: Memantine), starting at 5 mg twice daily with gradual buildup to 10 mg twice daily. Please note that side effects may include, but are not limited to: headache, nausea, confusion, hallucination, personality changes. If you are having mild side effects, try to stick with the treatment as these initial side effects may go away after the first 10-14 days.    Please follow up in 3 months with the NP.

## 2019-03-21 DIAGNOSIS — H26491 Other secondary cataract, right eye: Secondary | ICD-10-CM | POA: Diagnosis not present

## 2019-04-12 DIAGNOSIS — Z961 Presence of intraocular lens: Secondary | ICD-10-CM | POA: Diagnosis not present

## 2019-04-12 DIAGNOSIS — H18413 Arcus senilis, bilateral: Secondary | ICD-10-CM | POA: Diagnosis not present

## 2019-04-12 DIAGNOSIS — H35373 Puckering of macula, bilateral: Secondary | ICD-10-CM | POA: Diagnosis not present

## 2019-04-12 DIAGNOSIS — H26491 Other secondary cataract, right eye: Secondary | ICD-10-CM | POA: Diagnosis not present

## 2019-06-10 ENCOUNTER — Ambulatory Visit (INDEPENDENT_AMBULATORY_CARE_PROVIDER_SITE_OTHER): Payer: Medicare Other | Admitting: *Deleted

## 2019-06-10 DIAGNOSIS — Z0000 Encounter for general adult medical examination without abnormal findings: Secondary | ICD-10-CM | POA: Diagnosis not present

## 2019-06-10 NOTE — Progress Notes (Signed)
MEDICARE ANNUAL WELLNESS VISIT  06/10/2019  Telephone Visit Disclaimer This Medicare AWV was conducted by telephone due to national recommendations for restrictions regarding the COVID-19 Pandemic (e.g. social distancing).  I verified, using two identifiers, that I am speaking with Adam Rowland or their authorized healthcare agent. I discussed the limitations, risks, security, and privacy concerns of performing an evaluation and management service by telephone and the potential availability of an in-person appointment in the future. The patient expressed understanding and agreed to proceed.   Subjective:  Adam Rowland is a 73 y.o. male patient of Bennie Pierini, FNP who had a Medicare Annual Wellness Visit today via telephone. Adam Rowland is Retired and lives with their spouse. he has 1 child. he reports that he is socially active and does interact with friends/family regularly. he is moderately physically active and enjoys taking care of his pet goats.  Patient Care Team: Bennie Pierini, FNP as PCP - General (Nurse Practitioner) Tora Duck, PA-C as Consulting Physician (Physician Assistant) Laqueta [place], MD as Consulting Physician (Cardiology) Toni Arthurs, MD as Consulting Physician (Orthopedic Surgery) Derryl Harbor, OD as Consulting Physician (Optometry) Stephannie Li, MD as Consulting Physician (Ophthalmology)  Advanced Directives 06/10/2019 06/07/2018 06/06/2017 06/02/2016 05/27/2015 08/07/2014 07/21/2014  Does Patient Have a Medical Advance Directive? Yes Yes Yes Yes Yes Yes Yes  Type of Estate agent of [place];Living will Living will;Healthcare Power of Attorney Living will Healthcare Power of [place];Living will Healthcare Power of [place];Living will Healthcare Power of [place];Living will -  Does patient want to make changes to medical advance directive? No - Patient declined No - Patient declined No - Patient declined No - Patient  declined No - Patient declined No - Patient declined -  Copy of Healthcare Power of Attorney in Chart? No - copy requested No - copy requested No - copy requested No - copy requested No - copy requested No - copy requested No - copy requested  Would patient like information on creating a medical advance directive? - - - - - [facility] Utilization Over the Past 12 Months: # of hospitalizations or ER visits: 0 # of surgeries: 0  Review of Systems    Patient reports that his overall health is unchanged compared to last year.  History obtained from chart review  Patient Reported Readings (BP, Pulse, CBG, Weight, etc) none  Pain Assessment Pain : No/denies pain     Current Medications & Allergies (verified) Allergies as of 06/10/2019   No Known Allergies     Medication List       Accurate as of June 10, 2019  9:00 AM. If you have any questions, ask your nurse or doctor.        aspirin 81 MG EC tablet Take 1 tablet (81 mg total) by mouth daily.   atorvastatin 40 MG tablet Commonly known as: LIPITOR TAKE 1 TABLET ONCE DAILY AT BEDTIME   donepezil 10 MG tablet Commonly known as: ARICEPT Take 1 tablet (10 mg total) by mouth at bedtime.   lisinopril 20 MG tablet Commonly known as: ZESTRIL Take 1 tablet (20 mg total) by mouth daily.   memantine 10 MG tablet Commonly known as: Namenda Take 0.5 tablets (5 mg total) by mouth 2 (two) times daily.   metoprolol tartrate 25 MG tablet Commonly known as: LOPRESSOR Take 1 tablet (25 mg total) by mouth 2 (two) times daily.   nitroGLYCERIN 0.4 MG SL tablet Commonly known  as: NITROSTAT Place 1 tablet (0.4 mg total) under the tongue every 5 (five) minutes x 3 doses as needed for chest pain.   prasugrel 10 MG Tabs tablet Commonly known as: EFFIENT Take 1 tablet (10 mg total) by mouth daily.       History (reviewed): Past Medical History:  Diagnosis Date  . CAD (coronary artery disease)    a. 05/2014 NSTEMI/PCI:  LM nl, LAD 84m (3.0x23 Xience Alpine DES), D1 90, LCX 43m, OM1 80, RCA 30p/d, RPDA 40-50p, EF 40-45% ->Plan for staged PCI of LCX/OM1.  . Cardiomyopathy, ischemic    a. 05/2014 Echo: Ef 45-50%, mid-apicalanteroseptal AK, Gr 2 DD.  Marland Kitchen Cataract   . Fracture, ankle 06/12/2014  . Hyperlipidemia   . Hypertension   . Myocardial infarction [facility]) 2015   non STEMI  . Nephrolithiasis 01/16/2013  . NSVT (nonsustained ventricular tachycardia) ([place])    a. 05/2014 in setting of NSTEMI -  asymptomatic.   Past Surgical History:  Procedure Laterality Date  . ANKLE FRACTURE SURGERY    . CARDIAC CATHETERIZATION    . CARDIAC CATHETERIZATION Right 06/20/2014   Procedure: CORONARY STENT INTERVENTION;  Surgeon: Peter M Martinique, MD;  Location: [facility] CATH LAB;  Service: Cardiovascular;  Laterality: Right;  . CATARACT EXTRACTION Bilateral   . CORONARY STENT PLACEMENT    . LEFT HEART CATHETERIZATION WITH CORONARY ANGIOGRAM N/A 06/20/2014   Procedure: LEFT HEART CATHETERIZATION WITH CORONARY ANGIOGRAM;  Surgeon: Peter M Martinique, MD;  [facility] CATH LAB;  Service: Cardiovascular;  Laterality: N/A;  . NASAL SEPTUM SURGERY    . VASECTOMY     Family History  Problem Relation Age of Onset  . Hypertension Mother   . Cancer Father        bladder ca  . Hyperlipidemia Father   . Alcohol abuse Father   . Hyperlipidemia Sister   . Cancer Brother   . Nephrolithiasis Brother   . Colon cancer Neg Hx   . Stomach cancer Neg Hx   . Esophageal cancer Neg Hx    Social History   Socioeconomic History  . Marital status: Married    Spouse name: [name]  . Number of children: 1  . Years of education: 16  . Highest education level: Bachelor's degree (e.g., BA, AB, BS)  Occupational History  . Occupation: Retired  Scientific laboratory technician  . Financial resource strain: Not hard at all  . Food insecurity    Worry: Never true    Inability: Never true  . Transportation needs    Medical: No    Non-medical: No  Tobacco Use  . Smoking  status: Former Smoker    Packs/day: 1.00    Years: 23.00    Pack years: 23.00    Types: Cigarettes    Start date: 10/26/1964    Quit date: 08/30/1987    Years since quitting: 31.8  . Smokeless tobacco: Former Systems developer    Types: Chew    Quit date: 02/27/1988  . Tobacco comment: quit in 1989, smoked since he was a teenager/chewed tobacco for about 6 months after he quit smoking cigarettes  Substance and Sexual Activity  . Alcohol use: Yes    Alcohol/week: 7.0 standard drinks    Types: 7 Glasses of wine per week    Comment: 1 glass of wine nightly   . Drug use: No  . Sexual activity: Not Currently  Lifestyle  . Physical activity    Days per week: 7 days    Minutes per session: 30  min  . Stress: Not at all  Relationships  . Social connections    Talks on phone: More than three times a week    Gets together: More than three times a week    Attends religious service: More than 4 times per year    Active member of club or organization: Yes    Attends meetings of clubs or organizations: More than 4 times per year    Relationship status: Married  Other Topics Concern  . 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 He has one son and one granddaughter.     Activities of Daily Living In your present state of health, do you have any difficulty performing the following activities: 06/10/2019  Hearing? N  Vision? N  Comment wears glasses-gets yearly eye exam  Difficulty concentrating or making decisions? Y  Comment sometimes-pt takes Namenda and Aricept-follows up with neurology for Dementia  Walking or climbing stairs? N  Dressing or bathing? N  Doing errands, shopping? N  Preparing Food and eating ? N  Using the Toilet? N  In the past six months, have you accidently leaked urine? N  Do you have problems with loss of bowel control? N  Managing your Medications? N  Managing your Finances? N  Housekeeping or managing your Housekeeping? N  Some  recent data might be hidden    Patient Education/ Literacy How often do you need to have someone help you when you read instructions, pamphlets, or other written materials from your doctor or pharmacy?: 1 - Never What is the last grade level you completed in school?: Bachelors Degree  Exercise Current Exercise Habits: Home exercise routine, Type of exercise: Other - see comments(stationary bike), Time (Minutes): 30, Frequency (Times/Week): 7, Weekly Exercise (Minutes/Week): 210, Intensity: Moderate, Exercise limited by: cardiac condition(s)  Diet Patient reports consuming 3 meals a day and 2 snack(s) a day Patient reports that his primary diet is: Regular Patient reports that she does have regular access to food.   Depression Screen PHQ 2/9 Scores 06/10/2019 01/28/2019 07/23/2018 06/07/2018 01/04/2018 06/06/2017 03/10/2017  PHQ - 2 Score 0 0 0 0 0 0 0     Fall Risk Fall Risk  06/10/2019 01/28/2019 07/23/2018 06/07/2018 01/04/2018  Falls in the past year? 0 0 0 Yes No  Number falls in past yr: 0 - - 2 or more -  Injury with Fall? 0 - - No -  Comment - - - - -  Risk Factor Category  - - - High Fall Risk -  Risk for fall due to : - - - History of fall(s);Impaired balance/gait;Medication side effect -  Follow up Falls prevention discussed - - - -  Comment Get rid of all throw rugs in the house, adequate lighting in the walkways and grab bars in the bathroom. - - - -     Objective:  Adam Rowland seemed alert and oriented and he participated appropriately during our telephone visit.  Blood Pressure Weight BMI  BP Readings from Last 3 Encounters:  03/07/19 (!) 165/69  01/28/19 137/78  11/27/18 (!) 143/77   Wt Readings from Last 3 Encounters:  03/07/19 175 lb (79.4 kg)  01/28/19 178 lb (80.7 kg)  11/27/18 181 lb (82.1 kg)   BMI Readings from Last 1 Encounters:  03/07/19 25.84 kg/m    *Unable to obtain current vital signs, weight, and BMI due to telephone visit type  Hearing/Vision  .  Adam Rowland  did not seem to have difficulty with hearing/understanding during the telephone conversation . Reports that he has had a formal eye exam by an eye care professional within the past year . Reports that he has not had a formal hearing evaluation within the past year *Unable to fully assess hearing and vision during telephone visit type  Cognitive Function: 6CIT Screen 06/10/2019  What Year? 0 points  What month? 0 points  What time? 0 points  Count back from 20 0 points  Months in reverse 0 points  Repeat phrase 0 points  Total Score 0   (Normal:0-7, Significant for Dysfunction: >8)  Normal Cognitive Function Screening: Yes   Immunization & Health Maintenance Record Immunization History  Administered Date(s) Administered  . Influenza Split 10/08/2012, 07/09/2013  . Influenza, High Dose Seasonal PF 06/02/2016, 07/05/2017, 06/13/2018  . Influenza,inj,Quad PF,6+ Mos 06/21/2014, 05/27/2015  . Pneumococcal Conjugate-13 02/16/2015  . Pneumococcal Polysaccharide-23 12/12/2012  . Tdap 12/12/2012  . Zoster 10/08/2012  . Zoster Recombinat (Shingrix) 06/13/2018    Health Maintenance  Topic Date Due  . INFLUENZA VACCINE  03/30/2019  . COLONOSCOPY  11/30/2022  . TETANUS/TDAP  12/13/2022  . Hepatitis C Screening  Completed  . PNA vac Low Risk Adult  Completed       Assessment  This is a routine wellness examination for Adam Rowland.  Health Maintenance: Due or Overdue Health Maintenance Due  Topic Date Due  . INFLUENZA VACCINE  03/30/2019    Adam Rowland does not need a referral for Community Assistance: Care Management:   no Social Work:    no Prescription Assistance:  no Nutrition/Diabetes Education:  no   Plan:  Personalized Goals Goals Addressed            This Visit's Progress   . DIET - INCREASE WATER INTAKE       Try to drink 6-8 glasses of water daily.      Personalized Health Maintenance & Screening Recommendations  Influenza vaccine  Lung Cancer  Screening Recommended: no (Low Dose CT Chest recommended if Age 73-80 years, 30 pack-year currently smoking OR have quit w/in past 15 years) Hepatitis C Screening recommended: no HIV Screening recommended: no  Advanced Directives: Written information was not prepared per patient's request.  Referrals & Orders No orders of the defined types were placed in this encounter.   Follow-up Plan . Follow-up with Bennie PieriniMartin, [name], FNP as planned . Keep your appointment with the Triage nurse to get your Flu vaccine . Bring a copy of your Advanced Directives in for our records   I have personally reviewed and noted the following in the patient's chart:   . Medical and social history . Use of alcohol, tobacco or illicit drugs  . Current medications and supplements . Functional ability and status . Nutritional status . Physical activity . Advanced directives . List of other physicians . Hospitalizations, surgeries, and ER visits in previous 12 months . Vitals . Screenings to include cognitive, depression, and falls . Referrals and appointments  In addition, I have reviewed and discussed with Adam Rowland certain preventive protocols, quality metrics, and best practice recommendations. A written personalized care plan for preventive services as well as general preventive health recommendations is available and can be mailed to the patient at his request.      Hessie DienerSouthern, Karla G, LPN  16/10/960410/07/2019

## 2019-06-10 NOTE — Patient Instructions (Signed)
Preventive Care 72 Years and Older, Male Preventive care refers to lifestyle choices and visits with your health care provider that can promote health and wellness. This includes:  A yearly physical exam. This is also called an annual well check.  Regular dental and eye exams.  Immunizations.  Screening for certain conditions.  Healthy lifestyle choices, such as diet and exercise. What can I expect for my preventive care visit? Physical exam Your health care provider will check:  Height and weight. These may be used to calculate body mass index (BMI), which is a measurement that tells if you are at a healthy weight.  Heart rate and blood pressure.  Your skin for abnormal spots. Counseling Your health care provider may ask you questions about:  Alcohol, tobacco, and drug use.  Emotional well-being.  Home and relationship well-being.  Sexual activity.  Eating habits.  History of falls.  Memory and ability to understand (cognition).  Work and work Statistician. What immunizations do I need?  Influenza (flu) vaccine  This is recommended every year. Tetanus, diphtheria, and pertussis (Tdap) vaccine  You may need a Td booster every 10 years. Varicella (chickenpox) vaccine  You may need this vaccine if you have not already been vaccinated. Zoster (shingles) vaccine  You may need this after age 50. Pneumococcal conjugate (PCV13) vaccine  One dose is recommended after age 24. Pneumococcal polysaccharide (PPSV23) vaccine  One dose is recommended after age 33. Measles, mumps, and rubella (MMR) vaccine  You may need at least one dose of MMR if you were born in 1957 or later. You may also need a second dose. Meningococcal conjugate (MenACWY) vaccine  You may need this if you have certain conditions. Hepatitis A vaccine  You may need this if you have certain conditions or if you travel or work in places where you may be exposed to hepatitis A. Hepatitis B vaccine   You may need this if you have certain conditions or if you travel or work in places where you may be exposed to hepatitis B. Haemophilus influenzae type b (Hib) vaccine  You may need this if you have certain conditions. You may receive vaccines as individual doses or as more than one vaccine together in one shot (combination vaccines). Talk with your health care provider about the risks and benefits of combination vaccines. What tests do I need? Blood tests  Lipid and cholesterol levels. These may be checked every 5 years, or more frequently depending on your overall health.  Hepatitis C test.  Hepatitis B test. Screening  Lung cancer screening. You may have this screening every year starting at age 72 if you have a 30-pack-year history of smoking and currently smoke or have quit within the past 15 years.  Colorectal cancer screening. All adults should have this screening starting at age 72 and continuing until age 54. Your health care provider may recommend screening at age 72 if you are at increased risk. You will have tests every 1-10 years, depending on your results and the type of screening test.  Prostate cancer screening. Recommendations will vary depending on your family history and other risks.  Diabetes screening. This is done by checking your blood sugar (glucose) after you have not eaten for a while (fasting). You may have this done every 1-3 years.  Abdominal aortic aneurysm (AAA) screening. You may need this if you are a current or former smoker.  Sexually transmitted disease (STD) testing. Follow these instructions at home: Eating and drinking  Eat  a diet that includes fresh fruits and vegetables, whole grains, lean protein, and low-fat dairy products. Limit your intake of foods with high amounts of sugar, saturated fats, and salt.  Take vitamin and mineral supplements as recommended by your health care provider.  Do not drink alcohol if your health care provider  tells you not to drink.  If you drink alcohol: ? Limit how much you have to 0-2 drinks a day. ? Be aware of how much alcohol is in your drink. In the [place] one drink equals one 12 oz bottle of beer (355 mL), one 5 oz glass of wine (148 mL), or one 1 oz glass of hard liquor (44 mL). Lifestyle  Take daily care of your teeth and gums.  Stay active. Exercise for at least 30 minutes on 5 or more days each week.  Do not use any products that contain nicotine or tobacco, such as cigarettes, e-cigarettes, and chewing tobacco. If you need help quitting, ask your health care provider.  If you are sexually active, practice safe sex. Use a condom or other form of protection to prevent STIs (sexually transmitted infections).  Talk with your health care provider about taking a low-dose aspirin or statin. What's next?  Visit your health care provider once a year for a well check visit.  Ask your health care provider how often you should have your eyes and teeth checked.  Stay up to date on all vaccines. This information is not intended to replace advice given to you by your health care provider. Make sure you discuss any questions you have with your health care provider. Document Released: 09/11/2015 Document Revised: 08/09/2018 Document Reviewed: 08/09/2018 Elsevier Patient Education  2020 [employer]  

## 2019-06-17 ENCOUNTER — Ambulatory Visit: Payer: Medicare Other | Admitting: Adult Health

## 2019-06-19 ENCOUNTER — Ambulatory Visit: Payer: Medicare Other | Admitting: Family Medicine

## 2019-06-20 ENCOUNTER — Ambulatory Visit (INDEPENDENT_AMBULATORY_CARE_PROVIDER_SITE_OTHER): Payer: Medicare Other

## 2019-06-20 ENCOUNTER — Other Ambulatory Visit: Payer: Self-pay

## 2019-06-20 DIAGNOSIS — Z23 Encounter for immunization: Secondary | ICD-10-CM

## 2019-07-01 ENCOUNTER — Ambulatory Visit: Payer: Medicare Other | Admitting: Family Medicine

## 2019-07-01 ENCOUNTER — Other Ambulatory Visit: Payer: Self-pay

## 2019-07-01 ENCOUNTER — Encounter: Payer: Self-pay | Admitting: Family Medicine

## 2019-07-01 VITALS — BP 128/74 | HR 87 | Temp 97.1°F | Ht 69.0 in | Wt 173.4 lb

## 2019-07-01 DIAGNOSIS — F0150 Vascular dementia without behavioral disturbance: Secondary | ICD-10-CM

## 2019-07-01 DIAGNOSIS — F0280 Dementia in other diseases classified elsewhere without behavioral disturbance: Secondary | ICD-10-CM

## 2019-07-01 DIAGNOSIS — F0390 Unspecified dementia without behavioral disturbance: Secondary | ICD-10-CM

## 2019-07-01 DIAGNOSIS — G309 Alzheimer's disease, unspecified: Secondary | ICD-10-CM

## 2019-07-01 MED ORDER — DONEPEZIL HCL 10 MG PO TABS: 10.0000 mg | ORAL_TABLET | Freq: Every day | ORAL | 3 refills | Status: DC

## 2019-07-01 MED ORDER — MEMANTINE HCL 10 MG PO TABS: 10.0000 mg | ORAL_TABLET | Freq: Two times a day (BID) | ORAL | 3 refills | Status: DC

## 2019-07-01 NOTE — Patient Instructions (Signed)
We will increase Namenda to 10mg  twice daily. Continue Aricept 10mg  daily  Continue daily exercise and mental activity  Consider researching the MIND diet  Follow up in 6 months   Dementia Caregiver Guide Dementia is a term used to describe a number of symptoms that affect memory and thinking. The most common symptoms include:  Memory loss.  Trouble with language and communication.  Trouble concentrating.  Poor judgment.  Problems with reasoning.  Child-like behavior and language.  Extreme anxiety.  Angry outbursts.  Wandering from home or public places. Dementia usually gets worse slowly over time. In the early stages, people with dementia can stay independent and safe with some help. In later stages, they need help with daily tasks such as dressing, grooming, and using the bathroom. How to help the person with dementia cope Dementia can be frightening and confusing. Here are some tips to help the person with dementia cope with changes caused by the disease. General tips  Keep the person on track with his or her routine.  Try to identify areas where the person may need help.  Be supportive, patient, calm, and encouraging.  Gently remind the person that adjusting to changes takes time.  Help with the tasks that the person has asked for help with.  Keep the person involved in daily tasks and decisions as much as possible.  Encourage conversation, but try not to get frustrated or harried if the person struggles to find words or does not seem to appreciate your help. Communication tips  When the person is talking or seems frustrated, make eye contact and hold the person's hand.  Ask specific questions that need yes or no answers.  Use simple words, short sentences, and a calm voice. Only give one direction at a time.  When offering choices, limit them to just 1 or 2.  Avoid correcting the person in a negative way.  If the person is struggling to find the right  words, gently try to help him or her. How to recognize symptoms of stress Symptoms of stress in caregivers include:  Feeling frustrated or angry with the person with dementia.  Denying that the person has dementia or that his or her symptoms will not improve.  Feeling hopeless and unappreciated.  Difficulty sleeping.  Difficulty concentrating.  Feeling anxious, irritable, or depressed.  Developing stress-related health problems.  Feeling like you have too little time for your own life. Follow these instructions at home:   Make sure that you and the person you are caring for: ? Get regular sleep. ? Exercise regularly. ? Eat regular, nutritious meals. ? Drink enough fluid to keep your urine clear or pale yellow. ? Take over-the-counter and prescription medicines only as told by your health care providers. ? Attend all scheduled health care appointments.  Join a support group with others who are caregivers.  Ask about respite care resources so that you can have a regular break from the stress of caregiving.  Look for signs of stress in yourself and in the person you are caring for. If you notice signs of stress, take steps to manage it.  Consider any safety risks and take steps to avoid them.  Organize medications in a pill box for each day of the week.  Create a plan to handle any legal or financial matters. Get legal or financial advice if needed.  Keep a calendar in a central location to remind the person of appointments or other activities. Tips for reducing the risk of  injury  Keep floors clear of clutter. Remove rugs, magazine racks, and floor lamps.  Keep hallways well lit, especially at night.  Put a handrail and nonslip mat in the bathtub or shower.  Put childproof locks on cabinets that contain dangerous items, such as medicines, alcohol, guns, toxic cleaning items, sharp tools or utensils, matches, and lighters.  Put the locks in places where the person  cannot see or reach them easily. This will help ensure that the person does not wander out of the house and get lost.  Be prepared for emergencies. Keep a list of emergency phone numbers and addresses in a convenient area.  Remove car keys and lock garage doors so that the person does not try to get in the car and drive.  Have the person wear a bracelet that tracks locations and identifies the person as having memory problems. This should be worn at all times for safety. Where to find support: Many individuals and organizations offer support. These include:  Support groups for people with dementia and for caregivers.  Counselors or therapists.  Home health care services.  Adult day care centers. Where to find more information Alzheimer's Association: LimitLaws.hu Contact a health care provider if:  The person's health is rapidly getting worse.  You are no longer able to care for the person.  Caring for the person is affecting your physical and emotional health.  The person threatens himself or herself, you, or anyone else. Summary  Dementia is a term used to describe a number of symptoms that affect memory and thinking.  Dementia usually gets worse slowly over time.  Take steps to reduce the person's risk of injury, and to plan for future care.  Caregivers need support, relief from caregiving, and time for their own lives. This information is not intended to replace advice given to you by your health care provider. Make sure you discuss any questions you have with your health care provider. Document Released: 07/19/2016 Document Revised: 07/28/2017 Document Reviewed: 07/19/2016 Elsevier Patient Education  2020 [employer]  Dementia Dementia is a condition that affects the way the brain functions. It often affects memory and thinking. Usually, dementia gets worse with time and cannot be reversed (progressive dementia). There are many types of dementia,  including:  Alzheimer's disease. This type is the most common.  Vascular dementia. This type may happen as the result of a stroke.  Lewy body dementia. This type may happen to people who have Parkinson's disease.  Frontotemporal dementia. This type is caused by damage to nerve cells (neurons) in certain parts of the brain. Some people may be affected by more than one type of dementia. This is called mixed dementia. What are the causes? Dementia is caused by damage to cells in the brain. The area of the brain and the types of cells damaged determine the type of dementia. Usually, this damage is irreversible or cannot be undone. Some examples of irreversible causes include:  Conditions that affect the blood vessels of the brain, such as diabetes, heart disease, or blood vessel disease.  Genetic mutations. In some cases, changes in the brain may be caused by another condition and can be reversed or slowed. Some examples of reversible causes include:  Injury to the brain.  Certain medicines.  Infection, such as meningitis.  Metabolic problems, such as vitamin B12 deficiency or thyroid disease.  Pressure on the brain, such as from a tumor or blood clot. What are the signs or symptoms? Symptoms of dementia  depend on the type of dementia. Common signs of dementia include problems with remembering, thinking, problem solving, decision making, and communicating. These signs develop slowly or get worse with time. This may include:  Problems remembering things.  Having trouble taking a bath or putting clothes on.  Forgetting appointments.  Forgetting to pay bills.  Difficulty planning and preparing meals.  Having trouble speaking.  Getting lost easily. How is this diagnosed? This condition is diagnosed by a specialist (neurologist). It is diagnosed based on the history of your symptoms, your medical history, a physical exam, and tests. Tests may include:  Tests to evaluate brain  function, such as memory tests, cognitive tests, and other tests.  Lab tests, such as blood or urine tests.  Imaging tests, such as a CT scan, a PET scan, or an MRI.  Genetic testing. This may be done if other family members have a diagnosis of certain types of dementia. Your health care provider will talk with you and your family, friends, or caregivers about your history and symptoms. How is this treated?  Treatment for this condition depends on the cause of the dementia. Progressive dementias, such as Alzheimer's disease, cannot be cured, but there may be treatments that help to manage symptoms. Treatment might involve taking medicines that may help to:  Control the dementia.  Slow down the progression of the dementia.  Manage symptoms. In some cases, treating the cause of your dementia can improve symptoms, reverse symptoms, or slow down how quickly your dementia becomes worse. Your health care provider can direct you to support groups, organizations, and other health care providers who can help with decisions about your care. Follow these instructions at home: Medicines  Take over-the-counter and prescription medicines only as told by your health care provider.  Use a pill organizer or pill reminder to help you manage your medicines.  Avoid taking medicines that can affect thinking, such as pain medicines or sleeping medicines. Lifestyle  Make healthy lifestyle choices. ? Be physically active as told by your health care provider. ? Do not use any products that contain nicotine or tobacco, such as cigarettes, e-cigarettes, and chewing tobacco. If you need help quitting, ask your health care provider. ? Do not drink alcohol. ? Practice stress-management techniques when you get stressed. ? Spend time with other people.  Make sure to get quality sleep. These tips can help you get a good night's rest: ? Avoid napping during the day. ? Keep your sleeping area dark and  cool. ? Avoid exercising during the few hours before you go to bed. ? Avoid caffeine products in the evening. Eating and drinking  Drink enough fluid to keep your urine pale yellow.  Eat a healthy diet. General instructions   Work with your health care provider to determine what you need help with and what your safety needs are.  Talk with your health care provider about whether it is safe for you to drive.  If you were given a bracelet that identifies you as a person with memory loss or tracks your location, make sure to wear it at all times.  Work with your family to make important decisions, such as advance directives, medical power of attorney, or a living will.  Keep all follow-up visits as told by your health care provider. This is important. Where to find more information  Alzheimer's Association: LimitLaws.hu  [employer] on Aging: CashCowGambling.be  World Health Organization: https://castaneda-walker.com/ Contact a health care provider if:  You have any  new or worsening symptoms.  You have problems with choking or swallowing. Get help right away if:  You feel depressed or sad, or feel that you want to harm yourself.  Your family members become concerned for your safety. If you ever feel like you may hurt yourself or others, or have thoughts about taking your own life, get help right away. You can go to your nearest emergency department or call:  Your local emergency services (911 in the [place]  A suicide crisis helpline, such as the National Suicide Prevention Lifeline at 219-388-98651-(561)276-4654. This is open 24 hours a day. Summary  Dementia is a condition that affects the way the brain functions. Dementia often affects memory and thinking.  Usually, dementia gets worse with time and cannot be reversed (progressive dementia).  Treatment for this condition depends on the cause of the dementia.  Work with your health care provider to determine what you need help with and  what your safety needs are.  Your health care provider can direct you to support groups, organizations, and other health care providers who can help with decisions about your care. This information is not intended to replace advice given to you by your health care provider. Make sure you discuss any questions you have with your health care provider. Document Released: 02/08/2001 Document Revised: 10/30/2018 Document Reviewed: 10/30/2018 Elsevier Patient Education  2020 Elsevier Inc.    Memory Compensation Strategies  1. Use "WARM" strategy.  W= write it down  A= associate it  R= repeat it  M= make a mental note  2.   You can keep a Glass blower/designerMemory Notebook.  Use a 3-ring notebook with sections for the following: calendar, important names and phone numbers,  medications, doctors' names/phone numbers, lists/reminders, and a section to journal what you did  each day.   3.    Use a calendar to write appointments down.  4.    Write yourself a schedule for the day.  This can be placed on the calendar or in a separate section of the Memory Notebook.  Keeping a  regular schedule can help memory.  5.    Use medication organizer with sections for each day or morning/evening pills.  You may need help loading it  6.    Keep a basket, or pegboard by the door.  Place items that you need to take out with you in the basket or on the pegboard.  You may also want to  include a message board for reminders.  7.    Use sticky notes.  Place sticky notes with reminders in a place where the task is performed.  For example: " turn off the  stove" placed by the stove, "lock the door" placed on the door at eye level, " take your medications" on  the bathroom mirror or by the place where you normally take your medications.  8.    Use alarms/timers.  Use while cooking to remind yourself to check on food or as a reminder to take your medicine, or as a  reminder to make a call, or as a reminder to perform another task,  etc.

## 2019-07-01 NOTE — Progress Notes (Addendum)
PATIENT: Adam Rowland DOB: 10/30/1945  REASON FOR VISIT: follow up HISTORY FROM: patient  Chief Complaint  Patient presents with   Follow-up    Room 2, with wife. Doing Rowland. No concerns. Possibly wants to increase medications dosage.     HISTORY OF PRESENT ILLNESS: Today 07/01/19 Adam Rowland is a 73 y.o. male here today for follow up for mild dementia. He continues Aricept 34m nightly and Namenda 530mtwice daily without adverse effects noted.  Adam Rowland.  He is able to perform all ADLs independently.  He continues to drive but only short, local distances.  He is exercising daily.  He rides a stationary bike for about 30 minutes.  He continues to participate in his [employer] [place] He continues close follow-up with primary care for chronic disease management.  No concerns with getting lost, accidents or falls.  HISTORY: (copied from Adam Rowland on 03/07/2019)  Adam Rowland with an underlying medical history of coronary artery disease with history of non-STEMI, history of ischemic cardiomyopathy, hyperlipidemia, hypertension, kidney stones, nonsustained V. tach, and overweight state, who presents for follow-up consultation of his dementia.  The patient is accompanied by his wife again today.  We had a virtual phone call visit on 12/06/2018, at which time he felt fairly stable.  He was able to tolerate donepezil 10 mg daily.  He started this in June 2019.  We talked about potentially utilizing a second memory medication.   Today, 03/07/19: Please see below for documentation on the virtual visit.   He reports feeling fairly stable, he continues to tolerate the donepezil.  He has had no falls, he tries to stay active but his wife has noted that he is not as outgoing and active as he used to be.  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  He seems  to be less active in conversation.  She has noted that he has more word finding difficulties or delay in responding.  He seems to be just a little bit more sedentary in her perception.  He still drives, mostly locally.  He still sees his mother who lives about 30 minutes drive away and he manages the drive without problems, [place].  He uses the stationary bike every day for about 30 minutes.  She estimates that he drinks about 4 small bottles of water, 8 ounces each.  The patient's allergies, current medications, family history, past medical history, past social history, past surgical history and problem list were reviewed and updated as appropriate.  Previously:  I saw him on 02/13/2018, at which time his MMSE was 21. We talked about his neuropsychological test results from May 2019. I suggested we start him on Aricept 5 mg strength with titration to 10 mg.  He saw Adam Rowland in the interim on 06/06/2018, at which time his MMSE was 26. He was advised to continue with Aricept.  I first met him on 08/14/2017 at the request of his primary care nurse Rowland, at which time he reported short-term memory loss for the past 3-4 years, maybe 5 years. I suggested further workup in the form of neuropsychological evaluation and blood work as Rowland as brain scan. His blood work was unremarkable with the exception of A1c in the prediabetes range. We checked thyroid  function with TSH and B12 level as Rowland. RPR was negative. He had a brain MRI with contrast on 08/18/2017 and I reviewed the results: IMPRESSION: Slightly abnormal MRI scan of the brain showing mild degree of generalized cerebral atrophy. 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  He was called with the results.   He had interim neuropsychological evaluation on 01/01/2018 as Rowland as a feedback  appointment on 01/08/18 with Adam. Bonita Rowland and I reviewed the results: <<Clinical Impressions:Mild dementia, unspecified, without behavioral disturbance (rule out mixed vascular dementia and Alzheimer's disease).  Results of cognitive testing were abnormal and demonstrated significant impairment in several domains of cognitive function. Additionally, there is evidence that his cognitive deficits are interfering with his ability to perform complex tasks such as managing appointments and finances. As such, diagnostic criteria for a dementia syndrome are met.  The patient's cognitive profile demonstrates both cortical and subcortical features. Consistent with cortical dysfunction, he demonstrated memory consolidation dysfunction and mildly impaired confrontation naming. Consistent with subcortical dysfunction, he demonstrated slowed processing speed, executive dysfunction and impaired verbal fluency. This may represent a mixed dementia. It is certainly possible that Alzheimer's disease is present, but I also wonder about vascular contribution based on cardiac history and cognitive profile. Surprisingly, brain MRI report did not mention any small vessel disease or remote infarcts.  Overall, based on current level of functioning and test scores, I would characterize his dementia as mild stage. There is no evidence of behavioral disturbance. There also is no evidence of depression, anxiety or other primary psychiatric disorder.   REVIEW OF SYSTEMS: Out of a complete 14 system review of symptoms, the patient complains only of the following symptoms, memory loss and all other reviewed systems are negative.   ALLERGIES: No Known Allergies  HOME MEDICATIONS: Outpatient Medications Prior to Visit  Medication Sig Dispense Refill   aspirin EC 81 MG EC tablet Take 1 tablet (81 mg total) by mouth daily.     atorvastatin (LIPITOR) 40 MG tablet TAKE 1 TABLET ONCE DAILY AT BEDTIME 90 tablet 1   lisinopril  (ZESTRIL) 20 MG tablet Take 1 tablet (20 mg total) by mouth daily. 90 tablet 1   metoprolol tartrate (LOPRESSOR) 25 MG tablet Take 1 tablet (25 mg total) by mouth 2 (two) times daily. 180 tablet 1   nitroGLYCERIN (NITROSTAT) 0.4 MG SL tablet Place 1 tablet (0.4 mg total) under the tongue every 5 (five) minutes x 3 doses as needed for chest pain. 25 tablet 3   prasugrel (EFFIENT) 10 MG TABS tablet Take 1 tablet (10 mg total) by mouth daily. 90 tablet 1   donepezil (ARICEPT) 10 MG tablet Take 1 tablet (10 mg total) by mouth at bedtime. 90 tablet 3   memantine (NAMENDA) 10 MG tablet Take 0.5 tablets (5 mg total) by mouth 2 (two) times daily. 30 tablet 5   No facility-administered medications prior to visit.     PAST MEDICAL HISTORY: Past Medical History:  Diagnosis Date   CAD (coronary artery disease)    a. 05/2014 NSTEMI/PCI: LM nl, LAD 61m(3.0x23 Xience Alpine DES), D1 90, LCX 868mOM1 80, RCA 30p/d, RPDA 40-50p, EF 40-45% ->Plan for staged PCI of LCX/OM1.   Cardiomyopathy, ischemic    a. 05/2014 Echo: Ef 45-50%, mid-apicalanteroseptal AK, Gr 2 DD.   Cataract    Fracture, ankle 06/12/2014   Hyperlipidemia    Hypertension    Myocardial infarction (  [place]) 2015   non STEMI   Nephrolithiasis 01/16/2013   NSVT (nonsustained ventricular tachycardia) ([place])    a. 05/2014 in setting of NSTEMI -  asymptomatic.    PAST SURGICAL HISTORY: Past Surgical History:  Procedure Laterality Date   ANKLE FRACTURE SURGERY     CARDIAC CATHETERIZATION     CARDIAC CATHETERIZATION Right 06/20/2014   Procedure: CORONARY STENT INTERVENTION;  Surgeon: Peter M Martinique, MD;  Location: [facility] C[facility] LAB;  Service: Cardiovascular;  Laterality: Right;   CATARACT EXTRACTION Bilateral    CORONARY STENT PLACEMENT     LEFT HEART CATHETERIZATION WITH CORONARY ANGIOGRAM N/A 06/20/2014   Procedure: LEFT HEART CATHETERIZATION WITH CORONARY ANGIOGRAM;  Surgeon: Peter M Martinique, MD;  [facility] CATH LAB;   Service: Cardiovascular;  Laterality: N/A;   NASAL SEPTUM SURGERY     VASECTOMY      FAMILY HISTORY: Family History  Problem Relation Age of Onset   Hypertension Mother    Cancer Father        bladder ca   Hyperlipidemia Father    Alcohol abuse Father    Hyperlipidemia Sister    Cancer Brother    Nephrolithiasis Brother    Colon cancer Neg Hx    Stomach cancer Neg Hx    Esophageal cancer Neg Hx     SOCIAL HISTORY: Social History   Socioeconomic History   Marital status: Married    Spouse name: [name]   Number of children: 1   Years of education: 16   Highest education level: Bachelor's degree (e.g., BA, AB, BS)  Occupational History   Occupation: Retired  Scientist, product/process development strain: Not hard at [employer] insecurity    Worry: Never true    Inability: Never true   Transportation needs    Medical: No    Non-medical: No  Tobacco Use   Smoking status: Former Smoker    Packs/day: 1.00    Years: 23.00    Pack years: 23.00    Types: Cigarettes    Start date: 10/26/1964    Quit date: 08/30/1987    Years since quitting: 31.8   Smokeless tobacco: Former Systems developer    Types: [place] date: 02/27/1988   Tobacco comment: quit in 1989, smoked since he was a teenager/chewed tobacco for about 6 months after he quit smoking cigarettes  Substance and Sexual Activity   Alcohol use: Yes    Alcohol/week: 7.0 standard drinks    Types: 7 Glasses of wine per week    Comment: 1 glass of wine nightly    Drug use: No   Sexual activity: Not Currently  Lifestyle   Physical activity    Days per week: 7 days    Minutes per session: 30 min   Stress: Not at all  Relationships   Social connections    Talks on phone: More than three times a week    Gets together: More than three times a week    Attends religious service: More than 4 times per year    Active member of club or organization: Yes    Attends meetings of clubs or organizations: More  than 4 times per year    Relationship status: Married   Intimate partner violence    Fear of current or ex partner: No    Emotionally abused: No    Physically abused: No    Forced sexual activity: No  Other Topics Concern   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 He has one son and one granddaughter.       PHYSICAL EXAM  Vitals:   07/01/19 0839  BP: 128/74  Pulse: 87  Temp: (!) 97.1 F (36.2 C)  Weight: 173 lb 6.4 oz (78.7 kg)  Height: 5' 9"  (1.753 m)   Body mass index is 25.61 kg/m.  Generalized: Rowland developed, in no acute distress  Cardiology: normal rate and rhythm, no murmur noted Neurological examination  Mentation: Alert oriented to time, place, history taking. Follows all commands speech and language fluent Cranial nerve II-XII: Pupils were equal round reactive to light. Extraocular movements were full, visual field were full on confrontational test. Facial sensation and strength were normal. Uvula tongue midline. Head turning and shoulder shrug  were normal and symmetric. Motor: The motor testing reveals 5 over 5 strength of all 4 extremities. Good symmetric motor tone is noted throughout.  Sensory: Sensory testing is intact to soft touch on all 4 extremities. No evidence of extinction is noted.  Coordination: Cerebellar testing reveals good finger-nose-finger and heel-to-shin bilaterally.  Gait and station: Gait is normal.    DIAGNOSTIC DATA (LABS, IMAGING, TESTING) - I reviewed patient records, labs, notes, testing and imaging myself where available.  MMSE - Mini Mental State Exam 07/01/2019 03/07/2019 06/06/2018  Not completed: - - (No Data)  Orientation to time 4 4 3   Orientation to time comments - - -  Orientation to Place 3 4 4   Registration 2 3 3   Attention/ Calculation 0 3 5  Attention/Calculation-comments - - -  Recall 2 1 3   Language- name 2 objects 2 2 2   Language- repeat 1 1 1   Language- follow 3 step  command 3 3 2   Language- read & follow direction 1 1 1   Language-read & follow direction-comments - - -  Write a sentence 1 1 1   Copy design 1 0 1  Copy design-comments named 4 animals - -  Total score 20 23 26      Lab Results  Component Value Date   WBC 6.4 06/02/2016   HGB 15.5 06/02/2016   HCT 44.1 06/02/2016   MCV 96 06/02/2016   PLT 250 06/02/2016      Component Value Date/Time   NA 143 01/28/2019 1027   K 5.1 01/28/2019 1027   CL 106 01/28/2019 1027   CO2 21 01/28/2019 1027   GLUCOSE 90 01/28/2019 1027   GLUCOSE 132 (H) 06/21/2014 0259   BUN 16 01/28/2019 1027   CREATININE 0.98 01/28/2019 1027   CREATININE 0.86 01/16/2013 0845   CALCIUM 9.6 01/28/2019 1027   PROT 6.6 01/28/2019 1027   ALBUMIN 4.4 01/28/2019 1027   AST 15 01/28/2019 1027   ALT 18 01/28/2019 1027   ALKPHOS 54 01/28/2019 1027   BILITOT 0.8 01/28/2019 1027   GFRNONAA 77 01/28/2019 1027   GFRNONAA >89 01/16/2013 0845   GFRAA 89 01/28/2019 1027   GFRAA >89 01/16/2013 0845   Lab Results  Component Value Date   CHOL 108 01/28/2019   HDL 36 (L) 01/28/2019   LDLCALC 54 01/28/2019   TRIG 91 01/28/2019   CHOLHDL 3.0 01/28/2019   Lab Results  Component Value Date   HGBA1C 5.6 01/28/2019   Lab Results  Component Value Date   VITAMINB12 348 08/14/2017   Lab Results  Component Value Date   TSH 2.320 08/14/2017     ASSESSMENT AND PLAN 73 y.o. year old male  has a  past medical history of CAD (coronary artery disease), Cardiomyopathy, ischemic, Cataract, Fracture, ankle (06/12/2014), Hyperlipidemia, Hypertension, Myocardial infarction ([place]) (2015), Nephrolithiasis (01/16/2013), and NSVT (nonsustained ventricular tachycardia) ([place]). here with     ICD-10-CM   1. Dementia without behavioral disturbance, unspecified dementia type ([place])  F03.90   2. 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 Graham is doing Rowland overall.  He is tolerating Aricept and Namenda Rowland.  We will continue  Aricept 10 mg daily.  I will increase Namenda to 10 mg twice daily.  Side effects reviewed.  He will call with any new or worsening concerns.  We have discussed formal neurocognitive evaluation.  Dementia is most likely in a mixed form of Alzheimer's and possibly vascular dementia.  He was encouraged to continue regular exercise as Rowland as eat a healthy Rowland-balanced diet.  They may consider researching the MIND diet if interested.  I have advised caution with driving.  He should only drive short, local distances.  We will follow-up in 6 months to repeat MMSE.  He and his wife both verbalized understanding and agreement with this plan.   No orders of the defined types were placed in this encounter.    Meds ordered this encounter  Medications   memantine (NAMENDA) 10 MG tablet    Sig: Take 1 tablet (10 mg total) by mouth 2 (two) times daily.    Dispense:  180 tablet    Refill:  3    Order Specific Question:   Supervising Provider    Answer:   Melvenia Beam [[id number]]   donepezil (ARICEPT) 10 MG tablet    Sig: Take 1 tablet (10 mg total) by mouth at bedtime.    Dispense:  90 tablet    Refill:  3    Order Specific Question:   Supervising Provider    Answer:   Melvenia Beam [id number]      I spent 15 minutes with the patient. 50% of this time was spent counseling and educating patient on plan of care and medications.    Debbora Presto, FNP-C 07/01/2019, 9:36 AM [facility] [address], [place], [place] [address] [phone]  I reviewed the above note and documentation by the Nurse Rowland and agree with the history, exam, assessment and plan as outlined above. I was available for consultation. Star Age, MD, PhD [facility])

## 2019-07-04 DIAGNOSIS — D485 Neoplasm of uncertain behavior of skin: Secondary | ICD-10-CM | POA: Diagnosis not present

## 2019-07-04 DIAGNOSIS — C4442 Squamous cell carcinoma of skin of scalp and neck: Secondary | ICD-10-CM | POA: Diagnosis not present

## 2019-07-04 DIAGNOSIS — L578 Other skin changes due to chronic exposure to nonionizing radiation: Secondary | ICD-10-CM | POA: Diagnosis not present

## 2019-07-04 DIAGNOSIS — L570 Actinic keratosis: Secondary | ICD-10-CM | POA: Diagnosis not present

## 2019-07-29 ENCOUNTER — Encounter: Payer: Self-pay | Admitting: Cardiovascular Disease

## 2019-07-29 ENCOUNTER — Other Ambulatory Visit: Payer: Self-pay

## 2019-07-29 ENCOUNTER — Ambulatory Visit: Payer: Medicare Other | Admitting: Cardiovascular Disease

## 2019-07-29 VITALS — BP 122/70 | HR 62 | Ht 69.0 in | Wt 177.0 lb

## 2019-07-29 DIAGNOSIS — I25118 Atherosclerotic heart disease of native coronary artery with other forms of angina pectoris: Secondary | ICD-10-CM | POA: Diagnosis not present

## 2019-07-29 DIAGNOSIS — E785 Hyperlipidemia, unspecified: Secondary | ICD-10-CM | POA: Diagnosis not present

## 2019-07-29 DIAGNOSIS — I255 Ischemic cardiomyopathy: Secondary | ICD-10-CM | POA: Diagnosis not present

## 2019-07-29 DIAGNOSIS — I10 Essential (primary) hypertension: Secondary | ICD-10-CM

## 2019-07-29 NOTE — Addendum Note (Signed)
Addended by: Laurine Blazer on: 07/29/2019 04:11 PM   Modules accepted: Orders

## 2019-07-29 NOTE — Progress Notes (Signed)
SUBJECTIVE: The patient presents for overdue follow-up.  I last evaluated him in October 2018. He has a history of a non-STEMI in 05/2014 and underwent coronary artery stent placement to the LAD. He has significant residual disease of the left circumflex and first obtuse marginal, and it was felt that a staged approach to additional intervention would be appropriate once he fully recovered from his myocardial infarction. He has an ischemic cardiomyopathy, EF 45-50%. He also has hypertension and hyperlipidemia.  ECG performed today which I personally view demonstrates sinus bradycardia with old inferior infarct and nonspecific IVCD.  He is here with his wife.  He has remained active doing yard work.  The patient denies any symptoms of chest pain, palpitations, shortness of breath, lightheadedness, dizziness, leg swelling, orthopnea, PND, and syncope.    Review of Systems: As per "subjective", otherwise negative.  No Known Allergies  Current Outpatient Medications  Medication Sig Dispense Refill  . aspirin EC 81 MG EC tablet Take 1 tablet (81 mg total) by mouth daily.    Marland Kitchen atorvastatin (LIPITOR) 40 MG tablet TAKE 1 TABLET ONCE DAILY AT BEDTIME 90 tablet 1  . donepezil (ARICEPT) 10 MG tablet Take 1 tablet (10 mg total) by mouth at bedtime. 90 tablet 3  . lisinopril (ZESTRIL) 20 MG tablet Take 1 tablet (20 mg total) by mouth daily. 90 tablet 1  . memantine (NAMENDA) 10 MG tablet Take 1 tablet (10 mg total) by mouth 2 (two) times daily. 180 tablet 3  . metoprolol tartrate (LOPRESSOR) 25 MG tablet Take 1 tablet (25 mg total) by mouth 2 (two) times daily. 180 tablet 1  . nitroGLYCERIN (NITROSTAT) 0.4 MG SL tablet Place 1 tablet (0.4 mg total) under the tongue every 5 (five) minutes x 3 doses as needed for chest pain. 25 tablet 3  . prasugrel (EFFIENT) 10 MG TABS tablet Take 1 tablet (10 mg total) by mouth daily. 90 tablet 1   No current facility-administered medications for this visit.      Past Medical History:  Diagnosis Date  . CAD (coronary artery disease)    a. 05/2014 NSTEMI/PCI: LM nl, LAD 24m (3.0x23 Xience Alpine DES), D1 90, LCX 28m, OM1 80, RCA 30p/d, RPDA 40-50p, EF 40-45% ->Plan for staged PCI of LCX/OM1.  . Cardiomyopathy, ischemic    a. 05/2014 Echo: Ef 45-50%, mid-apicalanteroseptal AK, Gr 2 DD.  Marland Kitchen Cataract   . Fracture, ankle 06/12/2014  . Hyperlipidemia   . Hypertension   . Myocardial infarction [facility]) 2015   non STEMI  . Nephrolithiasis 01/16/2013  . NSVT (nonsustained ventricular tachycardia) ([place])    a. 05/2014 in setting of NSTEMI -  asymptomatic.    Past Surgical History:  Procedure Laterality Date  . ANKLE FRACTURE SURGERY    . CARDIAC CATHETERIZATION    . CARDIAC CATHETERIZATION Right 06/20/2014   Procedure: CORONARY STENT INTERVENTION;  Surgeon: Peter M Martinique, MD;  Location: [facility] CATH LAB;  Service: Cardiovascular;  Laterality: Right;  . CATARACT EXTRACTION Bilateral   . CORONARY STENT PLACEMENT    . LEFT HEART CATHETERIZATION WITH CORONARY ANGIOGRAM N/A 06/20/2014   Procedure: LEFT HEART CATHETERIZATION WITH CORONARY ANGIOGRAM;  Surgeon: Peter M Martinique, MD;  Location: [facility] CATH LAB;  Service: Cardiovascular;  Laterality: N/A;  . NASAL SEPTUM SURGERY    . VASECTOMY      Social History   Socioeconomic History  . Marital status: Married    Spouse name: [name]  . Number of children: 1  .  Years of education: 15  . Highest education level: Bachelor's degree (e.g., BA, AB, BS)  Occupational History  . Occupation: Retired  Engineer, production  . Financial resource strain: Not hard at all  . Food insecurity    Worry: Never true    Inability: Never true  . Transportation needs    Medical: No    Non-medical: No  Tobacco Use  . Smoking status: Former Smoker    Packs/day: 1.00    Years: 23.00    Pack years: 23.00    Types: Cigarettes    Start date: 10/26/1964    Quit date: 08/30/1987    Years since quitting: 31.9  . Smokeless tobacco:  Former Neurosurgeon    Types: Chew    Quit date: 02/27/1988  . Tobacco comment: quit in 1989, smoked since he was a teenager/chewed tobacco for about 6 months after he quit smoking cigarettes  Substance and Sexual Activity  . Alcohol use: Yes    Alcohol/week: 7.0 standard drinks    Types: 7 Glasses of wine per week    Comment: 1 glass of wine nightly   . Drug use: No  . Sexual activity: Not Currently  Lifestyle  . Physical activity    Days per week: 7 days    Minutes per session: 30 min  . Stress: Not at all  Relationships  . Social connections    Talks on phone: More than three times a week    Gets together: More than three times a week    Attends religious service: More than 4 times per year    Active member of club or organization: Yes    Attends meetings of clubs or organizations: More than 4 times per year    Relationship status: Married  . Intimate partner violence    Fear of current or ex partner: No    Emotionally abused: No    Physically abused: No    Forced sexual activity: No  Other Topics Concern  . 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 He has one son and one granddaughter.      Vitals:   07/29/19 1539  BP: 122/70  Pulse: 62  SpO2: 98%  Weight: 177 lb (80.3 kg)  Height: 5\' 9"  (1.753 m)    Wt Readings from Last 3 Encounters:  07/29/19 177 lb (80.3 kg)  07/01/19 173 lb 6.4 oz (78.7 kg)  03/07/19 175 lb (79.4 kg)     PHYSICAL EXAM General: NAD HEENT: Normal. Neck: No JVD, no thyromegaly. Lungs: Clear to auscultation bilaterally with normal respiratory effort. CV: Regular rate and rhythm, normal S1/S2, no S3/S4, no murmur. No pretibial or periankle edema.  No carotid bruit.   Abdomen: Soft, nontender, no distention.  Neurologic: Alert and oriented.  Psych: Normal affect. Skin: Normal. Musculoskeletal: No gross deformities.      Labs: Lab Results  Component Value Date/Time   K 5.1 01/28/2019 10:27 AM    BUN 16 01/28/2019 10:27 AM   CREATININE 0.98 01/28/2019 10:27 AM   CREATININE 0.86 01/16/2013 08:45 AM   ALT 18 01/28/2019 10:27 AM   TSH 2.320 08/14/2017 10:56 AM   HGB 15.5 06/02/2016 08:24 AM     Lipids: Lab Results  Component Value Date/Time   LDLCALC 54 01/28/2019 10:27 AM   LDLCALC 83 01/16/2014 10:54 AM   LDLCALC 105 (H) 01/16/2013 08:45 AM   CHOL 108 01/28/2019 10:27 AM   CHOL 171 01/16/2013 08:45 AM  TRIG 91 01/28/2019 10:27 AM   TRIG 134 10/23/2014 09:56 AM   TRIG 176 (H) 01/16/2013 08:45 AM   HDL 36 (L) 01/28/2019 10:27 AM   HDL 42 10/23/2014 09:56 AM   HDL 31 (L) 01/16/2013 08:45 AM       ASSESSMENT AND PLAN: 1. CAD s/p LAD stent for NSTEMI: Symptomatically stable. Continue aspirin, Lipitor, lisinopril, and metoprolol.  I will discontinue prasugrel. He may eventually require a staged intervention of the left circumflex coronary artery and first obtuse marginal branch if optimal medical therapy fails. However, he has been doing very well for some several years now.  No changes to therapy.  2. Essential HTN: Controlled on lisinopril 20 mg daily. No changes.  3. Hyperlipidemia: Lipids reviewed above.  LDL at goal, 54.  Continue Lipitor 40 mg daily.  4. Ischemic cardiomyopathy: Symptomatically stable with no evidence of heart failure. No changes to therapy which includes an ACE inhibitor and beta blocker.  I will obtain a follow-up echocardiogram.    Disposition: Follow up 1 year   Prentice Docker, M.D., F.A.C.C.

## 2019-07-29 NOTE — Patient Instructions (Addendum)
Medication Instructions:   Stop Effient (Prasugrel).   Continue all other medications.    Labwork: none  Testing/Procedures:  Your physician has requested that you have an echocardiogram. Echocardiography is a painless test that uses sound waves to create images of your heart. It provides your doctor with information about the size and shape of your heart and how well your heart's chambers and valves are working. This procedure takes approximately one hour. There are no restrictions for this procedure.  Office will contact with results via phone or letter.    Follow-Up: Your physician wants you to follow up in:  1 year.  You will receive a reminder letter in the mail one-two months in advance.  If you don't receive a letter, please call our office to schedule the follow up appointment   Any Other Special Instructions Will Be Listed Below (If Applicable).  If you need a refill on your cardiac medications before your next appointment, please call your pharmacy.

## 2019-07-30 ENCOUNTER — Encounter: Payer: Self-pay | Admitting: Nurse Practitioner

## 2019-07-30 ENCOUNTER — Ambulatory Visit (INDEPENDENT_AMBULATORY_CARE_PROVIDER_SITE_OTHER): Payer: Medicare Other | Admitting: Nurse Practitioner

## 2019-07-30 VITALS — BP 136/82 | HR 57 | Temp 97.8°F | Resp 20 | Ht 69.0 in | Wt 175.0 lb

## 2019-07-30 DIAGNOSIS — E8881 Metabolic syndrome: Secondary | ICD-10-CM

## 2019-07-30 DIAGNOSIS — Z6825 Body mass index (BMI) 25.0-25.9, adult: Secondary | ICD-10-CM

## 2019-07-30 DIAGNOSIS — I10 Essential (primary) hypertension: Secondary | ICD-10-CM

## 2019-07-30 DIAGNOSIS — I25118 Atherosclerotic heart disease of native coronary artery with other forms of angina pectoris: Secondary | ICD-10-CM | POA: Diagnosis not present

## 2019-07-30 DIAGNOSIS — E782 Mixed hyperlipidemia: Secondary | ICD-10-CM | POA: Diagnosis not present

## 2019-07-30 DIAGNOSIS — F0390 Unspecified dementia without behavioral disturbance: Secondary | ICD-10-CM

## 2019-07-30 DIAGNOSIS — I2510 Atherosclerotic heart disease of native coronary artery without angina pectoris: Secondary | ICD-10-CM

## 2019-07-30 DIAGNOSIS — Z125 Encounter for screening for malignant neoplasm of prostate: Secondary | ICD-10-CM

## 2019-07-30 MED ORDER — METOPROLOL TARTRATE 25 MG PO TABS: 25.0000 mg | ORAL_TABLET | Freq: Two times a day (BID) | ORAL | 1 refills | Status: DC

## 2019-07-30 MED ORDER — LISINOPRIL 20 MG PO TABS: 20.0000 mg | ORAL_TABLET | Freq: Every day | ORAL | 1 refills | Status: DC

## 2019-07-30 MED ORDER — ATORVASTATIN CALCIUM 40 MG PO TABS: ORAL_TABLET | ORAL | 1 refills | Status: DC

## 2019-07-30 NOTE — Patient Instructions (Signed)

## 2019-07-30 NOTE — Progress Notes (Signed)
Subjective:    Patient ID: Adam Rowland, male    DOB: 26-Dec-1945, 73 y.o.   MRN: [id number]   Chief Complaint: Medical Management of Chronic Issues    HPI:  1. Essential hypertension No c/o chest pain, sob or headache. Does not check blood pressure at home. BP Readings from Last 3 Encounters:  07/29/19 122/70  07/01/19 128/74  03/07/19 (!) 165/69     2. Coronary artery disease of native artery of native heart with stable angina pectoris ([place]) Had NSTEMI in 2015. He saw cardiology yesterday, which was his first follow up since 2018. They are scheduling him for repeat echocardiogram. They stopped his effient because he was doing so well. He is to follow up in 1 year if echocardiogram is ok.   3. Mixed hyperlipidemia Does not watch diet. Does very little exercise. Lab Results  Component Value Date   CHOL 108 01/28/2019   HDL 36 (L) 01/28/2019   LDLCALC 54 01/28/2019   TRIG 91 01/28/2019   CHOLHDL 3.0 01/28/2019     4. Metabolic syndrome He tries to watch sweets. He does not check blood sugars at home. Lab Results  Component Value Date   HGBA1C 5.6 01/28/2019     5. Dementia without behavioral disturbance, unspecified dementia type [facility]) He says he is okay. Still driving. Says he is maintaining right now. He is still taking aricept and namenda.   6. BMI 27.0-27.9,adult Only slight changes in weight over the last year. Wt Readings from Last 3 Encounters:  07/30/19 175 lb (79.4 kg)  07/29/19 177 lb (80.3 kg)  07/01/19 173 lb 6.4 oz (78.7 kg)   BMI Readings from Last 3 Encounters:  07/30/19 25.84 kg/m  07/29/19 26.14 kg/m  07/01/19 25.61 kg/m       Outpatient Encounter Medications as of 07/30/2019  Medication Sig  . aspirin EC 81 MG EC tablet Take 1 tablet (81 mg total) by mouth daily.  Marland Kitchen atorvastatin (LIPITOR) 40 MG tablet TAKE 1 TABLET ONCE DAILY AT BEDTIME  . donepezil (ARICEPT) 10 MG tablet Take 1 tablet (10 mg total) by mouth at bedtime.  Marland Kitchen  lisinopril (ZESTRIL) 20 MG tablet Take 1 tablet (20 mg total) by mouth daily.  . memantine (NAMENDA) 10 MG tablet Take 1 tablet (10 mg total) by mouth 2 (two) times daily.  . metoprolol tartrate (LOPRESSOR) 25 MG tablet Take 1 tablet (25 mg total) by mouth 2 (two) times daily.  . nitroGLYCERIN (NITROSTAT) 0.4 MG SL tablet Place 1 tablet (0.4 mg total) under the tongue every 5 (five) minutes x 3 doses as needed for chest pain.     Past Surgical History:  Procedure Laterality Date  . ANKLE FRACTURE SURGERY    . CARDIAC CATHETERIZATION    . CARDIAC CATHETERIZATION Right 06/20/2014   Procedure: CORONARY STENT INTERVENTION;  Surgeon: Peter M Martinique, MD;  Location[facility];  Service: Cardiovascular;  Laterality: Right;  . CATARACT EXTRACTION Bilateral   . CORONARY STENT PLACEMENT    . LEFT HEART CATHETERIZATION WITH CORONARY ANGIOGRAM N/A 06/20/2014   Procedure: LEFT HEART CATHETERIZATION WITH CORONARY ANGIOGRAM;  Surgeon: Peter M Martinique, MD;  Location: [facility] CATH LAB;  Service: Cardiovascular;  Laterality: N/A;  . NASAL SEPTUM SURGERY    . VASECTOMY      Family History  Problem Relation Age of Onset  . Hypertension Mother   . Cancer Father        bladder ca  . Hyperlipidemia Father   .  Alcohol abuse Father   . Hyperlipidemia Sister   . Cancer Brother   . Nephrolithiasis Brother   . Colon cancer Neg Hx   . Stomach cancer Neg Hx   . Esophageal cancer Neg Hx     New complaints: None today  Social history: He is retired and is doing okay with it  Controlled substance contract: n/a    Review of Systems  Constitutional: Negative for activity change and appetite change.  HENT: Negative.   Eyes: Negative for pain.  Respiratory: Negative for shortness of breath.   Cardiovascular: Negative for chest pain, palpitations and leg swelling.  Gastrointestinal: Negative for abdominal pain.  Endocrine: Negative for polydipsia.  Genitourinary: Negative.   Skin: Negative for rash.   Neurological: Negative for dizziness, weakness and headaches.  Hematological: Does not bruise/bleed easily.  Psychiatric/Behavioral: Negative.   All other systems reviewed and are negative.      Objective:   Physical Exam Vitals signs and nursing note reviewed.  Constitutional:      Appearance: Normal appearance. He is well-developed.  HENT:     Head: Normocephalic.     Nose: Nose normal.  Eyes:     Pupils: Pupils are equal, round, and reactive to light.  Neck:     Musculoskeletal: Normal range of motion and neck supple.     Thyroid: No thyroid mass or thyromegaly.     Vascular: No carotid bruit or JVD.     Trachea: Phonation normal.  Cardiovascular:     Rate and Rhythm: Normal rate and regular rhythm.  Pulmonary:     Effort: Pulmonary effort is normal. No respiratory distress.     Breath sounds: Normal breath sounds.  Abdominal:     General: Bowel sounds are normal.     Palpations: Abdomen is soft.     Tenderness: There is no abdominal tenderness.  Musculoskeletal: Normal range of motion.  Lymphadenopathy:     Cervical: No cervical adenopathy.  Skin:    General: Skin is warm and dry.  Neurological:     Mental Status: He is alert and oriented to person, place, and time.  Psychiatric:        Behavior: Behavior normal.        Thought Content: Thought content normal.        Judgment: Judgment normal.    BP 136/82 (BP Location: Left Arm, Cuff Size: Normal)   Pulse (!) 57   Temp 97.8 F (36.6 C) (Temporal)   Resp 20   Ht _0  (1.753 m)   Wt 175 lb (79.4 kg)   SpO2 99%   BMI 25.84 kg/m         Assessment & Plan:  Adam Rowland comes in today with chief complaint of Medical Management of Chronic Issues   Diagnosis and orders addressed:  1. Essential hypertension Low sodium diet - lisinopril (ZESTRIL) 20 MG tablet; Take 1 tablet (20 mg total) by mouth daily.  Dispense: 90 tablet; Refill: 1 - CMP14+EGFR  2. Coronary artery disease of native artery of  native heart with stable angina pectoris [facility]) Keep follow up with cardiology  3. Mixed hyperlipidemia Low ft diet - atorvastatin (LIPITOR) 40 MG tablet; TAKE 1 TABLET ONCE DAILY AT BEDTIME  Dispense: 90 tablet; Refill: 1 - Lipid panel  4. Metabolic syndrome Watch sugars in diet  5. Dementia without behavioral disturbance, unspecified dementia type ([place]) Keep follow up with neurology  6. BMI 25.0-25.9,adult Discussed diet and exercise for person with BMI >25  Will recheck weight in 3-6 months  7. Coronary artery disease involving native coronary artery of native heart without angina pectoris - metoprolol tartrate (LOPRESSOR) 25 MG tablet; Take 1 tablet (25 mg total) by mouth 2 (two) times daily.  Dispense: 180 tablet; Refill: 1  8. Prostate cancer screening - PSA, total and free   Labs pending Health Maintenance reviewed Diet and exercise encouraged  Follow up plan: 6 months   [place], FNP

## 2019-08-15 ENCOUNTER — Other Ambulatory Visit: Payer: Medicare Other

## 2019-08-15 ENCOUNTER — Other Ambulatory Visit: Payer: Self-pay | Admitting: Nurse Practitioner

## 2019-08-26 DIAGNOSIS — D044 Carcinoma in situ of skin of scalp and neck: Secondary | ICD-10-CM | POA: Diagnosis not present

## 2019-09-04 ENCOUNTER — Telehealth: Payer: Self-pay | Admitting: Cardiology

## 2019-09-04 ENCOUNTER — Telehealth: Payer: Self-pay | Admitting: Cardiovascular Disease

## 2019-09-04 NOTE — Telephone Encounter (Signed)

## 2019-09-05 ENCOUNTER — Ambulatory Visit (INDEPENDENT_AMBULATORY_CARE_PROVIDER_SITE_OTHER): Payer: Medicare Other

## 2019-09-05 ENCOUNTER — Other Ambulatory Visit: Payer: Self-pay

## 2019-09-05 DIAGNOSIS — I255 Ischemic cardiomyopathy: Secondary | ICD-10-CM | POA: Diagnosis not present

## 2019-09-09 NOTE — Telephone Encounter (Signed)
ERROR

## 2019-09-11 ENCOUNTER — Telehealth: Payer: Self-pay | Admitting: *Deleted

## 2019-09-11 NOTE — Telephone Encounter (Signed)
-----   Message from Laqueta Linden, MD sent at 09/05/2019 10:10 AM EST ----- Normal pumping function.

## 2019-09-11 NOTE — Telephone Encounter (Signed)
Lesle Chris, [place]  1/50/5697 9:48 PM EST    Wife [name]) notified. Copy to pmd.

## 2019-09-11 NOTE — Telephone Encounter (Signed)
Lesle Chris, [place]  04/26/9370 69:67 PM EST    Left message to return call.

## 2019-09-18 ENCOUNTER — Ambulatory Visit: Payer: Medicare Other | Attending: Internal Medicine

## 2019-09-18 DIAGNOSIS — Z23 Encounter for immunization: Secondary | ICD-10-CM | POA: Insufficient documentation

## 2019-09-18 NOTE — Progress Notes (Signed)
   Covid-19 Vaccination Clinic  Name:  Adam Rowland    MRN: [id number] DOB: July 08, 1946  09/18/2019  Adam Rowland was observed post Covid-19 immunization for 15 minutes without incidence. He was provided with Vaccine Information Sheet and instruction to access the V-Safe system.   Adam Rowland was instructed to call 911 with any severe reactions post vaccine: Marland Kitchen Difficulty breathing  . Swelling of your face and throat  . A fast heartbeat  . A bad rash all over your body  . Dizziness and weakness    Immunizations Administered    Name Date Dose VIS Date Route   Pfizer COVID-19 Vaccine 09/18/2019  8:46 AM 0.3 mL 08/09/2019 Intramuscular   Manufacturer: [employer], [employer]   Lot: [id number]   NDC: [address]-0

## 2019-09-23 DIAGNOSIS — S0100XD Unspecified open wound of scalp, subsequent encounter: Secondary | ICD-10-CM | POA: Diagnosis not present

## 2019-10-09 ENCOUNTER — Ambulatory Visit: Payer: Medicare Other | Attending: Internal Medicine

## 2019-10-09 DIAGNOSIS — Z23 Encounter for immunization: Secondary | ICD-10-CM | POA: Insufficient documentation

## 2019-10-09 NOTE — Progress Notes (Signed)
   Covid-19 Vaccination Clinic  Name:  JEREL SARDINA    MRN: [id number] DOB: [birth year]  10/09/2019  Mr. Maddocks was observed post Covid-19 immunization for 15 minutes without incidence. He was provided with Vaccine Information Sheet and instruction to access the V-Safe system.   Mr. Gassmann was instructed to call 911 with any severe reactions post vaccine: Marland Kitchen Difficulty breathing  . Swelling of your face and throat  . A fast heartbeat  . A bad rash all over your body  . Dizziness and weakness    Immunizations Administered    Name Date Dose VIS Date Route   Pfizer COVID-19 Vaccine 10/09/2019 10:20 AM 0.3 mL 08/09/2019 Intramuscular   Manufacturer: [employer], [employer]   Lot: [id number]   NDC: [address]-5

## 2019-12-26 NOTE — Progress Notes (Addendum)
PATIENT: Adam Rowland DOB: 02/05/1946  REASON FOR VISIT: follow up HISTORY FROM: patient  Chief Complaint  Patient presents with  . Follow-up    back corner room here for a f/u     HISTORY OF PRESENT ILLNESS: Today 12/30/19 Adam Rowland is a 74 y.o. male here today for follow up for memory. He continues Aricept 92m daily and Namenda 130mtwice daily. He is doing well and tolerating medications without obvious adverse effects. 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 He drives locally without difficulty. He performs ADL's independently. No falls. He is able to participate in financial conversations with fiFacilities managerHe does have more difficulty with word finding. This is worse in situations where he is anxious. Overall, he feels that he is doing well.   HISTORY: (copied from my note on 07/01/2019)  Adam STREETYs a [age].o. male here today for follow up for mild dementia. He continues Aricept 1030mightly and Namenda 5mg47mice daily without adverse effects noted.  [name] that he is doing well.  He is able to perform all ADLs independently.  He continues to drive but only short, local distances.  He is exercising daily.  He rides a stationary bike for about 30 minutes.  He continues to participate in his son'[employer] in [place] continues close follow-up with primary care for chronic disease management.  No concerns with getting lost, accidents or falls.  HISTORY: (copied from Dr Adam Rowland on 03/07/2019)  Adam Rowland 73 y54r old right-handed gentleman with an underlying medical history of coronary artery disease with history of non-STEMI, history of ischemic cardiomyopathy, hyperlipidemia, hypertension, kidney stones, nonsustained V. tach, and overweight state, who presents for follow-up consultation of his dementia. The patient is accompanied by his wife again today. We had a virtual phone call visit on 12/06/2018, at which  time he felt fairly stable. He was able to tolerate donepezil 10 mg daily. He started this in June 2019. We talked about potentially utilizing a second memory medication.   Today, 03/07/19: Please see below for documentation on the virtual visit.   He reports feeling fairly stable, he continues to tolerate the donepezil. He has had no falls, he tries to stay active but his wife has noted that he is not as outgoing and active as he used to be. 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 He seems to be less active in conversation. She has noted that he has more word finding difficulties or delay in responding. He seems to be just a little bit more sedentary in her perception. He still drives, mostly locally. He still sees his mother who lives about 30 minutes drive away and he manages the drive without problems, [place]. He uses the stationary bike every day for about 30 minutes. She estimates that he drinks about 4 small bottles of water, 8 ounces each.  The patient's allergies, current medications, family history, past medical history, past social history, past surgical history and problem list were reviewed and updated as appropriate.  Previously:  I saw him on 02/13/2018, at which time his MMSE was 21. We talked about his neuropsychological test results from May 2019. I suggested we start him on Aricept 5 mg strength with titration to 10 mg.  He saw [facility]ec[facility] Rubinr[facility] practitioner in the interim on  06/06/2018, at which time his MMSE was 26. He was advised to continue with Aricept.  I first met him on 08/14/2017 at the request of his primary care nurse practitioner, at which time he reported short-term memory loss for the past 3-4 years, maybe 5 years. I suggested further workup in the form of neuropsychological evaluation and blood work as well as brain scan. His blood work was unremarkable with the exception of  A1c in the prediabetes range. We checked thyroid function with TSH and B12 level as well. RPR was negative. He had a brain MRI with contrast on 08/18/2017 and I reviewed the results: IMPRESSION: Slightly abnormal MRI scan of the brain showing mild degree of generalized cerebral atrophy. 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  He was called with the results.   He had interim neuropsychological evaluation on 01/01/2018 as well as a feedback appointment on 01/08/18 with Dr. Bonita Quin and I reviewed the results: <<Clinical Impressions:Mild dementia, unspecified, without behavioral disturbance (rule out mixed vascular dementia and Alzheimer's disease).  Results of cognitive testing were abnormal and demonstrated significant impairment in several domains of cognitive function. Additionally, there is evidence that his cognitive deficits are interfering with his ability to perform complex tasks such as managing appointments and finances. As such, diagnostic criteria for a dementia syndrome are met.  The patient's cognitive profile demonstrates both cortical and subcortical features. Consistent with cortical dysfunction, he demonstrated memory consolidation dysfunction and mildly impaired confrontation naming. Consistent with subcortical dysfunction, he demonstrated slowed processing speed, executive dysfunction and impaired verbal fluency. This may represent a mixed dementia. It is certainly possible that Alzheimer's disease is present, but I also wonder about vascular contribution based on cardiac history and cognitive profile. Surprisingly, brain MRI report did not mention any small vessel disease or remote infarcts.  Overall, based on current level of functioning and test scores, I would characterize his dementia as mild stage. There is no evidence of behavioral disturbance. There also is no evidence of  depression, anxiety or other primary psychiatric disorder   REVIEW OF SYSTEMS: Out of a complete 14 system review of symptoms, the patient complains only of the following symptoms, none and all other reviewed systems are negative.  ALLERGIES: No Known Allergies  HOME MEDICATIONS: Outpatient Medications Prior to Visit  Medication Sig Dispense Refill  . aspirin EC 81 MG EC tablet Take 1 tablet (81 mg total) by mouth daily.    Marland Kitchen atorvastatin (LIPITOR) 40 MG tablet TAKE 1 TABLET ONCE DAILY AT BEDTIME 90 tablet 1  . donepezil (ARICEPT) 10 MG tablet Take 1 tablet (10 mg total) by mouth at bedtime. 90 tablet 3  . lisinopril (ZESTRIL) 20 MG tablet Take 1 tablet (20 mg total) by mouth daily. 90 tablet 1  . memantine (NAMENDA) 10 MG tablet Take 1 tablet (10 mg total) by mouth 2 (two) times daily. 180 tablet 3  . metoprolol tartrate (LOPRESSOR) 25 MG tablet Take 1 tablet (25 mg total) by mouth 2 (two) times daily. 180 tablet 1  . nitroGLYCERIN (NITROSTAT) 0.4 MG SL tablet Place 1 tablet (0.4 mg total) under the tongue every 5 (five) minutes x 3 doses as needed for chest pain. 25 tablet 3   No facility-administered medications prior to visit.    PAST MEDICAL HISTORY: Past Medical History:  Diagnosis Date  . CAD (coronary artery disease)    a. 05/2014 NSTEMI/PCI: LM nl, LAD 21m(3.0x23  Xience Alpine DES), D1 90, LCX 80m OM1 80, RCA 30p/d, RPDA 40-50p, EF 40-45% ->Plan for staged PCI of LCX/OM1.  . Cardiomyopathy, ischemic    a. 05/2014 Echo: Ef 45-50%, mid-apicalanteroseptal AK, Gr 2 DD.  .Marland KitchenCataract   . Fracture, ankle 06/12/2014  . Hyperlipidemia   . Hypertension   . Myocardial infarction ([facility] 2015   non STEMI  . Nephrolithiasis 01/16/2013  . [place]    a. 05/2014 in setting of NSTEMI -  asymptomatic.    PAST SURGICAL HISTORY: Past Surgical History:  Procedure Laterality Date  . ANKLE FRACTURE SURGERY    . CARDIAC CATHETERIZATION    .  CARDIAC CATHETERIZATION Right 06/20/2014   Procedure: CORONARY STENT INTERVENTION;  Surgeon: Peter M JMartinique MD;  [facility];  Service: Cardiovascular;  Laterality: Right;  . CATARACT EXTRACTION Bilateral   . CORONARY STENT PLACEMENT    . LEFT HEART CATHETERIZATION WITH CORONARY ANGIOGRAM N/A 06/20/2014   Procedure: LEFT HEART CATHETERIZATION WITH CORONARY ANGIOGRAM;  Surgeon: Peter M JMartinique MD;  [facility];  Service: Cardiovascular;  Laterality: N/A;  . NASAL SEPTUM SURGERY    . VASECTOMY      FAMILY HISTORY: Family History  Problem Relation Age of Onset  . Hypertension Mother   . Cancer Father        bladder ca  . Hyperlipidemia Father   . Alcohol abuse Father   . Hyperlipidemia Sister   . Cancer Brother   . Nephrolithiasis Brother   . Colon cancer Neg Hx   . Stomach cancer Neg Hx   . Esophageal cancer Neg Hx     SOCIAL HISTORY: Social History   Socioeconomic History  . Marital status: Married    Spouse name: JBlanch Media . Number of children: 1  . Years of education: 168 . Highest education level: Bachelor's degree (e.g., BA, AB, BS)  Occupational History  . Occupation: Retired  Tobacco Use  . Smoking status: Former Smoker    Packs/day: 1.00    Years: 23.00    Pack years: 23.00    Types: Cigarettes    Start date: 10/26/1964    Quit date: 08/30/1987    Years since quitting: 32.3  . Smokeless tobacco: Former USystems developer   Types: Chew    Quit date: 02/27/1988  . Tobacco comment: quit in 1989, smoked since he was a teenager/chewed tobacco for about 6 months after he quit smoking cigarettes  Substance and Sexual Activity  . Alcohol use: Yes    Alcohol/week: 7.0 standard drinks    Types: 7 Glasses of wine per week    Comment: 1 glass of wine nightly   . Drug use: No  . Sexual activity: Not Currently  Other Topics Concern  . 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 He has one son and one granddaughter.     Social Determinants of Health   Financial Resource Strain:   . Difficulty of Paying Living Expenses:   Food Insecurity:   . Worried About RCharity fundraiserin the Last Year:   . RArboriculturistin the Last Year:   Transportation Needs:   . LFilm/video editor(Medical):   .Marland KitchenLack of Transportation (Non-Medical):   Physical Activity:   . Days of Exercise per Week:   . Minutes of Exercise per Session:   Stress:   . Feeling of Stress :  Social Connections:   . Frequency of Communication with Friends and Family:   . Frequency of Social Gatherings with Friends and Family:   . Attends Religious Services:   . Active Member of Clubs or Organizations:   . Attends Archivist Meetings:   Marland Kitchen Marital Status:   Intimate Partner Violence:   . Fear of Current or Ex-Partner:   . Emotionally Abused:   Marland Kitchen Physically Abused:   . Sexually Abused:       PHYSICAL EXAM  Vitals:   12/30/19 0853  BP: (!) 144/60  Pulse: 66  Temp: (!) 97 F (36.1 C)  Weight: 176 lb (79.8 kg)  Height: 5' 9"  (1.753 m)   Body mass index is 25.99 kg/m.  Generalized: Well developed, in no acute distress  Cardiology: normal rate and rhythm, no murmur noted Respiratory: clear to auscultation bilaterally  Neurological examination  Mentation: Alert oriented to time, place, history taking. Follows all commands speech and language fluent Cranial nerve II-XII: Pupils were equal round reactive to light. Extraocular movements were full, visual field were full on confrontational test. Facial sensation and strength were normal. Uvula tongue midline. Head turning and shoulder shrug  were normal and symmetric. Motor: The motor testing reveals 5 over 5 strength of all 4 extremities. Good symmetric motor tone is noted throughout.  Sensory: Sensory testing is intact to soft touch on all 4 extremities. No evidence of extinction is noted.  Coordination: Cerebellar testing reveals good finger-nose-finger and  heel-to-shin bilaterally.  Gait and station: Gait is normal.   DIAGNOSTIC DATA (LABS, IMAGING, TESTING) - I reviewed patient records, labs, notes, testing and imaging myself where available.  MMSE - Mini Mental State Exam 12/30/2019 07/01/2019 03/07/2019  Not completed: - - -  Orientation to time 3 4 4   Orientation to time comments - - -  Orientation to Place 4 3 4   Registration 3 2 3   Attention/ Calculation 1 0 3  Attention/Calculation-comments - - -  Recall 1 2 1   Language- name 2 objects 2 2 2   Language- repeat 1 1 1   Language- follow 3 step command 3 3 3   Language- read & follow direction 1 1 1   Language-read & follow direction-comments - - -  Write a sentence 1 1 1   Copy design 1 1 0  Copy design-comments - named 4 animals -  Total score 21 20 23      Lab Results  Component Value Date   WBC 6.4 06/02/2016   HGB 15.5 06/02/2016   HCT 44.1 06/02/2016   MCV 96 06/02/2016   PLT 250 06/02/2016      Component Value Date/Time   NA 143 01/28/2019 1027   K 5.1 01/28/2019 1027   CL 106 01/28/2019 1027   CO2 21 01/28/2019 1027   GLUCOSE 90 01/28/2019 1027   GLUCOSE 132 (H) 06/21/2014 0259   BUN 16 01/28/2019 1027   CREATININE 0.98 01/28/2019 1027   CREATININE 0.86 01/16/2013 0845   CALCIUM 9.6 01/28/2019 1027   PROT 6.6 01/28/2019 1027   ALBUMIN 4.4 01/28/2019 1027   AST 15 01/28/2019 1027   ALT 18 01/28/2019 1027   ALKPHOS 54 01/28/2019 1027   BILITOT 0.8 01/28/2019 1027   GFRNONAA 77 01/28/2019 1027   GFRNONAA >89 01/16/2013 0845   GFRAA 89 01/28/2019 1027   GFRAA >89 01/16/2013 0845   Lab Results  Component Value Date   CHOL 108 01/28/2019   HDL 36 (L) 01/28/2019   LDLCALC 54 01/28/2019  TRIG 91 01/28/2019   CHOLHDL 3.0 01/28/2019   Lab Results  Component Value Date   HGBA1C 5.6 01/28/2019   Lab Results  Component Value Date   VITAMINB12 348 08/14/2017   Lab Results  Component Value Date   TSH 2.320 08/14/2017     ASSESSMENT AND PLAN 74 y.o.  year old male  has a past medical history of CAD (coronary artery disease), Cardiomyopathy, ischemic, Cataract, Fracture, ankle (06/12/2014), Hyperlipidemia, Hypertension, Myocardial infarction ([place]) (2015), Nephrolithiasis (01/16/2013), and NSVT (nonsustained ventricular tachycardia) ([place]). here with     ICD-10-CM   1. Dementia without behavioral disturbance, unspecified dementia type ([place])  F03.90     Mr. Bertholf is doing well today.  He continues Aricept and Namenda.  He is tolerating his medications well with no obvious adverse effects.  We will continue current treatment plan.  He was encouraged to stay physically and mentally active.  We have reviewed memory compensation strategies in the office.  He will continue to work on adequate hydration and healthy lifestyle habits.  He will continue close follow-up with primary care.  I have cautioned against driving at night or in unfamiliar areas.  We have also discussed possibility of participating in research in the future if he wishes.  He will follow-up with me in 1 year, sooner if needed.  He and his wife both verbalized understanding and agreement with this plan.   No orders of the defined types were placed in this encounter.    No orders of the defined types were placed in this encounter.     I spent 15 minutes with the patient. 50% of this time was spent counseling and educating patient on plan of care and medications.    Debbora Presto, FNP-C 12/30/2019, 9:11 AM [facility] [address] [place], [place] [address] [phone]   I reviewed the above note and documentation by the Nurse Practitioner and agree with the history, exam, assessment and plan as outlined above. I was available for consultation. Star Age, MD, PhD [facility])

## 2019-12-30 ENCOUNTER — Encounter: Payer: Self-pay | Admitting: Family Medicine

## 2019-12-30 ENCOUNTER — Ambulatory Visit: Payer: Medicare Other | Admitting: Family Medicine

## 2019-12-30 ENCOUNTER — Other Ambulatory Visit: Payer: Self-pay

## 2019-12-30 VITALS — BP 144/60 | HR 66 | Temp 97.0°F | Ht 69.0 in | Wt 176.0 lb

## 2019-12-30 DIAGNOSIS — F0390 Unspecified dementia without behavioral disturbance: Secondary | ICD-10-CM | POA: Diagnosis not present

## 2019-12-30 MED ORDER — MEMANTINE HCL 10 MG PO TABS: 10.0000 mg | ORAL_TABLET | Freq: Two times a day (BID) | ORAL | 3 refills | Status: DC

## 2019-12-30 MED ORDER — DONEPEZIL HCL 10 MG PO TABS: 10.0000 mg | ORAL_TABLET | Freq: Every day | ORAL | 3 refills | Status: DC

## 2019-12-30 NOTE — Patient Instructions (Signed)
We will continue Aricept and Namenda. Stay well hydrated. Focus on staying physically and mentally active. Continue close follow up with PCP. Follow up with me in 1 year, sooner if needed.   Memory Compensation Strategies  1. Use "WARM" strategy.  W= write it down  A= associate it  R= repeat it  M= make a mental note  2.   You can keep a Glass blower/designer.  Use a 3-ring notebook with sections for the following: calendar, important names and phone numbers,  medications, doctors' names/phone numbers, lists/reminders, and a section to journal what you did  each day.   3.    Use a calendar to write appointments down.  4.    Write yourself a schedule for the day.  This can be placed on the calendar or in a separate section of the Memory Notebook.  Keeping a  regular schedule can help memory.  5.    Use medication organizer with sections for each day or morning/evening pills.  You may need help loading it  6.    Keep a basket, or pegboard by the door.  Place items that you need to take out with you in the basket or on the pegboard.  You may also want to  include a message board for reminders.  7.    Use sticky notes.  Place sticky notes with reminders in a place where the task is performed.  For example: " turn off the  stove" placed by the stove, "lock the door" placed on the door at eye level, " take your medications" on  the bathroom mirror or by the place where you normally take your medications.  8.    Use alarms/timers.  Use while cooking to remind yourself to check on food or as a reminder to take your medicine, or as a  reminder to make a call, or as a reminder to perform another task, etc.   Dementia Dementia is a condition that affects the way the brain functions. It often affects memory and thinking. Usually, dementia gets worse with time and cannot be reversed (progressive dementia). There are many types of dementia, including:  Alzheimer's disease. This type is the most  common.  Vascular dementia. This type may happen as the result of a stroke.  Lewy body dementia. This type may happen to people who have Parkinson's disease.  Frontotemporal dementia. This type is caused by damage to nerve cells (neurons) in certain parts of the brain. Some people may be affected by more than one type of dementia. This is called mixed dementia. What are the causes? Dementia is caused by damage to cells in the brain. The area of the brain and the types of cells damaged determine the type of dementia. Usually, this damage is irreversible or cannot be undone. Some examples of irreversible causes include:  Conditions that affect the blood vessels of the brain, such as diabetes, heart disease, or blood vessel disease.  Genetic mutations. In some cases, changes in the brain may be caused by another condition and can be reversed or slowed. Some examples of reversible causes include:  Injury to the brain.  Certain medicines.  Infection, such as meningitis.  Metabolic problems, such as vitamin B12 deficiency or thyroid disease.  Pressure on the brain, such as from a tumor or blood clot. What are the signs or symptoms? Symptoms of dementia depend on the type of dementia. Common signs of dementia include problems with remembering, thinking, problem solving, decision making, and  communicating. These signs develop slowly or get worse with time. This may include:  Problems remembering things.  Having trouble taking a bath or putting clothes on.  Forgetting appointments.  Forgetting to pay bills.  Difficulty planning and preparing meals.  Having trouble speaking.  Getting lost easily. How is this diagnosed? This condition is diagnosed by a specialist (neurologist). It is diagnosed based on the history of your symptoms, your medical history, a physical exam, and tests. Tests may include:  Tests to evaluate brain function, such as memory tests, cognitive tests, and other  tests.  Lab tests, such as blood or urine tests.  Imaging tests, such as a CT scan, a PET scan, or an MRI.  Genetic testing. This may be done if other family members have a diagnosis of certain types of dementia. Your health care provider will talk with you and your family, friends, or caregivers about your history and symptoms. How is this treated?  Treatment for this condition depends on the cause of the dementia. Progressive dementias, such as Alzheimer's disease, cannot be cured, but there may be treatments that help to manage symptoms. Treatment might involve taking medicines that may help to:  Control the dementia.  Slow down the progression of the dementia.  Manage symptoms. In some cases, treating the cause of your dementia can improve symptoms, reverse symptoms, or slow down how quickly your dementia becomes worse. Your health care provider can direct you to support groups, organizations, and other health care providers who can help with decisions about your care. Follow these instructions at home: Medicines  Take over-the-counter and prescription medicines only as told by your health care provider.  Use a pill organizer or pill reminder to help you manage your medicines.  Avoid taking medicines that can affect thinking, such as pain medicines or sleeping medicines. Lifestyle  Make healthy lifestyle choices. ? Be physically active as told by your health care provider. ? Do not use any products that contain nicotine or tobacco, such as cigarettes, e-cigarettes, and chewing tobacco. If you need help quitting, ask your health care provider. ? Do not drink alcohol. ? Practice stress-management techniques when you get stressed. ? Spend time with other people.  Make sure to get quality sleep. These tips can help you get a good night's rest: ? Avoid napping during the day. ? Keep your sleeping area dark and cool. ? Avoid exercising during the few hours before you go to bed.  ? Avoid caffeine products in the evening. Eating and drinking  Drink enough fluid to keep your urine pale yellow.  Eat a healthy diet. General instructions   Work with your health care provider to determine what you need help with and what your safety needs are.  Talk with your health care provider about whether it is safe for you to drive.  If you were given a bracelet that identifies you as a person with memory loss or tracks your location, make sure to wear it at all times.  Work with your family to make important decisions, such as advance directives, medical power of attorney, or a living will.  Keep all follow-up visits as told by your health care provider. This is important. Where to find more information  Alzheimer's Association: CapitalMile.co.nz  National Institute on Aging: DVDEnthusiasts.nl  World Health Organization: RoleLink.com.br Contact a health care provider if:  You have any new or worsening symptoms.  You have problems with choking or swallowing. Get help right away if:  You feel  depressed or sad, or feel that you want to harm yourself.  Your family members become concerned for your safety. If you ever feel like you may hurt yourself or others, or have thoughts about taking your own life, get help right away. You can go to your nearest emergency department or call:  Your local emergency services (911 in the [place]  A suicide crisis helpline, such as the National Suicide Prevention Lifeline at 269-682-7657. This is open 24 hours a day. Summary  Dementia is a condition that affects the way the brain functions. Dementia often affects memory and thinking.  Usually, dementia gets worse with time and cannot be reversed (progressive dementia).  Treatment for this condition depends on the cause of the dementia.  Work with your health care provider to determine what you need help with and what your safety needs are.  Your health care provider can direct  you to support groups, organizations, and other health care providers who can help with decisions about your care. This information is not intended to replace advice given to you by your health care provider. Make sure you discuss any questions you have with your health care provider. Document Revised: 10/30/2018 Document Reviewed: 10/30/2018 Elsevier Patient Education  2020 [employer].

## 2020-01-01 DIAGNOSIS — D1801 Hemangioma of skin and subcutaneous tissue: Secondary | ICD-10-CM | POA: Diagnosis not present

## 2020-01-01 DIAGNOSIS — L821 Other seborrheic keratosis: Secondary | ICD-10-CM | POA: Diagnosis not present

## 2020-01-01 DIAGNOSIS — L819 Disorder of pigmentation, unspecified: Secondary | ICD-10-CM | POA: Diagnosis not present

## 2020-01-01 DIAGNOSIS — L814 Other melanin hyperpigmentation: Secondary | ICD-10-CM | POA: Diagnosis not present

## 2020-01-28 ENCOUNTER — Other Ambulatory Visit: Payer: Self-pay

## 2020-01-28 ENCOUNTER — Encounter: Payer: Self-pay | Admitting: Nurse Practitioner

## 2020-01-28 ENCOUNTER — Ambulatory Visit (INDEPENDENT_AMBULATORY_CARE_PROVIDER_SITE_OTHER): Payer: Medicare Other | Admitting: Nurse Practitioner

## 2020-01-28 VITALS — BP 132/65 | HR 54 | Temp 98.4°F | Resp 20 | Ht 69.0 in | Wt 172.0 lb

## 2020-01-28 DIAGNOSIS — E782 Mixed hyperlipidemia: Secondary | ICD-10-CM | POA: Diagnosis not present

## 2020-01-28 DIAGNOSIS — I25118 Atherosclerotic heart disease of native coronary artery with other forms of angina pectoris: Secondary | ICD-10-CM | POA: Diagnosis not present

## 2020-01-28 DIAGNOSIS — E8881 Metabolic syndrome: Secondary | ICD-10-CM | POA: Diagnosis not present

## 2020-01-28 DIAGNOSIS — I10 Essential (primary) hypertension: Secondary | ICD-10-CM

## 2020-01-28 DIAGNOSIS — F0390 Unspecified dementia without behavioral disturbance: Secondary | ICD-10-CM

## 2020-01-28 DIAGNOSIS — I255 Ischemic cardiomyopathy: Secondary | ICD-10-CM

## 2020-01-28 DIAGNOSIS — I2510 Atherosclerotic heart disease of native coronary artery without angina pectoris: Secondary | ICD-10-CM

## 2020-01-28 DIAGNOSIS — Z6827 Body mass index (BMI) 27.0-27.9, adult: Secondary | ICD-10-CM

## 2020-01-28 LAB — BAYER DCA HB A1C WAIVED: HB A1C (BAYER DCA - WAIVED): 5.1 % (ref <7.0)

## 2020-01-28 MED ORDER — METOPROLOL TARTRATE 25 MG PO TABS: 25.0000 mg | ORAL_TABLET | Freq: Two times a day (BID) | ORAL | 1 refills | Status: DC

## 2020-01-28 MED ORDER — ATORVASTATIN CALCIUM 40 MG PO TABS: ORAL_TABLET | ORAL | 1 refills | Status: DC

## 2020-01-28 MED ORDER — LISINOPRIL 20 MG PO TABS: 20.0000 mg | ORAL_TABLET | Freq: Every day | ORAL | 1 refills | Status: DC

## 2020-01-28 NOTE — Patient Instructions (Signed)

## 2020-01-28 NOTE — Progress Notes (Signed)
Subjective:    Patient ID: SHADE KALEY, male    DOB: [birth year], 74 y.o.   MRN: [id number]   Chief Complaint: medical management of chronic issues    HPI:  1. Essential hypertension No c/o chest pain, Sob or headache. Does not check blood pressure at home. BP Readings from Last 3 Encounters:  12/30/19 (!) 144/60  07/30/19 136/82  07/29/19 122/70     2. Cardiomyopathy, ischemic Saw cardiologist in 06/2019. According to office note, they stopped his effient and he wa to follow up in 1 year.  3. Coronary artery disease of native artery of native heart with stable angina pectoris ([place]) Having no issues as stated previusly.  4. Mixed hyperlipidemia Doe try to watch diet and does very little dedicated exercises. Lab Results  Component Value Date   CHOL 108 01/28/2019   HDL 36 (L) 01/28/2019   LDLCALC 54 01/28/2019   TRIG 91 01/28/2019   CHOLHDL 3.0 01/28/2019     5. Metabolic syndrome He does not check blood sugar at home. Tries not to eat a lot of sweets Lab Results  Component Value Date   HGBA1C 5.6 01/28/2019    6. Dementia without behavioral disturbance, unspecified dementia type [facility]) He says he is holding his own. He is on aricept and namenda. He says sometimes he has hard time getting word out but other then that he is doing ok. Sees neurology every 6 months.  7. BMI 27.0-27.9,adult No weight changes Wt Readings from Last 3 Encounters:  01/28/20 172 lb (78 kg)  12/30/19 176 lb (79.8 kg)  07/30/19 175 lb (79.4 kg)   BMI Readings from Last 3 Encounters:  01/28/20 25.40 kg/m  12/30/19 25.99 kg/m  07/30/19 25.84 kg/m       Outpatient Encounter Medications as of 01/28/2020  Medication Sig   aspirin EC 81 MG EC tablet Take 1 tablet (81 mg total) by mouth daily.   atorvastatin (LIPITOR) 40 MG tablet TAKE 1 TABLET ONCE DAILY AT BEDTIME   donepezil (ARICEPT) 10 MG tablet Take 1 tablet (10 mg total) by mouth at bedtime.   lisinopril (ZESTRIL) 20 MG  tablet Take 1 tablet (20 mg total) by mouth daily.   memantine (NAMENDA) 10 MG tablet Take 1 tablet (10 mg total) by mouth 2 (two) times daily.   metoprolol tartrate (LOPRESSOR) 25 MG tablet Take 1 tablet (25 mg total) by mouth 2 (two) times daily.   nitroGLYCERIN (NITROSTAT) 0.4 MG SL tablet Place 1 tablet (0.4 mg total) under the tongue every 5 (five) minutes x 3 doses as needed for chest pain.   No facility-administered encounter medications on file as of 01/28/2020.    Past Surgical History:  Procedure Laterality Date   ANKLE FRACTURE SURGERY     CARDIAC CATHETERIZATION     CARDIAC CATHETERIZATION Right 06/20/2014   Procedure: CORONARY STENT INTERVENTION;  Surgeon: Peter M Martinique, MD;  Location: [facility] CATH LAB;  Service: Cardiovascular;  Laterality: Right;   CATARACT EXTRACTION Bilateral    CORONARY STENT PLACEMENT     LEFT HEART CATHETERIZATION WITH CORONARY ANGIOGRAM N/A 06/20/2014   Procedure: LEFT HEART CATHETERIZATION WITH CORONARY ANGIOGRAM;  Surgeon: Peter M Martinique, MD;  Location: [facility] CATH LAB;  Service: Cardiovascular;  Laterality: N/A;   NASAL SEPTUM SURGERY     VASECTOMY      Family History  Problem Relation Age of Onset   Hypertension Mother    Cancer Father        bladder ca  Hyperlipidemia Father    Alcohol abuse Father    Hyperlipidemia Sister    Cancer Brother    Nephrolithiasis Brother    Colon cancer Neg Hx    Stomach cancer Neg Hx    Esophageal cancer Neg Hx     New complaints: *none today  Social history: Lives with his wife. Has retired from work due to dementia  Controlled substance contract: n/a    Review of Systems  Constitutional: Negative for diaphoresis.  Eyes: Negative for pain.  Respiratory: Negative for shortness of breath.   Cardiovascular: Negative for chest pain, palpitations and leg swelling.  Gastrointestinal: Negative for abdominal pain.  Endocrine: Negative for polydipsia.  Skin: Negative for rash.    Neurological: Negative for dizziness, weakness and headaches.  Hematological: Does not bruise/bleed easily.  All other systems reviewed and are negative.      Objective:   Physical Exam Vitals and nursing note reviewed.  Constitutional:      Appearance: Normal appearance. He is well-developed.  HENT:     Head: Normocephalic.     Nose: Nose normal.  Eyes:     Pupils: Pupils are equal, round, and reactive to light.  Neck:     Thyroid: No thyroid mass or thyromegaly.     Vascular: No carotid bruit or JVD.     Trachea: Phonation normal.  Cardiovascular:     Rate and Rhythm: Normal rate and regular rhythm.  Pulmonary:     Effort: Pulmonary effort is normal. No respiratory distress.     Breath sounds: Normal breath sounds.  Abdominal:     General: Bowel sounds are normal.     Palpations: Abdomen is soft.     Tenderness: There is no abdominal tenderness.  Musculoskeletal:        General: Normal range of motion.     Cervical back: Normal range of motion and neck supple.  Lymphadenopathy:     Cervical: No cervical adenopathy.  Skin:    General: Skin is warm and dry.  Neurological:     Mental Status: He is alert and oriented to person, place, and time.  Psychiatric:        Behavior: Behavior normal.        Thought Content: Thought content normal.        Judgment: Judgment normal.     BP 132/65    Pulse (!) 54    Temp 98.4 F (36.9 C) (Temporal)    Resp 20    Ht 5' 9"  (1.753 m)    Wt 172 lb (78 kg)    SpO2 97%    BMI 25.40 kg/m       Assessment & Plan:  KRISHIV SANDLER comes in today with chief complaint of Medical Management of Chronic Issues   Diagnosis and orders addressed:  1. Essential hypertension Low sodium diet - lisinopril (ZESTRIL) 20 MG tablet; Take 1 tablet (20 mg total) by mouth daily.  Dispense: 90 tablet; Refill: 1 - CBC with Differential/Platelet - CMP14+EGFR  2. Cardiomyopathy, ischemic Keep yearly follow up with cardiology  3. Coronary artery  disease of native artery of native heart with stable angina pectoris ([place]) Again yearly follow up with cardiology  4. Mixed hyperlipidemia Low fat diet - atorvastatin (LIPITOR) 40 MG tablet; TAKE 1 TABLET ONCE DAILY AT BEDTIME  Dispense: 90 tablet; Refill: 1 - Lipid panel  5. Metabolic syndrome Watch carbs in diet - Bayer DCA Hb A1c Waived  6. Dementia without behavioral disturbance, unspecified dementia  type [facility]) Keep follow up with neurology  7. BMI 27.0-27.9,adult Discussed diet and exercise for person with BMI >25 Will recheck weight in 3-6 months   8. Coronary artery disease involving native coronary artery of native heart without angina pectoris - metoprolol tartrate (LOPRESSOR) 25 MG tablet; Take 1 tablet (25 mg total) by mouth 2 (two) times daily.  Dispense: 180 tablet; Refill: 1   Labs pending Health Maintenance reviewed Diet and exercise encouraged  Follow up plan: 6 months   [name] Hassell Done, FNP

## 2020-01-29 LAB — CMP14+EGFR
ALT: 24 IU/L (ref 0–44)
AST: 19 IU/L (ref 0–40)
Albumin/Globulin Ratio: 2 (ref 1.2–2.2)
Albumin: 4.2 g/dL (ref 3.7–4.7)
Alkaline Phosphatase: 64 IU/L (ref 48–121)
BUN/Creatinine Ratio: 14 (ref 10–24)
BUN: 13 mg/dL (ref 8–27)
Bilirubin Total: 0.6 mg/dL (ref 0.0–1.2)
CO2: 24 mmol/L (ref 20–29)
Calcium: 9 mg/dL (ref 8.6–10.2)
Chloride: 104 mmol/L (ref 96–106)
Creatinine, Ser: 0.92 mg/dL (ref 0.76–1.27)
GFR calc Af Amer: 95 mL/min/{1.73_m2} (ref >59)
GFR calc non Af Amer: 82 mL/min/{1.73_m2} (ref >59)
Globulin, Total: 2.1 g/dL (ref 1.5–4.5)
Glucose: 137 mg/dL — ABNORMAL HIGH (ref 65–99)
Potassium: 5 mmol/L (ref 3.5–5.2)
Sodium: 142 mmol/L (ref 134–144)
Total Protein: 6.3 g/dL (ref 6.0–8.5)

## 2020-01-29 LAB — CBC WITH DIFFERENTIAL/PLATELET
Basophils Absolute: 0.1 10*3/uL (ref 0.0–0.2)
Basos: 1 %
EOS (ABSOLUTE): 0.4 10*3/uL (ref 0.0–0.4)
Eos: 6 %
Hematocrit: 43.2 % (ref 37.5–51.0)
Hemoglobin: 14.9 g/dL (ref 13.0–17.7)
Immature Grans (Abs): 0 10*3/uL (ref 0.0–0.1)
Immature Granulocytes: 0 %
Lymphocytes Absolute: 1.2 10*3/uL (ref 0.7–3.1)
Lymphs: 19 %
MCH: 33.2 pg — ABNORMAL HIGH (ref 26.6–33.0)
MCHC: 34.5 g/dL (ref 31.5–35.7)
MCV: 96 fL (ref 79–97)
Monocytes Absolute: 0.4 10*3/uL (ref 0.1–0.9)
Monocytes: 6 %
Neutrophils Absolute: 4.3 10*3/uL (ref 1.4–7.0)
Neutrophils: 68 %
Platelets: 245 10*3/uL (ref 150–450)
RBC: 4.49 x10E6/uL (ref 4.14–5.80)
RDW: 11.6 % (ref 11.6–15.4)
WBC: 6.3 10*3/uL (ref 3.4–10.8)

## 2020-01-29 LAB — LIPID PANEL
Chol/HDL Ratio: 3 ratio (ref 0.0–5.0)
Cholesterol, Total: 105 mg/dL (ref 100–199)
HDL: 35 mg/dL — ABNORMAL LOW (ref >39)
LDL Chol Calc (NIH): 55 mg/dL (ref 0–99)
Triglycerides: 72 mg/dL (ref 0–149)
VLDL Cholesterol Cal: 15 mg/dL (ref 5–40)

## 2020-03-26 DIAGNOSIS — H524 Presbyopia: Secondary | ICD-10-CM | POA: Diagnosis not present

## 2020-06-10 ENCOUNTER — Ambulatory Visit (INDEPENDENT_AMBULATORY_CARE_PROVIDER_SITE_OTHER): Payer: Medicare Other

## 2020-06-10 DIAGNOSIS — Z0000 Encounter for general adult medical examination without abnormal findings: Secondary | ICD-10-CM | POA: Diagnosis not present

## 2020-06-10 NOTE — Patient Instructions (Signed)
  Adam Rowland , Thank you for taking time to come for your Medicare Wellness Visit. I appreciate your ongoing commitment to your health goals. Please review the following plan we discussed and let me know if I can assist you in the future.   These are the goals we discussed: Goals    . DIET - INCREASE WATER INTAKE     Try to drink 6-8 glasses of water daily.    . Exercise 150 minutes per week (moderate activity)       This is a list of the screening recommended for you and due dates:  Health Maintenance  Topic Date Due  . Flu Shot  03/29/2020  . Colon Cancer Screening  11/30/2022  . Tetanus Vaccine  12/13/2022  . COVID-19 Vaccine  Completed  .  Hepatitis C: One time screening is recommended by Center for Disease Control  (CDC) for  adults born from [birth year] through 1964.   Completed  . Pneumonia vaccines  Completed

## 2020-06-10 NOTE — Progress Notes (Signed)
MEDICARE ANNUAL WELLNESS VISIT  06/10/2020  Telephone Visit Disclaimer This Medicare AWV was conducted by telephone due to national recommendations for restrictions regarding the COVID-19 Pandemic (e.g. social distancing).  I verified, using two identifiers, that I am speaking with Adam Rowland or their authorized healthcare agent. I discussed the limitations, risks, security, and privacy concerns of performing an evaluation and management service by telephone and the potential availability of an in-person appointment in the future. The patient expressed understanding and agreed to proceed.  Location of Patient: Home Location of Provider (nurse):  [facility]  Subjective:    Adam Rowland is a 74 y.o. male patient of Bennie Pierini, FNP who had a Medicare Annual Wellness Visit today via telephone. Adam Rowland lives locally here in [place] with his wife and son. He is a very pleasant man. Before we started the visit his wife spoke with me and asked that we skip over the MMSE portion of the visit. She states that he sees neurology and they complete those types of test.I agreed and we began the visit. 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 He stays very active and enjoys doing yard work. All of his health maintenance is up to date except for his flu shot which he will do soon. He states that he feels his health is the same as it was this time last year.   Patient Care Team: Bennie Pierini, FNP as PCP - General (Nurse Practitioner) Tora Duck, PA-C as Consulting Physician (Physician Assistant) Laqueta Linden, MD (Inactive) as Consulting Physician (Cardiology) Toni Arthurs, MD as Consulting Physician (Orthopedic Surgery) D[place]l [place], OD as Consulting Physician (Optometry) Stephannie Li, MD as Consulting Physician (Ophthalmology)  Advanced Directives 06/10/2020 06/10/2019 06/07/2018 06/06/2017 06/02/2016 05/27/2015 08/07/2014    Does Patient Have a Medical Advance Directive? Yes Yes Yes Yes Yes Yes Yes  Type of Advance Directive Living will Healthcare Power of [place];Living will Living will;Healthcare Power of Attorney Living will Healthcare Power of [place];Living will Healthcare Power of [place];Living will Healthcare Power of [place];Living will  Does patient want to make changes to medical advance directive? No - Patient declined No - Patient declined No - Patient declined No - Patient declined No - Patient declined No - Patient declined No - Patient declined  Copy of Healthcare Power of Attorney in Chart? - No - copy requested No - copy requested No - copy requested No - copy requested No - copy requested No - copy requested  Would patient like information on creating a medical advance directive? - - - - - [facility] Utilization Over the Past 12 Months: # of hospitalizations or ER visits: 0 # of surgeries: 0  Review of Systems    Patient reports that his overall health is unchanged compared to last year.  History obtained from chart review  Patient Reported Readings (BP, Pulse, CBG, Weight, etc) none  Pain Assessment Pain : No/denies pain     Current Medications & Allergies (verified) Allergies as of 06/10/2020   No Known Allergies     Medication List       Accurate as of June 10, 2020  9:27 AM. If you have any questions, ask your nurse or doctor.        aspirin 81 MG EC tablet Take 1 tablet (81 mg total) by mouth daily.   atorvastatin 40 MG tablet Commonly known as: LIPITOR TAKE 1 TABLET ONCE DAILY AT BEDTIME  donepezil 10 MG tablet Commonly known as: ARICEPT Take 1 tablet (10 mg total) by mouth at bedtime.   lisinopril 20 MG tablet Commonly known as: ZESTRIL Take 1 tablet (20 mg total) by mouth daily.   memantine 10 MG tablet Commonly known as: Namenda Take 1 tablet (10 mg total) by mouth 2 (two) times daily.   metoprolol tartrate 25 MG tablet Commonly known as:  LOPRESSOR Take 1 tablet (25 mg total) by mouth 2 (two) times daily.   nitroGLYCERIN 0.4 MG SL tablet Commonly known as: NITROSTAT Place 1 tablet (0.4 mg total) under the tongue every 5 (five) minutes x 3 doses as needed for chest pain.       History (reviewed): Past Medical History:  Diagnosis Date  . CAD (coronary artery disease)    a. 05/2014 NSTEMI/PCI: LM nl, LAD 68m (3.0x23 Xience Alpine DES), D1 90, LCX 51m, OM1 80, RCA 30p/d, RPDA 40-50p, EF 40-45% ->Plan for staged PCI of LCX/OM1.  . Cardiomyopathy, ischemic    a. 05/2014 Echo: Ef 45-50%, mid-apicalanteroseptal AK, Gr 2 DD.  Marland Kitchen Cataract   . Fracture, ankle 06/12/2014  . Hyperlipidemia   . Hypertension   . Myocardial infarction [facility]) 2015   non STEMI  . Nephrolithiasis 01/16/2013  . NSVT (nonsustained ventricular tachycardia) (HCC)    a. 05/2014 in setting of NSTEMI -  asymptomatic.   Past Surgical History:  Procedure Laterality Date  . ANKLE FRACTURE SURGERY    . CARDIAC CATHETERIZATION    . CARDIAC CATHETERIZATION Right 06/20/2014   Procedure: CORONARY STENT INTERVENTION;  Surgeon: Peter M Swaziland, MD;  Location: [facility] C[facility] LAB;  Service: Cardiovascular;  Laterality: Right;  . CATARACT EXTRACTION Bilateral   . CORONARY STENT PLACEMENT    . LEFT HEART CATHETERIZATION WITH CORONARY ANGIOGRAM N/A 06/20/2014   Procedure: LEFT HEART CATHETERIZATION WITH CORONARY ANGIOGRAM;  Surgeon: Peter M Swaziland, MD;  Location: [facility] CATH LAB;  Service: Cardiovascular;  Laterality: N/A;  . NASAL SEPTUM SURGERY    . VASECTOMY     Family History  Problem Relation Age of Onset  . Hypertension Mother   . Cancer Father        bladder ca  . Hyperlipidemia Father   . Alcohol abuse Father   . Hyperlipidemia Sister   . Cancer Brother   . Nephrolithiasis Brother   . Colon cancer Neg Hx   . Stomach cancer Neg Hx   . Esophageal cancer Neg Hx    Social History   Socioeconomic History  . Marital status: Married    Spouse name: [name]  .  Number of children: 1  . Years of education: 35  . Highest education level: Bachelor's degree (e.g., BA, AB, BS)  Occupational History  . Occupation: Retired  Tobacco Use  . Smoking status: Former Smoker    Packs/day: 1.00    Years: 23.00    Pack years: 23.00    Types: Cigarettes    Start date: 10/26/1964    Quit date: 08/30/1987    Years since quitting: 32.8  . Smokeless tobacco: Former Neurosurgeon    Types: Chew    Quit date: 02/27/1988  . Tobacco comment: quit in 1989, smoked since he was a teenager/chewed tobacco for about 6 months after he quit smoking cigarettes  Vaping Use  . Vaping Use: Never used  Substance and Sexual Activity  . Alcohol use: Yes    Alcohol/week: 7.0 standard drinks    Types: 7 Glasses of wine per week    Comment:  1 glass of wine nightly   . Drug use: No  . Sexual activity: Not Currently  Other Topics Concern  . 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 He has one son and one granddaughter.    Social Determinants of Health   Financial Resource Strain:   . Difficulty of Paying Living Expenses: Not on file  Food Insecurity:   . Worried About Programme researcher, broadcasting/film/video in the Last Year: Not on file  . Ran Out of Food in the Last Year: Not on file  Transportation Needs:   . Lack of Transportation (Medical): Not on file  . Lack of Transportation (Non-Medical): Not on file  Physical Activity:   . Days of Exercise per Week: Not on file  . Minutes of Exercise per Session: Not on file  Stress:   . Feeling of Stress : Not on file  Social Connections:   . Frequency of Communication with Friends and Family: Not on file  . Frequency of Social Gatherings with Friends and Family: Not on file  . Attends Religious Services: Not on file  . Active Member of Clubs or Organizations: Not on file  . Attends Banker Meetings: Not on file  . Marital Status: Not on file    Activities of Daily Living In your present state of  health, do you have any difficulty performing the following activities: 06/10/2020  Hearing? Y  Vision? N  Difficulty concentrating or making decisions? N  Walking or climbing stairs? N  Dressing or bathing? N  Doing errands, shopping? N  Preparing Food and eating ? N  Using the Toilet? N  In the past six months, have you accidently leaked urine? N  Do you have problems with loss of bowel control? N  Managing your Medications? N  Managing your Finances? N  Housekeeping or managing your Housekeeping? N  Some recent data might be hidden    Patient Education/ Literacy How often do you need to have someone help you when you read instructions, pamphlets, or other written materials from your doctor or pharmacy?: 1 - Never What is the last grade level you completed in school?: 4 years college  Exercise Current Exercise Habits: The patient does not participate in regular exercise at present, Exercise limited by: None identified  Diet Patient reports consuming 3 meals a day and 2 snack(s) a day Patient reports that his primary diet is: Regular Patient reports that she does have regular access to food.   Depression Screen PHQ 2/9 Scores 01/28/2020 07/30/2019 06/10/2019 01/28/2019 07/23/2018 06/07/2018 01/04/2018  PHQ - 2 Score 0 0 0 0 0 0 0     Fall Risk Fall Risk  06/10/2020 01/28/2020 07/30/2019 06/10/2019 01/28/2019  Falls in the past year? 0 0 0 0 0  Number falls in past yr: - - - 0 -  Injury with Fall? - - - 0 -  Comment - - - - -  Risk Factor Category  - - - - -  Risk for fall due to : - - - - -  Follow up - - - Falls prevention discussed -  Comment - - - Get rid of all throw rugs in the house, adequate lighting in the walkways and grab bars in the bathroom. -     Objective:  Adam Rowland seemed alert and oriented and he participated appropriately during our telephone visit.  Blood Pressure Weight BMI  BP Readings from  Last 3 Encounters:  01/28/20 132/65  12/30/19 (!) 144/60    07/30/19 136/82   Wt Readings from Last 3 Encounters:  01/28/20 172 lb (78 kg)  12/30/19 176 lb (79.8 kg)  07/30/19 175 lb (79.4 kg)   BMI Readings from Last 1 Encounters:  01/28/20 25.40 kg/m    *Unable to obtain current vital signs, weight, and BMI due to telephone visit type  Hearing/Vision  . Adam Rowland did not seem to have difficulty with hearing/understanding during the telephone conversation . Reports that he has had a formal eye exam by an eye care professional within the past year . Reports that he has not had a formal hearing evaluation within the past year *Unable to fully assess hearing and vision during telephone visit type  Cognitive Function: 6CIT Screen 06/10/2019  What Year? 0 points  What month? 0 points  What time? 0 points  Count back from 20 0 points  Months in reverse 0 points  Repeat phrase 0 points  Total Score 0   (Normal:0-7, Significant for Dysfunction: >8)  Normal Cognitive Function Screening: No: Not done due to wifes request. She wanted [place] to skip over this part of the visit and states that his neurologist takes care of this   Immunization & Health Maintenance Record Immunization History  Administered Date(s) Administered  . Fluad Quad(high Dose 65+) 06/20/2019  . Influenza Split 10/08/2012, 07/09/2013  . Influenza, High Dose Seasonal PF 06/02/2016, 07/05/2017, 06/13/2018  . Influenza,inj,Quad PF,6+ Mos 06/21/2014, 05/27/2015  . PFIZER SARS-COV-2 Vaccination 09/18/2019, 10/09/2019  . Pneumococcal Conjugate-13 02/16/2015  . Pneumococcal Polysaccharide-23 12/12/2012  . Tdap 12/12/2012  . Zoster 10/08/2012  . Zoster Recombinat (Shingrix) 06/13/2018    Health Maintenance  Topic Date Due  . INFLUENZA VACCINE  03/29/2020  . COLONOSCOPY  11/30/2022  . TETANUS/TDAP  12/13/2022  . COVID-19 Vaccine  Completed  . Hepatitis C Screening  Completed  . PNA vac Low Risk Adult  Completed       Assessment  This is a routine wellness examination for  Adam Rowland.  Health Maintenance: Due or Overdue Health Maintenance Due  Topic Date Due  . INFLUENZA VACCINE  03/29/2020    Adam Rowland does not need a referral for Community Assistance: Care Management:   no Social Work:    no Prescription Assistance:  no Nutrition/Diabetes Education:  no   Plan:  Personalized Goals Goals Addressed   None    Personalized Health Maintenance & Screening Recommendations  Influenza vaccine  Lung Cancer Screening Recommended: no (Low Dose CT Chest recommended if Age 55-80 years, 30 pack-year currently smoking OR have quit w/in past 15 years) Hepatitis C Screening recommended: no HIV Screening recommended: no  Advanced Directives: Written information was not prepared per patient's request.  Referrals & Orders No orders of the defined types were placed in this encounter.   Follow-up Plan . Follow-up with Bennie Pierini, FNP as planned . Schedule for a flu shot    I have personally reviewed and noted the following in the patient's chart:   . Medical and social history . Use of alcohol, tobacco or illicit drugs  . Current medications and supplements . Functional ability and status . Nutritional status . Physical activity . Advanced directives . List of other physicians . Hospitalizations, surgeries, and ER visits in previous 12 months . Vitals . Screenings to include cognitive, depression, and falls . Referrals and appointments  In addition, I have reviewed and discussed with Adam Rowland certain  preventive protocols, quality metrics, and best practice recommendations. A written personalized care plan for preventive services as well as general preventive health recommendations is available and can be mailed to the patient at his request.      Cleda Daub   LPN  [id number]

## 2020-07-08 DIAGNOSIS — L819 Disorder of pigmentation, unspecified: Secondary | ICD-10-CM | POA: Diagnosis not present

## 2020-07-08 DIAGNOSIS — L905 Scar conditions and fibrosis of skin: Secondary | ICD-10-CM | POA: Diagnosis not present

## 2020-07-08 DIAGNOSIS — Z85828 Personal history of other malignant neoplasm of skin: Secondary | ICD-10-CM | POA: Diagnosis not present

## 2020-07-08 DIAGNOSIS — L570 Actinic keratosis: Secondary | ICD-10-CM | POA: Diagnosis not present

## 2020-07-29 ENCOUNTER — Encounter: Payer: Self-pay | Admitting: Nurse Practitioner

## 2020-07-29 ENCOUNTER — Ambulatory Visit (INDEPENDENT_AMBULATORY_CARE_PROVIDER_SITE_OTHER): Payer: Medicare Other | Admitting: Nurse Practitioner

## 2020-07-29 ENCOUNTER — Other Ambulatory Visit: Payer: Self-pay

## 2020-07-29 VITALS — BP 139/74 | HR 54 | Temp 98.0°F | Resp 20 | Ht 69.0 in | Wt 167.0 lb

## 2020-07-29 DIAGNOSIS — I2510 Atherosclerotic heart disease of native coronary artery without angina pectoris: Secondary | ICD-10-CM

## 2020-07-29 DIAGNOSIS — E782 Mixed hyperlipidemia: Secondary | ICD-10-CM | POA: Diagnosis not present

## 2020-07-29 DIAGNOSIS — E8881 Metabolic syndrome: Secondary | ICD-10-CM

## 2020-07-29 DIAGNOSIS — I10 Essential (primary) hypertension: Secondary | ICD-10-CM

## 2020-07-29 DIAGNOSIS — F0390 Unspecified dementia without behavioral disturbance: Secondary | ICD-10-CM | POA: Diagnosis not present

## 2020-07-29 DIAGNOSIS — I255 Ischemic cardiomyopathy: Secondary | ICD-10-CM | POA: Diagnosis not present

## 2020-07-29 DIAGNOSIS — I25118 Atherosclerotic heart disease of native coronary artery with other forms of angina pectoris: Secondary | ICD-10-CM

## 2020-07-29 DIAGNOSIS — Z6827 Body mass index (BMI) 27.0-27.9, adult: Secondary | ICD-10-CM

## 2020-07-29 LAB — LIPID PANEL

## 2020-07-29 MED ORDER — LISINOPRIL 20 MG PO TABS: 20.0000 mg | ORAL_TABLET | Freq: Every day | ORAL | 1 refills | Status: DC

## 2020-07-29 MED ORDER — ATORVASTATIN CALCIUM 40 MG PO TABS: ORAL_TABLET | ORAL | 1 refills | Status: DC

## 2020-07-29 MED ORDER — METOPROLOL TARTRATE 25 MG PO TABS: 25.0000 mg | ORAL_TABLET | Freq: Two times a day (BID) | ORAL | 1 refills | Status: DC

## 2020-07-29 NOTE — Progress Notes (Addendum)
Subjective:    Patient ID: Adam Rowland, male    DOB: [birth year], 74 y.o.   MRN: [id number]   Chief Complaint: Medical Management of Chronic Issues    HPI:  1. Primary hypertension Does not regularly check BP at home. Denies chest pain, SOB, dizziness, headaches. Does watch salt intake.  BP Readings from Last 3 Encounters:  07/29/20 139/74  01/28/20 132/65  12/30/19 (!) 144/60    2. Cardiomyopathy, ischemic Sees cardiologist. Last appt was 06/2019. Echo was done 08/2019 that was normal. Not sure of next appointment.  3. Coronary artery disease of native artery of native heart with stable angina pectoris ([place]) Denies chest pain. Sees cardiologist and is doing welll.  4. Dementia without behavioral disturbance, unspecified dementia type [facility]) Sees neurologist. Last visit was in 12/2019 with no changes to treatment plan. Will follow up in 1 year. No changes in function since that visit.   5. Mixed hyperlipidemia Does try to limit intake of fried and fatty foods. Uses treadmill and exercise bike 15-30 minutes daily. Takes lipitor daily. Lab Results  Component Value Date   CHOL 105 01/28/2020   HDL 35 (L) 01/28/2020   LDLCALC 55 01/28/2020   TRIG 72 01/28/2020   CHOLHDL 3.0 01/28/2020    6. BMI 27.0-27.9,adult Has lost 5 pounds since last visit. His BMI is 24.66 now which is in a healthy weight range. Wt Readings from Last 3 Encounters:  07/29/20 167 lb (75.8 kg)  01/28/20 172 lb (78 kg)  12/30/19 176 lb (79.8 kg)   BMI Readings from Last 3 Encounters:  07/29/20 24.66 kg/m  01/28/20 25.40 kg/m  12/30/19 25.99 kg/m    7. Metabolic syndrome Tries to watch sweets in diet. Does not check blood sugars  Outpatient Encounter Medications as of 07/29/2020  Medication Sig  . aspirin EC 81 MG EC tablet Take 1 tablet (81 mg total) by mouth daily.  Marland Kitchen atorvastatin (LIPITOR) 40 MG tablet TAKE 1 TABLET ONCE DAILY AT BEDTIME  . donepezil (ARICEPT) 10 MG tablet Take 1 tablet (10  mg total) by mouth at bedtime.  Marland Kitchen lisinopril (ZESTRIL) 20 MG tablet Take 1 tablet (20 mg total) by mouth daily.  . memantine (NAMENDA) 10 MG tablet Take 1 tablet (10 mg total) by mouth 2 (two) times daily.  . metoprolol tartrate (LOPRESSOR) 25 MG tablet Take 1 tablet (25 mg total) by mouth 2 (two) times daily.  . nitroGLYCERIN (NITROSTAT) 0.4 MG SL tablet Place 1 tablet (0.4 mg total) under the tongue every 5 (five) minutes x 3 doses as needed for chest pain.   No facility-administered encounter medications on file as of 07/29/2020.    Past Surgical History:  Procedure Laterality Date  . ANKLE FRACTURE SURGERY    . CARDIAC CATHETERIZATION    . CARDIAC CATHETERIZATION Right 06/20/2014   Procedure: CORONARY STENT INTERVENTION;  Surgeon: Peter M Martinique, MD;  Location: [facility] CATH LAB;  Service: Cardiovascular;  Laterality: Right;  . CATARACT EXTRACTION Bilateral   . CORONARY STENT PLACEMENT    . LEFT HEART CATHETERIZATION WITH CORONARY ANGIOGRAM N/A 06/20/2014   Procedure: LEFT HEART CATHETERIZATION WITH CORONARY ANGIOGRAM;  Surgeon: Peter M Martinique, MD;  Location: [facility] CATH LAB;  Service: Cardiovascular;  Laterality: N/A;  . NASAL SEPTUM SURGERY    . VASECTOMY      Family History  Problem Relation Age of Onset  . Hypertension Mother   . Cancer Father        bladder ca  .  Hyperlipidemia Father   . Alcohol abuse Father   . Hyperlipidemia Sister   . Cancer Brother   . Nephrolithiasis Brother   . Colon cancer Neg Hx   . Stomach cancer Neg Hx   . Esophageal cancer Neg Hx     New complaints: Woke up with left eye having broken blood vessels one morning about a month ago. Has an appointment scheduled with eye doctor but not for about a month or so. Denies any known injury. Denies pain, impaired vision.  Social history: Lives with wife.  Controlled substance contract: N/A   Review of Systems  Constitutional: Negative.   HENT: Negative.   Eyes: Positive for redness (left eye).    Respiratory: Negative.   Cardiovascular: Negative.   Gastrointestinal: Negative.   Genitourinary: Negative.   Musculoskeletal: Negative.   Skin: Negative.   Neurological: Negative.   Psychiatric/Behavioral: Positive for confusion.       Objective:   Physical Exam Vitals and nursing note reviewed.  HENT:     Head: Normocephalic.     Right Ear: Tympanic membrane normal.     Left Ear: Tympanic membrane normal.     Nose: Nose normal.     Mouth/Throat:     Mouth: Mucous membranes are moist.     Pharynx: Oropharynx is clear.  Eyes:     Conjunctiva/sclera:     Left eye: Hemorrhage present.     Pupils: Pupils are equal, round, and reactive to light.   Cardiovascular:     Rate and Rhythm: Normal rate and regular rhythm.     Pulses: Normal pulses.     Heart sounds: Normal heart sounds.  Pulmonary:     Effort: Pulmonary effort is normal.     Breath sounds: Normal breath sounds.  Abdominal:     General: Bowel sounds are normal.     Palpations: Abdomen is soft.  Musculoskeletal:        General: Normal range of motion.     Cervical back: Normal range of motion and neck supple.  Skin:    General: Skin is warm and dry.     Capillary Refill: Capillary refill takes less than 2 seconds.  Neurological:     Mental Status: He is alert and oriented to person, place, and time. Mental status is at baseline.  Psychiatric:        Behavior: Behavior normal.        Cognition and Memory: Memory is impaired.   BP 139/74   Pulse (!) 54   Temp 98 F (36.7 C) (Temporal)   Resp 20   Ht _0  (1.753 m)   Wt 167 lb (75.8 kg)   SpO2 100%   BMI 24.66 kg/m       Assessment & Plan:  Adam Rowland comes in today with chief complaint of Medical Management of Chronic Issues   Diagnosis and orders addressed:  1. Primary hypertension Low sodium diet  2. Cardiomyopathy, ischemic Follow up with cardiologist as directed  3. Coronary artery disease of native artery of native heart with stable  angina pectoris ([place]) Follow up with cardiologist  4. Dementia without behavioral disturbance, unspecified dementia type ([place]) Follow up with neurologist  5. Mixed hyperlipidemia Low fat diet  6. BMI 27.0-27.9,adult Discussed diet and exercise for person with BMI >25 Will recheck weight in 3-6 months  7. Metabolic syndrome  Watch carbs in diet  Meds ordered this encounter  Medications  . metoprolol tartrate (LOPRESSOR) 25 MG tablet  Sig: Take 1 tablet (25 mg total) by mouth 2 (two) times daily.    Dispense:  180 tablet    Refill:  1    $    Order Specific Question:   Supervising Provider    Answer:   Caryl Pina A [id number]  . lisinopril (ZESTRIL) 20 MG tablet    Sig: Take 1 tablet (20 mg total) by mouth daily.    Dispense:  90 tablet    Refill:  1    Order Specific Question:   Supervising Provider    Answer:   Caryl Pina A [id number]  . atorvastatin (LIPITOR) 40 MG tablet    Sig: TAKE 1 TABLET ONCE DAILY AT BEDTIME    Dispense:  90 tablet    Refill:  1    Order Specific Question:   Supervising Provider    Answer:   Caryl Pina A [[id number]]   Orders Placed This Encounter  Procedures  . CBC with Differential/Platelet  . CMP14+EGFR  . Lipid panel    Labs pending Health Maintenance reviewed Diet and exercise encouraged  Follow up plan: 6 months    [name] Hassell Done, FNP

## 2020-07-29 NOTE — Patient Instructions (Addendum)
Fat and Cholesterol Restricted Eating Plan Eating a diet that limits fat and cholesterol may help lower your risk for heart disease and other conditions. Your body needs fat and cholesterol for basic functions, but eating too much of these things can be harmful to your health. Your health care provider may order lab tests to check your blood fat (lipid) and cholesterol levels. This helps your health care provider understand your risk for certain conditions and whether you need to make diet changes. Work with your health care provider or dietitian to make an eating plan that is right for you. Your plan includes:  Limit your fat intake to ______% or less of your total calories a day.  Limit your saturated fat intake to ______% or less of your total calories a day.  Limit the amount of cholesterol in your diet to less than _________mg a day.  Eat ___________ g of fiber a day. What are tips for following this plan? General guidelines   If you are overweight, work with your health care provider to lose weight safely. Losing just 5-10% of your body weight can improve your overall health and help prevent diseases such as diabetes and heart disease.  Avoid: ? Foods with added sugar. ? Fried foods. ? Foods that contain partially hydrogenated oils, including stick margarine, some tub margarines, cookies, crackers, and other baked goods.  Limit alcohol intake to no more than 1 drink a day for nonpregnant women and 2 drinks a day for men. One drink equals 12 oz of beer, 5 oz of wine, or 1 oz of hard liquor. Reading food labels  Check food labels for: ? Trans fats, partially hydrogenated oils, or high amounts of saturated fat. Avoid foods that contain saturated fat and trans fat. ? The amount of cholesterol in each serving. Try to eat no more than 200 mg of cholesterol each day. ? The amount of fiber in each serving. Try to eat at least 20-30 g of fiber each day.  Choose foods with healthy fats,  such as: ? Monounsaturated and polyunsaturated fats. These include olive and canola oil, flaxseeds, walnuts, almonds, and seeds. ? Omega-3 fats. These are found in foods such as salmon, mackerel, sardines, tuna, flaxseed oil, and ground flaxseeds.  Choose grain products that have whole grains. Look for the word "whole" as the first word in the ingredient list. Cooking  Cook foods using methods other than frying. Baking, boiling, grilling, and broiling are some healthy options.  Eat more home-cooked food and less restaurant, buffet, and fast food.  Avoid cooking using saturated fats. ? Animal sources of saturated fats include meats, butter, and cream. ? Plant sources of saturated fats include palm oil, palm kernel oil, and coconut oil. Meal planning   At meals, imagine dividing your plate into fourths: ? Fill one-half of your plate with vegetables and green salads. ? Fill one-fourth of your plate with whole grains. ? Fill one-fourth of your plate with lean protein foods.  Eat fish that is high in omega-3 fats at least two times a week.  Eat more foods that contain fiber, such as whole grains, beans, apples, broccoli, carrots, peas, and barley. These foods help promote healthy cholesterol levels in the blood. Recommended foods Grains  Whole grains, such as whole wheat or whole grain breads, crackers, cereals, and pasta. Unsweetened oatmeal, bulgur, barley, quinoa, or brown rice. Corn or whole wheat flour tortillas. Vegetables  Fresh or frozen vegetables (raw, steamed, roasted, or grilled). Green salads. Fruits    All fresh, canned (in natural juice), or frozen fruits. Meats and other protein foods  Ground beef (85% or leaner), grass-fed beef, or beef trimmed of fat. Skinless chicken or [place]. Ground chicken or [place]. Pork trimmed of fat. All fish and seafood. Egg whites. Dried beans, peas, or lentils. Unsalted nuts or seeds. Unsalted canned beans. Natural nut butters without added  sugar and oil. Dairy  Low-fat or nonfat dairy products, such as skim or 1% milk, 2% or reduced-fat cheeses, low-fat and fat-free ricotta or cottage cheese, or plain low-fat and nonfat yogurt. Fats and oils  Tub margarine without trans fats. Light or reduced-fat mayonnaise and salad dressings. Avocado. Olive, canola, sesame, or safflower oils. The items listed above may not be a complete list of recommended foods or beverages. Contact your dietitian for more options. Foods to avoid Grains  White bread. White pasta. White rice. Cornbread. Bagels, pastries, and croissants. Crackers and snack foods that contain trans fat and hydrogenated oils. Vegetables  Vegetables cooked in cheese, cream, or butter sauce. Fried vegetables. Fruits  Canned fruit in heavy syrup. Fruit in cream or butter sauce. Fried fruit. Meats and other protein foods  Fatty cuts of meat. Ribs, chicken wings, bacon, sausage, bologna, salami, chitterlings, fatback, hot dogs, bratwurst, and packaged lunch meats. Liver and organ meats. Whole eggs and egg yolks. Chicken and [place] with skin. Fried meat. Dairy  Whole or 2% milk, cream, half-and-half, and cream cheese. Whole milk cheeses. Whole-fat or sweetened yogurt. Full-fat cheeses. Nondairy creamers and whipped toppings. Processed cheese, cheese spreads, and cheese curds. Beverages  Alcohol. Sugar-sweetened drinks such as sodas, lemonade, and fruit drinks. Fats and oils  Butter, stick margarine, lard, shortening, ghee, or bacon fat. Coconut, palm kernel, and palm oils. Sweets and desserts  Corn syrup, sugars, honey, and molasses. Candy. Jam and jelly. Syrup. Sweetened cereals. Cookies, pies, cakes, donuts, muffins, and ice cream. The items listed above may not be a complete list of foods and beverages to avoid. Contact your dietitian for more information. Summary  Your body needs fat and cholesterol for basic functions. However, eating too much of these things can be  harmful to your health.  Work with your health care provider and dietitian to follow a diet low in fat and cholesterol. Doing this may help lower your risk for heart disease and other conditions.  Choose healthy fats, such as monounsaturated and polyunsaturated fats, and foods high in omega-3 fatty acids.  Eat fiber-rich foods, such as whole grains, beans, peas, fruits, and vegetables.  Limit or avoid alcohol, fried foods, and foods high in saturated fats, partially hydrogenated oils, and sugar. This information is not intended to replace advice given to you by your health care provider. Make sure you discuss any questions you have with your health care provider. Document Revised: 07/28/2017 Document Reviewed: 05/02/2017 Elsevier Patient Education  2020 [employer]. Subconjunctival Hemorrhage Subconjunctival hemorrhage is bleeding that happens between the white part of your eye (sclera) and the clear membrane that covers the outside of your eye (conjunctiva). There are many tiny blood vessels near the surface of your eye. A subconjunctival hemorrhage happens when one or more of these vessels breaks and bleeds, causing a red patch to appear on your eye. This is similar to a bruise. Depending on the amount of bleeding, the red patch may only cover a small area of your eye or it may cover the entire visible part of the sclera. If a lot of blood collects under the conjunctiva, there  may also be swelling. Subconjunctival hemorrhages do not affect your vision or cause pain, but your eye may feel irritated if there is swelling. Subconjunctival hemorrhages usually do not require treatment, and they usually disappear on their own within two weeks. What are the causes? This condition may be caused by:  Mild trauma, such as rubbing your eye too hard.  Blunt injuries, such as from playing sports or having contact with a deployed airbag.  Coughing, sneezing, or vomiting.  Straining, such as when lifting  a heavy object.  High blood pressure.  Recent eye surgery.  Diabetes.  Certain medicines, especially blood thinners (anticoagulants).  Other conditions, such as eye tumors, bleeding disorders, or blood vessel abnormalities. Subconjunctival hemorrhages can also happen without an obvious cause. What are the signs or symptoms? Symptoms of this condition include:  A bright red or dark red patch on the white part of the eye. The red area may: ? Spread out to cover a larger area of the eye before it goes away. ? Turn brownish-yellow before it goes away.  Swelling around the eye.  Mild eye irritation. How is this diagnosed? This condition is diagnosed with a physical exam. If your subconjunctival hemorrhage was caused by trauma, your health care provider may refer you to an eye specialist (ophthalmologist) or another specialist to check for other injuries. You may have other tests, including:  An eye exam.  A blood pressure check.  Blood tests to check for bleeding disorders. If your subconjunctival hemorrhage was caused by trauma, X-rays or a CT scan may be done to check for other injuries. How is this treated? Usually, treatment is not needed for this condition. If you have discomfort, your health care provider may recommend eye drops or cold compresses. Follow these instructions at home:  Take over-the-counter and prescription medicines only as directed by your health care provider.  Use eye drops or cold compresses to help with discomfort as directed by your health care provider.  Avoid activities, things, and environments that may irritate or injure your eye.  Keep all follow-up visits as told by your health care provider. This is important. Contact a health care provider if:  You have pain in your eye.  The bleeding does not go away within 3 weeks.  You keep getting new subconjunctival hemorrhages. Get help right away if:  Your vision changes or you have difficulty  seeing.  You suddenly develop severe sensitivity to light.  You develop a severe headache, persistent vomiting, confusion, or abnormal tiredness (lethargy).  Your eye seems to bulge or protrude from your eye socket.  You develop unexplained bruises on your body.  You have unexplained bleeding in another area of your body. Summary  Subconjunctival hemorrhage is bleeding that happens between the white part of your eye and the clear membrane that covers the outside of your eye.  This condition is similar to a bruise.  Subconjunctival hemorrhages usually do not require treatment, and they usually disappear on their own within two weeks.  Use eye drops or cold compresses to help with discomfort as directed by your health care provider. This information is not intended to replace advice given to you by your health care provider. Make sure you discuss any questions you have with your health care provider. Document Revised: 01/30/2019 Document Reviewed: 05/16/2018 Elsevier Patient Education  2020 [employer].

## 2020-07-30 LAB — LIPID PANEL
Chol/HDL Ratio: 2.9 ratio (ref 0.0–5.0)
Cholesterol, Total: 123 mg/dL (ref 100–199)
HDL: 43 mg/dL (ref >39)
LDL Chol Calc (NIH): 66 mg/dL (ref 0–99)
Triglycerides: 65 mg/dL (ref 0–149)
VLDL Cholesterol Cal: 14 mg/dL (ref 5–40)

## 2020-07-30 LAB — CMP14+EGFR
ALT: 25 IU/L (ref 0–44)
AST: 21 IU/L (ref 0–40)
Albumin/Globulin Ratio: 1.8 (ref 1.2–2.2)
Albumin: 4.2 g/dL (ref 3.7–4.7)
Alkaline Phosphatase: 60 IU/L (ref 44–121)
BUN/Creatinine Ratio: 13 (ref 10–24)
BUN: 14 mg/dL (ref 8–27)
Bilirubin Total: 0.9 mg/dL (ref 0.0–1.2)
CO2: 24 mmol/L (ref 20–29)
Calcium: 9.5 mg/dL (ref 8.6–10.2)
Chloride: 106 mmol/L (ref 96–106)
Creatinine, Ser: 1.04 mg/dL (ref 0.76–1.27)
GFR calc Af Amer: 82 mL/min/{1.73_m2} (ref >59)
GFR calc non Af Amer: 71 mL/min/{1.73_m2} (ref >59)
Globulin, Total: 2.3 g/dL (ref 1.5–4.5)
Glucose: 128 mg/dL — ABNORMAL HIGH (ref 65–99)
Potassium: 5.1 mmol/L (ref 3.5–5.2)
Sodium: 144 mmol/L (ref 134–144)
Total Protein: 6.5 g/dL (ref 6.0–8.5)

## 2020-07-30 LAB — CBC WITH DIFFERENTIAL/PLATELET
Basophils Absolute: 0.1 10*3/uL (ref 0.0–0.2)
Basos: 1 %
EOS (ABSOLUTE): 0.2 10*3/uL (ref 0.0–0.4)
Eos: 3 %
Hematocrit: 44.6 % (ref 37.5–51.0)
Hemoglobin: 15.9 g/dL (ref 13.0–17.7)
Immature Grans (Abs): 0 10*3/uL (ref 0.0–0.1)
Immature Granulocytes: 0 %
Lymphocytes Absolute: 1.3 10*3/uL (ref 0.7–3.1)
Lymphs: 21 %
MCH: 34.5 pg — ABNORMAL HIGH (ref 26.6–33.0)
MCHC: 35.7 g/dL (ref 31.5–35.7)
MCV: 97 fL (ref 79–97)
Monocytes Absolute: 0.4 10*3/uL (ref 0.1–0.9)
Monocytes: 7 %
Neutrophils Absolute: 4.1 10*3/uL (ref 1.4–7.0)
Neutrophils: 68 %
Platelets: 224 10*3/uL (ref 150–450)
RBC: 4.61 x10E6/uL (ref 4.14–5.80)
RDW: 11.6 % (ref 11.6–15.4)
WBC: 6.2 10*3/uL (ref 3.4–10.8)

## 2020-08-13 DIAGNOSIS — L819 Disorder of pigmentation, unspecified: Secondary | ICD-10-CM | POA: Diagnosis not present

## 2020-08-13 DIAGNOSIS — L570 Actinic keratosis: Secondary | ICD-10-CM | POA: Diagnosis not present

## 2020-08-13 DIAGNOSIS — Z85828 Personal history of other malignant neoplasm of skin: Secondary | ICD-10-CM | POA: Diagnosis not present

## 2020-08-13 DIAGNOSIS — L905 Scar conditions and fibrosis of skin: Secondary | ICD-10-CM | POA: Diagnosis not present

## 2020-10-06 ENCOUNTER — Other Ambulatory Visit: Payer: Self-pay

## 2020-10-06 ENCOUNTER — Telehealth: Payer: Self-pay

## 2020-10-06 ENCOUNTER — Ambulatory Visit: Payer: Medicare Other | Admitting: Nurse Practitioner

## 2020-10-06 ENCOUNTER — Encounter: Payer: Self-pay | Admitting: Nurse Practitioner

## 2020-10-06 VITALS — BP 119/70 | HR 99 | Temp 97.8°F | Resp 20 | Ht 69.0 in | Wt 166.0 lb

## 2020-10-06 DIAGNOSIS — N410 Acute prostatitis: Secondary | ICD-10-CM

## 2020-10-06 DIAGNOSIS — R39198 Other difficulties with micturition: Secondary | ICD-10-CM

## 2020-10-06 LAB — URINALYSIS, COMPLETE
Bilirubin, UA: NEGATIVE
Leukocytes,UA: NEGATIVE
Nitrite, UA: NEGATIVE
Protein,UA: NEGATIVE
RBC, UA: NEGATIVE
Specific Gravity, UA: >1.03 — ABNORMAL HIGH (ref 1.005–1.030)
Urobilinogen, Ur: 2 mg/dL — ABNORMAL HIGH (ref 0.2–1.0)
pH, UA: 5 (ref 5.0–7.5)

## 2020-10-06 LAB — MICROSCOPIC EXAMINATION
Bacteria, UA: NONE SEEN
Epithelial Cells (non renal): NONE SEEN /hpf (ref 0–10)

## 2020-10-06 MED ORDER — DOXYCYCLINE HYCLATE 100 MG PO TABS: 100.0000 mg | ORAL_TABLET | Freq: Two times a day (BID) | ORAL | 0 refills | Status: DC

## 2020-10-06 MED ORDER — TAMSULOSIN HCL 0.4 MG PO CAPS: 0.4000 mg | ORAL_CAPSULE | Freq: Every day | ORAL | 3 refills | Status: DC

## 2020-10-06 NOTE — Progress Notes (Signed)
   Subjective:    Patient ID: Adam Rowland, male    DOB: [birth year], 75 y.o.   MRN: [id number]  HPI  Chief Complaint: Trouble urinating.  HPI  Symptoms began 2 or 3 weeks ago. Begins the day feeling like he has to urinate but has trouble starting. Pain located in pelvic area increasing pain when his bladder is full. No odor noted when urinating. Urine color is dark, but he does not think its any different in color "than usual." Has not tried anything to improve his symptoms. He does not drink enough water per his spouse.   Review of Systems  Constitutional: Negative for diaphoresis and fever.  Respiratory: Negative for cough, shortness of breath and wheezing.   Cardiovascular: Negative for chest pain and palpitations.  Gastrointestinal: Positive for constipation and rectal pain. Negative for diarrhea and nausea.  Genitourinary: Positive for difficulty urinating, dysuria and frequency. Negative for flank pain, hematuria, penile swelling and scrotal swelling.  Neurological: Negative for dizziness and headaches.  Psychiatric/Behavioral: Positive for confusion.       Objective:   Physical Exam Constitutional:      Appearance: Normal appearance.  Cardiovascular:     Rate and Rhythm: Normal rate and regular rhythm.     Pulses: Normal pulses.     Heart sounds: Normal heart sounds.  Pulmonary:     Effort: Pulmonary effort is normal.     Breath sounds: Normal breath sounds.  Abdominal:     General: Abdomen is flat. Bowel sounds are normal.     Palpations: Abdomen is soft.  Skin:    General: Skin is warm and dry.  Neurological:     Mental Status: He is alert. Mental status is at baseline.  Psychiatric:        Mood and Affect: Mood normal.        Behavior: Behavior normal.        Judgment: Judgment normal.   Urine dipstick shows negative for all components.  Micro exam: negative for WBC's or RBC's.      Assessment & Plan:   Adam Rowland comes in today with chief complaint of  Difficulty urinating.   Diagnosis and orders addressed:  1. Difficulty in urination Encouraged fluids, at least 4 8oz glasses of water a day. Call the office if symptoms do not resolve. - Urinalysis, Complete  2. Acute prostatitis  Increase fluids. Try a warm bath to encourage urination. Take doxycycline with food.   - doxycycline (VIBRA-TABS) 100 MG tablet; Take 1 tablet (100 mg total) by mouth 2 (two) times daily. 1 po bid  Dispense: 28 tablet; Refill: 0 - tamsulosin (FLOMAX) 0.4 MG CAPS capsule; Take 1 capsule (0.4 mg total) by mouth daily.  Dispense: 30 capsule; Refill: 3   Labs pending Health Maintenance reviewed Diet and exercise encouraged  Follow up plan: PRN  Oretha Milch, RN, BSN, FNP-Student  [name] Daphine Deutscher, FNP

## 2020-10-06 NOTE — Patient Instructions (Signed)
Prostatitis  Prostatitis is swelling of the prostate gland, also called the prostate. This gland is about 1.5 inches wide and 1 inch high, and it helps to make a fluid called semen. The prostate is below a man's bladder, in front of the butt (rectum). There are different types of prostatitis. What are the causes? One type of prostatitis is caused by an infection from germs (bacteria). Another type is not caused by germs. It may be caused by:  Things having to do with the nervous system. This system includes thebrain, spinal cord, and nerves.  An autoimmune response. This happens when the body's disease-fighting system attacks healthy tissue in the body by mistake.  Psychological factors. These have to do with how the mind works. The causes of other types of prostatitis are normally not known. What are the signs or symptoms? Symptoms of this condition depend on the type of prostatitis you have. If your condition is caused by germs:  You may feel pain or burning when you pee (urinate).  You may pee often and all of a sudden.  You may have problems starting to pee.  You may have trouble emptying your bladder when you pee.  You may have fever or chills.  You may feel pain in your muscles, joints, low back, or lower belly. If you have other types of prostatitis:  You may pee often or all of a sudden.  You may have trouble starting to pee.  You may have a weak flow when you pee.  You may leak pee after using the bathroom.  You may have other problems, such as: ? Abnormal fluid coming from the penis. ? Pain in the testicles or penis. ? Pain between the butt and the testicles. ? Pain when fluid comes out of the penis during sex. How is this treated? Treatment for this condition depends on the type of prostatitis. Treatment may include:  Medicines. These may treat pain or swelling, or they may help relax muscles.  Exercises to help you move better or get stronger (physical  therapy).  Heat therapy.  Techniques to help you control some of the ways that your body works.  Exercises to help you relax.  Antibiotic medicine, if your condition is caused by germs.  Warm water baths (sitz baths) to relax muscles. Follow these instructions at home: Medicines  Take over-the-counter and prescription medicines only as told by your doctor.  If you were prescribed an antibiotic medicine, take it as told by your doctor. Do not stop using the antibiotic even if you start to feel better. Managing pain and swelling  Take sitz baths as told by your doctor. For a sitz bath, sit in warm water that is deep enough to cover your hips and butt.  If told, put heat on the painful area. Do this as often as told by your doctor. Use the heat source that your doctor recommends, such as a moist heat pack or a heating pad. ? Place a towel between your skin and the heat source. ? Leave the heat on for 20-30 minutes. ? Take off the heat if your skin turns bright red. This is very important if you are unable to feel pain, heat, or cold. You may have a greater risk of getting burned.   General instructions  Do exercises as told by your doctor, if your doctor prescribed them.  Keep all follow-up visits as told by your doctor. This is important. Where to find more information  National Institute   of Diabetes and Digestive and Kidney Diseases: https://www.niddk.nih.gov Contact a doctor if:  Your symptoms get worse.  You have a fever. Get help right away if:  You have chills.  You feel light-headed.  You feel like you may faint.  You cannot pee.  You have blood or clumps of blood (blood clots) in your pee. Summary  Prostatitis is swelling of the prostate gland.  There are different types of prostatitis. Treatment depends on the type that you have.  Take over-the-counter and prescription medicines only as told by your doctor.  Get help right away of you have chills, feel  light-headed, or feel like you may faint. Also get help right away if you cannot pee or you have blood or clumps of blood in your pee. This information is not intended to replace advice given to you by your health care provider. Make sure you discuss any questions you have with your health care provider. Document Revised: 09/20/2019 Document Reviewed: 09/20/2019 Elsevier Patient Education  2021 Elsevier Inc.  

## 2020-12-24 DIAGNOSIS — L570 Actinic keratosis: Secondary | ICD-10-CM | POA: Diagnosis not present

## 2020-12-24 DIAGNOSIS — L905 Scar conditions and fibrosis of skin: Secondary | ICD-10-CM | POA: Diagnosis not present

## 2020-12-24 DIAGNOSIS — L821 Other seborrheic keratosis: Secondary | ICD-10-CM | POA: Diagnosis not present

## 2020-12-24 DIAGNOSIS — L718 Other rosacea: Secondary | ICD-10-CM | POA: Diagnosis not present

## 2020-12-29 NOTE — Patient Instructions (Addendum)
Below is our plan:  We will continue Aricept and Namenda. Continue physical and mental exercise. Consider PT/ST for gait assessment and cognitive therapy. Consider treatment for depression if you feel this is a concern.   Please make sure you are staying well hydrated. I recommend 50-60 ounces daily. Well balanced diet and regular exercise encouraged. Consistent sleep schedule with 6-8 hours recommended.   Please continue follow up with care team as directed.   Follow up with me in 6-12 months   You may receive a survey regarding today's visit. I encourage you to leave honest feed back as I do use this information to improve patient care. Thank you for seeing me today!      Management of Memory Problems   There are some general things you can do to help manage your memory problems.  Your memory may not in fact recover, but by using techniques and strategies you will be able to manage your memory difficulties better.   1)  Establish a routine. ? Try to establish and then stick to a regular routine.  By doing this, you will get used to what to expect and you will reduce the need to rely on your memory.  Also, try to do things at the same time of day, such as taking your medication or checking your calendar first thing in the morning. ? Think about think that you can do as a part of a regular routine and make a list.  Then enter them into a daily planner to remind you.  This will help you establish a routine.   2)  Organize your environment. ? Organize your environment so that it is uncluttered.  Decrease visual stimulation.  Place everyday items such as keys or cell phone in the same place every day (ie.  Basket next to front door) ? Use post it notes with a brief message to yourself (ie. Turn off light, lock the door) ? Use labels to indicate where things go (ie. Which cupboards are for food, dishes, etc.) ? Keep a notepad and pen by the telephone to take messages   3)  Memory Aids ? A  diary or journal/notebook/daily planner ? Making a list (shopping list, chore list, to do list that needs to be done) ? Using an alarm as a reminder (kitchen timer or cell phone alarm) ? Using cell phone to store information (Notes, Calendar, Reminders) ? Calendar/White board placed in a prominent position ? Post-it notes   In order for memory aids to be useful, you need to have good habits.  It's no good remembering to make a note in your journal if you don't remember to look in it.  Try setting aside a certain time of day to look in journal.   4)  Improving mood and managing fatigue. 1. There may be other factors that contribute to memory difficulties.  Factors, such as anxiety, depression and tiredness can affect memory.  Regular gentle exercise can help improve your mood and give you more energy.  Simple relaxation techniques may help relieve symptoms of anxiety  Try to get back to completing activities or hobbies you enjoyed doing in the past.  Learn to pace yourself through activities to decrease fatigue.  Find out about some local support groups where you can share experiences with others.  Try and achieve 7-8 hours of sleep at night.    Bupropion tablets (Depression/Mood Disorders) What is this medicine? BUPROPION (byoo PROE pee on) is used to treat depression.  This medicine may be used for other purposes; ask your health care provider or pharmacist if you have questions. COMMON BRAND NAME(S): Wellbutrin What should I tell my health care provider before I take this medicine? They need to know if you have any of these conditions:  an eating disorder, such as anorexia or bulimia  bipolar disorder or psychosis  diabetes or high blood sugar, treated with medication  glaucoma  heart disease, previous heart attack, or irregular heart beat  head injury or brain tumor  high blood pressure  kidney or liver disease  seizures  suicidal thoughts or a previous suicide  attempt  Tourette's syndrome  weight loss  an unusual or allergic reaction to bupropion, other medicines, foods, dyes, or preservatives  breast-feeding  pregnant or trying to become pregnant How should I use this medicine? Take this medicine by mouth with a glass of water. Follow the directions on the prescription label. You can take it with or without food. If it upsets your stomach, take it with food. Take your medicine at regular intervals. Do not take your medicine more often than directed. Do not stop taking this medicine suddenly except upon the advice of your doctor. Stopping this medicine too quickly may cause serious side effects or your condition may worsen. A special MedGuide will be given to you by the pharmacist with each prescription and refill. Be sure to read this information carefully each time. Talk to your pediatrician regarding the use of this medicine in children. Special care may be needed. Overdosage: If you think you have taken too much of this medicine contact a poison control center or emergency room at once. NOTE: This medicine is only for you. Do not share this medicine with others. What if I miss a dose? If you miss a dose, take it as soon as you can. If it is less than four hours to your next dose, take only that dose and skip the missed dose. Do not take double or extra doses. What may interact with this medicine? Do not take this medicine with any of the following medications:  linezolid  MAOIs like Azilect, Carbex, Eldepryl, Marplan, Nardil, and Parnate  methylene blue (injected into a vein)  other medicines that contain bupropion like Zyban This medicine may also interact with the following medications:  alcohol  certain medicines for anxiety or sleep  certain medicines for blood pressure like metoprolol, propranolol  certain medicines for depression or psychotic disturbances  certain medicines for HIV or AIDS like efavirenz, lopinavir,  nelfinavir, ritonavir  certain medicines for irregular heart beat like propafenone, flecainide  certain medicines for Parkinson's disease like amantadine, levodopa  certain medicines for seizures like carbamazepine, phenytoin, phenobarbital  cimetidine  clopidogrel  cyclophosphamide  digoxin  furazolidone  isoniazid  nicotine  orphenadrine  procarbazine  steroid medicines like prednisone or cortisone  stimulant medicines for attention disorders, weight loss, or to stay awake  tamoxifen  theophylline  thiotepa  ticlopidine  tramadol  warfarin This list may not describe all possible interactions. Give your health care provider a list of all the medicines, herbs, non-prescription drugs, or dietary supplements you use. Also tell them if you smoke, drink alcohol, or use illegal drugs. Some items may interact with your medicine. What should I watch for while using this medicine? Tell your doctor if your symptoms do not get better or if they get worse. Visit your doctor or healthcare provider for regular checks on your progress. Because it may take several  weeks to see the full effects of this medicine, it is important to continue your treatment as prescribed by your doctor. This medicine may cause serious skin reactions. They can happen weeks to months after starting the medicine. Contact your healthcare provider right away if you notice fevers or flu-like symptoms with a rash. The rash may be red or purple and then turn into blisters or peeling of the skin. Or, you might notice a red rash with swelling of the face, lips or lymph nodes in your neck or under your arms. Patients and their families should watch out for new or worsening thoughts of suicide or depression. Also watch out for sudden changes in feelings such as feeling anxious, agitated, panicky, irritable, hostile, aggressive, impulsive, severely restless, overly excited and hyperactive, or not being able to sleep. If  this happens, especially at the beginning of treatment or after a change in dose, call your healthcare provider. Avoid alcoholic drinks while taking this medicine. Drinking excessive alcoholic beverages, using sleeping or anxiety medicines, or quickly stopping the use of these agents while taking this medicine may increase your risk for a seizure. Do not drive or use heavy machinery until you know how this medicine affects you. This medicine can impair your ability to perform these tasks. Do not take this medicine close to bedtime. It may prevent you from sleeping. Your mouth may get dry. Chewing sugarless gum or sucking hard candy, and drinking plenty of water may help. Contact your doctor if the problem does not go away or is severe. What side effects may I notice from receiving this medicine? Side effects that you should report to your doctor or health care professional as soon as possible:  allergic reactions like skin rash, itching or hives, swelling of the face, lips, or tongue  breathing problems  changes in vision  confusion  elevated mood, decreased need for sleep, racing thoughts, impulsive behavior  fast or irregular heartbeat  hallucinations, loss of contact with reality  increased blood pressure  rash, fever, and swollen lymph nodes  redness, blistering, peeling, or loosening of the skin, including inside the mouth  seizures  suicidal thoughts or other mood changes  unusually weak or tired  vomiting Side effects that usually do not require medical attention (report to your doctor or health care professional if they continue or are bothersome):  constipation  headache  loss of appetite  nausea  tremors  weight loss This list may not describe all possible side effects. Call your doctor for medical advice about side effects. You may report side effects to FDA at 1-800-FDA-1088. Where should I keep my medicine? Keep out of the reach of children. Store at  room temperature between 20 and 25 degrees C (68 and 77 degrees F), away from direct sunlight and moisture. Keep tightly closed. Throw away any unused medicine after the expiration date. NOTE: This sheet is a summary. It may not cover all possible information. If you have questions about this medicine, talk to your doctor, pharmacist, or health care provider.  2021 Elsevier/Gold Standard (2018-11-08 14:02:47)    https://point-of-care.elsevierperformancemanager.com/skills">  Dementia Caregiver Guide Dementia is a term used to describe a number of symptoms that affect memory and thinking. The most common symptoms include:  Memory loss.  Trouble with language and communication.  Trouble concentrating.  Poor judgment and problems with reasoning.  Wandering from home or public places.  Extreme anxiety or depression.  Being suspicious or having angry outbursts and accusations.  Child-like behavior  and language. Dementia can be frightening and confusing. And taking care of someone with dementia can be challenging. This guide provides tips to help you when providing care for a person with dementia. How to help manage lifestyle changes Dementia usually gets worse slowly over time. In the early stages, people with dementia can stay independent and safe with some help. In later stages, they need help with daily tasks such as dressing, grooming, and using the bathroom. There are actions you can take to help a person manage his or her life while living with this condition. Communicating  When the person is talking or seems frustrated, make eye contact and hold the person's hand.  Ask specific questions that need yes or no answers.  Use simple words, short sentences, and a calm voice. Only give one direction at a time.  When offering choices, limit the person to just one or two.  Avoid correcting the person in a negative way.  If the person is struggling to find the right words, gently try  to help him or her. Preventing injury  Keep floors clear of clutter. Remove rugs, magazine racks, and floor lamps.  Keep hallways well lit, especially at night.  Put a handrail and nonslip mat in the bathtub or shower.  Put childproof locks on cabinets that contain dangerous items, such as medicines, alcohol, guns, toxic cleaning items, sharp tools or utensils, matches, and lighters.  For doors to the outside of the house, put the locks in places where the person cannot see or reach them easily. This will help ensure that the person does not wander out of the house and get lost.  Be prepared for emergencies. Keep a list of emergency phone numbers and addresses in a convenient area.  Remove car keys and lock garage doors so that the person does not try to get in the car and drive.  Have the person wear a bracelet that tracks locations and identifies the person as having memory problems. This should be worn at all times for safety.   Helping with daily life  Keep the person on track with his or her routine.  Try to identify areas where the person may need help.  Be supportive, patient, calm, and encouraging.  Gently remind the person that adjusting to changes takes time.  Help with the tasks that the person has asked for help with.  Keep the person involved in daily tasks and decisions as much as possible.  Encourage conversation, but try not to get frustrated if the person struggles to find words or does not seem to appreciate your help.   How to recognize stress Look for signs of stress in yourself and in the person you are caring for. If you notice signs of stress, take steps to manage it. Symptoms of stress include:  Feeling anxious, irritable, frustrated, or angry.  Denying that the person has dementia or that his or her symptoms will not improve.  Feeling depressed, hopeless, or unappreciated.  Difficulty sleeping.  Difficulty concentrating.  Developing  stress-related health problems.  Feeling like you have too little time for your own life. Follow these instructions at home: Take care of your health Make sure that you and the person you are caring for:  Get regular sleep.  Exercise regularly.  Eat regular, nutritious meals.  Take over-the-counter and prescription medicines only as told by your health care providers.  Drink enough fluid to keep your urine pale yellow.  Attend all scheduled health care appointments.  General instructions  Join a support group with others who are caregivers.  Ask about respite care resources. Respite care can provide short-term care for the person so that you can have a regular break from the stress of caregiving.  Consider any safety risks and take steps to avoid them.  Organize medicines in a pill box for each day of the week.  Create a plan to handle any legal or financial matters. Get legal or financial advice if needed.  Keep a calendar in a central location to remind the person of appointments or other activities. Where to find support: Many individuals and organizations offer support. These include:  Support groups for people with dementia.  Support groups for caregivers.  Counselors or therapists.  Home health care services.  Adult day care centers. Where to find more information  Centers for Disease Control and Prevention: FootballExhibition.com.br  Alzheimer's Association: LimitLaws.hu  Family Caregiver Alliance: www.caregiver.org  Alzheimer's Foundation of [place]: www.alzfdn.org Contact a health care provider if:  The person's health is rapidly getting worse.  You are no longer able to care for the person.  Caring for the person is affecting your physical and emotional health.  You are feeling depressed or anxious about caring for the person. Get help right away if:  The person threatens himself or herself, you, or anyone else.  You feel depressed or sad, or feel that you  want to harm yourself. If you ever feel like your loved one may hurt himself or herself or others, or if he or she shares thoughts about taking his or her own life, get help right away. You can go to your nearest emergency department or:  Call your local emergency services (911 in the [place]  Call a suicide crisis helpline, such as the National Suicide Prevention Lifeline at 339-074-7494. This is open 24 hours a day in the [place]  Text the Crisis Text Line at 4093128321 (in the [place] Summary  Dementia is a term used to describe a number of symptoms that affect memory and thinking.  Dementia usually gets worse slowly over time.  Take steps to reduce the person's risk of injury and to plan for future care.  Caregivers need support, relief from caregiving, and time for their own lives. This information is not intended to replace advice given to you by your health care provider. Make sure you discuss any questions you have with your health care provider. Document Revised: 12/30/2019 Document Reviewed: 12/30/2019 Elsevier Patient Education  2021 [employer].

## 2020-12-29 NOTE — Progress Notes (Addendum)
PATIENT: Adam Rowland DOB: 12/08/1945  REASON FOR VISIT: follow up HISTORY FROM: patient  Chief Complaint  Patient presents with  . Follow-up    Rm 1 with spouse (joyce) Pt is well and sable.  Has trouble thinking of what to say and keeps eyes closed often per wife      HISTORY OF PRESENT ILLNESS: 12/30/20 Adam Rowland returns today for follow up for dementia. He continues Aricept and Namenda and tolerating well. He presents with his wife who aids in most of history. He is having more difficulty with words. It takes him much longer to get words out. He is walking much slower. Gait is stable. No imbalance or falls. No assistive devices. He is not as motivated. Mood seems stable. He can mow the yard if directed. He continues to go to his son's business everyday but does not help much with operations. Appetite is good. He is sleeping well. He drives to his son's business every morning at 8am and drives home in the afternoon without difficulty. He does not drive, otherwise. Son uses life 360 to monitor location.    12/30/2019 ALL:  Adam Rowland is a 75 y.o. male here today for follow up for memory. He continues Aricept 80m daily and Namenda 144mtwice daily. He is doing well and tolerating medications without obvious adverse effects. 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 He drives locally without difficulty. He performs ADL's independently. No falls. He is able to participate in financial conversations with fiFacilities managerHe does have more difficulty with word finding. This is worse in situations where he is anxious. Overall, he feels that he is doing well.   HISTORY: (copied from my note on 07/01/2019)  Adam Rowland a [age].o. male here today for follow up for mild dementia. He continues Aricept 1047mightly and Namenda 5mg57mice daily without adverse effects noted.  Mr. WallPullinorts that he is doing well.  He is able to perform all ADLs independently.  He continues to drive  but only short, local distances.  He is exercising daily.  He rides a stationary bike for about 30 minutes.  He continues to participate in his son'[employer] in [place] continues close follow-up with primary care for chronic disease management.  No concerns with getting lost, accidents or falls.  HISTORY: (copied from Dr AthaGuadelupe Sabine on 03/07/2019)  Adam Rowland 73 y21r old right-handed gentleman with an underlying medical history of coronary artery disease with history of non-STEMI, history of ischemic cardiomyopathy, hyperlipidemia, hypertension, kidney stones, nonsustained V. tach, and overweight state, who presents for follow-up consultation of his dementia. The patient is accompanied by his wife again today. We had a virtual phone call visit on 12/06/2018, at which time he felt fairly stable. He was able to tolerate donepezil 10 mg daily. He started this in June 2019. We talked about potentially utilizing a second memory medication.   Today, 03/07/19: Please see below for documentation on the virtual visit.   He reports feeling fairly stable, he continues to tolerate the donepezil. He has had no falls, he tries to stay active but his wife has noted that he is not as outgoing and active as he used to be. 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 He seems to be less active in conversation. She has noted that he has more word finding  difficulties or delay in responding. He seems to be just a little bit more sedentary in her perception. He still drives, mostly locally. He still sees his mother who lives about 30 minutes drive away and he manages the drive without problems, [place]. He uses the stationary bike every day for about 30 minutes. She estimates that he drinks about 4 small bottles of water, 8 ounces each.  The patient's allergies, current medications, family history, past  medical history, past social history, past surgical history and problem list were reviewed and updated as appropriate.  Previously:  I saw him on 02/13/2018, at which time his MMSE was 21. We talked about his neuropsychological test results from May 2019. I suggested we start him on Aricept 5 mg strength with titration to 10 mg.  He saw Cecille Rubin, nurse practitioner in the interim on 06/06/2018, at which time his MMSE was 26. He was advised to continue with Aricept.  I first met him on 08/14/2017 at the request of his primary care nurse practitioner, at which time he reported short-term memory loss for the past 3-4 years, maybe 5 years. I suggested further workup in the form of neuropsychological evaluation and blood work as well as brain scan. His blood work was unremarkable with the exception of A1c in the prediabetes range. We checked thyroid function with TSH and B12 level as well. RPR was negative. He had a brain MRI with contrast on 08/18/2017 and I reviewed the results: IMPRESSION: Slightly abnormal MRI scan of the brain showing mild degree of generalized cerebral atrophy. 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  He was called with the results.   He had interim neuropsychological evaluation on 01/01/2018 as well as a feedback appointment on 01/08/18 with Dr. Bonita Quin and I reviewed the results: <<Clinical Impressions:Mild dementia, unspecified, without behavioral disturbance (rule out mixed vascular dementia and Alzheimer's disease).  Results of cognitive testing were abnormal and demonstrated significant impairment in several domains of cognitive function. Additionally, there is evidence that his cognitive deficits are interfering with his ability to perform complex tasks such as managing appointments and finances. As such, diagnostic criteria for a dementia syndrome are met.  The  patient's cognitive profile demonstrates both cortical and subcortical features. Consistent with cortical dysfunction, he demonstrated memory consolidation dysfunction and mildly impaired confrontation naming. Consistent with subcortical dysfunction, he demonstrated slowed processing speed, executive dysfunction and impaired verbal fluency. This may represent a mixed dementia. It is certainly possible that Alzheimer's disease is present, but I also wonder about vascular contribution based on cardiac history and cognitive profile. Surprisingly, brain MRI report did not mention any small vessel disease or remote infarcts.  Overall, based on current level of functioning and test scores, I would characterize his dementia as mild stage. There is no evidence of behavioral disturbance. There also is no evidence of depression, anxiety or other primary psychiatric disorder   REVIEW OF SYSTEMS: Out of a complete 14 system review of symptoms, the patient complains only of the following symptoms, none and all other reviewed systems are negative.  ALLERGIES: No Known Allergies  HOME MEDICATIONS: Outpatient Medications Prior to Visit  Medication Sig Dispense Refill  . aspirin EC 81 MG EC tablet Take 1 tablet (81 mg total) by mouth daily.    Marland Kitchen atorvastatin (LIPITOR) 40 MG tablet TAKE 1 TABLET ONCE DAILY AT BEDTIME 90 tablet 1  .  doxycycline (VIBRA-TABS) 100 MG tablet Take 1 tablet (100 mg total) by mouth 2 (two) times daily. 1 po bid 28 tablet 0  . lisinopril (ZESTRIL) 20 MG tablet Take 1 tablet (20 mg total) by mouth daily. 90 tablet 1  . metoprolol tartrate (LOPRESSOR) 25 MG tablet Take 1 tablet (25 mg total) by mouth 2 (two) times daily. 180 tablet 1  . nitroGLYCERIN (NITROSTAT) 0.4 MG SL tablet Place 1 tablet (0.4 mg total) under the tongue every 5 (five) minutes x 3 doses as needed for chest pain. 25 tablet 3  . tamsulosin (FLOMAX) 0.4 MG CAPS capsule Take 1 capsule (0.4 mg total) by mouth daily. 30 capsule  3  . donepezil (ARICEPT) 10 MG tablet Take 1 tablet (10 mg total) by mouth at bedtime. 90 tablet 3  . memantine (NAMENDA) 10 MG tablet Take 1 tablet (10 mg total) by mouth 2 (two) times daily. 180 tablet 3   No facility-administered medications prior to visit.    PAST MEDICAL HISTORY: Past Medical History:  Diagnosis Date  . CAD (coronary artery disease)    a. 05/2014 NSTEMI/PCI: LM nl, LAD 24m(3.0x23 Xience Alpine DES), D1 90, LCX 841mOM1 80, RCA 30p/d, RPDA 40-50p, EF 40-45% ->Plan for staged PCI of LCX/OM1.  . Cardiomyopathy, ischemic    a. 05/2014 Echo: Ef 45-50%, mid-apicalanteroseptal AK, Gr 2 DD.  . Marland Kitchenataract   . Fracture, ankle 06/12/2014  . Hyperlipidemia   . Hypertension   . [facility]   non STEMI  . Nephrolithiasis 01/16/2013  . NSVT (nonsustained [place] tachycardia) [place]   a. 05/2014 in setting of NSTEMI -  asymptomatic.    PAST SURGICAL HISTORY: Past Surgical History:  Procedure Laterality Date  . ANKLE FRACTURE SURGERY    . CARDIAC CATHETERIZATION    . CARDIAC CATHETERIZATION Right 06/20/2014   Procedure: CORONARY STENT INTERVENTION;  Surgeon: Peter M JoMartiniqueMD;  [facility];  Service: Cardiovascular;  Laterality: Right;  . CATARACT EXTRACTION Bilateral   . CORONARY STENT PLACEMENT    . LEFT HEART CATHETERIZATION WITH CORONARY ANGIOGRAM N/A 06/20/2014   Procedure: LEFT HEART CATHETERIZATION WITH CORONARY ANGIOGRAM;  Surgeon: Peter M JoMartiniqueMD;  [facility];  Service: Cardiovascular;  Laterality: N/A;  . NASAL SEPTUM SURGERY    . VASECTOMY      FAMILY HISTORY: Family History  Problem Relation Age of Onset  . Hypertension Mother   . Cancer Father        bladder ca  . Hyperlipidemia Father   . Alcohol abuse Father   . Hyperlipidemia Sister   . Cancer Brother   . Nephrolithiasis Brother   . Colon cancer Neg Hx   . Stomach cancer Neg Hx   . Esophageal cancer Neg Hx     SOCIAL HISTORY: Social History    Socioeconomic History  . Marital status: Married    Spouse name: JoBlanch Media. Number of children: 1  . Years of education: 1637. Highest education level: Bachelor's degree (e.g., BA, AB, BS)  Occupational History  . Occupation: Retired  Tobacco Use  . Smoking status: Former Smoker    Packs/day: 1.00    Years: 23.00    Pack years: 23.00    Types: Cigarettes    Start date: 10/26/1964    Quit date: 08/30/1987    Years since quitting: 33.3  . Smokeless tobacco: Former [employer] developer  Types: Chew    Quit date: 02/27/1988  .  Tobacco comment: quit in 1989, smoked since he was a teenager/chewed tobacco for about 6 months after he quit smoking cigarettes  Vaping Use  . Vaping Use: Never used  Substance and Sexual Activity  . Alcohol use: Yes    Alcohol/week: 7.0 standard drinks    Types: 7 Glasses of wine per week    Comment: 1 glass of wine nightly   . Drug use: No  . Sexual activity: Not Currently  Other Topics Concern  . 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 He has one son and one granddaughter.    Social Determinants of Health   Financial Resource Strain: Not on file  Food Insecurity: Not on file  Transportation Needs: Not on file  Physical Activity: Not on file  Stress: Not on file  Social Connections: Not on file  Intimate Partner Violence: Not on file      PHYSICAL EXAM  Vitals:   12/30/20 0841  BP: 128/66  Pulse: 61  Weight: 168 lb (76.2 kg)  Height: 5' 9"  (1.753 m)   Body mass index is 24.81 kg/m.  Generalized: Well developed, in no acute distress  Cardiology: normal rate and rhythm, no murmur noted Respiratory: clear to auscultation bilaterally  Neurological examination  Mentation: Alert, he is not oriented to time, place. Able to assist with some history taking. Follows all commands speech and language fluent but slow. Decreased recall and registration.  Cranial nerve II-XII: Pupils were equal round reactive to  light. Extraocular movements were full, visual field were full on confrontational test. Facial sensation and strength were normal. Head turning and shoulder shrug  were normal and symmetric. Motor: The motor testing reveals 5 over 5 strength of all 4 extremities. Good symmetric motor tone is noted throughout.  Sensory: Sensory testing is intact to soft touch on all 4 extremities. No evidence of extinction is noted.  Coordination: Cerebellar testing reveals good finger-nose-finger and heel-to-shin bilaterally.  Gait and station: Gait is normal and stable, decreased arm swing   DIAGNOSTIC DATA (LABS, IMAGING, TESTING) - I reviewed patient records, labs, notes, testing and imaging myself where available.  MMSE - Mini Mental State Exam 12/30/2020 06/10/2020 12/30/2019  Not completed: - Refused -  Orientation to time 0 - 3  Orientation to time comments - - -  Orientation to Place 3 - 4  Registration 3 - 3  Attention/ Calculation 1 - 1  Attention/Calculation-comments - - -  Recall 2 - 1  Language- name 2 objects 2 - 2  Language- repeat 1 - 1  Language- follow 3 step command 2 - 3  Language- read & follow direction 1 - 1  Language-read & follow direction-comments - - -  Write a sentence 0 - 1  Copy design 0 - 1  Copy design-comments - - -  Total score 15 - 21     Lab Results  Component Value Date   WBC 6.2 07/29/2020   HGB 15.9 07/29/2020   HCT 44.6 07/29/2020   MCV 97 07/29/2020   PLT 224 07/29/2020      Component Value Date/Time   NA 144 07/29/2020 0904   K 5.1 07/29/2020 0904   CL 106 07/29/2020 0904   CO2 24 07/29/2020 0904   GLUCOSE 128 (H) 07/29/2020 0904   GLUCOSE 132 (H) 06/21/2014 0259   BUN 14 07/29/2020 0904   CREATININE 1.04 07/29/2020 0904   CREATININE 0.86 01/16/2013 0845   CALCIUM 9.5 07/29/2020  0904   PROT 6.5 07/29/2020 0904   ALBUMIN 4.2 07/29/2020 0904   AST 21 07/29/2020 0904   ALT 25 07/29/2020 0904   ALKPHOS 60 07/29/2020 0904   BILITOT 0.9 07/29/2020  0904   GFRNONAA 71 07/29/2020 0904   GFRNONAA >89 01/16/2013 0845   GFRAA 82 07/29/2020 0904   GFRAA >89 01/16/2013 0845   Lab Results  Component Value Date   CHOL 123 07/29/2020   HDL 43 07/29/2020   LDLCALC 66 07/29/2020   TRIG 65 07/29/2020   CHOLHDL 2.9 07/29/2020   Lab Results  Component Value Date   HGBA1C 5.1 01/28/2020   Lab Results  Component Value Date   VITAMINB12 348 08/14/2017   Lab Results  Component Value Date   TSH 2.320 08/14/2017     ASSESSMENT AND PLAN 75 y.o. year old male  has a past medical history of CAD (coronary artery disease), Cardiomyopathy, ischemic, Cataract, Fracture, ankle (06/12/2014), Hyperlipidemia, Hypertension, Myocardial infarction ([place]) (2015), Nephrolithiasis (01/16/2013), and NSVT (nonsustained ventricular tachycardia) ([place]). here with     ICD-10-CM   1. Dementia without behavioral disturbance, unspecified dementia type (HCC)  F03.90 donepezil (ARICEPT) 10 MG tablet    memantine (NAMENDA) 10 MG tablet     Adam Rowland has experienced some declining symptoms over the past year. He continues to perform ADLs independently but has more difficulty with conversation.  He will continue Aricept and Namenda.  He is tolerating his medications well with no obvious adverse effects. We have discussed option to consider PT/ST for gait training and cognitive therapy. His wife will think about this option. We have also discussed need to monitor closely for depression. It is unclear if he is experiencing depression but does seem less motivated and not engage with conversation. I have encouraged Mrs Proia to monitor this closely at home. May consider treating with SSRI/SNRI. Wellbutrin may help with increasing energy and focus. He was encouraged to stay physically and mentally active.  We have reviewed memory compensation strategies in the office.  He will continue to work on adequate hydration and healthy lifestyle habits.  He will continue close follow-up with  primary care.  I have advised extreme caution with driving. He should not drive at night or in unfamiliar areas. He will follow-up with me in 6-12 months, sooner if needed.  He and his wife both verbalized understanding and agreement with this plan.   No orders of the defined types were placed in this encounter.    Meds ordered this encounter  Medications  . donepezil (ARICEPT) 10 MG tablet    Sig: Take 1 tablet (10 mg total) by mouth at bedtime.    Dispense:  90 tablet    Refill:  3    Order Specific Question:   Supervising Provider    Answer:   Melvenia Beam [id number]  . memantine (NAMENDA) 10 MG tablet    Sig: Take 1 tablet (10 mg total) by mouth 2 (two) times daily.    Dispense:  180 tablet    Refill:  3    Order Specific Question:   Supervising Provider    Answer:   Melvenia Beam [id number]      I spent 30 minutes with the patient. 50% of this time was spent counseling and educating patient on plan of care and medications.    Debbora Presto, FNP-C 12/30/2020, 10:30 AM [facility] [address], [place], [place] [address] [phone]   I reviewed the above  note and documentation by the Nurse Practitioner and agree with the history, exam, assessment and plan as outlined above. I was available for consultation. Star Age, MD, PhD [facility])

## 2020-12-30 ENCOUNTER — Encounter: Payer: Self-pay | Admitting: Family Medicine

## 2020-12-30 ENCOUNTER — Ambulatory Visit: Payer: Medicare Other | Admitting: Family Medicine

## 2020-12-30 DIAGNOSIS — F0390 Unspecified dementia without behavioral disturbance: Secondary | ICD-10-CM | POA: Diagnosis not present

## 2020-12-30 MED ORDER — DONEPEZIL HCL 10 MG PO TABS: 10.0000 mg | ORAL_TABLET | Freq: Every day | ORAL | 3 refills | Status: DC

## 2020-12-30 MED ORDER — MEMANTINE HCL 10 MG PO TABS: 10.0000 mg | ORAL_TABLET | Freq: Two times a day (BID) | ORAL | 3 refills | Status: DC

## 2021-01-28 ENCOUNTER — Other Ambulatory Visit: Payer: Self-pay

## 2021-01-28 ENCOUNTER — Encounter: Payer: Self-pay | Admitting: Nurse Practitioner

## 2021-01-28 ENCOUNTER — Ambulatory Visit (INDEPENDENT_AMBULATORY_CARE_PROVIDER_SITE_OTHER): Payer: Medicare Other | Admitting: Nurse Practitioner

## 2021-01-28 VITALS — BP 145/73 | HR 58 | Temp 97.7°F | Resp 20 | Ht 69.0 in | Wt 165.0 lb

## 2021-01-28 DIAGNOSIS — I10 Essential (primary) hypertension: Secondary | ICD-10-CM

## 2021-01-28 DIAGNOSIS — Z6827 Body mass index (BMI) 27.0-27.9, adult: Secondary | ICD-10-CM

## 2021-01-28 DIAGNOSIS — N410 Acute prostatitis: Secondary | ICD-10-CM | POA: Insufficient documentation

## 2021-01-28 DIAGNOSIS — N411 Chronic prostatitis: Secondary | ICD-10-CM

## 2021-01-28 DIAGNOSIS — E8881 Metabolic syndrome: Secondary | ICD-10-CM

## 2021-01-28 DIAGNOSIS — I214 Non-ST elevation (NSTEMI) myocardial infarction: Secondary | ICD-10-CM | POA: Diagnosis not present

## 2021-01-28 DIAGNOSIS — E782 Mixed hyperlipidemia: Secondary | ICD-10-CM | POA: Diagnosis not present

## 2021-01-28 DIAGNOSIS — I25118 Atherosclerotic heart disease of native coronary artery with other forms of angina pectoris: Secondary | ICD-10-CM

## 2021-01-28 DIAGNOSIS — F0390 Unspecified dementia without behavioral disturbance: Secondary | ICD-10-CM

## 2021-01-28 DIAGNOSIS — R300 Dysuria: Secondary | ICD-10-CM | POA: Diagnosis not present

## 2021-01-28 LAB — URINALYSIS, COMPLETE
Bilirubin, UA: NEGATIVE
Glucose, UA: NEGATIVE
Ketones, UA: NEGATIVE
Leukocytes,UA: NEGATIVE
Nitrite, UA: NEGATIVE
Protein,UA: NEGATIVE
RBC, UA: NEGATIVE
Specific Gravity, UA: 1.02 (ref 1.005–1.030)
Urobilinogen, Ur: 1 mg/dL (ref 0.2–1.0)
pH, UA: 5 (ref 5.0–7.5)

## 2021-01-28 LAB — MICROSCOPIC EXAMINATION
Bacteria, UA: NONE SEEN
Epithelial Cells (non renal): NONE SEEN /hpf (ref 0–10)
RBC, Urine: NONE SEEN /hpf (ref 0–2)
WBC, UA: NONE SEEN /hpf (ref 0–5)

## 2021-01-28 MED ORDER — LISINOPRIL 20 MG PO TABS
20.0000 mg | ORAL_TABLET | Freq: Every day | ORAL | 1 refills | Status: DC
Start: 2021-01-28 — End: 2021-08-02

## 2021-01-28 MED ORDER — TAMSULOSIN HCL 0.4 MG PO CAPS: 0.4000 mg | ORAL_CAPSULE | Freq: Every day | ORAL | 3 refills | Status: DC

## 2021-01-28 MED ORDER — ATORVASTATIN CALCIUM 40 MG PO TABS: ORAL_TABLET | ORAL | 1 refills | Status: DC

## 2021-01-28 MED ORDER — METOPROLOL TARTRATE 25 MG PO TABS
25.0000 mg | ORAL_TABLET | Freq: Two times a day (BID) | ORAL | 1 refills | Status: DC
Start: 2021-01-28 — End: 2021-08-02

## 2021-01-28 NOTE — Patient Instructions (Signed)
Prostatitis  Prostatitis is swelling of the prostate gland, also called the prostate. This gland is about 1.5 inches wide and 1 inch high, and it helps to make a fluid called semen. The prostate is below a man's bladder, in front of the butt (rectum). There are different types of prostatitis. What are the causes? One type of prostatitis is caused by an infection from germs (bacteria). Another type is not caused by germs. It may be caused by:  Things having to do with the nervous system. This system includes thebrain, spinal cord, and nerves.  An autoimmune response. This happens when the body's disease-fighting system attacks healthy tissue in the body by mistake.  Psychological factors. These have to do with how the mind works. The causes of other types of prostatitis are normally not known. What are the signs or symptoms? Symptoms of this condition depend on the type of prostatitis you have. If your condition is caused by germs:  You may feel pain or burning when you pee (urinate).  You may pee often and all of a sudden.  You may have problems starting to pee.  You may have trouble emptying your bladder when you pee.  You may have fever or chills.  You may feel pain in your muscles, joints, low back, or lower belly. If you have other types of prostatitis:  You may pee often or all of a sudden.  You may have trouble starting to pee.  You may have a weak flow when you pee.  You may leak pee after using the bathroom.  You may have other problems, such as: ? Abnormal fluid coming from the penis. ? Pain in the testicles or penis. ? Pain between the butt and the testicles. ? Pain when fluid comes out of the penis during sex. How is this treated? Treatment for this condition depends on the type of prostatitis. Treatment may include:  Medicines. These may treat pain or swelling, or they may help relax muscles.  Exercises to help you move better or get stronger (physical  therapy).  Heat therapy.  Techniques to help you control some of the ways that your body works.  Exercises to help you relax.  Antibiotic medicine, if your condition is caused by germs.  Warm water baths (sitz baths) to relax muscles. Follow these instructions at home: Medicines  Take over-the-counter and prescription medicines only as told by your doctor.  If you were prescribed an antibiotic medicine, take it as told by your doctor. Do not stop using the antibiotic even if you start to feel better. Managing pain and swelling  Take sitz baths as told by your doctor. For a sitz bath, sit in warm water that is deep enough to cover your hips and butt.  If told, put heat on the painful area. Do this as often as told by your doctor. Use the heat source that your doctor recommends, such as a moist heat pack or a heating pad. ? Place a towel between your skin and the heat source. ? Leave the heat on for 20-30 minutes. ? Take off the heat if your skin turns bright red. This is very important if you are unable to feel pain, heat, or cold. You may have a greater risk of getting burned.   General instructions  Do exercises as told by your doctor, if your doctor prescribed them.  Keep all follow-up visits as told by your doctor. This is important. Where to find more information  National Institute   of Diabetes and Digestive and Kidney Diseases: https://www.niddk.nih.gov Contact a doctor if:  Your symptoms get worse.  You have a fever. Get help right away if:  You have chills.  You feel light-headed.  You feel like you may faint.  You cannot pee.  You have blood or clumps of blood (blood clots) in your pee. Summary  Prostatitis is swelling of the prostate gland.  There are different types of prostatitis. Treatment depends on the type that you have.  Take over-the-counter and prescription medicines only as told by your doctor.  Get help right away of you have chills, feel  light-headed, or feel like you may faint. Also get help right away if you cannot pee or you have blood or clumps of blood in your pee. This information is not intended to replace advice given to you by your health care provider. Make sure you discuss any questions you have with your health care provider. Document Revised: 09/20/2019 Document Reviewed: 09/20/2019 Elsevier Patient Education  2021 Elsevier Inc.  

## 2021-01-28 NOTE — Progress Notes (Signed)
Subjective:    Patient ID: Adam Rowland, male    DOB: 05/12/1946, 75 y.o.   MRN: [id number]   Chief Complaint: .medical management of chronic issues     HPI:  1. Primary hypertension No c/o chest pain, sob or headache. Does not check blood pressure at home. BP Readings from Last 3 Encounters:  12/30/20 128/66  10/06/20 119/70  07/29/20 139/74     2. Coronary artery disease of native artery of native heart with stable angina pectoris [facility]) Last saw cardiology on 07/29/19 no changes to plan of care.  3. NSTEMI (non-ST elevated myocardial infarction) ([place]) Had echo on 1/13/21which showed normal pumping function.  4. Mixed hyperlipidemia Does not watch diet and does no dedicated exercise. Lab Results  Component Value Date   CHOL 123 07/29/2020   HDL 43 07/29/2020   LDLCALC 66 07/29/2020   TRIG 65 07/29/2020   CHOLHDL 2.9 07/29/2020     5. Metabolic syndrome Does not watch diet.and does not check blood sugars at home. Lab Results  Component Value Date   HGBA1C 5.1 01/28/2020     6. Dementia without behavioral disturbance, unspecified dementia type [facility]) Sees neurology. Was last seen 12/30/20. Reported no major changes except ha increasing trouble getting out what she wanted to say. No change was made to plan of care  7. BMI 27.0-27.9,adult No recent weight changes. Wt Readings from Last 3 Encounters:  12/30/20 168 lb (76.2 kg)  10/06/20 166 lb (75.3 kg)  07/29/20 167 lb (75.8 kg)   BMI Readings from Last 3 Encounters:  12/30/20 24.81 kg/m  10/06/20 24.51 kg/m  07/29/20 24.66 kg/m       Outpatient Encounter Medications as of 01/28/2021  Medication Sig  . aspirin EC 81 MG EC tablet Take 1 tablet (81 mg total) by mouth daily.  Marland Kitchen atorvastatin (LIPITOR) 40 MG tablet TAKE 1 TABLET ONCE DAILY AT BEDTIME  . donepezil (ARICEPT) 10 MG tablet Take 1 tablet (10 mg total) by mouth at bedtime.  Marland Kitchen doxycycline (VIBRA-TABS) 100 MG tablet Take 1 tablet (100 mg total) by  mouth 2 (two) times daily. 1 po bid  . lisinopril (ZESTRIL) 20 MG tablet Take 1 tablet (20 mg total) by mouth daily.  . memantine (NAMENDA) 10 MG tablet Take 1 tablet (10 mg total) by mouth 2 (two) times daily.  . metoprolol tartrate (LOPRESSOR) 25 MG tablet Take 1 tablet (25 mg total) by mouth 2 (two) times daily.  . nitroGLYCERIN (NITROSTAT) 0.4 MG SL tablet Place 1 tablet (0.4 mg total) under the tongue every 5 (five) minutes x 3 doses as needed for chest pain.  . tamsulosin (FLOMAX) 0.4 MG CAPS capsule Take 1 capsule (0.4 mg total) by mouth daily.   No facility-administered encounter medications on file as of 01/28/2021.    Past Surgical History:  Procedure Laterality Date  . ANKLE FRACTURE SURGERY    . CARDIAC CATHETERIZATION    . CARDIAC CATHETERIZATION Right 06/20/2014   Procedure: CORONARY STENT INTERVENTION;  Surgeon: Peter M Martinique, MD;  Location: [facility] CATH LAB;  Service: Cardiovascular;  Laterality: Right;  . CATARACT EXTRACTION Bilateral   . CORONARY STENT PLACEMENT    . LEFT HEART CATHETERIZATION WITH CORONARY ANGIOGRAM N/A 06/20/2014   Procedure: LEFT HEART CATHETERIZATION WITH CORONARY ANGIOGRAM;  Surgeon: Peter M Martinique, MD;  Location: [facility] CATH LAB;  Service: Cardiovascular;  Laterality: N/A;  . NASAL SEPTUM SURGERY    . VASECTOMY      Family History  Problem  Relation Age of Onset  . Hypertension Mother   . Cancer Father        bladder ca  . Hyperlipidemia Father   . Alcohol abuse Father   . Hyperlipidemia Sister   . Cancer Brother   . Nephrolithiasis Brother   . Colon cancer Neg Hx   . Stomach cancer Neg Hx   . Esophageal cancer Neg Hx     New complaints: He thinks that something is going on with his prostate. He is having problems getting his stream to start at times. He is able to force it out. He has dx of acute prostatitis and is currently on flomax.  Social history: Lives with his wife.  Controlled substance contract: n/a    Review of Systems   Constitutional: Negative for diaphoresis.  Eyes: Negative for pain.  Respiratory: Negative for shortness of breath.   Cardiovascular: Negative for chest pain, palpitations and leg swelling.  Gastrointestinal: Negative for abdominal pain.  Endocrine: Negative for polydipsia.  Skin: Negative for rash.  Neurological: Negative for dizziness, weakness and headaches.  Hematological: Does not bruise/bleed easily.  All other systems reviewed and are negative.      Objective:   Physical Exam Vitals and nursing note reviewed.  Constitutional:      Appearance: Normal appearance. He is well-developed.  HENT:     Head: Normocephalic.     Nose: Nose normal.  Eyes:     Pupils: Pupils are equal, round, and reactive to light.  Neck:     Thyroid: No thyroid mass or thyromegaly.     Vascular: No carotid bruit or JVD.     Trachea: Phonation normal.  Cardiovascular:     Rate and Rhythm: Normal rate and regular rhythm.  Pulmonary:     Effort: Pulmonary effort is normal. No respiratory distress.     Breath sounds: Normal breath sounds.  Abdominal:     General: Bowel sounds are normal.     Palpations: Abdomen is soft.     Tenderness: There is no abdominal tenderness.  Musculoskeletal:        General: Normal range of motion.     Cervical back: Normal range of motion and neck supple.  Lymphadenopathy:     Cervical: No cervical adenopathy.  Skin:    General: Skin is warm and dry.  Neurological:     Mental Status: He is alert and oriented to person, place, and time.  Psychiatric:        Behavior: Behavior normal.        Thought Content: Thought content normal.        Judgment: Judgment normal.    BP (!) 145/73   Pulse (!) 58   Temp 97.7 F (36.5 C) (Temporal)   Resp 20   Ht _0  (1.753 m)   Wt 165 lb (74.8 kg)   SpO2 98%   BMI 24.37 kg/m        Assessment & Plan:  Adam Rowland comes in today with chief complaint of Medical Management of Chronic Issues   Diagnosis and orders  addressed:  1. Primary hypertension Low sodium diet - CBC with Differential/Platelet - CMP14+EGFR - lisinopril (ZESTRIL) 20 MG tablet; Take 1 tablet (20 mg total) by mouth daily.  Dispense: 90 tablet; Refill: 1  2. Coronary artery disease of native artery of native heart with stable angina pectoris [facility]) Keep followup with cardiology yearly and prn - tamsulosin (FLOMAX) 0.4 MG CAPS capsule; Take 1 capsule (0.4 mg total)  by mouth daily.  Dispense: 30 capsule; Refill: 3 - metoprolol tartrate (LOPRESSOR) 25 MG tablet; Take 1 tablet (25 mg total) by mouth 2 (two) times daily.  Dispense: 180 tablet; Refill: 1  3. NSTEMI (non-ST elevated myocardial infarction) ([place])  4. Mixed hyperlipidemia Low fat diet - Lipid panel - atorvastatin (LIPITOR) 40 MG tablet; TAKE 1 TABLET ONCE DAILY AT BEDTIME  Dispense: 90 tablet; Refill: 1  5. Metabolic syndrome Watch sweets in diet  6. Dementia without behavioral disturbance, unspecified dementia type ([place]) Keep follow up with neurology  7. BMI 27.0-27.9,adult Discussed diet and exercise for person with BMI >25 Will recheck weight in 3-6 months  8. Chronic prostatitis Will see what PSA is and then do referral to urology - Urinalysis, Complete - PSA, total and free   Labs pending Health Maintenance reviewed Diet and exercise encouraged  Follow up plan: 6 months    [place], FNP

## 2021-01-29 ENCOUNTER — Inpatient Hospital Stay (HOSPITAL_COMMUNITY)
Admission: EM | Admit: 2021-01-29 | Discharge: 2021-02-04 | DRG: 071 | Disposition: A | Discharge status: Skilled Nursing Facility | Payer: Medicare Other | Attending: Student | Admitting: Student

## 2021-01-29 ENCOUNTER — Emergency Department (HOSPITAL_COMMUNITY): Payer: Medicare Other

## 2021-01-29 ENCOUNTER — Other Ambulatory Visit: Payer: Self-pay

## 2021-01-29 DIAGNOSIS — Z9852 Vasectomy status: Secondary | ICD-10-CM | POA: Diagnosis not present

## 2021-01-29 DIAGNOSIS — I4891 Unspecified atrial fibrillation: Secondary | ICD-10-CM | POA: Diagnosis not present

## 2021-01-29 DIAGNOSIS — F0390 Unspecified dementia without behavioral disturbance: Secondary | ICD-10-CM | POA: Diagnosis present

## 2021-01-29 DIAGNOSIS — I25118 Atherosclerotic heart disease of native coronary artery with other forms of angina pectoris: Secondary | ICD-10-CM | POA: Diagnosis not present

## 2021-01-29 DIAGNOSIS — Z20822 Contact with and (suspected) exposure to covid-19: Secondary | ICD-10-CM | POA: Diagnosis not present

## 2021-01-29 DIAGNOSIS — I10 Essential (primary) hypertension: Secondary | ICD-10-CM | POA: Diagnosis not present

## 2021-01-29 DIAGNOSIS — I483 Typical atrial flutter: Secondary | ICD-10-CM | POA: Diagnosis not present

## 2021-01-29 DIAGNOSIS — I6521 Occlusion and stenosis of right carotid artery: Secondary | ICD-10-CM | POA: Diagnosis not present

## 2021-01-29 DIAGNOSIS — E782 Mixed hyperlipidemia: Secondary | ICD-10-CM | POA: Diagnosis not present

## 2021-01-29 DIAGNOSIS — Z87891 Personal history of nicotine dependence: Secondary | ICD-10-CM

## 2021-01-29 DIAGNOSIS — R443 Hallucinations, unspecified: Secondary | ICD-10-CM | POA: Diagnosis not present

## 2021-01-29 DIAGNOSIS — F05 Delirium due to known physiological condition: Secondary | ICD-10-CM | POA: Diagnosis not present

## 2021-01-29 DIAGNOSIS — Z79899 Other long term (current) drug therapy: Secondary | ICD-10-CM

## 2021-01-29 DIAGNOSIS — R471 Dysarthria and anarthria: Secondary | ICD-10-CM | POA: Diagnosis present

## 2021-01-29 DIAGNOSIS — R29703 NIHSS score 3: Secondary | ICD-10-CM | POA: Diagnosis present

## 2021-01-29 DIAGNOSIS — R001 Bradycardia, unspecified: Secondary | ICD-10-CM | POA: Diagnosis not present

## 2021-01-29 DIAGNOSIS — Z83438 Family history of other disorder of lipoprotein metabolism and other lipidemia: Secondary | ICD-10-CM | POA: Diagnosis not present

## 2021-01-29 DIAGNOSIS — Z7982 Long term (current) use of aspirin: Secondary | ICD-10-CM

## 2021-01-29 DIAGNOSIS — R410 Disorientation, unspecified: Secondary | ICD-10-CM

## 2021-01-29 DIAGNOSIS — N401 Enlarged prostate with lower urinary tract symptoms: Secondary | ICD-10-CM | POA: Diagnosis not present

## 2021-01-29 DIAGNOSIS — I4430 Unspecified atrioventricular block: Secondary | ICD-10-CM | POA: Diagnosis present

## 2021-01-29 DIAGNOSIS — Z8249 Family history of ischemic heart disease and other diseases of the circulatory system: Secondary | ICD-10-CM

## 2021-01-29 DIAGNOSIS — I480 Paroxysmal atrial fibrillation: Secondary | ICD-10-CM | POA: Diagnosis present

## 2021-01-29 DIAGNOSIS — G8194 Hemiplegia, unspecified affecting left nondominant side: Secondary | ICD-10-CM | POA: Diagnosis present

## 2021-01-29 DIAGNOSIS — E785 Hyperlipidemia, unspecified: Secondary | ICD-10-CM | POA: Diagnosis not present

## 2021-01-29 DIAGNOSIS — R531 Weakness: Secondary | ICD-10-CM | POA: Diagnosis not present

## 2021-01-29 DIAGNOSIS — I2510 Atherosclerotic heart disease of native coronary artery without angina pectoris: Secondary | ICD-10-CM | POA: Diagnosis not present

## 2021-01-29 DIAGNOSIS — N400 Enlarged prostate without lower urinary tract symptoms: Secondary | ICD-10-CM | POA: Diagnosis present

## 2021-01-29 DIAGNOSIS — I4892 Unspecified atrial flutter: Secondary | ICD-10-CM | POA: Diagnosis not present

## 2021-01-29 DIAGNOSIS — Z743 Need for continuous supervision: Secondary | ICD-10-CM | POA: Diagnosis not present

## 2021-01-29 DIAGNOSIS — R404 Transient alteration of awareness: Secondary | ICD-10-CM | POA: Diagnosis not present

## 2021-01-29 DIAGNOSIS — Z955 Presence of coronary angioplasty implant and graft: Secondary | ICD-10-CM | POA: Diagnosis not present

## 2021-01-29 DIAGNOSIS — R2681 Unsteadiness on feet: Secondary | ICD-10-CM | POA: Diagnosis not present

## 2021-01-29 DIAGNOSIS — R262 Difficulty in walking, not elsewhere classified: Secondary | ICD-10-CM | POA: Diagnosis not present

## 2021-01-29 DIAGNOSIS — R4182 Altered mental status, unspecified: Secondary | ICD-10-CM | POA: Diagnosis not present

## 2021-01-29 DIAGNOSIS — Z23 Encounter for immunization: Secondary | ICD-10-CM

## 2021-01-29 DIAGNOSIS — I959 Hypotension, unspecified: Secondary | ICD-10-CM | POA: Diagnosis not present

## 2021-01-29 DIAGNOSIS — G9341 Metabolic encephalopathy: Secondary | ICD-10-CM | POA: Diagnosis present

## 2021-01-29 DIAGNOSIS — I252 Old myocardial infarction: Secondary | ICD-10-CM

## 2021-01-29 DIAGNOSIS — G319 Degenerative disease of nervous system, unspecified: Secondary | ICD-10-CM | POA: Diagnosis not present

## 2021-01-29 DIAGNOSIS — I6503 Occlusion and stenosis of bilateral vertebral arteries: Secondary | ICD-10-CM | POA: Diagnosis not present

## 2021-01-29 LAB — URINALYSIS, ROUTINE W REFLEX MICROSCOPIC
Bilirubin Urine: NEGATIVE
Glucose, UA: NEGATIVE mg/dL
Hgb urine dipstick: NEGATIVE
Ketones, ur: NEGATIVE mg/dL
Leukocytes,Ua: NEGATIVE
Nitrite: NEGATIVE
Protein, ur: NEGATIVE mg/dL
Specific Gravity, Urine: >1.046 — ABNORMAL HIGH (ref 1.005–1.030)
pH: 7 (ref 5.0–8.0)

## 2021-01-29 LAB — DIFFERENTIAL
Abs Immature Granulocytes: 0.03 10*3/uL (ref 0.00–0.07)
Basophils Absolute: 0 10*3/uL (ref 0.0–0.1)
Basophils Relative: 1 %
Eosinophils Absolute: 0 10*3/uL (ref 0.0–0.5)
Eosinophils Relative: 0 %
Immature Granulocytes: 0 %
Lymphocytes Relative: 7 %
Lymphs Abs: 0.6 10*3/uL — ABNORMAL LOW (ref 0.7–4.0)
Monocytes Absolute: 0.8 10*3/uL (ref 0.1–1.0)
Monocytes Relative: 9 %
Neutro Abs: 7.3 10*3/uL (ref 1.7–7.7)
Neutrophils Relative %: 83 %

## 2021-01-29 LAB — CMP14+EGFR
ALT: 17 IU/L (ref 0–44)
AST: 17 IU/L (ref 0–40)
Albumin/Globulin Ratio: 2.1 (ref 1.2–2.2)
Albumin: 4.4 g/dL (ref 3.7–4.7)
Alkaline Phosphatase: 59 IU/L (ref 44–121)
BUN/Creatinine Ratio: 15 (ref 10–24)
BUN: 15 mg/dL (ref 8–27)
Bilirubin Total: 0.8 mg/dL (ref 0.0–1.2)
CO2: 25 mmol/L (ref 20–29)
Calcium: 9.3 mg/dL (ref 8.6–10.2)
Chloride: 103 mmol/L (ref 96–106)
Creatinine, Ser: 0.97 mg/dL (ref 0.76–1.27)
Globulin, Total: 2.1 g/dL (ref 1.5–4.5)
Glucose: 132 mg/dL — ABNORMAL HIGH (ref 65–99)
Potassium: 5 mmol/L (ref 3.5–5.2)
Sodium: 141 mmol/L (ref 134–144)
Total Protein: 6.5 g/dL (ref 6.0–8.5)
eGFR: 82 mL/min/{1.73_m2} (ref >59)

## 2021-01-29 LAB — CBC WITH DIFFERENTIAL/PLATELET
Basophils Absolute: 0.1 10*3/uL (ref 0.0–0.2)
Basos: 1 %
EOS (ABSOLUTE): 0.1 10*3/uL (ref 0.0–0.4)
Eos: 2 %
Hematocrit: 45 % (ref 37.5–51.0)
Hemoglobin: 15.8 g/dL (ref 13.0–17.7)
Immature Grans (Abs): 0 10*3/uL (ref 0.0–0.1)
Immature Granulocytes: 0 %
Lymphocytes Absolute: 1.4 10*3/uL (ref 0.7–3.1)
Lymphs: 22 %
MCH: 33.8 pg — ABNORMAL HIGH (ref 26.6–33.0)
MCHC: 35.1 g/dL (ref 31.5–35.7)
MCV: 96 fL (ref 79–97)
Monocytes Absolute: 0.4 10*3/uL (ref 0.1–0.9)
Monocytes: 7 %
Neutrophils Absolute: 4.2 10*3/uL (ref 1.4–7.0)
Neutrophils: 68 %
Platelets: 212 10*3/uL (ref 150–450)
RBC: 4.68 x10E6/uL (ref 4.14–5.80)
RDW: 11.8 % (ref 11.6–15.4)
WBC: 6.2 10*3/uL (ref 3.4–10.8)

## 2021-01-29 LAB — CBC
HCT: 44.2 % (ref 39.0–52.0)
HCT: 47.3 % (ref 39.0–52.0)
Hemoglobin: 15 g/dL (ref 13.0–17.0)
Hemoglobin: 15.9 g/dL (ref 13.0–17.0)
MCH: 34 pg (ref 26.0–34.0)
MCH: 34 pg (ref 26.0–34.0)
MCHC: 33.9 g/dL (ref 30.0–36.0)
MCHC: 33.6 g/dL (ref 30.0–36.0)
MCV: 100.2 fL — ABNORMAL HIGH (ref 80.0–100.0)
MCV: 101.1 fL — ABNORMAL HIGH (ref 80.0–100.0)
Platelets: 164 10*3/uL (ref 150–400)
Platelets: 144 10*3/uL — ABNORMAL LOW (ref 150–400)
RBC: 4.41 MIL/uL (ref 4.22–5.81)
RBC: 4.68 MIL/uL (ref 4.22–5.81)
RDW: 12 % (ref 11.5–15.5)
RDW: 12.1 % (ref 11.5–15.5)
WBC: 8.8 10*3/uL (ref 4.0–10.5)
WBC: 6.7 10*3/uL (ref 4.0–10.5)
nRBC: 0 % (ref 0.0–0.2)
nRBC: 0 % (ref 0.0–0.2)

## 2021-01-29 LAB — LIPID PANEL
Chol/HDL Ratio: 2.8 ratio (ref 0.0–5.0)
Cholesterol, Total: 116 mg/dL (ref 100–199)
HDL: 42 mg/dL (ref >39)
LDL Chol Calc (NIH): 58 mg/dL (ref 0–99)
Triglycerides: 77 mg/dL (ref 0–149)
VLDL Cholesterol Cal: 16 mg/dL (ref 5–40)

## 2021-01-29 LAB — PROTIME-INR
INR: 1.1 (ref 0.8–1.2)
Prothrombin Time: 14 seconds (ref 11.4–15.2)

## 2021-01-29 LAB — RESP PANEL BY RT-PCR (FLU A&B, COVID) ARPGX2
Influenza A by PCR: NEGATIVE
Influenza B by PCR: NEGATIVE
SARS Coronavirus 2 by RT PCR: NEGATIVE

## 2021-01-29 LAB — PSA, TOTAL AND FREE
PSA, Free Pct: 42.3 %
PSA, Free: 0.55 ng/mL
Prostate Specific Ag, Serum: 1.3 ng/mL (ref 0.0–4.0)

## 2021-01-29 LAB — COMPREHENSIVE METABOLIC PANEL
ALT: 18 U/L (ref 0–44)
AST: 19 U/L (ref 15–41)
Albumin: 3.9 g/dL (ref 3.5–5.0)
Alkaline Phosphatase: 50 U/L (ref 38–126)
Anion gap: 8 (ref 5–15)
BUN: 19 mg/dL (ref 8–23)
CO2: 25 mmol/L (ref 22–32)
Calcium: 8.6 mg/dL — ABNORMAL LOW (ref 8.9–10.3)
Chloride: 103 mmol/L (ref 98–111)
Creatinine, Ser: 0.84 mg/dL (ref 0.61–1.24)
GFR, Estimated: >60 mL/min (ref >60)
Glucose, Bld: 130 mg/dL — ABNORMAL HIGH (ref 70–99)
Potassium: 4.2 mmol/L (ref 3.5–5.1)
Sodium: 136 mmol/L (ref 135–145)
Total Bilirubin: 1.2 mg/dL (ref 0.3–1.2)
Total Protein: 6.6 g/dL (ref 6.5–8.1)

## 2021-01-29 LAB — ETHANOL: Alcohol, Ethyl (B): <10 mg/dL (ref <10)

## 2021-01-29 LAB — RAPID URINE DRUG SCREEN, HOSP PERFORMED
Amphetamines: NOT DETECTED
Barbiturates: NOT DETECTED
Benzodiazepines: NOT DETECTED
Cocaine: NOT DETECTED
Opiates: NOT DETECTED
Tetrahydrocannabinol: NOT DETECTED

## 2021-01-29 LAB — CBG MONITORING, ED
Glucose-Capillary: 126 mg/dL — ABNORMAL HIGH (ref 70–99)
Glucose-Capillary: 103 mg/dL — ABNORMAL HIGH (ref 70–99)

## 2021-01-29 LAB — AMMONIA: Ammonia: 13 umol/L (ref 9–35)

## 2021-01-29 LAB — VITAMIN B12: Vitamin B-12: 259 pg/mL (ref 180–914)

## 2021-01-29 LAB — CREATININE, SERUM
Creatinine, Ser: 0.79 mg/dL (ref 0.61–1.24)
GFR, Estimated: >60 mL/min (ref >60)

## 2021-01-29 LAB — APTT: aPTT: 25 seconds (ref 24–36)

## 2021-01-29 LAB — TSH: TSH: 1.321 u[IU]/mL (ref 0.350–4.500)

## 2021-01-29 MED ORDER — ACETAMINOPHEN 325 MG PO TABS: 650.0000 mg | ORAL_TABLET | Freq: Four times a day (QID) | ORAL | Status: DC | PRN

## 2021-01-29 MED ORDER — IOHEXOL 350 MG/ML SOLN
100.0000 mL | Freq: Once | INTRAVENOUS | Status: AC | PRN
  Administered 2021-01-29: 100 mL via INTRAVENOUS

## 2021-01-29 MED ORDER — ONDANSETRON HCL 4 MG PO TABS: 4.0000 mg | ORAL_TABLET | Freq: Four times a day (QID) | ORAL | Status: DC | PRN

## 2021-01-29 MED ORDER — ENOXAPARIN SODIUM 40 MG/0.4ML IJ SOSY
40.0000 mg | PREFILLED_SYRINGE | INTRAMUSCULAR | Status: DC
  Administered 2021-01-29 – 2021-02-03 (×6): 40 mg via SUBCUTANEOUS
  Filled 2021-01-29 (×6): qty 0.4

## 2021-01-29 MED ORDER — METOPROLOL TARTRATE 25 MG PO TABS
25.0000 mg | ORAL_TABLET | Freq: Two times a day (BID) | ORAL | Status: DC
  Administered 2021-01-29 – 2021-02-04 (×12): 25 mg via ORAL
  Filled 2021-01-29 (×12): qty 1

## 2021-01-29 MED ORDER — LACTATED RINGERS IV SOLN: INTRAVENOUS | Status: DC

## 2021-01-29 MED ORDER — ACETAMINOPHEN 650 MG RE SUPP: 650.0000 mg | Freq: Four times a day (QID) | RECTAL | Status: DC | PRN

## 2021-01-29 MED ORDER — ASPIRIN 81 MG PO CHEW
81.0000 mg | CHEWABLE_TABLET | Freq: Every day | ORAL | Status: DC
  Administered 2021-01-29 – 2021-02-04 (×7): 81 mg via ORAL
  Filled 2021-01-29 (×8): qty 1

## 2021-01-29 MED ORDER — ONDANSETRON HCL 4 MG/2ML IJ SOLN: 4.0000 mg | Freq: Four times a day (QID) | INTRAMUSCULAR | Status: DC | PRN

## 2021-01-29 MED ORDER — LISINOPRIL 10 MG PO TABS
20.0000 mg | ORAL_TABLET | Freq: Every day | ORAL | Status: DC
  Administered 2021-01-30 – 2021-02-04 (×6): 20 mg via ORAL
  Filled 2021-01-29 (×7): qty 2

## 2021-01-29 MED ORDER — CYANOCOBALAMIN 1000 MCG/ML IJ SOLN
1000.0000 ug | Freq: Once | INTRAMUSCULAR | Status: AC
  Administered 2021-01-29: 1000 ug via INTRAMUSCULAR
  Filled 2021-01-29: qty 1

## 2021-01-29 MED ORDER — MEMANTINE HCL 10 MG PO TABS
10.0000 mg | ORAL_TABLET | Freq: Two times a day (BID) | ORAL | Status: DC
  Administered 2021-01-29 – 2021-01-30 (×2): 10 mg via ORAL
  Filled 2021-01-29 (×2): qty 1

## 2021-01-29 MED ORDER — DONEPEZIL HCL 5 MG PO TABS
10.0000 mg | ORAL_TABLET | Freq: Every day | ORAL | Status: DC
  Administered 2021-01-29 – 2021-02-03 (×6): 10 mg via ORAL
  Filled 2021-01-29 (×6): qty 2

## 2021-01-29 MED ORDER — ATORVASTATIN CALCIUM 40 MG PO TABS
40.0000 mg | ORAL_TABLET | Freq: Every day | ORAL | Status: DC
  Administered 2021-01-29 – 2021-02-03 (×6): 40 mg via ORAL
  Filled 2021-01-29 (×6): qty 1

## 2021-01-29 NOTE — ED Triage Notes (Signed)
Per patient wife patient more confused and weak this am. Seemed weaker on one side. Wife called EMS to have patient evaluated.

## 2021-01-29 NOTE — Progress Notes (Signed)
[id number] Call time 0905 exam started 0907 exam finished 0907 images sent to soc 0908 exam completed in epic 0909 [facility] radiology called

## 2021-01-29 NOTE — Addendum Note (Signed)
Addended by: Cleda Daub on: 01/29/2021 02:40 PM   Modules accepted: Orders

## 2021-01-29 NOTE — ED Notes (Signed)
Patient returned from MRI, wife at bedside

## 2021-01-29 NOTE — ED Provider Notes (Signed)
Emergency Department Provider Note   I have reviewed the triage vital signs and the nursing notes.   HISTORY  Chief Complaint Altered Mental Status   HPI Adam Rowland is a 75 y.o. male with past medical history reviewed below presents to the emergency department by EMS after he awoke this morning with slurred speech and increased confusion.  He does have history of dementia but more confused than normal.  For example, he was mixing up right and left.  EMS arrived to find him very dysarthric and activated a code stroke.  On further discussion with EMS the patient awoke this morning at 6:30 AM with deficits already present.  They are unsure when he last went to bed or if he woke up during the night and was observed to be normal at some point.    EMS clarifies with wife that patient was last normal at 9:30 PM yesterday evening.    Past Medical History:  Diagnosis Date  . CAD (coronary artery disease)    a. 05/2014 NSTEMI/PCI: LM nl, LAD 62m (3.0x23 Xience [employer]pine DES), D1 90, LCX 78m, OM1 80, RCA 30p/d, RPDA 40-50p, EF 40-45% ->Plan for staged PCI of LCX/OM1.  . Cardiomyopathy, ischemic    a. 05/2014 Echo: Ef 45-50%, mid-apicalanteroseptal AK, Gr 2 DD.  Marland Kitchen Cataract   . Fracture, ankle 06/12/2014  . Hyperlipidemia   . Hypertension   . Myocardial infarction [facility]) 2015   non STEMI  . Nephrolithiasis 01/16/2013  . NSVT (nonsustained ventricular tachycardia) ([facility])    a. 05/2014 in setting of NSTEMI -  asymptomatic.    Patient Active Problem List   Diagnosis Date Noted  . Acute prostatitis 01/28/2021  . Dementia ([facility]) 06/06/2018  . Metabolic syndrome 05/27/2015  . BMI 27.0-27.9,adult 05/21/2015  . CAD (coronary artery disease) 06/21/2014  . Cardiomyopathy, ischemic 06/21/2014  . NSTEMI (non-ST elevated myocardial infarction) ([facility]) 06/19/2014  . Hypertension 01/16/2014  . Hyperlipidemia 01/16/2013    Past Surgical History:  Procedure Laterality Date  . ANKLE FRACTURE SURGERY     . CARDIAC CATHETERIZATION    . CARDIAC CATHETERIZATION Right 06/20/2014   Procedure: CORONARY STENT INTERVENTION;  Surgeon: Peter M Swaziland, MD;  [facility] LAB;  Service: Cardiovascular;  Laterality: Right;  . CATARACT EXTRACTION Bilateral   . CORONARY STENT PLACEMENT    . LEFT HEART CATHETERIZATION WITH CORONARY ANGIOGRAM N/A 06/20/2014   Procedure: LEFT HEART CATHETERIZATION WITH CORONARY ANGIOGRAM;  Surgeon: Peter M Swaziland, MD;  Location: [facility] CATH LAB;  Service: Cardiovascular;  Laterality: N/A;  . NASAL SEPTUM SURGERY    . VASECTOMY      Allergies Patient has no known allergies.  Family History  Problem Relation Age of Onset  . Hypertension Mother   . Cancer Father        bladder ca  . Hyperlipidemia Father   . Alcohol abuse Father   . Hyperlipidemia Sister   . Cancer Brother   . Nephrolithiasis Brother   . Colon cancer Neg Hx   . Stomach cancer Neg Hx   . Esophageal cancer Neg Hx     Social History Social History   Tobacco Use  . Smoking status: Former Smoker    Packs/day: 1.00    Years: 23.00    Pack years: 23.00    Types: Cigarettes    Start date: 10/26/1964    Quit date: 08/30/1987    Years since quitting: 33.4  . Smokeless tobacco: Former Neurosurgeon    Types: Sports administrator  Quit date: 02/27/1988  . Tobacco comment: quit in 1989, smoked since he was a teenager/chewed tobacco for about 6 months after he quit smoking cigarettes  Vaping Use  . Vaping Use: Never used  Substance Use Topics  . Alcohol use: Yes    Alcohol/week: 7.0 standard drinks    Types: 7 Glasses of wine per week    Comment: 1 glass of wine nightly   . Drug use: No    Review of Systems  Constitutional: No fever/chills Eyes: No visual changes. ENT: No sore throat. Cardiovascular: Denies chest pain. Respiratory: Denies shortness of breath. Gastrointestinal: No abdominal pain.  No nausea, no vomiting.  No diarrhea.  No constipation. Genitourinary: Negative for dysuria. Musculoskeletal: Negative  for back pain. Skin: Negative for rash. Neurological: Negative for headaches, focal weakness or numbness.  10-point ROS otherwise negative.  ____________________________________________   PHYSICAL EXAM:  VITAL SIGNS: Vitals:   01/29/21 0930 01/29/21 1009  BP: (!) 123/99 (!) 102/57  Pulse: 78 65  Resp: 18 18  Temp:  98.8 F (37.1 C)  SpO2: 99% 99%   Constitutional: Alert but confused. No acute distress.  Eyes: Conjunctivae are normal. Head: Atraumatic. Nose: No congestion/rhinnorhea. Mouth/Throat: Mucous membranes are moist.  Neck: No stridor.   Cardiovascular: Normal rate, regular rhythm. Good peripheral circulation. Grossly normal heart sounds.   Respiratory: Normal respiratory effort.  No retractions. Lungs CTAB. Gastrointestinal: Soft and nontender. No distention.  Musculoskeletal: No lower extremity tenderness nor edema. No gross deformities of extremities. Neurologic:  Normal speech and language. 5/5 strength in the bilateral upper/lower extremities. No numbness. No facial asymmetry.  Skin:  Skin is warm, dry and intact. No rash noted.   ____________________________________________   LABS (all labs ordered are listed, but only abnormal results are displayed)  Labs Reviewed  CBC - Abnormal; Notable for the following components:      Result Value   MCV 100.2 (*)    All other components within normal limits  DIFFERENTIAL - Abnormal; Notable for the following components:   Lymphs Abs 0.6 (*)    All other components within normal limits  COMPREHENSIVE METABOLIC PANEL - Abnormal; Notable for the following components:   Glucose, Bld 130 (*)    Calcium 8.6 (*)    All other components within normal limits  CBG MONITORING, ED - Abnormal; Notable for the following components:   Glucose-Capillary 126 (*)    All other components within normal limits  RESP PANEL BY RT-PCR (FLU A&B, COVID) ARPGX2  ETHANOL  PROTIME-INR  APTT  RAPID URINE DRUG SCREEN, HOSP PERFORMED   URINALYSIS, ROUTINE W REFLEX MICROSCOPIC  VITAMIN B12  TSH  I-STAT CHEM 8, ED   ____________________________________________  EKG   EKG Interpretation  Date/Time:  Friday January 29 2021 08:52:17 EDT Ventricular Rate:  66 PR Interval:  162 QRS Duration: 104 QT Interval:  387 QTC Calculation: 406 R Axis:   81 Text Interpretation: Sinus rhythm Borderline right axis deviation Confirmed by [name] [phone]) on 01/29/2021 9:03:32 AM       ____________________________________________  RADIOLOGY  CT HEAD CODE STROKE WO CONTRAST  Result Date: 01/29/2021 CLINICAL DATA:  Code stroke. 75 year old male with confusion and weakness. EXAM: CT HEAD WITHOUT CONTRAST TECHNIQUE: Contiguous axial images were obtained from the base of the skull through the vertex without intravenous contrast. COMPARISON:  Brain MRI 08/18/2017. FINDINGS: Brain: Cerebral volume is stable since 2018 and within normal limits for age. No midline shift, ventriculomegaly, mass effect, evidence of mass lesion, intracranial  hemorrhage or evidence of cortically based acute infarction. Gray-white matter differentiation is within normal limits throughout the brain. No cortical encephalomalacia identified. Vascular: Calcified atherosclerosis at the skull base. No suspicious intracranial vascular hyperdensity. Skull: No acute osseous abnormality identified. Sinuses/Orbits: Previous paranasal sinus surgery and chronic bilateral sinus disease appears stable since 2018. Tympanic cavities and mastoids are clear. Other: No acute orbit or scalp soft tissue finding. ASPECTS [facility] Stroke Program Early CT Score) Total score (0-10 with 10 being normal): 10 IMPRESSION: 1. Normal for age non contrast CT appearance of the brain. 2. This was discussed by telephone with Dr. Jacqulyn Bath in the ED on 01/29/2021 at 09:14 . 3. Chronic paranasal sinus disease, prior sinus surgery. Electronically Signed   By: Odessa Fleming M.D.   On: 01/29/2021 09:15   CT ANGIO HEAD  NECK W WO CM W PERF (CODE STROKE)  Result Date: 01/29/2021 CLINICAL DATA:  Code stroke follow-up, fusion and weakness EXAM: CT ANGIOGRAPHY HEAD AND NECK CT PERFUSION BRAIN TECHNIQUE: Multidetector CT imaging of the head and neck was performed using the standard protocol during bolus administration of intravenous contrast. Multiplanar CT image reconstructions and MIPs were obtained to evaluate the vascular anatomy. Carotid stenosis measurements (when applicable) are obtained utilizing NASCET criteria, using the distal internal carotid diameter as the denominator. Multiphase CT imaging of the brain was performed following IV bolus contrast injection. Subsequent parametric perfusion maps were calculated using RAPID software. CONTRAST:  OMNIPAQUE IOHEXOL 350 MG/ML SOLN COMPARISON:  None. FINDINGS: CTA NECK FINDINGS Aortic arch: Mild calcified plaque along the arch and patent great vessel origins. Right carotid system: Patent. Trace calcified plaque at the common carotid bifurcation. No stenosis. Left carotid system: Patent.  No stenosis. Vertebral arteries: Patent and codominant. There is calcified plaque at the origins bilaterally. At least moderate stenosis on the right. No stenosis on the left. Skeleton: Degenerative changes of the cervical spine primarily at C5-C6. Other neck: Unremarkable. Upper chest: Included upper lungs are clear. Review of the MIP images confirms the above findings CTA HEAD FINDINGS Anterior circulation: Intracranial internal carotid arteries are patent with calcified plaque causing mild stenosis. Anterior cerebral arteries are patent with anterior communicating artery present. Middle cerebral arteries are patent. Posterior circulation: Intracranial vertebral arteries are patent. Basilar artery is patent. Major cerebellar artery origins are patent. Posterior cerebral arteries are patent. Venous sinuses: As permitted by contrast timing, patent. Review of the MIP images confirms the above  findings CT Brain Perfusion Findings: CBF (<30%) Volume: 66mL Perfusion (Tmax>6.0s) volume: 54mL Mismatch Volume: 77mL Infarction Location: None. IMPRESSION: No large vessel occlusion in the neck. No stenosis at the ICA origins. Calcified plaque at the right vertebral artery origin causing at least moderate stenosis. No proximal intracranial vessel occlusion or significant stenosis. Perfusion imaging demonstrates no evidence of core infarction or penumbra. Electronically Signed   By: Guadlupe Spanish M.D.   On: 01/29/2021 09:46    ____________________________________________   PROCEDURES  Procedure(s) performed:   Procedures  CRITICAL CARE Performed by: Maia Plan Total critical care time: 35 minutes Critical care time was exclusive of separately billable procedures and treating other patients. Critical care was necessary to treat or prevent imminent or life-threatening deterioration. Critical care was time spent personally by me on the following activities: development of treatment plan with patient and/or surrogate as well as nursing, discussions with consultants, evaluation of patient's response to treatment, examination of patient, obtaining history from patient or surrogate, ordering and performing treatments and interventions, ordering and  review of laboratory studies, ordering and review of radiographic studies, pulse oximetry and re-evaluation of patient's condition.  [name], MD Emergency Medicine  ____________________________________________   INITIAL IMPRESSION / ASSESSMENT AND PLAN / ED COURSE  Pertinent labs & imaging results that were available during my care of the patient were reviewed by me and considered in my medical decision making (see chart for details).   Patient presents to the emergency department as a code stroke by EMS.  Patient was found to have some speech deficit and left-sided weakness according to EMS on scene but those deficits have significantly  improved in route.  Last normal clarified to be 9:30 PM last night.  Patient awoke at 6:30 AM today with deficits already in place per wife. VAN negative on my exam. Will ask for STAT neuro consultation but with updated timeline will not activate CODE STROKE.   09:15 AM  Spoke with Radiology regarding CT head. Normal for age. No acute findings.   Spoke with Dr. Jerrell Belfast regarding the patient's presentation.  No tPA.  No large vessel occlusion requiring intervention.  Plan for MRI, chest x-ray, UA.   Discussed patient's case with [facility] to request admission. Patient and family (if present) updated with plan. Care transferred to [facility] service.  I reviewed all nursing notes, vitals, pertinent old records, EKGs, labs, imaging (as available).  ____________________________________________  FINAL CLINICAL IMPRESSION(S) / ED DIAGNOSES  Final diagnoses:  AMS (altered mental status)     MEDICATIONS GIVEN DURING THIS VISIT:  Medications  iohexol (OMNIPAQUE) 350 MG/ML injection 100 mL (100 mLs Intravenous Contrast Given 01/29/21 0916)     Note:  This document was prepared using Dragon voice recognition software and may include unintentional dictation errors.  [name], MD, [facility] Emergency Medicine    Takiya Belmares, Arlyss Repress, MD 02/01/21 [phone]

## 2021-01-29 NOTE — Consult Note (Addendum)
[facility] Telemedicine Consult   Requesting Provider: Dr Jacqulyn Bath Consult Participants: Dr. Marthe Patch, Telespecialist RN-Laura W Bedside RN Cecily Location of the provider: [facility] Location of the patient: [facility] 08  This consult was provided via telemedicine with 2-way video and audio communication. The patient/family was informed that care would be provided in this way and agreed to receive care in this manner.    Chief Complaint: speech difficulty - code stroke  HPI:75/M with CAD, dementia, HTN HLD, presenting with complaints of speech difficulty on waking up at 0630 today.  LKW when he went to bed at 2130 hrs Has had many year history of problems with complex tasks, sometimes word fiinding as well as noted in prior Neurology evals from [facility]. Wife reports that he is able to shower himself and dress himself, drives locally within the 2 to 3 mile distance and has moderate dementia requiring assistance with things pertaining to memory.  Yesterday went to primary care had blood work done.  Has been having trouble with urination due to his prostate-was started on Flomax which he took 1 dose last night and another dose this morning. On my examination, he had a nonfocal exam as documented below. Stat head CT negative for acute process, stat CT angiogram negative for LVO, stat CT perfusion negative for perfusion deficit.  The wife reports that he woke up this morning disoriented, would not know where to go for shower which he usually does first thing in the morning and when he came back from the shower after assistance, he put onto pair of shorts which is unlike himself.  He also had trouble moving and getting down the stairs and appeared totally out of sorts for his normal baseline.    Past Medical History:  Diagnosis Date  . CAD (coronary artery disease)    a. 05/2014 NSTEMI/PCI: LM nl, LAD 68m (3.0x23 Xience Alpine DES), D1 90, LCX 20m, OM1 80, RCA 30p/d, RPDA 40-50p, EF 40-45% ->Plan for  staged PCI of LCX/OM1.  . Cardiomyopathy, ischemic    a. 05/2014 Echo: Ef 45-50%, mid-apicalanteroseptal AK, Gr 2 DD.  Marland Kitchen Cataract   . Fracture, ankle 06/12/2014  . Hyperlipidemia   . Hypertension   . Myocardial infarction [facility]) 2015   non STEMI  . Nephrolithiasis 01/16/2013  . NSVT (nonsustained ventricular tachycardia) (HCC)    a. 05/2014 in setting of NSTEMI -  asymptomatic.    No current facility-administered medications for this encounter.  Current Outpatient Medications:  .  aspirin EC 81 MG EC tablet, Take 1 tablet (81 mg total) by mouth daily., Disp: , Rfl:  .  atorvastatin (LIPITOR) 40 MG tablet, TAKE 1 TABLET ONCE DAILY AT BEDTIME, Disp: 90 tablet, Rfl: 1 .  donepezil (ARICEPT) 10 MG tablet, Take 1 tablet (10 mg total) by mouth at bedtime., Disp: 90 tablet, Rfl: 3 .  lisinopril (ZESTRIL) 20 MG tablet, Take 1 tablet (20 mg total) by mouth daily., Disp: 90 tablet, Rfl: 1 .  memantine (NAMENDA) 10 MG tablet, Take 1 tablet (10 mg total) by mouth 2 (two) times daily., Disp: 180 tablet, Rfl: 3 .  metoprolol tartrate (LOPRESSOR) 25 MG tablet, Take 1 tablet (25 mg total) by mouth 2 (two) times daily., Disp: 180 tablet, Rfl: 1 .  nitroGLYCERIN (NITROSTAT) 0.4 MG SL tablet, Place 1 tablet (0.4 mg total) under the tongue every 5 (five) minutes x 3 doses as needed for chest pain., Disp: 25 tablet, Rfl: 3 .  tamsulosin (FLOMAX) 0.4 MG CAPS capsule,  Take 1 capsule (0.4 mg total) by mouth daily., Disp: 30 capsule, Rfl: 3    LKW: 9:30 PM on 01/28/2021 tpa given?: No, outside the window IR Thrombectomy? No, no LVO Modified Rankin Scale: 2-Slight disability-UNABLE to perform all activities but does not need assistance mainly due to dementia Time of teleneurologist evaluation: 8:54 AM  Exam: Vitals:   01/29/21 0845  BP: 127/88  Pulse: 71  Resp: 18  Temp: 99.7 F (37.6 C)  SpO2: 100%    General:  Awake alert in no distress HEENT: Normocephalic atraumatic Cardiovascular: Regular  rhythm Neurologic exam Awake alert oriented to self.  Not oriented to his age or the month. Speech is nondysarthric He has difficulty explaining the detailed picture on the NIH stroke card but no difficulty naming objects on the stroke card. Follows commands No drift Sensory intact No ataxia    NIHSS 1A: Level of Consciousness - 0 1B: Ask Month and Age - 2 1C: 'Blink Eyes' & 'Squeeze Hands' - 0 2: Test Horizontal Extraocular Movements - 0 3: Test Visual Fields - 0 4: Test Facial Palsy - 0 5A: Test Left Arm Motor Drift - 0 5B: Test Right Arm Motor Drift - 0 6A: Test Left Leg Motor Drift - 0 6B: Test Right Leg Motor Drift - 0 7: Test Limb Ataxia - 0 8: Test Sensation - 0 9: Test Language/Aphasia- 1 10: Test Dysarthria - 0 11: Test Extinction/Inattention - 0 NIHSS score: 3   Imaging Reviewed: CT head without contrast: Aspects 10, no acute changes.  Labs reviewed in epic and pertinent values follow: CBC    Component Value Date/Time   WBC 6.2 01/28/2021 0923   WBC 9.6 06/21/2014 0259   RBC 4.68 01/28/2021 0923   RBC 3.55 (L) 06/21/2014 0259   HGB 15.8 01/28/2021 0923   HCT 45.0 01/28/2021 0923   PLT 212 01/28/2021 0923   MCV 96 01/28/2021 0923   MCH 33.8 (H) 01/28/2021 0923   MCH 33.2 06/21/2014 0259   MCHC 35.1 01/28/2021 0923   MCHC 35.5 06/21/2014 0259   RDW 11.8 01/28/2021 0923   LYMPHSABS 1.4 01/28/2021 0923   MONOABS 0.7 06/19/2014 1738   EOSABS 0.1 01/28/2021 0923   BASOSABS 0.1 01/28/2021 0923   CMP     Component Value Date/Time   NA 141 01/28/2021 0923   K 5.0 01/28/2021 0923   CL 103 01/28/2021 0923   CO2 25 01/28/2021 0923   GLUCOSE 132 (H) 01/28/2021 0923   GLUCOSE 132 (H) 06/21/2014 0259   BUN 15 01/28/2021 0923   CREATININE 0.97 01/28/2021 0923   CREATININE 0.86 01/16/2013 0845   CALCIUM 9.3 01/28/2021 0923   PROT 6.5 01/28/2021 0923   ALBUMIN 4.4 01/28/2021 0923   AST 17 01/28/2021 0923   ALT 17 01/28/2021 0923   ALKPHOS 59 01/28/2021  0923   BILITOT 0.8 01/28/2021 0923   GFRNONAA 71 07/29/2020 0904   GFRNONAA >89 01/16/2013 0845   GFRAA 82 07/29/2020 0904   GFRAA >89 01/16/2013 0845     Assessment:  75 year old man with above past medical history presenting with speech difficulty which was noted upon waking up this morning.  Acting more confused globally rather than having any focal neurological deficits. My exam also is nonfocal. Last known well outside the window I obtained stat CTA head and neck and CT perfusion study-negative for LVO/penumbra. Given history of dementia, toxic metabolic encephalopathy versus a small cortical stroke are top of my differentials.  New medication yesterday -  FLOMAX.  Usually well-tolerated, Flomax can have somnolence, orthostatic hypotension, and asthenia is common side effects.  Recommendations:  MRI of the brain UA Chest x-ray B12, TSH Orthostatics Blood work-CBC, BMP essentially unremarkable yesterday and today. Can also consider EEG if not back to baseline. Not sure if Flomax can be reduced in dose any further but I would hold the medication if no other clear explanation is found. He did take 1 dose of Flomax last night and another 1 this morning as well but his symptoms started even before he took the medications I am not sure if the medication is completely to be blamed for this.  Discussed with EDP Dr. [name]  This patient is receiving care for possible acute neurological changes. There was 56 minutes of care by this provider at the time of service, including time for direct evaluation via telemedicine, review of medical records, imaging studies and discussion of findings with providers, the patient and/or family.  -- Milon Dikes, MD [facility] Pager: [phone] If 7pm to 7am, please call on call as listed on AMION.    Addendum 1351 hrs MRI of the brain negative for stroke Recommendations as above Discussed with Dr. Jacqulyn Bath. -- Milon Dikes,  MD Neurologist [facility] Pager: [phone]

## 2021-01-29 NOTE — H&P (Addendum)
History and Physical    Adam Rowland URK:[id number] DOB: 1945-11-28 DOA: 01/29/2021  PCP: Bennie Pierini, FNP  Patient coming from: Home  I have personally briefly reviewed patient's old medical records in [facility]  Chief Complaint: Altered mental status  HPI: Adam Rowland is a 75 y.o. male with medical history significant of dementia, coronary artery disease, hypertension, hyperlipidemia, who lives at home with his wife.  Patient was in his usual state of health yesterday.  His wife reports that when he woke up this morning, he was increasingly confused, was unable to carry on a conversation, she noted slurring of his speech.  He was having difficulty ambulating and was holding onto things as he was walking to prevent falling.  He was seen by EMS who felt that he may have had some left-sided weakness.  He has not had any fever, cough, vomiting, diarrhea, dysuria.  He had seen his primary care physician yesterday and had complained of some difficulty passing urine.  She had started him on Flomax and he took 1 dose yesterday and 1 dose today.  He is not taking any other over-the-counter medications.  No other recent adjustments in chronic medications  ED Course: He was evaluated in the emergency room where he was seen by neurology.  CT imaging of brain as well as MRI imaging of the brain were unremarkable.  Urinalysis did not show any signs of infection.  Basic chemistry was also unrevealing.  B12 noted to be low normal.  His wife reports that his mental status has not returned to baseline and he is still more confused and disoriented than his normal baseline.  She is also noted him having bilateral hand tremors through his ER course although he is not having those now.  Review of Systems: Unable to assess due to mental status   Past Medical History:  Diagnosis Date  . CAD (coronary artery disease)    a. 05/2014 NSTEMI/PCI: LM nl, LAD 68m (3.0x23 Xience Alpine DES), D1 90, LCX 38m,  OM1 80, RCA 30p/d, RPDA 40-50p, EF 40-45% ->Plan for staged PCI of LCX/OM1.  . Cardiomyopathy, ischemic    a. 05/2014 Echo: Ef 45-50%, mid-apicalanteroseptal AK, Gr 2 DD.  Marland Kitchen Cataract   . Fracture, ankle 06/12/2014  . Hyperlipidemia   . Hypertension   . Myocardial infarction [facility]) 2015   non STEMI  . Nephrolithiasis 01/16/2013  . NSVT (nonsustained ventricular tachycardia) (HCC)    a. 05/2014 in setting of NSTEMI -  asymptomatic.    Past Surgical History:  Procedure Laterality Date  . ANKLE FRACTURE SURGERY    . CARDIAC CATHETERIZATION    . CARDIAC CATHETERIZATION Right 06/20/2014   Procedure: CORONARY STENT INTERVENTION;  Surgeon: Peter M Swaziland, MD;  Location: [facility] CATH LAB;  Service: Cardiovascular;  Laterality: Right;  . CATARACT EXTRACTION Bilateral   . CORONARY STENT PLACEMENT    . LEFT HEART CATHETERIZATION WITH CORONARY ANGIOGRAM N/A 06/20/2014   Procedure: LEFT HEART CATHETERIZATION WITH CORONARY ANGIOGRAM;  Surgeon: Peter M Swaziland, MD;  Location: [facility] C[facility] LAB;  Service: Cardiovascular;  Laterality: N/A;  . NASAL SEPTUM SURGERY    . VASECTOMY      Social History:  reports that he quit smoking about 33 years ago. His smoking use included cigarettes. He started smoking about 56 years ago. He has a 23.00 pack-year smoking history. He quit smokeless tobacco use about 32 years ago.  His smokeless tobacco use included chew. He reports current alcohol use of about  7.0 standard drinks of alcohol per week. He reports that he does not use drugs.  No Known Allergies  Family History  Problem Relation Age of Onset  . Hypertension Mother   . Cancer Father        bladder ca  . Hyperlipidemia Father   . Alcohol abuse Father   . Hyperlipidemia Sister   . Cancer Brother   . Nephrolithiasis Brother   . Colon cancer Neg Hx   . Stomach cancer Neg Hx   . Esophageal cancer Neg Hx      Prior to Admission medications   Medication Sig Start Date End Date Taking? Authorizing Provider   aspirin EC 81 MG EC tablet Take 1 tablet (81 mg total) by mouth daily. 06/21/14  Yes Creig Hines, NP  atorvastatin (LIPITOR) 40 MG tablet TAKE 1 TABLET ONCE DAILY AT BEDTIME 01/28/21  Yes Martin, [name], FNP  donepezil (ARICEPT) 10 MG tablet Take 1 tablet (10 mg total) by mouth at bedtime. 12/30/20  Yes Lomax, Amy, NP  lisinopril (ZESTRIL) 20 MG tablet Take 1 tablet (20 mg total) by mouth daily. 01/28/21  Yes Daphine Deutscher, [name], FNP  memantine (NAMENDA) 10 MG tablet Take 1 tablet (10 mg total) by mouth 2 (two) times daily. 12/30/20  Yes Lomax, Amy, NP  metoprolol tartrate (LOPRESSOR) 25 MG tablet Take 1 tablet (25 mg total) by mouth 2 (two) times daily. 01/28/21  Yes Daphine Deutscher, [name], FNP  nitroGLYCERIN (NITROSTAT) 0.4 MG SL tablet Place 1 tablet (0.4 mg total) under the tongue every 5 (five) minutes x 3 doses as needed for chest pain. 12/01/16  Yes Daphine Deutscher, [name], FNP  tamsulosin (FLOMAX) 0.4 MG CAPS capsule Take 1 capsule (0.4 mg total) by mouth daily. 01/28/21  Yes Daphine Deutscher, [name], FNP    Physical Exam: Vitals:   01/29/21 1009 01/29/21 1010 01/29/21 1400 01/29/21 1600  BP: (!) 102/57 (!) 102/57 117/72 113/71  Pulse: 65 67 95 (!) 57  Resp: 18 16 19 18   Temp: 98.8 F (37.1 C)     TempSrc: Oral     SpO2: 99% 99% 98% 98%  Height:        Constitutional: NAD, calm, comfortable Eyes: PERRL, lids and conjunctivae normal ENMT: Mucous membranes are moist. Posterior pharynx clear of any exudate or lesions.Normal dentition.  Neck: normal, supple, no masses, no thyromegaly Respiratory: clear to auscultation bilaterally, no wheezing, no crackles. Normal respiratory effort. No accessory muscle use.  Cardiovascular: Regular rate and rhythm, no murmurs / rubs / gallops. No extremity edema. 2+ pedal pulses. No carotid bruits.  Abdomen: no tenderness, no masses palpated. No hepatosplenomegaly. Bowel sounds positive.  Musculoskeletal: no clubbing / cyanosis. No joint  deformity upper and lower extremities. Good ROM, no contractures. Normal muscle tone.  Skin: no rashes, lesions, ulcers. No induration Neurologic: CN 2-12 grossly intact. Sensation intact, DTR normal. Strength 5/5 in all 4.  Psychiatric: Confused, pleasant, does not know why he is in the hospital   Labs on Admission: I have personally reviewed following labs and imaging studies  CBC: Recent Labs  Lab 01/28/21 0923 01/29/21 0859  WBC 6.2 8.8  NEUTROABS 4.2 7.3  HGB 15.8 15.0  HCT 45.0 44.2  MCV 96 100.2*  PLT 212 164   Basic Metabolic Panel: Recent Labs  Lab 01/28/21 0923 01/29/21 0859  NA 141 136  K 5.0 4.2  CL 103 103  CO2 25 25  GLUCOSE 132* 130*  BUN 15 19  CREATININE 0.97 0.84  CALCIUM 9.3 8.6*  GFR: Estimated Creatinine Clearance: 77.2 mL/min (by C-G formula based on SCr of 0.84 mg/dL). Liver Function Tests: Recent Labs  Lab 01/28/21 0923 01/29/21 0859  AST 17 19  ALT 17 18  ALKPHOS 59 50  BILITOT 0.8 1.2  PROT 6.5 6.6  ALBUMIN 4.4 3.9   No results for input(s): LIPASE, AMYLASE in the last 168 hours. No results for input(s): AMMONIA in the last 168 hours. Coagulation Profile: Recent Labs  Lab 01/29/21 0859  INR 1.1   Cardiac Enzymes: No results for input(s): CKTOTAL, CKMB, CKMBINDEX, TROPONINI in the last 168 hours. BNP (last 3 results) No results for input(s): PROBNP in the last 8760 hours. HbA1C: No results for input(s): HGBA1C in the last 72 hours. CBG: Recent Labs  Lab 01/29/21 0843  GLUCAP 126*   Lipid Profile: Recent Labs    01/28/21 0923  CHOL 116  HDL 42  LDLCALC 58  TRIG 77  CHOLHDL 2.8   Thyroid Function Tests: Recent Labs    01/29/21 0859  TSH 1.321   Anemia Panel: Recent Labs    01/29/21 0859  VITAMINB12 259   Urine analysis:    Component Value Date/Time   COLORURINE YELLOW 01/29/2021 0845   APPEARANCEUR CLEAR 01/29/2021 0845   APPEARANCEUR Clear 01/28/2021 0836   LABSPEC >1.046 (H) 01/29/2021 0845    PHURINE 7.0 01/29/2021 0845   GLUCOSEU NEGATIVE 01/29/2021 0845   HGBUR NEGATIVE 01/29/2021 0845   BILIRUBINUR NEGATIVE 01/29/2021 0845   BILIRUBINUR Negative 01/28/2021 0836   KETONESUR NEGATIVE 01/29/2021 0845   PROTEINUR NEGATIVE 01/29/2021 0845   NITRITE NEGATIVE 01/29/2021 0845   LEUKOCYTESUR NEGATIVE 01/29/2021 0845    Radiological Exams on Admission: MR BRAIN WO CONTRAST  Result Date: 01/29/2021 CLINICAL DATA:  Neuro deficit, acute, stroke suspected. Additional history provided: Speech difficulty, confusion, dementia. EXAM: MRI HEAD WITHOUT CONTRAST TECHNIQUE: Multiplanar, multiecho pulse sequences of the brain and surrounding structures were obtained without intravenous contrast. COMPARISON:  Noncontrast head CT and CT angiogram head/neck 01/29/2021. Brain MRI 08/18/2017. FINDINGS: Brain: Mild intermittent motion degradation. Mild cerebral and cerebellar atrophy. No cortical encephalomalacia is identified. No significant white matter disease. There is no acute infarct. No evidence of intracranial mass. No chronic intracranial blood products. No extra-axial fluid collection. No midline shift. Incidentally noted mega cisterna magna. Vascular: Expected proximal arterial flow voids. Skull and upper cervical spine: No focal marrow lesion. Sinuses/Orbits: Visualized orbits show no acute finding. Postsurgical appearance of the paranasal sinuses. Mild mucosal thickening within the bilateral ethmoidectomy cavities and within the left greater than right maxillary sinuses. IMPRESSION: Mildly motion degraded exam. No evidence of acute intracranial abnormality. Mild generalized parenchymal atrophy. Mild paranasal sinus mucosal thickening, as described. Electronically Signed   By: Jackey Loge DO   On: 01/29/2021 11:23   DG Chest Port 1 View  Result Date: 01/29/2021 CLINICAL DATA:  Confusion, weakness EXAM: PORTABLE CHEST 1 VIEW COMPARISON:  None. FINDINGS: The heart size and mediastinal contours are  within normal limits. Both lungs are clear. Stent projects over the left heart. The visualized skeletal structures are unremarkable. IMPRESSION: No active disease. Electronically Signed   By: Malachy Moan M.D.   On: 01/29/2021 10:14   CT HEAD CODE STROKE WO CONTRAST  Result Date: 01/29/2021 CLINICAL DATA:  Code stroke. 75 year old male with confusion and weakness. EXAM: CT HEAD WITHOUT CONTRAST TECHNIQUE: Contiguous axial images were obtained from the base of the skull through the vertex without intravenous contrast. COMPARISON:  Brain MRI 08/18/2017. FINDINGS: Brain: Cerebral volume is  stable since 2018 and within normal limits for age. No midline shift, ventriculomegaly, mass effect, evidence of mass lesion, intracranial hemorrhage or evidence of cortically based acute infarction. Gray-white matter differentiation is within normal limits throughout the brain. No cortical encephalomalacia identified. Vascular: Calcified atherosclerosis at the skull base. No suspicious intracranial vascular hyperdensity. Skull: No acute osseous abnormality identified. Sinuses/Orbits: Previous paranasal sinus surgery and chronic bilateral sinus disease appears stable since 2018. Tympanic cavities and mastoids are clear. Other: No acute orbit or scalp soft tissue finding. ASPECTS [facility] Stroke Program Early CT Score) Total score (0-10 with 10 being normal): 10 IMPRESSION: 1. Normal for age non contrast CT appearance of the brain. 2. This was discussed by telephone with Dr. Jacqulyn Bath in the ED on 01/29/2021 at 09:14 . 3. Chronic paranasal sinus disease, prior sinus surgery. Electronically Signed   By: Odessa Fleming M.D.   On: 01/29/2021 09:15   CT ANGIO HEAD NECK W WO CM W PERF (CODE STROKE)  Result Date: 01/29/2021 CLINICAL DATA:  Code stroke follow-up, fusion and weakness EXAM: CT ANGIOGRAPHY HEAD AND NECK CT PERFUSION BRAIN TECHNIQUE: Multidetector CT imaging of the head and neck was performed using the standard protocol during  bolus administration of intravenous contrast. Multiplanar CT image reconstructions and MIPs were obtained to evaluate the vascular anatomy. Carotid stenosis measurements (when applicable) are obtained utilizing NASCET criteria, using the distal internal carotid diameter as the denominator. Multiphase CT imaging of the brain was performed following IV bolus contrast injection. Subsequent parametric perfusion maps were calculated using RAPID software. CONTRAST:  OMNIPAQUE IOHEXOL 350 MG/ML SOLN COMPARISON:  None. FINDINGS: CTA NECK FINDINGS Aortic arch: Mild calcified plaque along the arch and patent great vessel origins. Right carotid system: Patent. Trace calcified plaque at the common carotid bifurcation. No stenosis. Left carotid system: Patent.  No stenosis. Vertebral arteries: Patent and codominant. There is calcified plaque at the origins bilaterally. At least moderate stenosis on the right. No stenosis on the left. Skeleton: Degenerative changes of the cervical spine primarily at C5-C6. Other neck: Unremarkable. Upper chest: Included upper lungs are clear. Review of the MIP images confirms the above findings CTA HEAD FINDINGS Anterior circulation: Intracranial internal carotid arteries are patent with calcified plaque causing mild stenosis. Anterior cerebral arteries are patent with anterior communicating artery present. Middle cerebral arteries are patent. Posterior circulation: Intracranial vertebral arteries are patent. Basilar artery is patent. Major cerebellar artery origins are patent. Posterior cerebral arteries are patent. Venous sinuses: As permitted by contrast timing, patent. Review of the MIP images confirms the above findings CT Brain Perfusion Findings: CBF (<30%) Volume: 64mL Perfusion (Tmax>6.0s) volume: 40mL Mismatch Volume: 54mL Infarction Location: None. IMPRESSION: No large vessel occlusion in the neck. No stenosis at the ICA origins. Calcified plaque at the right vertebral artery origin  causing at least moderate stenosis. No proximal intracranial vessel occlusion or significant stenosis. Perfusion imaging demonstrates no evidence of core infarction or penumbra. Electronically Signed   By: Guadlupe Spanish M.D.   On: 01/29/2021 09:46    EKG: Independently reviewed. Sinus rhythm without acute changes  Assessment/Plan Active Problems:   Hyperlipidemia   Hypertension   CAD (coronary artery disease)   Dementia (HCC)   AMS (altered mental status)     Acute encephalopathy -Patient's mental status not appear to be back to baseline. -He is still confused and disoriented -Does not appear to have any focal motor deficits -CT and MRI imaging of the brain were unremarkable -Discussed with Dr. Wilford Corner and  felt that he may need EEG -Would likely benefit from inpatient neurology consultation -[facility] by neuro and possible EEG -We will hold patient's Flomax since this is a new medication (although I am not aware of Flomax causing increasing confusion) -Check ammonia -He does not have any evidence of UTI at this point  Hypertension -Continue on metoprolol and lisinopril  Hyperlipidemia -Continue on statin  Dementia -Continue on home dose of Aricept and Namenda  Low normal B12 -We will order replacement dose intramuscularly  Coronary artery disease -No complaints of chest pain at this time -Continue aspirin, beta-blocker and statin  DVT prophylaxis: Lovenox Code Status: Full code Family Communication: Discussed with wife at the bedside Disposition Plan: [facility] neuro work-up Consults called: Neurology Admission status: Observation, telemetry  Erick BlinksJehanzeb Trinita Devlin MD [facility]   If 7PM-7AM, please contact night-coverage [email or web address]   01/29/2021, 4:42 PM

## 2021-01-29 NOTE — ED Notes (Signed)
Patient transported to MRI 

## 2021-01-29 NOTE — Progress Notes (Addendum)
Patient noted to be in Afib, BP 115/60, HR 103,R 19, O2 sat 97% on room air,  patient denies chest pain, SOB, dizziness  no sign of distress noted, EKG shows Afib, Shalhoub MD notified via Amion, will continue to monitor.   @2256 . Pt had 2.13 sec pause on tele, Shalhoub MD notified via amio

## 2021-01-29 NOTE — Progress Notes (Addendum)
Patient arrived on 4E from [facility] ER via care link, assessment completed see flow sheet, placed on tele ccmd notified, patient oriented to room and staff, bed in lowest position call bell within reach will continue to monitor. Lindzen MD (Neurology) notified of patient's arrival to [phone].

## 2021-01-30 ENCOUNTER — Observation Stay (HOSPITAL_COMMUNITY): Payer: Medicare Other

## 2021-01-30 DIAGNOSIS — I480 Paroxysmal atrial fibrillation: Secondary | ICD-10-CM | POA: Diagnosis present

## 2021-01-30 DIAGNOSIS — I4891 Unspecified atrial fibrillation: Secondary | ICD-10-CM | POA: Diagnosis not present

## 2021-01-30 DIAGNOSIS — R4182 Altered mental status, unspecified: Secondary | ICD-10-CM | POA: Diagnosis not present

## 2021-01-30 LAB — COMPREHENSIVE METABOLIC PANEL
ALT: 19 U/L (ref 0–44)
AST: 19 U/L (ref 15–41)
Albumin: 3.1 g/dL — ABNORMAL LOW (ref 3.5–5.0)
Alkaline Phosphatase: 43 U/L (ref 38–126)
Anion gap: 8 (ref 5–15)
BUN: 17 mg/dL (ref 8–23)
CO2: 23 mmol/L (ref 22–32)
Calcium: 8.4 mg/dL — ABNORMAL LOW (ref 8.9–10.3)
Chloride: 105 mmol/L (ref 98–111)
Creatinine, Ser: 1.05 mg/dL (ref 0.61–1.24)
GFR, Estimated: >60 mL/min (ref >60)
Glucose, Bld: 150 mg/dL — ABNORMAL HIGH (ref 70–99)
Potassium: 3.8 mmol/L (ref 3.5–5.1)
Sodium: 136 mmol/L (ref 135–145)
Total Bilirubin: 0.9 mg/dL (ref 0.3–1.2)
Total Protein: 5.4 g/dL — ABNORMAL LOW (ref 6.5–8.1)

## 2021-01-30 LAB — CBC
HCT: 41.2 % (ref 39.0–52.0)
Hemoglobin: 14.4 g/dL (ref 13.0–17.0)
MCH: 33.8 pg (ref 26.0–34.0)
MCHC: 35 g/dL (ref 30.0–36.0)
MCV: 96.7 fL (ref 80.0–100.0)
Platelets: 142 10*3/uL — ABNORMAL LOW (ref 150–400)
RBC: 4.26 MIL/uL (ref 4.22–5.81)
RDW: 11.9 % (ref 11.5–15.5)
WBC: 7.1 10*3/uL (ref 4.0–10.5)
nRBC: 0 % (ref 0.0–0.2)

## 2021-01-30 LAB — ECHOCARDIOGRAM COMPLETE
Height: 69 in
S' Lateral: 2.7 cm
Weight: 2589.08 oz

## 2021-01-30 LAB — TROPONIN I (HIGH SENSITIVITY)
Troponin I (High Sensitivity): 10 ng/L (ref <18)
Troponin I (High Sensitivity): 12 ng/L (ref <18)

## 2021-01-30 LAB — MAGNESIUM: Magnesium: 1.9 mg/dL (ref 1.7–2.4)

## 2021-01-30 NOTE — Progress Notes (Signed)
PROGRESS NOTE    Adam Rowland  EUM:[id number] DOB: 09/27/1945 DOA: 01/29/2021 PCP: Bennie Pierini, FNP   Brief Narrative:  Adam Rowland is a 75 y.o. male with medical history significant of dementia, coronary artery disease, hypertension, hyperlipidemia, who lives at home with his wife.  Patient was in his usual state of health the night prior to admission, he woke up in the morning confused unable to carry on a meaningful conversation with slurred speech.  Notable ambulation but no clear explanation of possible deficits but was extremely high fall risk which is unusual for him.  Patient ultimately brought to the ED for further evaluation, admitted for acute CVA work-up and neurology was consulted.  Of note no changes in patient's lifestyle food no recent travels only new medication was Flomax initiated 48 hours prior to admission.  Assessment & Plan:   Active Problems:   Hyperlipidemia   Hypertension   CAD (coronary artery disease)   Dementia (HCC)   AMS (altered mental status)   Acute metabolic encephalopathy, unclear etiology -Neurology following, appreciate insight and recommendations  -Patient's imaging including CT head and MRI are unremarkable  -UDS unremarkable, no history of substance abuse or issues with alcohol per family or patient  -No signs or symptoms of infectious process -Mental status appears to be resolving essentially back to baseline today per wife at bedside -with only supportive care  -EEG currently pending to rule out seizure -Patient's labs remain wholly unremarkable -Given patient's age and history of dementia certainly could have had episode of sundowning but less likely given he was at home with no recent travel or issues sleeping  Provoked afib/flutter -Patient's wife states he has had diagnosis of A. fib in the past but is not currently on anticoagulation for unclear reasoning (potentially low afib burden or bleeding risk?) -At this time he is rate  controlled, this event of A. Fib seems to be provoked secondary to above - continue to follow  Hypertension  -Continue metoprolol and lisinopril  Hyperlipidemia -Continue on statin  Dementia, unspecified subtype -Continue on home dose of Aricept and Namenda  Hx of Coronary artery disease - No complaints of chest pain at this time - Continue aspirin, beta-blocker and statin  DVT prophylaxis: Lovenox Code Status: Full code Family Communication: At bedside  Status is: Inpatient  Dispo: The patient is from: Home              Anticipated d/c is to: Home              Anticipated d/c date is: 24 to 48 hours              Patient currently not medically stable for discharge  Consultants:   Neurology  Procedures:   None  Antimicrobials:  None indicated  Subjective: No acute issues or events overnight, wife at bedside states patient is essentially back to baseline denies nausea vomiting diarrhea constipation headache fevers chills chest pain shortness of breath or palpitations  Objective: Vitals:   01/29/21 2228 01/29/21 2356 01/30/21 0438 01/30/21 0738  BP: 115/60 108/71 107/62 128/72  Pulse: (!) 101 83 89   Resp: 17 20 20 20   Temp: 98.7 F (37.1 C) 98.1 F (36.7 C) 97.9 F (36.6 C) 98.7 F (37.1 C)  TempSrc: Axillary Oral Oral Oral  SpO2: 92% 94% 98%   Weight:      Height:        Intake/Output Summary (Last 24 hours) at 01/30/2021 0750 Last data filed  at 01/30/2021 0607 Gross per 24 hour  Intake 916.72 ml  Output 200 ml  Net 716.72 ml   Filed Weights   01/29/21 2215  Weight: 73.4 kg    Examination:  General exam: Appears calm and comfortable  Respiratory system: Clear to auscultation. Respiratory effort normal. Cardiovascular system: S1 & S2 heard, RRR. No JVD, murmurs, rubs, gallops or clicks. No pedal edema. Gastrointestinal system: Abdomen is nondistended, soft and nontender. No organomegaly or masses felt. Normal bowel sounds heard. Central  nervous system: Alert and oriented. No focal neurological deficits. Extremities: Symmetric 5 x 5 power. Skin: No rashes, lesions or ulcers Psychiatry: Judgement and insight appear normal. Mood & affect appropriate.     Data Reviewed: I have personally reviewed following labs and imaging studies  CBC: Recent Labs  Lab 01/28/21 0923 01/29/21 0859 01/29/21 1705 01/30/21 0027  WBC 6.2 8.8 6.7 7.1  NEUTROABS 4.2 7.3  --   --   HGB 15.8 15.0 15.9 14.4  HCT 45.0 44.2 47.3 41.2  MCV 96 100.2* 101.1* 96.7  PLT 212 164 144* 142*   Basic Metabolic Panel: Recent Labs  Lab 01/28/21 0923 01/29/21 0859 01/29/21 1705 01/30/21 0027  NA 141 136  --  136  K 5.0 4.2  --  3.8  CL 103 103  --  105  CO2 25 25  --  23  GLUCOSE 132* 130*  --  150*  BUN 15 19  --  17  CREATININE 0.97 0.84 0.79 1.05  CALCIUM 9.3 8.6*  --  8.4*  MG  --   --   --  1.9   GFR: Estimated Creatinine Clearance: 61.7 mL/min (by C-G formula based on SCr of 1.05 mg/dL). Liver Function Tests: Recent Labs  Lab 01/28/21 0923 01/29/21 0859 01/30/21 0027  AST 17 19 19   ALT 17 18 19   ALKPHOS 59 50 43  BILITOT 0.8 1.2 0.9  PROT 6.5 6.6 5.4*  ALBUMIN 4.4 3.9 3.1*   No results for input(s): LIPASE, AMYLASE in the last 168 hours. Recent Labs  Lab 01/29/21 1705  AMMONIA 13   Coagulation Profile: Recent Labs  Lab 01/29/21 0859  INR 1.1   Cardiac Enzymes: No results for input(s): CKTOTAL, CKMB, CKMBINDEX, TROPONINI in the last 168 hours. BNP (last 3 results) No results for input(s): PROBNP in the last 8760 hours. HbA1C: No results for input(s): HGBA1C in the last 72 hours. CBG: Recent Labs  Lab 01/29/21 0843 01/29/21 2105  GLUCAP 126* 103*   Lipid Profile: Recent Labs    01/28/21 0923  CHOL 116  HDL 42  LDLCALC 58  TRIG 77  CHOLHDL 2.8   Thyroid Function Tests: Recent Labs    01/29/21 0859  TSH 1.321   Anemia Panel: Recent Labs    01/29/21 0859  VITAMINB12 259   Sepsis Labs: No  results for input(s): PROCALCITON, LATICACIDVEN in the last 168 hours.  Recent Results (from the past 240 hour(s))  Microscopic Examination     Status: None   Collection Time: 01/28/21  8:36 AM   Urine  Result Value Ref Range Status   WBC, UA None seen 0 - 5 /hpf Final   RBC None seen 0 - 2 /hpf Final   Epithelial Cells (non renal) None seen 0 - 10 /hpf Final   Bacteria, UA None seen None seen/Few Final  Resp Panel by RT-PCR (Flu A&B, Covid) Nasopharyngeal Swab     Status: None   Collection Time: 01/29/21  8:45 AM  Specimen: Nasopharyngeal Swab; Nasopharyngeal(NP) swabs in vial transport medium  Result Value Ref Range Status   SARS Coronavirus 2 by RT PCR NEGATIVE NEGATIVE Final    Comment: (NOTE) SARS-CoV-2 target nucleic acids are NOT DETECTED.  The SARS-CoV-2 RNA is generally detectable in upper respiratory specimens during the acute phase of infection. The lowest concentration of SARS-CoV-2 viral copies this assay can detect is 138 copies/mL. A negative result does not preclude SARS-Cov-2 infection and should not be used as the sole basis for treatment or other patient management decisions. A negative result may occur with  improper specimen collection/handling, submission of specimen other than nasopharyngeal swab, presence of viral mutation(s) within the areas targeted by this assay, and inadequate number of viral copies(<138 copies/mL). A negative result must be combined with clinical observations, patient history, and epidemiological information. The expected result is Negative.  Fact Sheet for Patients:  BloggerCourse.com  Fact Sheet for Healthcare Providers:  SeriousBroker.it  This test is no t yet approved or cleared by the [place] FDA and  has been authorized for detection and/or diagnosis of SARS-CoV-2 by FDA under an Emergency Use Authorization (EUA). This EUA will remain  in effect (meaning this test can  be used) for the duration of the COVID-19 declaration under Section 564(b)(1) of the Act, 21 U.S.C.section 360bbb-3(b)(1), unless the authorization is terminated  or revoked sooner.       Influenza A by PCR NEGATIVE NEGATIVE Final   Influenza B by PCR NEGATIVE NEGATIVE Final    Comment: (NOTE) The Xpert Xpress SARS-CoV-2/FLU/RSV plus assay is intended as an aid in the diagnosis of influenza from Nasopharyngeal swab specimens and should not be used as a sole basis for treatment. Nasal washings and aspirates are unacceptable for Xpert Xpress SARS-CoV-2/FLU/RSV testing.  Fact Sheet for Patients: BloggerCourse.com  Fact Sheet for Healthcare Providers: SeriousBroker.it  This test is not yet approved or cleared by the [place] FDA and has been authorized for detection and/or diagnosis of SARS-CoV-2 by FDA under an Emergency Use Authorization (EUA). This EUA will remain in effect (meaning this test can be used) for the duration of the COVID-19 declaration under Section 564(b)(1) of the Act, 21 U.S.C. section 360bbb-3(b)(1), unless the authorization is terminated or revoked.  Performed at [facility], [address] [place], [place] [address]          Radiology Studies: MR BRAIN WO CONTRAST  Result Date: 01/29/2021 CLINICAL DATA:  Neuro deficit, acute, stroke suspected. Additional history provided: Speech difficulty, confusion, dementia. EXAM: MRI HEAD WITHOUT CONTRAST TECHNIQUE: Multiplanar, multiecho pulse sequences of the brain and surrounding structures were obtained without intravenous contrast. COMPARISON:  Noncontrast head CT and CT angiogram head/neck 01/29/2021. Brain MRI 08/18/2017. FINDINGS: Brain: Mild intermittent motion degradation. Mild cerebral and cerebellar atrophy. No cortical encephalomalacia is identified. No significant white matter disease. There is no acute infarct. No evidence of intracranial mass. No  chronic intracranial blood products. No extra-axial fluid collection. No midline shift. Incidentally noted mega cisterna magna. Vascular: Expected proximal arterial flow voids. Skull and upper cervical spine: No focal marrow lesion. Sinuses/Orbits: Visualized orbits show no acute finding. Postsurgical appearance of the paranasal sinuses. Mild mucosal thickening within the bilateral ethmoidectomy cavities and within the left greater than right maxillary sinuses. IMPRESSION: Mildly motion degraded exam. No evidence of acute intracranial abnormality. Mild generalized parenchymal atrophy. Mild paranasal sinus mucosal thickening, as described. Electronically Signed   By: Jackey Loge DO   On: 01/29/2021 11:23   DG [facility]  1 View  Result Date: 01/29/2021 CLINICAL DATA:  Confusion, weakness EXAM: PORTABLE CHEST 1 VIEW COMPARISON:  None. FINDINGS: The heart size and mediastinal contours are within normal limits. Both lungs are clear. Stent projects over the left heart. The visualized skeletal structures are unremarkable. IMPRESSION: No active disease. Electronically Signed   By: Malachy Moan M.D.   On: 01/29/2021 10:14   CT HEAD CODE STROKE WO CONTRAST  Result Date: 01/29/2021 CLINICAL DATA:  Code stroke. 75 year old male with confusion and weakness. EXAM: CT HEAD WITHOUT CONTRAST TECHNIQUE: Contiguous axial images were obtained from the base of the skull through the vertex without intravenous contrast. COMPARISON:  Brain MRI 08/18/2017. FINDINGS: Brain: Cerebral volume is stable since 2018 and within normal limits for age. No midline shift, ventriculomegaly, mass effect, evidence of mass lesion, intracranial hemorrhage or evidence of cortically based acute infarction. Gray-white matter differentiation is within normal limits throughout the brain. No cortical encephalomalacia identified. Vascular: Calcified atherosclerosis at the skull base. No suspicious intracranial vascular hyperdensity. Skull: No acute  osseous abnormality identified. Sinuses/Orbits: Previous paranasal sinus surgery and chronic bilateral sinus disease appears stable since 2018. Tympanic cavities and mastoids are clear. Other: No acute orbit or scalp soft tissue finding. ASPECTS [facility] Stroke Program Early CT Score) Total score (0-10 with 10 being normal): 10 IMPRESSION: 1. Normal for age non contrast CT appearance of the brain. 2. This was discussed by telephone with Dr. Jacqulyn Bath in the ED on 01/29/2021 at 09:14 . 3. Chronic paranasal sinus disease, prior sinus surgery. Electronically Signed   By: Odessa Fleming M.D.   On: 01/29/2021 09:15   CT ANGIO HEAD NECK W WO CM W PERF (CODE STROKE)  Result Date: 01/29/2021 CLINICAL DATA:  Code stroke follow-up, fusion and weakness EXAM: CT ANGIOGRAPHY HEAD AND NECK CT PERFUSION BRAIN TECHNIQUE: Multidetector CT imaging of the head and neck was performed using the standard protocol during bolus administration of intravenous contrast. Multiplanar CT image reconstructions and MIPs were obtained to evaluate the vascular anatomy. Carotid stenosis measurements (when applicable) are obtained utilizing NASCET criteria, using the distal internal carotid diameter as the denominator. Multiphase CT imaging of the brain was performed following IV bolus contrast injection. Subsequent parametric perfusion maps were calculated using RAPID software. CONTRAST:  OMNIPAQUE IOHEXOL 350 MG/ML SOLN COMPARISON:  None. FINDINGS: CTA NECK FINDINGS Aortic arch: Mild calcified plaque along the arch and patent great vessel origins. Right carotid system: Patent. Trace calcified plaque at the common carotid bifurcation. No stenosis. Left carotid system: Patent.  No stenosis. Vertebral arteries: Patent and codominant. There is calcified plaque at the origins bilaterally. At least moderate stenosis on the right. No stenosis on the left. Skeleton: Degenerative changes of the cervical spine primarily at C5-C6. Other neck: Unremarkable. Upper  chest: Included upper lungs are clear. Review of the MIP images confirms the above findings CTA HEAD FINDINGS Anterior circulation: Intracranial internal carotid arteries are patent with calcified plaque causing mild stenosis. Anterior cerebral arteries are patent with anterior communicating artery present. Middle cerebral arteries are patent. Posterior circulation: Intracranial vertebral arteries are patent. Basilar artery is patent. Major cerebellar artery origins are patent. Posterior cerebral arteries are patent. Venous sinuses: As permitted by contrast timing, patent. Review of the MIP images confirms the above findings CT Brain Perfusion Findings: CBF (<30%) Volume: 30mL Perfusion (Tmax>6.0s) volume: 91mL Mismatch Volume: 40mL Infarction Location: None. IMPRESSION: No large vessel occlusion in the neck. No stenosis at the ICA origins. Calcified plaque at the right vertebral artery  origin causing at least moderate stenosis. No proximal intracranial vessel occlusion or significant stenosis. Perfusion imaging demonstrates no evidence of core infarction or penumbra. Electronically Signed   By: Guadlupe SpanishPraneil  Patel M.D.   On: 01/29/2021 09:46   Scheduled Meds: . aspirin  81 mg Oral Daily  . atorvastatin  40 mg Oral QHS  . donepezil  10 mg Oral QHS  . enoxaparin (LOVENOX) injection  40 mg Subcutaneous Q24H  . lisinopril  20 mg Oral Daily  . memantine  10 mg Oral BID  . metoprolol tartrate  25 mg Oral BID   Continuous Infusions: . lactated ringers 75 mL/hr at 01/30/21 0610     LOS: 0 days   Time spent: 45min  Azucena FallenWilliam C Flemon Kelty, DO [facility]  If 7PM-7AM, please contact night-coverage [email or web address]  01/30/2021, 7:50 AM

## 2021-01-30 NOTE — Progress Notes (Addendum)
HOSPITAL MEDICINE OVERNIGHT EVENT NOTE    Notified by nursing that upon arrival to [facility] medical telemetry unit patient happens to be exhibiting atrial fibrillation/flutter.  Twelve-lead EKG obtained confirming atrial flutter with variable AV block.  Patient is somewhat confused although this is not any different than the way the [facility].  Patient is currently not in respiratory distress, requiring substantial amounts of oxygen ignores the patient complaining of chest pain.  TSH performed earlier today unremarkable.  Additionally obtaining magnesium and troponin.  Echocardiogram ordered for the morning.  No clinical evidence of thromboembolic disease.  CHA2DS2-VASc score is 3 system and patient will eventually need anticoagulation but does not necessarily need bridging with heparin overnight.  Patient is already is on scheduled oral metoprolol which will be continued.  Day provider to consider nonurgent cardiology consultation in the morning versus outpatient follow-up.  Marinda Elk  MD [facility]

## 2021-01-30 NOTE — Progress Notes (Signed)
  Echocardiogram 2D Echocardiogram has been performed.  Adam Rowland 01/30/2021, 11:20 AM

## 2021-01-30 NOTE — Progress Notes (Signed)
   01/30/21 0738  Assess: MEWS Score  Temp 98.7 F (37.1 C)  BP 128/72  ECG Heart Rate (!) 112  Resp 20  Assess: MEWS Score  MEWS Temp 0  MEWS Systolic 0  MEWS Pulse 2  MEWS RR 0  MEWS LOC 0  MEWS Score 2  MEWS Score Color Yellow  Treat  Pain Scale 0-10  Pain Score 0  Take Vital Signs  Increase Vital Sign Frequency  Yellow: Q 2hr X 2 then Q 4hr X 2, if remains yellow, continue Q 4hrs  Escalate  MEWS: Escalate Yellow: discuss with charge nurse/RN and consider discussing with provider and RRT  Notify: Charge Nurse/RN  Name of Charge Nurse/RN Notified Tawanna Cooler  Date Charge Nurse/RN Notified 01/30/21  Time Charge Nurse/RN Notified 0740  Document  Progress note created (see row info) Yes  Informed charge of yellow mews, will give scheduled medications and monitor patient

## 2021-01-30 NOTE — Consult Note (Signed)
NEURO HOSPITALIST CONSULT NOTE   Requesting physician: Dr. Kerry Hough  Reason for Consult: Acute onset of confusion  History obtained from:  Chart  HPI:                                                                                                                                          Adam Rowland is an 75 y.o. male with dementia presenting in transfer from the [facility] ED for further evaluation of acutely worsened confusion. Vascular imaging at [facility] was negative for LVO. While here at [facility], he had an episode of atrial fibrillation on telemetry that was asymptomatic. Twelve-lead EKG obtained afterwards confirmed atrial flutter with variable AV block. TSH was unremarkable. He continues to be confused.   Dr. Bess Harvest teleneurology consultation note from earlier today has been reviewed: "75/M with CAD, dementia, HTN HLD, presenting with complaints of speech difficulty on waking up at 0630 today. LKW when he went to bed at 2130 hrs Has had many year history of problems with complex tasks, sometimes word fiinding as well as noted in prior Neurology evals from [facility]. Wife reports that he is able to shower himself and dress himself, drives locally within the 2 to 3 mile distance and has moderate dementia requiring assistance with things pertaining to memory.  Yesterday went to primary care had blood work done.  Has been having trouble with urination due to his prostate-was started on Flomax which he took 1 dose last night and another dose this morning. On my examination, he had a nonfocal exam as documented below. Stat head CT negative for acute process, stat CT angiogram negative for LVO, stat CT perfusion negative for perfusion deficit. The wife reports that he woke up this morning disoriented, would not know where to go for shower which he usually does first thing in the morning and when he came back from the shower after assistance, he put onto pair of shorts which is unlike himself.  He also  had trouble moving and getting down the stairs and appeared totally out of sorts for his normal baseline."   Past Medical History:  Diagnosis Date  . CAD (coronary artery disease)    a. 05/2014 NSTEMI/PCI: LM nl, LAD 17m (3.0x23 Xience Alpine DES), D1 90, LCX 7m, OM1 80, RCA 30p/d, RPDA 40-50p, EF 40-45% ->Plan for staged PCI of LCX/OM1.  . Cardiomyopathy, ischemic    a. 05/2014 Echo: Ef 45-50%, mid-apicalanteroseptal AK, Gr 2 DD.  Marland Kitchen Cataract   . Fracture, ankle 06/12/2014  . Hyperlipidemia   . Hypertension   . Myocardial infarction [facility]) 2015   non STEMI  . Nephrolithiasis 01/16/2013  . NSVT (nonsustained ventricular tachycardia) (HCC)    a. 05/2014 in setting of NSTEMI -  asymptomatic.    Past Surgical  History:  Procedure Laterality Date  . ANKLE FRACTURE SURGERY    . CARDIAC CATHETERIZATION    . CARDIAC CATHETERIZATION Right 06/20/2014   Procedure: CORONARY STENT INTERVENTION;  Surgeon: Peter M SwazilandJordan, MD;  [facility];  Service: Cardiovascular;  Laterality: Right;  . CATARACT EXTRACTION Bilateral   . CORONARY STENT PLACEMENT    . LEFT HEART CATHETERIZATION WITH CORONARY ANGIOGRAM N/A 06/20/2014   Procedure: LEFT HEART CATHETERIZATION WITH CORONARY ANGIOGRAM;  Surgeon: Peter M SwazilandJordan, MD;  [facility];  Service: Cardiovascular;  Laterality: N/A;  . NASAL SEPTUM SURGERY    . VASECTOMY      Family History  Problem Relation Age of Onset  . Hypertension Mother   . Cancer Father        bladder ca  . Hyperlipidemia Father   . Alcohol abuse Father   . Hyperlipidemia Sister   . Cancer Brother   . Nephrolithiasis Brother   . Colon cancer Neg Hx   . Stomach cancer Neg Hx   . Esophageal cancer Neg Hx               Social History:  reports that he quit smoking about 33 years ago. His smoking use included cigarettes. He started smoking about 56 years ago. He has a 23.00 pack-year smoking history. He quit smokeless tobacco use about 32 years ago.  His  smokeless tobacco use included chew. He reports current alcohol use of about 7.0 standard drinks of alcohol per week. He reports that he does not use drugs.  No Known Allergies  MEDICATIONS:                                                                                                                     Prior to Admission:  Medications Prior to Admission  Medication Sig Dispense Refill Last Dose  . aspirin EC 81 MG EC tablet Take 1 tablet (81 mg total) by mouth daily.   01/29/2021 at 0715  . atorvastatin (LIPITOR) 40 MG tablet TAKE 1 TABLET ONCE DAILY AT BEDTIME 90 tablet 1 01/28/2021 at Unknown time  . donepezil (ARICEPT) 10 MG tablet Take 1 tablet (10 mg total) by mouth at bedtime. 90 tablet 3 01/28/2021 at Unknown time  . lisinopril (ZESTRIL) 20 MG tablet Take 1 tablet (20 mg total) by mouth daily. 90 tablet 1 01/29/2021 at Unknown time  . memantine (NAMENDA) 10 MG tablet Take 1 tablet (10 mg total) by mouth 2 (two) times daily. 180 tablet 3 01/29/2021 at Unknown time  . metoprolol tartrate (LOPRESSOR) 25 MG tablet Take 1 tablet (25 mg total) by mouth 2 (two) times daily. 180 tablet 1 01/29/2021 at 0715  . nitroGLYCERIN (NITROSTAT) 0.4 MG SL tablet Place 1 tablet (0.4 mg total) under the tongue every 5 (five) minutes x 3 doses as needed for chest pain. 25 tablet 3   . tamsulosin (FLOMAX) 0.4 MG CAPS capsule Take 1 capsule (0.4 mg total) by mouth daily. 30 capsule 3 01/29/2021  at Unknown time   Scheduled: . aspirin  81 mg Oral Daily  . atorvastatin  40 mg Oral QHS  . donepezil  10 mg Oral QHS  . enoxaparin (LOVENOX) injection  40 mg Subcutaneous Q24H  . lisinopril  20 mg Oral Daily  . memantine  10 mg Oral BID  . metoprolol tartrate  25 mg Oral BID   Continuous: . lactated ringers 75 mL/hr at 01/30/21 0610     ROS:                                                                                                                                       As per HPI. The patient is unable to provide a  reliable ROS due to confusion.    Blood pressure 108/71, pulse 83, temperature 98.1 F (36.7 C), temperature source Oral, resp. rate 20, height 5\' 9"  (1.753 m), weight 73.4 kg, SpO2 94 %.   General Examination:                                                                                                       Physical Exam  HEENT-  [place]/AT. No nuchal rigidity. Negative Kernig and Brudzinski sign.   Lungs- Respirations unlabored Extremities- No edema.  Neurological Examination Mental Status: Awake with decreased level of alertness. Poor attention. He is cooperative with the exam. No agitation noted. Increased latencies of verbal and motor responses. No hemineglect. Speech is sparse but fluent. Comprehension intact to basic commands. Poorly oriented. Poor insight. No dysarthria. Perseverates several times during the interview, especially when asking orientation questions.  Cranial Nerves: II:  Temporal visual fields intact with no extinction to DSS. PERRL.  III,IV, VI: No ptosis. EOMI without nystagmus.  V,VII: Smile symmetric, facial temp sensation equal bilaterally VIII: hearing intact to voice IX,X: No hypophonia XI: Symmetric XII: Midline tongue extension Motor: Right : Upper extremity   5/5    Left:     Upper extremity   5/5  Lower extremity   5/5     Lower extremity   5/5 No pronator drift.  No cogwheeling.  No asterixis.  Sensory: Temp and light touch subjectively intact in all 4 extremities. No extinction to DSS.  Deep Tendon Reflexes: 2+ and symmetric bilateral brachioradialis, biceps and patellar reflexes.  Cerebellar: No ataxia with FNF, but with slowed movements. No tremor.  Gait: Deferred   Lab Results: Basic Metabolic Panel: Recent Labs  Lab 01/28/21 0923 01/29/21 0859 01/29/21 1705  NA  141 136  --   K 5.0 4.2  --   CL 103 103  --   CO2 25 25  --   GLUCOSE 132* 130*  --   BUN 15 19  --   CREATININE 0.97 0.84 0.79  CALCIUM 9.3 8.6*  --     CBC: Recent  Labs  Lab 01/28/21 0923 01/29/21 0859 01/29/21 1705 01/30/21 0027  WBC 6.2 8.8 6.7 7.1  NEUTROABS 4.2 7.3  --   --   HGB 15.8 15.0 15.9 14.4  HCT 45.0 44.2 47.3 41.2  MCV 96 100.2* 101.1* 96.7  PLT 212 164 144* 142*    Cardiac Enzymes: No results for input(s): CKTOTAL, CKMB, CKMBINDEX, TROPONINI in the last 168 hours.  Lipid Panel: Recent Labs  Lab 01/28/21 0923  CHOL 116  TRIG 77  HDL 42  CHOLHDL 2.8  LDLCALC 58    Imaging: MR BRAIN WO CONTRAST  Result Date: 01/29/2021 CLINICAL DATA:  Neuro deficit, acute, stroke suspected. Additional history provided: Speech difficulty, confusion, dementia. EXAM: MRI HEAD WITHOUT CONTRAST TECHNIQUE: Multiplanar, multiecho pulse sequences of the brain and surrounding structures were obtained without intravenous contrast. COMPARISON:  Noncontrast head CT and CT angiogram head/neck 01/29/2021. Brain MRI 08/18/2017. FINDINGS: Brain: Mild intermittent motion degradation. Mild cerebral and cerebellar atrophy. No cortical encephalomalacia is identified. No significant white matter disease. There is no acute infarct. No evidence of intracranial mass. No chronic intracranial blood products. No extra-axial fluid collection. No midline shift. Incidentally noted mega cisterna magna. Vascular: Expected proximal arterial flow voids. Skull and upper cervical spine: No focal marrow lesion. Sinuses/Orbits: Visualized orbits show no acute finding. Postsurgical appearance of the paranasal sinuses. Mild mucosal thickening within the bilateral ethmoidectomy cavities and within the left greater than right maxillary sinuses. IMPRESSION: Mildly motion degraded exam. No evidence of acute intracranial abnormality. Mild generalized parenchymal atrophy. Mild paranasal sinus mucosal thickening, as described. Electronically Signed   By: Jackey Loge DO   On: 01/29/2021 11:23   DG Chest Port 1 View  Result Date: 01/29/2021 CLINICAL DATA:  Confusion, weakness EXAM: PORTABLE CHEST  1 VIEW COMPARISON:  None. FINDINGS: The heart size and mediastinal contours are within normal limits. Both lungs are clear. Stent projects over the left heart. The visualized skeletal structures are unremarkable. IMPRESSION: No active disease. Electronically Signed   By: Malachy Moan M.D.   On: 01/29/2021 10:14   CT HEAD CODE STROKE WO CONTRAST  Result Date: 01/29/2021 CLINICAL DATA:  Code stroke. 75 year old male with confusion and weakness. EXAM: CT HEAD WITHOUT CONTRAST TECHNIQUE: Contiguous axial images were obtained from the base of the skull through the vertex without intravenous contrast. COMPARISON:  Brain MRI 08/18/2017. FINDINGS: Brain: Cerebral volume is stable since 2018 and within normal limits for age. No midline shift, ventriculomegaly, mass effect, evidence of mass lesion, intracranial hemorrhage or evidence of cortically based acute infarction. Gray-white matter differentiation is within normal limits throughout the brain. No cortical encephalomalacia identified. Vascular: Calcified atherosclerosis at the skull base. No suspicious intracranial vascular hyperdensity. Skull: No acute osseous abnormality identified. Sinuses/Orbits: Previous paranasal sinus surgery and chronic bilateral sinus disease appears stable since 2018. Tympanic cavities and mastoids are clear. Other: No acute orbit or scalp soft tissue finding. ASPECTS [facility] Stroke Program Early CT Score) Total score (0-10 with 10 being normal): 10 IMPRESSION: 1. Normal for age non contrast CT appearance of the brain. 2. This was discussed by telephone with Dr. Jacqulyn Bath in the ED on 01/29/2021 at 09:14 .  3. Chronic paranasal sinus disease, prior sinus surgery. Electronically Signed   By: Odessa Fleming M.D.   On: 01/29/2021 09:15   CT ANGIO HEAD NECK W WO CM W PERF (CODE STROKE)  Result Date: 01/29/2021 CLINICAL DATA:  Code stroke follow-up, fusion and weakness EXAM: CT ANGIOGRAPHY HEAD AND NECK CT PERFUSION BRAIN TECHNIQUE: Multidetector CT  imaging of the head and neck was performed using the standard protocol during bolus administration of intravenous contrast. Multiplanar CT image reconstructions and MIPs were obtained to evaluate the vascular anatomy. Carotid stenosis measurements (when applicable) are obtained utilizing NASCET criteria, using the distal internal carotid diameter as the denominator. Multiphase CT imaging of the brain was performed following IV bolus contrast injection. Subsequent parametric perfusion maps were calculated using RAPID software. CONTRAST:  OMNIPAQUE IOHEXOL 350 MG/ML SOLN COMPARISON:  None. FINDINGS: CTA NECK FINDINGS Aortic arch: Mild calcified plaque along the arch and patent great vessel origins. Right carotid system: Patent. Trace calcified plaque at the common carotid bifurcation. No stenosis. Left carotid system: Patent.  No stenosis. Vertebral arteries: Patent and codominant. There is calcified plaque at the origins bilaterally. At least moderate stenosis on the right. No stenosis on the left. Skeleton: Degenerative changes of the cervical spine primarily at C5-C6. Other neck: Unremarkable. Upper chest: Included upper lungs are clear. Review of the MIP images confirms the above findings CTA HEAD FINDINGS Anterior circulation: Intracranial internal carotid arteries are patent with calcified plaque causing mild stenosis. Anterior cerebral arteries are patent with anterior communicating artery present. Middle cerebral arteries are patent. Posterior circulation: Intracranial vertebral arteries are patent. Basilar artery is patent. Major cerebellar artery origins are patent. Posterior cerebral arteries are patent. Venous sinuses: As permitted by contrast timing, patent. Review of the MIP images confirms the above findings CT Brain Perfusion Findings: CBF (<30%) Volume: 24mL Perfusion (Tmax>6.0s) volume: 20mL Mismatch Volume: 31mL Infarction Location: None. IMPRESSION: No large vessel occlusion in the neck. No  stenosis at the ICA origins. Calcified plaque at the right vertebral artery origin causing at least moderate stenosis. No proximal intracranial vessel occlusion or significant stenosis. Perfusion imaging demonstrates no evidence of core infarction or penumbra. Electronically Signed   By: Guadlupe Spanish M.D.   On: 01/29/2021 09:46    Assessment: 75 year old male who presented to the [facility] ED on Friday morning with speech difficulty which was noted upon waking up Friday morning. Per report, he was acting more confused globally rather than having any focal neurological deficits. He was assessed via Teleneurology. Neurological exam was nonfocal. STAT CTA head and neck with CT perfusion study was negative for LVO/penumbra. MRI brain showed no evidence of acute intracranial abnormality; mild generalized parenchymal atrophy was noted. . - Given history of dementia, toxic metabolic encephalopathy versus a small cortical stroke are top of the differential diagnosis. Unwitnessed seizure with postictal state versus subclinical seizures are also considerations.  - New medication yesterday - FLOMAX.  Usually well-tolerated, Flomax can have somnolence, orthostatic hypotension, and asthenia is common side effects. - CT head: Normal for age non contrast CT appearance of the brain. - CTA of head and neck with CTP: No large vessel occlusion in the neck. No stenosis at the ICA origins. Calcified plaque at the right vertebral artery origin causing at least moderate stenosis. No proximal intracranial vessel occlusion or significant stenosis. Perfusion imaging demonstrates no evidence of core infarction or penumbra. - MRI brain: No evidence of acute intracranial abnormality. Mild generalized parenchymal atrophy. Mild paranasal sinus mucosal thickening. -  Labs which so far are unrevealing:  - Mildly reduced total calcium in the context of low albumin is most likely incidental  - Ammonia and transaminases are normal.   -  BUN/Cr normal.   - Na and Mg normal.   - TSH normal  - No leukocytosis  - U/A unremarkable  - Tox screen negative. EtOH < 10 - Vitamin B12 level of 259 is low by neurological standards - CXR without active disease - No meningismus, fever or white count to suggest a meningitis.   Recommendations:  - Vitamin B12 supplementation - EEG (ordered) - Hold Flomax. - IVF - Speech therapy - OOB to chair during daylight hours with blinds open. Lights off on at night with TV off - Avoid sedating meds - Stop memantine. This medication can paradoxically worsen cognition in dementia patients - Continue Aricept - Blood culture x 2   Electronically signed: Dr. Caryl Pina 01/30/2021, 1:11 AM

## 2021-01-31 ENCOUNTER — Encounter (HOSPITAL_COMMUNITY): Payer: Self-pay | Admitting: *Deleted

## 2021-01-31 ENCOUNTER — Observation Stay (HOSPITAL_COMMUNITY): Payer: Medicare Other

## 2021-01-31 DIAGNOSIS — Z87891 Personal history of nicotine dependence: Secondary | ICD-10-CM | POA: Diagnosis not present

## 2021-01-31 DIAGNOSIS — R001 Bradycardia, unspecified: Secondary | ICD-10-CM | POA: Diagnosis not present

## 2021-01-31 DIAGNOSIS — Z7982 Long term (current) use of aspirin: Secondary | ICD-10-CM | POA: Diagnosis not present

## 2021-01-31 DIAGNOSIS — E785 Hyperlipidemia, unspecified: Secondary | ICD-10-CM | POA: Diagnosis present

## 2021-01-31 DIAGNOSIS — G9341 Metabolic encephalopathy: Secondary | ICD-10-CM | POA: Diagnosis present

## 2021-01-31 DIAGNOSIS — N400 Enlarged prostate without lower urinary tract symptoms: Secondary | ICD-10-CM | POA: Diagnosis present

## 2021-01-31 DIAGNOSIS — F0390 Unspecified dementia without behavioral disturbance: Secondary | ICD-10-CM | POA: Diagnosis present

## 2021-01-31 DIAGNOSIS — G8194 Hemiplegia, unspecified affecting left nondominant side: Secondary | ICD-10-CM | POA: Diagnosis present

## 2021-01-31 DIAGNOSIS — Z83438 Family history of other disorder of lipoprotein metabolism and other lipidemia: Secondary | ICD-10-CM | POA: Diagnosis not present

## 2021-01-31 DIAGNOSIS — I4430 Unspecified atrioventricular block: Secondary | ICD-10-CM | POA: Diagnosis present

## 2021-01-31 DIAGNOSIS — I10 Essential (primary) hypertension: Secondary | ICD-10-CM | POA: Diagnosis present

## 2021-01-31 DIAGNOSIS — R471 Dysarthria and anarthria: Secondary | ICD-10-CM | POA: Diagnosis present

## 2021-01-31 DIAGNOSIS — Z79899 Other long term (current) drug therapy: Secondary | ICD-10-CM | POA: Diagnosis not present

## 2021-01-31 DIAGNOSIS — I2510 Atherosclerotic heart disease of native coronary artery without angina pectoris: Secondary | ICD-10-CM | POA: Diagnosis present

## 2021-01-31 DIAGNOSIS — I252 Old myocardial infarction: Secondary | ICD-10-CM | POA: Diagnosis not present

## 2021-01-31 DIAGNOSIS — Z8249 Family history of ischemic heart disease and other diseases of the circulatory system: Secondary | ICD-10-CM | POA: Diagnosis not present

## 2021-01-31 DIAGNOSIS — F05 Delirium due to known physiological condition: Secondary | ICD-10-CM | POA: Diagnosis not present

## 2021-01-31 DIAGNOSIS — R4182 Altered mental status, unspecified: Secondary | ICD-10-CM | POA: Diagnosis not present

## 2021-01-31 DIAGNOSIS — I25118 Atherosclerotic heart disease of native coronary artery with other forms of angina pectoris: Secondary | ICD-10-CM | POA: Diagnosis not present

## 2021-01-31 DIAGNOSIS — R410 Disorientation, unspecified: Secondary | ICD-10-CM | POA: Diagnosis present

## 2021-01-31 DIAGNOSIS — Z9852 Vasectomy status: Secondary | ICD-10-CM | POA: Diagnosis not present

## 2021-01-31 DIAGNOSIS — R29703 NIHSS score 3: Secondary | ICD-10-CM | POA: Diagnosis present

## 2021-01-31 DIAGNOSIS — I4892 Unspecified atrial flutter: Secondary | ICD-10-CM | POA: Diagnosis present

## 2021-01-31 DIAGNOSIS — Z955 Presence of coronary angioplasty implant and graft: Secondary | ICD-10-CM | POA: Diagnosis not present

## 2021-01-31 DIAGNOSIS — I483 Typical atrial flutter: Secondary | ICD-10-CM | POA: Diagnosis not present

## 2021-01-31 DIAGNOSIS — R443 Hallucinations, unspecified: Secondary | ICD-10-CM | POA: Diagnosis not present

## 2021-01-31 DIAGNOSIS — I480 Paroxysmal atrial fibrillation: Secondary | ICD-10-CM | POA: Diagnosis present

## 2021-01-31 DIAGNOSIS — Z20822 Contact with and (suspected) exposure to covid-19: Secondary | ICD-10-CM | POA: Diagnosis present

## 2021-01-31 MED ORDER — QUETIAPINE FUMARATE 25 MG PO TABS
50.0000 mg | ORAL_TABLET | Freq: Every evening | ORAL | Status: DC | PRN
  Administered 2021-01-31 – 2021-02-02 (×2): 50 mg via ORAL
  Filled 2021-01-31 (×2): qty 2

## 2021-01-31 MED ORDER — HALOPERIDOL LACTATE 5 MG/ML IJ SOLN
2.5000 mg | Freq: Once | INTRAMUSCULAR | Status: AC
  Administered 2021-01-31: 2.5 mg via INTRAVENOUS
  Filled 2021-01-31: qty 1

## 2021-01-31 MED ORDER — QUETIAPINE FUMARATE 25 MG PO TABS: 50.0000 mg | ORAL_TABLET | Freq: Every day | ORAL | Status: DC

## 2021-01-31 MED ORDER — QUETIAPINE FUMARATE 25 MG PO TABS
12.5000 mg | ORAL_TABLET | Freq: Once | ORAL | Status: AC
  Administered 2021-02-03: 12.5 mg via ORAL
  Filled 2021-01-31 (×2): qty 1

## 2021-01-31 NOTE — Progress Notes (Signed)
HOSPITAL MEDICINE OVERNIGHT EVENT NOTE    Patient exhibiting yet another episode of significant agitation.  Patient not following commands.  Patient has pulled off his telemetry leads, pulled out his IV.  Patient is refusing to go along with care plan.  Patient is not redirectable and therefore a sitter will likely not prove effective.  Last EKG reviewed with normal QTc.  We will provide patient with 2.5 mg of IV Haldol.  Marinda Elk  MD [facility]

## 2021-01-31 NOTE — Progress Notes (Signed)
EEG completed, results pending. 

## 2021-01-31 NOTE — Evaluation (Signed)
Physical Therapy Evaluation Patient Details Name: Adam Rowland MRN: [id number] DOB: 07-26-1946 Today's Date: 01/31/2021   History of Present Illness  Pt is a 75 y.o. M who presents with acute metabolic encephalopathy. CT, UDS, MRI unremarkable. EEG negative for seizures. Significant PMH: dementia, CAD, HTN, HLD.  Clinical Impression  PTA, pt lives with his wife and is independent with mobility; requires assist with IADL's. Pt presents with decreased cognition, weakness, and poor standing balance. Requiring min assist currently for functional mobility; ambulating x 180 feet with a walker. Demonstrates slow, shuffling gait pattern and retropulsion. Pt at high risk for falls based on decreased gait speed and safety awareness. Unfortunately, pt does not feel she can provide adequate level of assist to care for pt at home. Currently recommending SNF to address deficits and decrease caregiver burden.   Follow Up Recommendations SNF;Supervision/Assistance - 24 hour (pending improved mentation, may progress back home)    Equipment Recommendations  Rolling walker with 5" wheels;3in1 (PT)    Recommendations for Other Services       Precautions / Restrictions Precautions Precautions: Fall Restrictions Weight Bearing Restrictions: No      Mobility  Bed Mobility               General bed mobility comments: OOB in chair    Transfers Overall transfer level: Needs assistance Equipment used: None Transfers: Sit to/from Stand Sit to Stand: Min assist         General transfer comment: MinA to rise to stand from recliner, pt reaching back with LUE for chair arm for increased external support, so placed RW in front of him  Ambulation/Gait Ambulation/Gait assistance: Min assist Gait Distance (Feet): 180 Feet Assistive device: Rolling walker (2 wheeled) Gait Pattern/deviations: Step-through pattern;Decreased stride length;Leaning posteriorly;Shuffle Gait velocity: decreased Gait velocity  interpretation: <1.31 ft/sec, indicative of household ambulator General Gait Details: Pt with mild retropulsion, requiring minA for stability, frequent walker assist to maintain straight path, assist with turns and negotiate around obstacles. Intermittent freezing of gait, slow, shuffling gait pattern  Stairs            Wheelchair Mobility    Modified Rankin (Stroke Patients Only)       Balance Overall balance assessment: Needs assistance Sitting-balance support: Feet supported Sitting balance-Leahy Scale: Fair     Standing balance support: Bilateral upper extremity supported;During functional activity Standing balance-Leahy Scale: Poor Standing balance comment: reliant on external support                             Pertinent Vitals/Pain Pain Assessment: Faces Faces Pain Scale: No hurt    Home Living Family/patient expects to be discharged to:: Private residence Living Arrangements: Spouse/significant other Available Help at Discharge: Family;Available 24 hours/day Type of Home: House Home Access: Stairs to enter Entrance Stairs-Rails: Lawyer of Steps: 3 Home Layout: Two level Home Equipment: None      Prior Function Level of Independence: Needs assistance   Gait / Transfers Assistance Needed: independent, pt wife reports slower gait speed but able to ambulate community distances  ADL's / Homemaking Assistance Needed: assist for IADL's        Hand Dominance        Extremity/Trunk Assessment   Upper Extremity Assessment Upper Extremity Assessment: Generalized weakness    Lower Extremity Assessment Lower Extremity Assessment: Generalized weakness    Cervical / Trunk Assessment Cervical / Trunk Assessment: Kyphotic  Communication  Communication: No difficulties  Cognition Arousal/Alertness: Awake/alert Behavior During Therapy: WFL for tasks assessed/performed Overall Cognitive Status: Impaired/Different  from baseline Area of Impairment: Orientation;Attention;Memory;Safety/judgement;Awareness;Problem solving                 Orientation Level: Disoriented to;Place;Time;Situation Current Attention Level: Sustained Memory: Decreased short-term memory   Safety/Judgement: Decreased awareness of safety;Decreased awareness of deficits Awareness: Intellectual Problem Solving: Requires verbal cues;Difficulty sequencing;Slow processing General Comments: Pt able to state his name and birthday with increased time. When asked if he was at home, pt responding, "yes." Pt stating, he was "9," in response to his age. Able to follow 1 step commands, requires redirection for sustaining attention intermittently. Fluent speech, but often unrelated to question or topic in discussion      General Comments      Exercises     Assessment/Plan    PT Assessment Patient needs continued PT services  PT Problem List Decreased strength;Decreased activity tolerance;Decreased balance;Decreased mobility;Decreased cognition;Decreased safety awareness       PT Treatment Interventions DME instruction;Gait training;Functional mobility training;Stair training;Therapeutic activities;Therapeutic exercise;Balance training;Patient/family education    PT Goals (Current goals can be found in the Care Plan section)  Acute Rehab PT Goals Patient Stated Goal: pt wife would like for his mentation to improve and get stronger PT Goal Formulation: With patient/family Time For Goal Achievement: 02/14/21 Potential to Achieve Goals: Good    Frequency Min 3X/week   Barriers to discharge        Co-evaluation               AM-PAC PT "6 Clicks" Mobility  Outcome Measure Help needed turning from your back to your side while in a flat bed without using bedrails?: A Little Help needed moving from lying on your back to sitting on the side of a flat bed without using bedrails?: A Little Help needed moving to and from a  bed to a chair (including a wheelchair)?: A Little Help needed standing up from a chair using your arms (e.g., wheelchair or bedside chair)?: A Little Help needed to walk in hospital room?: A Little Help needed climbing 3-5 steps with a railing? : A Lot 6 Click Score: 17    End of Session Equipment Utilized During Treatment: Gait belt Activity Tolerance: Patient tolerated treatment well Patient left: in chair;with call bell/phone within reach;with chair alarm set Nurse Communication: Mobility status PT Visit Diagnosis: Unsteadiness on feet (R26.81);Difficulty in walking, not elsewhere classified (R26.2)    Time: 1535-1601 PT Time Calculation (min) (ACUTE ONLY): 26 min   Charges:   PT Evaluation $PT Eval Moderate Complexity: 1 Mod PT Treatments $Therapeutic Activity: 8-22 mins        Lillia Pauls, PT, DPT Acute Rehabilitation Services Pager [phone] Office [phone]   Norval Morton 01/31/2021, 4:29 PM

## 2021-01-31 NOTE — Procedures (Signed)
Patient Name: Adam Rowland  MRN: [id number]  Epilepsy Attending: Charlsie Quest  Referring Physician/Provider: Dr Caryl Pina Date: 01/31/2021 Duration: 22.26 mins  Patient history: 75yo M with ams. EEG to evaluate for seizure  Level of alertness: Awake  AEDs during EEG study: None  Technical aspects: This EEG study was done with scalp electrodes positioned according to the 10-20 International system of electrode placement. Electrical activity was acquired at a sampling rate of 500Hz  and reviewed with a high frequency filter of 70Hz  and a low frequency filter of 1Hz . EEG data were recorded continuously and digitally stored.   Description: The posterior dominant rhythm consists of 7 Hz activity of moderate voltage (25-35 uV) seen predominantly in posterior head regions, symmetric and reactive to eye opening and eye closing. EEG showed continuous generalized and maximal left temporal 3 to 6 Hz theta-delta slowing.  Hyperventilation and photic stimulation were not performed.     ABNORMALITY - Continuous slow, generalized and maximal left temporal  IMPRESSION: This study is suggestive of non-specific cortical dysfunction arising from left temporal region. Additionally there is moderate diffuse encephalopathy, nonspecific etiology. No seizures or epileptiform discharges were seen throughout the recording.  Geralene Afshar 

## 2021-01-31 NOTE — Progress Notes (Signed)
Neurology Progress Note  Patient ID: Adam Rowland is a 75 y.o. with PMHx of  has a past medical history of CAD (coronary artery disease), Cardiomyopathy, ischemic, Cataract, Fracture, ankle (06/12/2014), Hyperlipidemia, Hypertension, Myocardial infarction (HCC) (2015), Nephrolithiasis (01/16/2013), and NSVT (nonsustained ventricular tachycardia) (HCC).  Initially consulted for: Acute confusion   Major interval events:  - Mental status has waxed and waned   Subjective: - No acute   Exam: Vitals:   01/31/21 1059 01/31/21 1515  BP: 126/74 (!) 152/88  Pulse: 77 68  Resp: 18 18  Temp: 97.7 F (36.5 C) 98 F (36.7 C)  SpO2: 100% 100%   Gen: In bed, comfortable  Resp: non-labored breathing, no grossly audible wheezing Cardiac: Perfusing extremities well  Abd: soft, nt Extremities: Soft mitts in place in the bilateral hands  Neuro: MS: Oriented to self only.  Able to follow some simple commands.  Able to answer simple questions but with longer conversation speech becomes tangential with some nonsensical speech including neologisms at times becoming word salad CN: Pupils equal round, tracks examiner in all directions, face symmetric Motor: Moving bilateral upper extremities and lower extremities equally and antigravity. Sensory: Intact to light touch in all 4 extremities   Pertinent Labs: CBC and CMP essentially stable today  Impression: 75 year old man with dementia presenting with an acute confusional episode.  MRI has ruled out structural brain lesion and EEG is ruled out nonconvulsive status epilepticus.  Left temporal slowing on EEG matches the clinical symptoms of aphasia that have dominated his dementia presentation per family.  Had a long discussion with his wife discussing how patients with dementia typically do best in their home environment but that he does need to be able to be safely managed in the home environment with 24/7 supervision at this time.  Discussed that his  agitation overnight in the hospital is consistent with sundowning and dementia.  Discussed that rehab may be a less deliriogenic environment than the hospital.  All of her questions were answered  Recommendations: -Seroquel 12.5 mg trial tonight, if patient is not discharged tomorrow this may be used as needed in the hospital but would not discharge on this medication -Continue Aricept and continue to hold memantine and Flomax as per Dr. Shelbie Hutching prior recommendations -Continue vitamin B-12 supplementation -No further inpatient neurological work-up, outpatient follow-up with neurology as appropriate -Neurology will be available on an as-needed basis going forward, please page if new questions arise  Brooke Dare MD-PhD [facility] [phone]   Over 35 minutes were spent in the care of this patient today, of which over half was at bedside including discussion with wife and examination patient as documented above.

## 2021-01-31 NOTE — Progress Notes (Signed)
PROGRESS NOTE    Adam Rowland  MLY:[id number] DOB: 1945-12-10 DOA: 01/29/2021 PCP: Bennie Pierini, FNP   Brief Narrative:  Adam Rowland is a 75 y.o. male with medical history significant of dementia, coronary artery disease, hypertension, hyperlipidemia, who lives at home with his wife.  Patient was in his usual state of health the night prior to admission, he woke up in the morning confused unable to carry on a meaningful conversation with slurred speech.  Notable ambulation but no clear explanation of possible deficits but was extremely high fall risk which is unusual for him.  Patient ultimately brought to the ED for further evaluation, admitted for acute CVA work-up and neurology was consulted.  Of note no changes in patient's lifestyle food no recent travels only new medication was Flomax initiated 48 hours prior to admission.  Assessment & Plan:   Active Problems:   Hyperlipidemia   Hypertension   CAD (coronary artery disease)   Dementia (HCC)   AMS (altered mental status)   AF (paroxysmal atrial fibrillation) (HCC)   Acute metabolic encephalopathy, likely secondary to sundowning/hospital delirium -Neurology following, appreciate insight and recommendations  -Patient's imaging including CT head and MRI are unremarkable  -UDS unremarkable, no history of substance abuse or issues with alcohol per family or patient -currently holding Flomax and memantine per neurology recommendations -No signs or symptoms of infectious process -Mental status previously improving almost back to baseline on the fourth, however overnight patient did not sleep per staff and is now oriented only to self, found out of bed this morning questionably hallucinating -EEG unremarkable for epileptiform activity  Provoked afib/flutter -Patient's wife states he has had diagnosis of A. fib in the past but is not currently on anticoagulation for unclear reasoning (potentially low afib burden or bleeding  risk?) -At this time he is rate controlled, this event of A. Fib seems to be provoked secondary to above - continue to follow  Hypertension  -Continue metoprolol and lisinopril  Hyperlipidemia -Continue on statin  Dementia, unspecified subtype -Continue on home dose of Aricept and Namenda  Hx of Coronary artery disease - No complaints of chest pain at this time - Continue aspirin, beta-blocker and statin  DVT prophylaxis: Lovenox Code Status: Full code Family Communication: At bedside  Status is: Inpatient  Dispo: The patient is from: Home              Anticipated d/c is to: Home              Anticipated d/c date is: 24 to 48 hours              Patient currently not medically stable for discharge  Consultants:   Neurology  Procedures:   None  Antimicrobials:  None indicated  Subjective: Patient did not sleep overnight now more acutely altered from baseline, hallucinating and seeing people in the room, this morning oriented only to person and wife, confused about location date and general situation.  Review of systems markedly limited.  Objective: Vitals:   01/31/21 0019 01/31/21 0347 01/31/21 0740 01/31/21 1059  BP: (!) 142/62 (!) 141/74 138/64 126/74  Pulse: (!) 105 68 75 77  Resp: 19 20 20 18   Temp:  97.8 F (36.6 C) 97.6 F (36.4 C) 97.7 F (36.5 C)  TempSrc:  Oral Oral Oral  SpO2:  96% 97% 100%  Weight:      Height:        Intake/Output Summary (Last 24 hours) at 01/31/2021 1403 Last  data filed at 01/31/2021 1000 Gross per 24 hour  Intake --  Output 1100 ml  Net -1100 ml   Filed Weights   01/29/21 2215  Weight: 73.4 kg    Examination:  General exam: Appears calm; resting comfortably in bed Respiratory system: Clear to auscultation. Respiratory effort normal. Cardiovascular system: S1 & S2 heard, RRR. No JVD, murmurs, rubs, gallops or clicks. No pedal edema. Gastrointestinal system: Abdomen is nondistended, soft and nontender. No  organomegaly or masses felt. Normal bowel sounds heard. Central nervous system: Alert and oriented. No focal neurological deficits. Extremities: Symmetric 5 x 5 power. Skin: No rashes, lesions or ulcers  Data Reviewed: I have personally reviewed following labs and imaging studies  CBC: Recent Labs  Lab 01/28/21 0923 01/29/21 0859 01/29/21 1705 01/30/21 0027  WBC 6.2 8.8 6.7 7.1  NEUTROABS 4.2 7.3  --   --   HGB 15.8 15.0 15.9 14.4  HCT 45.0 44.2 47.3 41.2  MCV 96 100.2* 101.1* 96.7  PLT 212 164 144* 142*   Basic Metabolic Panel: Recent Labs  Lab 01/28/21 0923 01/29/21 0859 01/29/21 1705 01/30/21 0027  NA 141 136  --  136  K 5.0 4.2  --  3.8  CL 103 103  --  105  CO2 25 25  --  23  GLUCOSE 132* 130*  --  150*  BUN 15 19  --  17  CREATININE 0.97 0.84 0.79 1.05  CALCIUM 9.3 8.6*  --  8.4*  MG  --   --   --  1.9   GFR: Estimated Creatinine Clearance: 61.7 mL/min (by C-G formula based on SCr of 1.05 mg/dL). Liver Function Tests: Recent Labs  Lab 01/28/21 0923 01/29/21 0859 01/30/21 0027  AST 17 19 19   ALT 17 18 19   ALKPHOS 59 50 43  BILITOT 0.8 1.2 0.9  PROT 6.5 6.6 5.4*  ALBUMIN 4.4 3.9 3.1*   No results for input(s): LIPASE, AMYLASE in the last 168 hours. Recent Labs  Lab 01/29/21 1705  AMMONIA 13   Coagulation Profile: Recent Labs  Lab 01/29/21 0859  INR 1.1   Cardiac Enzymes: No results for input(s): CKTOTAL, CKMB, CKMBINDEX, TROPONINI in the last 168 hours. BNP (last 3 results) No results for input(s): PROBNP in the last 8760 hours. HbA1C: No results for input(s): HGBA1C in the last 72 hours. CBG: Recent Labs  Lab 01/29/21 0843 01/29/21 2105  GLUCAP 126* 103*   Lipid Profile: No results for input(s): CHOL, HDL, LDLCALC, TRIG, CHOLHDL, LDLDIRECT in the last 72 hours. Thyroid Function Tests: Recent Labs    01/29/21 0859  TSH 1.321   Anemia Panel: Recent Labs    01/29/21 0859  VITAMINB12 259   Sepsis Labs: No results for  input(s): PROCALCITON, LATICACIDVEN in the last 168 hours.  Recent Results (from the past 240 hour(s))  Microscopic Examination     Status: None   Collection Time: 01/28/21  8:36 AM   Urine  Result Value Ref Range Status   WBC, UA None seen 0 - 5 /hpf Final   RBC None seen 0 - 2 /hpf Final   Epithelial Cells (non renal) None seen 0 - 10 /hpf Final   Bacteria, UA None seen None seen/Few Final  Resp Panel by RT-PCR (Flu A&B, Covid) Nasopharyngeal Swab     Status: None   Collection Time: 01/29/21  8:45 AM   Specimen: Nasopharyngeal Swab; Nasopharyngeal(NP) swabs in vial transport medium  Result Value Ref Range Status   SARS  Coronavirus 2 by RT PCR NEGATIVE NEGATIVE Final    Comment: (NOTE) SARS-CoV-2 target nucleic acids are NOT DETECTED.  The SARS-CoV-2 RNA is generally detectable in upper respiratory specimens during the acute phase of infection. The lowest concentration of SARS-CoV-2 viral copies this assay can detect is 138 copies/mL. A negative result does not preclude SARS-Cov-2 infection and should not be used as the sole basis for treatment or other patient management decisions. A negative result may occur with  improper specimen collection/handling, submission of specimen other than nasopharyngeal swab, presence of viral mutation(s) within the areas targeted by this assay, and inadequate number of viral copies(<138 copies/mL). A negative result must be combined with clinical observations, patient history, and epidemiological information. The expected result is Negative.  Fact Sheet for Patients:  BloggerCourse.com  Fact Sheet for Healthcare Providers:  SeriousBroker.it  This test is no t yet approved or cleared by the [place] FDA and  has been authorized for detection and/or diagnosis of SARS-CoV-2 by FDA under an Emergency Use Authorization (EUA). This EUA will remain  in effect (meaning this test can be used) for  the duration of the COVID-19 declaration under Section 564(b)(1) of the Act, 21 U.S.C.section 360bbb-3(b)(1), unless the authorization is terminated  or revoked sooner.       Influenza A by PCR NEGATIVE NEGATIVE Final   Influenza B by PCR NEGATIVE NEGATIVE Final    Comment: (NOTE) The Xpert Xpress SARS-CoV-2/FLU/RSV plus assay is intended as an aid in the diagnosis of influenza from Nasopharyngeal swab specimens and should not be used as a sole basis for treatment. Nasal washings and aspirates are unacceptable for Xpert Xpress SARS-CoV-2/FLU/RSV testing.  Fact Sheet for Patients: BloggerCourse.com  Fact Sheet for Healthcare Providers: SeriousBroker.it  This test is not yet approved or cleared by the [place] FDA and has been authorized for detection and/or diagnosis of SARS-CoV-2 by FDA under an Emergency Use Authorization (EUA). This EUA will remain in effect (meaning this test can be used) for the duration of the COVID-19 declaration under Section 564(b)(1) of the Act, 21 U.S.C. section 360bbb-3(b)(1), unless the authorization is terminated or revoked.  Performed at [facility], [address], [place], [place] [address]          Radiology Studies: EEG adult  Result Date: 02/03/21 Adam Quest, Rowland     February 03, 2021  1:59 PM Patient Name: Adam Rowland MRN: [id number] Epilepsy Attending: Charlsie Rowland Referring Physician/Provider: Dr Caryl Pina Date: February 03, 2021 Duration: 22.26 mins Patient history: 75yo M with ams. EEG to evaluate for seizure Level of alertness: Awake AEDs during EEG study: None Technical aspects: This EEG study was done with scalp electrodes positioned according to the 10-20 International system of electrode placement. Electrical activity was acquired at a sampling rate of 500Hz  and reviewed with a high frequency filter of 70Hz  and a low frequency filter of 1Hz . EEG data were recorded continuously and  digitally stored. Description: The posterior dominant rhythm consists of 7 Hz activity of moderate voltage (25-35 uV) seen predominantly in posterior head regions, symmetric and reactive to eye opening and eye closing. EEG showed continuous generalized and maximal left temporal 3 to 6 Hz theta-delta slowing.  Hyperventilation and photic stimulation were not performed.   ABNORMALITY - Continuous slow, generalized and maximal left temporal IMPRESSION: This study is suggestive of non-specific cortical dysfunction arising from left temporal region. Additionally there is moderate diffuse encephalopathy, nonspecific etiology. No seizures or epileptiform discharges were seen throughout the recording.  Adam Rowland   ECHOCARDIOGRAM COMPLETE  Result Date: 01/30/2021    ECHOCARDIOGRAM REPORT   Patient Name:   Adam Rowland Date of Exam: 01/30/2021 Medical Rec #:  [id number]   Height:       69.0 in Accession #:    [id number]  Weight:       161.8 lb Date of Birth:  [birth year]   BSA:          1.888 m Patient Age:    75 years    BP:           105/67 mmHg Patient Gender: M           HR:           77 bpm. Exam Location:  Inpatient Procedure: 2D Echo Indications:    atrial flutter  History:        Patient has prior history of Echocardiogram examinations, most                 recent 09/05/2019. CAD, Arrythmias:Atrial Fibrillation,                 Signs/Symptoms:Altered Mental Status; Risk Factors:Hypertension                 and Dyslipidemia.  Sonographer:    Delcie Roch Referring Phys: [id number] Deno Lunger [place] IMPRESSIONS  1. Left ventricular ejection fraction, by estimation, is 55 to 60%. The left ventricle has normal function. The left ventricle has no regional Kohen motion abnormalities. There is mild asymmetric left ventricular hypertrophy of the septal segment. Left ventricular diastolic parameters are indeterminate in the setting of atrial flutter.  2. Right ventricular systolic function is low normal. The right  ventricular size is normal. Tricuspid regurgitation signal is inadequate for assessing PA pressure.  3. The mitral valve is grossly normal. Trivial mitral valve regurgitation.  4. The aortic valve is tricuspid. Aortic valve regurgitation is not visualized.  5. The inferior vena cava is normal in size with greater than 50% respiratory variability, suggesting right atrial pressure of 3 mmHg. FINDINGS  Left Ventricle: Left ventricular ejection fraction, by estimation, is 55 to 60%. The left ventricle has normal function. The left ventricle has no regional Bamberg motion abnormalities. The left ventricular internal cavity size was normal in size. There is  mild asymmetric left ventricular hypertrophy of the septal segment. Left ventricular diastolic parameters are indeterminate. Right Ventricle: The right ventricular size is normal. No increase in right ventricular Samuelson thickness. Right ventricular systolic function is low normal. Tricuspid regurgitation signal is inadequate for assessing PA pressure. Left Atrium: Left atrial size was normal in size. Right Atrium: Right atrial size was normal in size. Pericardium: There is no evidence of pericardial effusion. Mitral Valve: The mitral valve is grossly normal. Mild mitral annular calcification. Trivial mitral valve regurgitation. Tricuspid Valve: The tricuspid valve is grossly normal. Tricuspid valve regurgitation is trivial. Aortic Valve: The aortic valve is tricuspid. Aortic valve regurgitation is not visualized. Pulmonic Valve: The pulmonic valve was grossly normal. Pulmonic valve regurgitation is trivial. Aorta: The aortic root is normal in size and structure. Venous: The inferior vena cava is normal in size with greater than 50% respiratory variability, suggesting right atrial pressure of 3 mmHg. IAS/Shunts: The interatrial septum appears to be lipomatous. No atrial level shunt detected by color flow Doppler.  LEFT VENTRICLE PLAX 2D LVIDd:         4.00 cm LVIDs:  2.70 cm LV PW:         1.00 cm LV IVS:        1.20 cm LVOT diam:     1.70 cm LVOT Area:     2.27 cm  RIGHT VENTRICLE             IVC RV S prime:     11.60 cm/s  IVC diam: 1.60 cm TAPSE (M-mode): 1.0 cm LEFT ATRIUM             Index       RIGHT ATRIUM           Index LA diam:        3.70 cm 1.96 cm/m  RA Area:     15.00 cm LA Vol (A2C):   43.0 ml 22.77 ml/m RA Volume:   36.40 ml  19.28 ml/m LA Vol (A4C):   59.1 ml 31.30 ml/m LA Biplane Vol: 54.8 ml 29.02 ml/m   AORTA Ao Root diam: 3.40 cm Ao Asc diam:  3.40 cm  SHUNTS Systemic Diam: 1.70 cm Adam Rowland Electronically signed by Adam Rowland Signature Date/Time: 01/30/2021/1:09:49 PM    Final    Scheduled Meds: . aspirin  81 mg Oral Daily  . atorvastatin  40 mg Oral QHS  . donepezil  10 mg Oral QHS  . enoxaparin (LOVENOX) injection  40 mg Subcutaneous Q24H  . lisinopril  20 mg Oral Daily  . metoprolol tartrate  25 mg Oral BID   Continuous Infusions: . lactated ringers 75 mL/hr at 01/30/21 1805     LOS: 0 days   Time spent:  Azucena Fallen, DO [facility]  If 7PM-7AM, please contact night-coverage [email or web address]  01/31/2021, 2:03 PM

## 2021-01-31 NOTE — Plan of Care (Signed)

## 2021-02-01 LAB — BASIC METABOLIC PANEL
Anion gap: 7 (ref 5–15)
BUN: 13 mg/dL (ref 8–23)
CO2: 25 mmol/L (ref 22–32)
Calcium: 8.5 mg/dL — ABNORMAL LOW (ref 8.9–10.3)
Chloride: 106 mmol/L (ref 98–111)
Creatinine, Ser: 0.99 mg/dL (ref 0.61–1.24)
GFR, Estimated: >60 mL/min (ref >60)
Glucose, Bld: 108 mg/dL — ABNORMAL HIGH (ref 70–99)
Potassium: 3.8 mmol/L (ref 3.5–5.1)
Sodium: 138 mmol/L (ref 135–145)

## 2021-02-01 LAB — CBC
HCT: 40.9 % (ref 39.0–52.0)
Hemoglobin: 13.9 g/dL (ref 13.0–17.0)
MCH: 33.3 pg (ref 26.0–34.0)
MCHC: 34 g/dL (ref 30.0–36.0)
MCV: 98.1 fL (ref 80.0–100.0)
Platelets: 178 10*3/uL (ref 150–400)
RBC: 4.17 MIL/uL — ABNORMAL LOW (ref 4.22–5.81)
RDW: 11.9 % (ref 11.5–15.5)
WBC: 8.5 10*3/uL (ref 4.0–10.5)
nRBC: 0 % (ref 0.0–0.2)

## 2021-02-01 MED ORDER — SENNOSIDES-DOCUSATE SODIUM 8.6-50 MG PO TABS
1.0000 | ORAL_TABLET | Freq: Two times a day (BID) | ORAL | Status: DC
  Administered 2021-02-01 – 2021-02-03 (×5): 1 via ORAL
  Filled 2021-02-01 (×6): qty 1

## 2021-02-01 MED ORDER — POLYETHYLENE GLYCOL 3350 17 G PO PACK
17.0000 g | PACK | Freq: Two times a day (BID) | ORAL | Status: DC
  Administered 2021-02-01 – 2021-02-03 (×5): 17 g via ORAL
  Filled 2021-02-01 (×7): qty 1

## 2021-02-01 NOTE — TOC Initial Note (Signed)
Transition of Care [facility]) - Initial/Assessment Note    Patient Details  Name: Adam Rowland MRN: [id number] Date of Birth: Feb 03, 1946  Transition of Care [facility]) CM/SW Contact:    Eduard Roux, LCSW Phone Number: 02/01/2021, 10:02 AM  Clinical Narrative:                  CSW spoke with patient's wife,[name]- CSW introduced self and explained role. CSW discussed PT recommendation of short term rehab at [facility]. She believes it is best for the patient to discharge home and receive [facility] services  in a familiar environment. She hopes his confusion will improve/resolve once he is home. She states no preferred [facility] agency and wants the recommend DME. She states no questions or concerns at this time. RNCM updated.  Antony Blackbird, MSW, LCSW Clinical Social Worker   Expected Discharge Plan: Home w Home Health Services Barriers to Discharge: Continued Medical Work up   Patient Goals and CMS Choice        Expected Discharge Plan and Services Expected Discharge Plan: Home w Home Health Services In-house Referral: Clinical Social Work                                            Prior Living Arrangements/Services   Lives with:: Self,Spouse          Need for Family Participation in Patient Care: Yes (Comment) Care giver support system in place?: Yes (comment)      Activities of Daily Living   ADL Screening (condition at time of admission) Patient's cognitive ability adequate to safely complete daily activities?: Yes Is the patient deaf or have difficulty hearing?: Yes Does the patient have difficulty seeing, even when wearing glasses/contacts?: No Does the patient have difficulty concentrating, remembering, or making decisions?: Yes Patient able to express need for assistance with ADLs?: Yes Does the patient have difficulty dressing or bathing?: Yes Independently performs ADLs?: Yes (appropriate for developmental age) Does the patient have difficulty walking or climbing  stairs?: Yes Weakness of Legs: Both Weakness of Arms/Hands: Both  Permission Sought/Granted                  Emotional Assessment         Alcohol / Substance Use: Not Applicable Psych Involvement: No (comment)  Admission diagnosis:  Disorientation [R41.0] AMS (altered mental status) [R41.82] Acute metabolic encephalopathy [G93.41] Patient Active Problem List   Diagnosis Date Noted  . Acute metabolic encephalopathy 01/31/2021  . AF (paroxysmal atrial fibrillation) (HCC) 01/30/2021  . AMS (altered mental status) 01/29/2021  . Acute prostatitis 01/28/2021  . Dementia (HCC) 06/06/2018  . Metabolic syndrome 05/27/2015  . BMI 27.0-27.9,adult 05/21/2015  . CAD (coronary artery disease) 06/21/2014  . Cardiomyopathy, ischemic 06/21/2014  . NSTEMI (non-ST elevated myocardial infarction) (HCC) 06/19/2014  . Hypertension 01/16/2014  . Hyperlipidemia 01/16/2013   PCP:  Bennie Pierini, FNP Pharmacy:   [facility] [place], [place] - [address] [place] [address] Phone: [phone] Fax: [phone]     Social Determinants of Health (SDOH) Interventions    Readmission Risk Interventions No flowsheet data found.

## 2021-02-01 NOTE — Progress Notes (Signed)
PROGRESS NOTE    Adam LimboLeon W Pieri  EAV:[id number]:[id number] DOB: 02-Jan-1946 DOA: 01/29/2021 PCP: Bennie PieriniMartin, [name], FNP   Brief Narrative:  Adam Rowland is a 75 y.o. male with medical history significant of dementia, coronary artery disease, hypertension, hyperlipidemia, who lives at home with his wife.  Patient was in his usual state of health the night prior to admission, he woke up in the morning confused unable to carry on a meaningful conversation with slurred speech.  Notable ambulation but no clear explanation of possible deficits but was extremely high fall risk which is unusual for him.  Patient ultimately brought to the ED for further evaluation, admitted for acute CVA work-up and neurology was consulted.  Of note no changes in patient's lifestyle food no recent travels only new medication was Flomax initiated 48 hours prior to admission.  Assessment & Plan:   Active Problems:   Hyperlipidemia   Hypertension   CAD (coronary artery disease)   Dementia (HCC)   AMS (altered mental status)   AF (paroxysmal atrial fibrillation) (HCC)   Acute metabolic encephalopathy   Acute metabolic encephalopathy, likely secondary to sundowning/hospital delirium, ongoing, somewhat worsening over the past 24 hours -Neurology following, appreciate insight and recommendations  -Patient's imaging including CT head and MRI are unremarkable  -UDS unremarkable, no history of substance abuse or issues with alcohol per family or patient -currently holding Flomax and memantine per neurology recommendations -No signs or symptoms of infectious process -Mental status previously improving almost back to baseline on the fourth, unfortunately since that time patient has had worsening sleep hygiene, requiring multiple medications for mood over the past 48 hours with worsening sundowning/delirium last night requiring both Seroquel and Haldol. -EEG unremarkable for epileptiform activity  Provoked afib/flutter,  resolved -Patient's wife states he has had diagnosis of A. fib in the past but is not currently on anticoagulation for unclear reasoning (potentially low afib burden or bleeding risk?) -Patient converted to sinus rhythm overnight, borderline bradycardia this morning although does not appear to be symptomatic, blood pressure within normal limits -Repeat EKG confirms sinus rhythm  Hypertension  -Continue metoprolol and lisinopril; borderline bradycardia overnight, if symptomatic would decrease metoprolol but at this time he is tolerating it quite well  Hyperlipidemia -Continue on statin  Dementia, unspecified subtype -Continue on home dose of Aricept and Namenda  Hx of Coronary artery disease - No complaints of chest pain at this time - Continue aspirin, beta-blocker and statin  DVT prophylaxis: Lovenox Code Status: Full code Family Communication: At bedside  Status is: Inpatient  Dispo: The patient is from: Home              Anticipated d/c is to: Home              Anticipated d/c date is: 24 to 48 hours              Patient currently not medically stable for discharge  Consultants:   Neurology  Procedures:   None  Antimicrobials:  None indicated  Subjective: Patient had recurrent episode overnight of worsening mental status hallucinations requiring both Seroquel p.o. and IV Haldol for sedation.  This morning he is quite somnolent, able to orient to person only after lengthy interview with wife at bedside.  He was also able to identify his wife but orientation was worse than the past 48 hours.  Objective: Vitals:   01/31/21 1515 01/31/21 2003 02/01/21 0035 02/01/21 0532  BP: (!) 152/88 140/75 115/76 106/81  Pulse: 68 65  60 (!) 47  Resp: 18 14 16 13   Temp: 98 F (36.7 C) 97.7 F (36.5 C) 98.6 F (37 C) 97.8 F (36.6 C)  TempSrc: Oral Oral Oral Oral  SpO2: 100% 100% 94% 99%  Weight:      Height:        Intake/Output Summary (Last 24 hours) at 02/01/2021  0713 Last data filed at 01/31/2021 2010 Gross per 24 hour  Intake 480 ml  Output 1200 ml  Net -720 ml   Filed Weights   01/29/21 2215  Weight: 73.4 kg    Examination:  General exam: Somnolent, difficult to arouse but otherwise calm no acute distress Respiratory system: Clear to auscultation. Respiratory effort normal. Cardiovascular system: S1 & S2 heard, RRR. No JVD, murmurs, rubs, gallops or clicks. No pedal edema. Gastrointestinal system: Abdomen is nondistended, soft and nontender. No organomegaly or masses felt. Normal bowel sounds heard. Central nervous system: Asleep, difficult to arouse, oriented to person only Extremities: Symmetric 5 x 5 power. Skin: No rashes, lesions or ulcers  Data Reviewed: I have personally reviewed following labs and imaging studies  CBC: Recent Labs  Lab 01/28/21 0923 01/29/21 0859 01/29/21 1705 01/30/21 0027 02/01/21 0258  WBC 6.2 8.8 6.7 7.1 8.5  NEUTROABS 4.2 7.3  --   --   --   HGB 15.8 15.0 15.9 14.4 13.9  HCT 45.0 44.2 47.3 41.2 40.9  MCV 96 100.2* 101.1* 96.7 98.1  PLT 212 164 144* 142* 178   Basic Metabolic Panel: Recent Labs  Lab 01/28/21 0923 01/29/21 0859 01/29/21 1705 01/30/21 0027 02/01/21 0258  NA 141 136  --  136 138  K 5.0 4.2  --  3.8 3.8  CL 103 103  --  105 106  CO2 25 25  --  23 25  GLUCOSE 132* 130*  --  150* 108*  BUN 15 19  --  17 13  CREATININE 0.97 0.84 0.79 1.05 0.99  CALCIUM 9.3 8.6*  --  8.4* 8.5*  MG  --   --   --  1.9  --    GFR: Estimated Creatinine Clearance: 65.5 mL/min (by C-G formula based on SCr of 0.99 mg/dL). Liver Function Tests: Recent Labs  Lab 01/28/21 0923 01/29/21 0859 01/30/21 0027  AST 17 19 19   ALT 17 18 19   ALKPHOS 59 50 43  BILITOT 0.8 1.2 0.9  PROT 6.5 6.6 5.4*  ALBUMIN 4.4 3.9 3.1*   No results for input(s): LIPASE, AMYLASE in the last 168 hours. Recent Labs  Lab 01/29/21 1705  AMMONIA 13   Coagulation Profile: Recent Labs  Lab 01/29/21 0859  INR 1.1    Cardiac Enzymes: No results for input(s): CKTOTAL, CKMB, CKMBINDEX, TROPONINI in the last 168 hours. BNP (last 3 results) No results for input(s): PROBNP in the last 8760 hours. HbA1C: No results for input(s): HGBA1C in the last 72 hours. CBG: Recent Labs  Lab 01/29/21 0843 01/29/21 2105  GLUCAP 126* 103*   Lipid Profile: No results for input(s): CHOL, HDL, LDLCALC, TRIG, CHOLHDL, LDLDIRECT in the last 72 hours. Thyroid Function Tests: Recent Labs    01/29/21 0859  TSH 1.321   Anemia Panel: Recent Labs    01/29/21 0859  VITAMINB12 259   Sepsis Labs: No results for input(s): PROCALCITON, LATICACIDVEN in the last 168 hours.  Recent Results (from the past 240 hour(s))  Microscopic Examination     Status: None   Collection Time: 01/28/21  8:36 AM   Urine  Result Value Ref Range Status   WBC, UA None seen 0 - 5 /hpf Final   RBC None seen 0 - 2 /hpf Final   Epithelial Cells (non renal) None seen 0 - 10 /hpf Final   Bacteria, UA None seen None seen/Few Final  Resp Panel by RT-PCR (Flu A&B, Covid) Nasopharyngeal Swab     Status: None   Collection Time: 01/29/21  8:45 AM   Specimen: Nasopharyngeal Swab; Nasopharyngeal(NP) swabs in vial transport medium  Result Value Ref Range Status   SARS Coronavirus 2 by RT PCR NEGATIVE NEGATIVE Final    Comment: (NOTE) SARS-CoV-2 target nucleic acids are NOT DETECTED.  The SARS-CoV-2 RNA is generally detectable in upper respiratory specimens during the acute phase of infection. The lowest concentration of SARS-CoV-2 viral copies this assay can detect is 138 copies/mL. A negative result does not preclude SARS-Cov-2 infection and should not be used as the sole basis for treatment or other patient management decisions. A negative result may occur with  improper specimen collection/handling, submission of specimen other than nasopharyngeal swab, presence of viral mutation(s) within the areas targeted by this assay, and inadequate  number of viral copies(<138 copies/mL). A negative result must be combined with clinical observations, patient history, and epidemiological information. The expected result is Negative.  Fact Sheet for Patients:  BloggerCourse.com  Fact Sheet for Healthcare Providers:  SeriousBroker.it  This test is no t yet approved or cleared by the [place] FDA and  has been authorized for detection and/or diagnosis of SARS-CoV-2 by FDA under an Emergency Use Authorization (EUA). This EUA will remain  in effect (meaning this test can be used) for the duration of the COVID-19 declaration under Section 564(b)(1) of the Act, 21 U.S.C.section 360bbb-3(b)(1), unless the authorization is terminated  or revoked sooner.       Influenza A by PCR NEGATIVE NEGATIVE Final   Influenza B by PCR NEGATIVE NEGATIVE Final    Comment: (NOTE) The Xpert Xpress SARS-CoV-2/FLU/RSV plus assay is intended as an aid in the diagnosis of influenza from Nasopharyngeal swab specimens and should not be used as a sole basis for treatment. Nasal washings and aspirates are unacceptable for Xpert Xpress SARS-CoV-2/FLU/RSV testing.  Fact Sheet for Patients: BloggerCourse.com  Fact Sheet for Healthcare Providers: SeriousBroker.it  This test is not yet approved or cleared by the [place] FDA and has been authorized for detection and/or diagnosis of SARS-CoV-2 by FDA under an Emergency Use Authorization (EUA). This EUA will remain in effect (meaning this test can be used) for the duration of the COVID-19 declaration under Section 564(b)(1) of the Act, 21 U.S.C. section 360bbb-3(b)(1), unless the authorization is terminated or revoked.  Performed at [facility], [address], [place], [place] [address]          Radiology Studies: EEG adult  Result Date: 02-07-21 Charlsie Quest, MD     02-07-2021  1:59 PM  Patient Name: CARNEL TORNES MRN: [id number] Epilepsy Attending: Charlsie Quest Referring Physician/Provider: Dr Caryl Pina Date: February 07, 2021 Duration: 22.26 mins Patient history: 75yo M with ams. EEG to evaluate for seizure Level of alertness: Awake AEDs during EEG study: None Technical aspects: This EEG study was done with scalp electrodes positioned according to the 10-20 International system of electrode placement. Electrical activity was acquired at a sampling rate of 500Hz  and reviewed with a high frequency filter of 70Hz  and a low frequency filter of 1Hz . EEG data were recorded continuously and digitally stored. Description: The posterior  dominant rhythm consists of 7 Hz activity of moderate voltage (25-35 uV) seen predominantly in posterior head regions, symmetric and reactive to eye opening and eye closing. EEG showed continuous generalized and maximal left temporal 3 to 6 Hz theta-delta slowing.  Hyperventilation and photic stimulation were not performed.   ABNORMALITY - Continuous slow, generalized and maximal left temporal IMPRESSION: This study is suggestive of non-specific cortical dysfunction arising from left temporal region. Additionally there is moderate diffuse encephalopathy, nonspecific etiology. No seizures or epileptiform discharges were seen throughout the recording. Charlsie Quest   ECHOCARDIOGRAM COMPLETE  Result Date: 01/30/2021    ECHOCARDIOGRAM REPORT   Patient Name:   ZHAMIR PIRRO Vanbergen Date of Exam: 01/30/2021 Medical Rec #:  [id number]   Height:       69.0 in Accession #:    [id number]  Weight:       161.8 lb Date of Birth:  [birth year]   BSA:          1.888 m Patient Age:    75 years    BP:           105/67 mmHg Patient Gender: M           HR:           77 bpm. Exam Location:  Inpatient Procedure: 2D Echo Indications:    atrial flutter  History:        Patient has prior history of Echocardiogram examinations, most                 recent 09/05/2019. CAD, Arrythmias:Atrial Fibrillation,                  Signs/Symptoms:Altered Mental Status; Risk Factors:Hypertension                 and Dyslipidemia.  Sonographer:    Delcie Roch Referring Phys: [id number] Deno Lunger [place] IMPRESSIONS  1. Left ventricular ejection fraction, by estimation, is 55 to 60%. The left ventricle has normal function. The left ventricle has no regional Buzan motion abnormalities. There is mild asymmetric left ventricular hypertrophy of the septal segment. Left ventricular diastolic parameters are indeterminate in the setting of atrial flutter.  2. Right ventricular systolic function is low normal. The right ventricular size is normal. Tricuspid regurgitation signal is inadequate for assessing PA pressure.  3. The mitral valve is grossly normal. Trivial mitral valve regurgitation.  4. The aortic valve is tricuspid. Aortic valve regurgitation is not visualized.  5. The inferior vena cava is normal in size with greater than 50% respiratory variability, suggesting right atrial pressure of 3 mmHg. FINDINGS  Left Ventricle: Left ventricular ejection fraction, by estimation, is 55 to 60%. The left ventricle has normal function. The left ventricle has no regional Lenger motion abnormalities. The left ventricular internal cavity size was normal in size. There is  mild asymmetric left ventricular hypertrophy of the septal segment. Left ventricular diastolic parameters are indeterminate. Right Ventricle: The right ventricular size is normal. No increase in right ventricular Foulks thickness. Right ventricular systolic function is low normal. Tricuspid regurgitation signal is inadequate for assessing PA pressure. Left Atrium: Left atrial size was normal in size. Right Atrium: Right atrial size was normal in size. Pericardium: There is no evidence of pericardial effusion. Mitral Valve: The mitral valve is grossly normal. Mild mitral annular calcification. Trivial mitral valve regurgitation. Tricuspid Valve: The tricuspid valve is grossly normal.  Tricuspid valve regurgitation is trivial. Aortic Valve: The aortic valve is tricuspid. Aortic  valve regurgitation is not visualized. Pulmonic Valve: The pulmonic valve was grossly normal. Pulmonic valve regurgitation is trivial. Aorta: The aortic root is normal in size and structure. Venous: The inferior vena cava is normal in size with greater than 50% respiratory variability, suggesting right atrial pressure of 3 mmHg. IAS/Shunts: The interatrial septum appears to be lipomatous. No atrial level shunt detected by color flow Doppler.  LEFT VENTRICLE PLAX 2D LVIDd:         4.00 cm LVIDs:         2.70 cm LV PW:         1.00 cm LV IVS:        1.20 cm LVOT diam:     1.70 cm LVOT Area:     2.27 cm  RIGHT VENTRICLE             IVC RV S prime:     11.60 cm/s  IVC diam: 1.60 cm TAPSE (M-mode): 1.0 cm LEFT ATRIUM             Index       RIGHT ATRIUM           Index LA diam:        3.70 cm 1.96 cm/m  RA Area:     15.00 cm LA Vol (A2C):   43.0 ml 22.77 ml/m RA Volume:   36.40 ml  19.28 ml/m LA Vol (A4C):   59.1 ml 31.30 ml/m LA Biplane Vol: 54.8 ml 29.02 ml/m   AORTA Ao Root diam: 3.40 cm Ao Asc diam:  3.40 cm  SHUNTS Systemic Diam: 1.70 cm Nona Dell MD Electronically signed by Nona Dell MD Signature Date/Time: 01/30/2021/1:09:49 PM    Final    Scheduled Meds: . aspirin  81 mg Oral Daily  . atorvastatin  40 mg Oral QHS  . donepezil  10 mg Oral QHS  . enoxaparin (LOVENOX) injection  40 mg Subcutaneous Q24H  . lisinopril  20 mg Oral Daily  . metoprolol tartrate  25 mg Oral BID  . QUEtiapine  12.5 mg Oral Once   Continuous Infusions: . lactated ringers 75 mL/hr at 01/30/21 1805     LOS: 1 day   Time spent:  Azucena Fallen, DO [facility]  If 7PM-7AM, please contact night-coverage [email or web address]  02/01/2021, 7:13 AM

## 2021-02-01 NOTE — Evaluation (Signed)
Occupational Therapy Evaluation Patient Details Name: Adam Rowland MRN: [id number] DOB: 1945-10-18 Today's Date: 02/01/2021    History of Present Illness Pt is a 75 y.o. M who presents with acute metabolic encephalopathy. CT, UDS, MRI unremarkable. EEG negative for seizures. Significant PMH: dementia, CAD, HTN, HLD.   Clinical Impression   PTA, pt lives with wife and ambulatory without AD. Pt Independent with ADLs, completes yard work and cares for farm animals. Pt presents now with deficits in cognition (A&OX1), balance, endurance and strength. Guided pt in bed mobility at Min A and stand pivots to/from [facility] at Mod A using RW. Pt requires hands on assist throughout to maintain balance due to posterior lean. Pt requires Mod A for UB ADLs and Max A for LB ADLs due to deficits. Pt's wife present throughout session and engaged in conversation on current functional abilities. Pt's wife hopes pt will improve enough to return home, but understands she is unable to provide the physical assist he currently needs. Discussed DME needs if pt to DC home. Recommend SNF for short term rehab as pt is significantly below baseline. Will progress sequencing and standing balance with ADLs in future sessions.     Follow Up Recommendations  SNF;Supervision/Assistance - 24 hour    Equipment Recommendations  3 in 1 bedside commode;Tub/shower bench;Other (comment);Wheelchair (measurements OT);Wheelchair cushion (measurements OT) (Rolling walker)    Recommendations for Other Services       Precautions / Restrictions Precautions Precautions: Fall Restrictions Weight Bearing Restrictions: No      Mobility Bed Mobility Overal bed mobility: Needs Assistance Bed Mobility: Supine to Sit;Sit to Supine     Supine to sit: Min assist;HOB elevated Sit to supine: Min assist   General bed mobility comments: Min A to advance trunk and Min A to get B LE back into bed fully    Transfers Overall transfer level: Needs  assistance Equipment used: Rolling walker (2 wheeled) Transfers: Sit to/from [employer] Sit to Stand: Min assist Stand pivot transfers: Mod assist       General transfer comment: Min A to stand from bedside, difficulty following directions for hand placement. Varied from Min A to Mod A for pivot to [facility] and back to bed with hands on assist needed to maintain posterior bias    Balance Overall balance assessment: Needs assistance Sitting-balance support: Feet supported Sitting balance-Leahy Scale: Fair     Standing balance support: Bilateral upper extremity supported;During functional activity Standing balance-Leahy Scale: Poor Standing balance comment: reliant on external support                           ADL either performed or assessed with clinical judgement   ADL Overall ADL's : Needs assistance/impaired Eating/Feeding: Minimal assistance;Sitting   Grooming: Minimal assistance;Sitting Grooming Details (indicate cue type and reason): wife reports she assisted pt in brushing teeth this AM Upper Body Bathing: Moderate assistance;Sitting   Lower Body Bathing: Maximal assistance;Sit to/from stand   Upper Body Dressing : Moderate assistance;Sitting   Lower Body Dressing: Maximal assistance;Sit to/from stand   Toilet Transfer: Moderate assistance;Stand-pivot;BSC;RW Toilet Transfer Details (indicate cue type and reason): Assist to power up and maintain balance due to constant posterior lean, cues for sequencing and advancement of RW Toileting- Clothing Manipulation and Hygiene: Maximal assistance;Sit to/from stand Toileting - Clothing Manipulation Details (indicate cue type and reason): Max A for peri care after BM attempt with assist needed to maintain balance  General ADL Comments: Pt with cognitive deficits below pt's baseline. Also with posterior bias in standing increasing fall risk     Vision Baseline Vision/History: Wears glasses Wears  Glasses: At all times Patient Visual Report: No change from baseline Vision Assessment?: No apparent visual deficits     Perception     Praxis      Pertinent Vitals/Pain Pain Assessment: No/denies pain     Hand Dominance Right   Extremity/Trunk Assessment Upper Extremity Assessment Upper Extremity Assessment: Generalized weakness   Lower Extremity Assessment Lower Extremity Assessment: Defer to PT evaluation   Cervical / Trunk Assessment Cervical / Trunk Assessment: Kyphotic   Communication Communication Communication: No difficulties   Cognition Arousal/Alertness: Awake/alert;Lethargic Behavior During Therapy: Flat affect Overall Cognitive Status: Impaired/Different from baseline Area of Impairment: Orientation;Attention;Memory;Safety/judgement;Awareness;Problem solving                 Orientation Level: Disoriented to;Place;Time;Situation Current Attention Level: Sustained Memory: Decreased short-term memory   Safety/Judgement: Decreased awareness of safety;Decreased awareness of deficits Awareness: Intellectual Problem Solving: Requires verbal cues;Difficulty sequencing;Slow processing General Comments: Pt able to state his name and birthday with increased time. Unable to state month or year despite redirection to use of calendar in room. Able to follow 1 step commands, requires redirection for sustaining attention intermittently. benefits from multimodal cues for activities.   General Comments  VSS on RA. Wife present during session, engaged in conversation about current functional abilities, DME needs if planning to go home and benefits of rehab prior to returning home    Exercises     Shoulder Instructions      Home Living Family/patient expects to be discharged to:: Private residence Living Arrangements: Spouse/significant other Available Help at Discharge: Family;Available 24 hours/day Type of Home: House Home Access: Stairs to enter [employer] of Steps: 3 Entrance Stairs-Rails: Left;Right Home Layout: Two level;Able to live on main level with bedroom/bathroom Alternate Level Stairs-Number of Steps: 13 Alternate Level Stairs-Rails: Left Bathroom Shower/Tub: Chief Strategy Officer: Standard Bathroom Accessibility: No   Home Equipment: None          Prior Functioning/Environment Level of Independence: Independent  Gait / Transfers Assistance Needed: independent, pt wife reports slower gait speed but able to ambulate community distances ADL's / Homemaking Assistance Needed: Independent for ADLs, able to [employer] and care for farm animals            OT Problem List: Decreased strength;Decreased activity tolerance;Impaired balance (sitting and/or standing);Decreased coordination;Decreased cognition;Decreased safety awareness;Decreased knowledge of use of DME or AE      OT Treatment/Interventions: Self-care/ADL training;Therapeutic exercise;DME and/or AE instruction;Therapeutic activities;Patient/family education;Balance training;Cognitive remediation/compensation    OT Goals(Current goals can be found in the care plan section) Acute Rehab OT Goals Patient Stated Goal: pt wife would like for his mentation to improve and get stronger OT Goal Formulation: With patient/family Time For Goal Achievement: 02/15/21 Potential to Achieve Goals: Good ADL Goals Pt Will Perform Grooming: with supervision;standing Pt Will Perform Upper Body Dressing: with supervision;sitting Pt Will Perform Lower Body Dressing: with min assist;sit to/from stand Pt Will Transfer to Toilet: with supervision;ambulating Pt Will Perform Toileting - Clothing Manipulation and hygiene: with min assist;sit to/from stand;sitting/lateral leans  OT Frequency: Min 2X/week   Barriers to D/C:            Co-evaluation              AM-PAC OT "6 Clicks" Daily Activity     Outcome  Measure Help from another person eating meals?: A  Little Help from another person taking care of personal grooming?: A Little Help from another person toileting, which includes using toliet, bedpan, or urinal?: A Lot Help from another person bathing (including washing, rinsing, drying)?: A Lot Help from another person to put on and taking off regular upper body clothing?: A Lot Help from another person to put on and taking off regular lower body clothing?: A Lot 6 Click Score: 14   End of Session Equipment Utilized During Treatment: Gait belt;Rolling walker Nurse Communication: Mobility status;Other (comment) (family request for constipation meds)  Activity Tolerance: Patient tolerated treatment well Patient left: in bed;with call bell/phone within reach;with bed alarm set;with family/visitor present  OT Visit Diagnosis: Unsteadiness on feet (R26.81);Other abnormalities of gait and mobility (R26.89);Muscle weakness (generalized) (M62.81);Other symptoms and signs involving cognitive function                Time: 7544-9201 OT Time Calculation (min): 46 min Charges:  OT General Charges $OT Visit: 1 Visit OT Evaluation $OT Eval Moderate Complexity: 1 Mod OT Treatments $Self Care/Home Management : 8-22 mins $Therapeutic Activity: 8-22 mins  Bradd Canary, OTR/L Acute Rehab Services Office: [phone]  Lorre Munroe 02/01/2021, 11:39 AM

## 2021-02-01 NOTE — TOC Progression Note (Signed)
Transition of Care [facility]) - Progression Note    Patient Details  Name: TYHEIM VANALSTYNE MRN: [id number] Date of Birth: Sep 08, 1945  [facility] CM/SW Contact  Eduard Roux, [place] Phone Number: 02/01/2021, 2:38 PM  Clinical Narrative:     CSW was informed by OT, patient's wife has decided on short term rehab at [facility].  12:32pm CSW visit the patient and wife at bedside. Patient was sleep. Patient's wife confirmed change in disposition to short term rehab at [facility]. She acknowledges she probably could not help him physically at home. CSW explained the SNF process. [facility]  and [facility] are the preferred SNF choices.   2:40pm-CSW contacted [facility]- they will review -waiting on decision [facility] has declined   CSW will provide bed offers once available  CSW will continue to follow and assist with discharge planning.   Expected Discharge Plan: Home w Home Health Services Barriers to Discharge: Continued Medical Work up  Expected Discharge Plan and Services Expected Discharge Plan: Home w Home Health Services In-house Referral: Clinical Social Work                                             Social Determinants of Health (SDOH) Interventions    Readmission Risk Interventions No flowsheet data found.

## 2021-02-01 NOTE — NC FL2 (Signed)
[place] MEDICAID FL2 LEVEL OF CARE SCREENING TOOL     IDENTIFICATION  Patient Name: Adam Rowland Birthdate: Jul 30, 1946 Sex: male Admission Date (Current Location): 01/29/2021  [facility] and [place] Number:  Producer, television/film/video and Address:  [facility], [address] [place], [place] [address]      Provider Number: [id number]  Attending Physician Name and Address:  Azucena Fallen, MD  Relative Name and Phone Number:       Current Level of Care: Hospital Recommended Level of Care: Skilled Nursing Facility Prior Approval Number:    Date Approved/Denied:   PASRR Number: [id number]  Discharge Plan: SNF    Current Diagnoses: Patient Active Problem List   Diagnosis Date Noted  . Acute metabolic encephalopathy 01/31/2021  . AF (paroxysmal atrial fibrillation) (HCC) 01/30/2021  . AMS (altered mental status) 01/29/2021  . Acute prostatitis 01/28/2021  . Dementia (HCC) 06/06/2018  . Metabolic syndrome 05/27/2015  . BMI 27.0-27.9,adult 05/21/2015  . CAD (coronary artery disease) 06/21/2014  . Cardiomyopathy, ischemic 06/21/2014  . NSTEMI (non-ST elevated myocardial infarction) (HCC) 06/19/2014  . Hypertension 01/16/2014  . Hyperlipidemia 01/16/2013    Orientation RESPIRATION BLADDER Height & Weight        Normal External catheter,Incontinent Weight: 161 lb 13.1 oz (73.4 kg) Height:  5\' 9"  (175.3 cm)  BEHAVIORAL SYMPTOMS/MOOD NEUROLOGICAL BOWEL NUTRITION STATUS      Continent Diet (please see discharge summary)  AMBULATORY STATUS COMMUNICATION OF NEEDS Skin   Limited Assist Verbally Normal                       Personal Care Assistance Level of Assistance  Bathing,Feeding,Dressing Bathing Assistance: Limited assistance Feeding assistance: Limited assistance Dressing Assistance: Limited assistance     Functional Limitations Info  Sight,Hearing,Speech Sight Info: Adequate Hearing Info: Adequate Speech Info: Adequate     SPECIAL CARE FACTORS FREQUENCY  PT (By licensed PT),OT (By licensed OT)     PT Frequency: 5x per week OT Frequency: 5x per week            Contractures Contractures Info: Not present    Additional Factors Info  Code Status,Allergies Code Status Info: FULL Allergies Info: NKA           Current Medications (02/01/2021):  This is the current hospital active medication list Current Facility-Administered Medications  Medication Dose Route Frequency Provider Last Rate Last Admin  . acetaminophen (TYLENOL) tablet 650 mg  650 mg Oral Q6H PRN 04/03/2021, MD       Or  . acetaminophen (TYLENOL) suppository 650 mg  650 mg Rectal Q6H PRN Erick Blinks, MD      . aspirin chewable tablet 81 mg  81 mg Oral Daily Erick Blinks, MD   81 mg at 02/01/21 0956  . atorvastatin (LIPITOR) tablet 40 mg  40 mg Oral QHS 04/03/21, MD   40 mg at 02/01/21 0004  . donepezil (ARICEPT) tablet 10 mg  10 mg Oral QHS 04/03/21, MD   10 mg at 01/31/21 2354  . enoxaparin (LOVENOX) injection 40 mg  40 mg Subcutaneous Q24H 04/02/21, MD   40 mg at 01/31/21 1857  . lactated ringers infusion   Intravenous Continuous 04/02/21, MD 75 mL/hr at 01/30/21 1805 New Bag at 01/30/21 1805  . lisinopril (ZESTRIL) tablet 20 mg  20 mg Oral Daily 04/01/21, MD   20 mg at 02/01/21 0956  . metoprolol tartrate (LOPRESSOR) tablet  25 mg  25 mg Oral BID Erick Blinks, MD   25 mg at 02/01/21 0956  . ondansetron (ZOFRAN) tablet 4 mg  4 mg Oral Q6H PRN Erick Blinks, MD       Or  . ondansetron (ZOFRAN) injection 4 mg  4 mg Intravenous Q6H PRN Erick Blinks, MD      . QUEtiapine (SEROQUEL) tablet 12.5 mg  12.5 mg Oral Once Bhagat, Srishti L, MD      . QUEtiapine (SEROQUEL) tablet 50 mg  50 mg Oral QHS PRN Azucena Fallen, MD   50 mg at 01/31/21 2354     Discharge Medications: Please see discharge summary for a list of discharge medications.  Relevant Imaging Results:  Relevant  Lab Results:   Additional Information Pfizer COVID-19 Vaccine 10/09/2019 , 09/18/2019  patient states he  has received booster shot  Eduard Roux, LCSW

## 2021-02-02 LAB — BASIC METABOLIC PANEL
Anion gap: 10 (ref 5–15)
BUN: 19 mg/dL (ref 8–23)
CO2: 24 mmol/L (ref 22–32)
Calcium: 8.6 mg/dL — ABNORMAL LOW (ref 8.9–10.3)
Chloride: 103 mmol/L (ref 98–111)
Creatinine, Ser: 0.85 mg/dL (ref 0.61–1.24)
GFR, Estimated: >60 mL/min (ref >60)
Glucose, Bld: 108 mg/dL — ABNORMAL HIGH (ref 70–99)
Potassium: 4 mmol/L (ref 3.5–5.1)
Sodium: 137 mmol/L (ref 135–145)

## 2021-02-02 LAB — CBC
HCT: 41.8 % (ref 39.0–52.0)
Hemoglobin: 14.4 g/dL (ref 13.0–17.0)
MCH: 33.6 pg (ref 26.0–34.0)
MCHC: 34.4 g/dL (ref 30.0–36.0)
MCV: 97.4 fL (ref 80.0–100.0)
Platelets: 195 10*3/uL (ref 150–400)
RBC: 4.29 MIL/uL (ref 4.22–5.81)
RDW: 12 % (ref 11.5–15.5)
WBC: 8.1 10*3/uL (ref 4.0–10.5)
nRBC: 0 % (ref 0.0–0.2)

## 2021-02-02 MED ORDER — MAGNESIUM CITRATE PO SOLN
1.0000 | Freq: Once | ORAL | Status: AC
  Administered 2021-02-02: 1 via ORAL
  Filled 2021-02-02: qty 296

## 2021-02-02 NOTE — Progress Notes (Signed)
PROGRESS NOTE    Adam Rowland  EUM:[id number] DOB: 11/29/1945 DOA: 01/29/2021 PCP: Bennie Pierini, FNP   Brief Narrative:  Adam Rowland is a 75 y.o. male with medical history significant of dementia, coronary artery disease, hypertension, hyperlipidemia, who lives at home with his wife.  Patient was in his usual state of health the night prior to admission, he woke up in the morning confused unable to carry on a meaningful conversation with slurred speech.  Notable ambulation but no clear explanation of possible deficits but was extremely high fall risk which is unusual for him.  Patient ultimately brought to the ED for further evaluation, admitted for acute CVA work-up and neurology was consulted.  Of note no changes in patient's lifestyle food no recent travels only new medication was Flomax initiated 48 hours prior to admission.  Assessment & Plan:   Active Problems:   Hyperlipidemia   Hypertension   CAD (coronary artery disease)   Dementia (HCC)   AMS (altered mental status)   AF (paroxysmal atrial fibrillation) (HCC)   Acute metabolic encephalopathy   Acute metabolic encephalopathy, likely secondary to sundowning/hospital delirium, ongoing, somewhat worsening over the past 24 hours -Neurology previously signed off given no acute findings -Patient's imaging including CT head and MRI are unremarkable  -UDS unremarkable, no history of substance abuse or issues with alcohol per family or patient -currently holding Flomax and memantine per neurology recommendations -No signs or symptoms of infectious process -if febrile or leukocytosis over the next 24 hours would reculture and reevaluate for infectious process -Mental status previously improving within 24 hours of admission, thought to be secondary to home medications of Flomax versus memantine -Unfortunately patient had worsening mental status and hallucinations after initial improvement concerning for in the hospital delirium  -improving somewhat with Seroquel over the past 48 hours but not yet back to baseline  Provoked afib/flutter, provoked versus paroxysmal, rate controlled -Patient's wife states he has had diagnosis of A. fib in the past but is not currently on anticoagulation for unclear reasoning (potentially low afib burden or bleeding risk?) -Patient converted to sinus rhythm overnight on the fifth, now back in A. fib but rate controlled on the seventh -If patient continues to have bradycardia while in sinus at borderline tachycardia while in A. fib he may benefit from cardiology evaluation for sick sinus syndrome or similar -unclear if this is provoked due to acute delirium as above or his baseline  Hypertension  -Continue metoprolol and lisinopril; transient bradycardia 48 hours ago while in sinus rhythm now resolved back in A. fib rate around 100-110  Hyperlipidemia -Continue on statin  Dementia, unspecified subtype -Continue on home dose of Aricept and Namenda  Hx of Coronary artery disease - No complaints of chest pain at this time - Continue aspirin, beta-blocker and statin  DVT prophylaxis: Lovenox Code Status: Full code Family Communication: At bedside  Status is: Inpatient  Dispo: The patient is from: Home              Anticipated d/c is to: Home              Anticipated d/c date is: 24 to 48 hours              Patient currently not medically stable for discharge  Consultants:   Neurology  Procedures:   None  Antimicrobials:  None indicated  Subjective: No acute issues or events overnight, patient slept quite well last night for the first time since admission, mental  status appears to be improving as well today per wife at bedside but not yet back to baseline.  Objective: Vitals:   02/01/21 1936 02/01/21 2119 02/02/21 0003 02/02/21 0327  BP: (!) 133/58  116/67 (!) 97/57  Pulse: 60 67 65 70  Resp: 18  20 16   Temp: 97.8 F (36.6 C)  97.8 F (36.6 C) 97.8 F (36.6 C)   TempSrc: Oral  Oral Oral  SpO2: 97%  97% 97%  Weight:    76.6 kg  Height:        Intake/Output Summary (Last 24 hours) at 02/02/2021 04/04/2021 Last data filed at 02/02/2021 0328 Gross per 24 hour  Intake --  Output 800 ml  Net -800 ml   Filed Weights   01/29/21 2215 02/02/21 0327  Weight: 73.4 kg 76.6 kg    Examination:  General exam: No acute distress resting comfortably eating breakfast on his own Respiratory system: Clear to auscultation. Respiratory effort normal. Cardiovascular system: S1 & S2 heard, RRR. No JVD, murmurs, rubs, gallops or clicks. No pedal edema. Gastrointestinal system: Abdomen is nondistended, soft and nontender. No organomegaly or masses felt. Normal bowel sounds heard. Central nervous system: More appropriate conversation and answers today but question and answer are still quite delayed from his baseline per wife Extremities: Symmetric 5 x 5 power; without overt deficit. Skin: No rashes, lesions or ulcers  Data Reviewed: I have personally reviewed following labs and imaging studies  CBC: Recent Labs  Lab 01/28/21 0923 01/29/21 0859 01/29/21 1705 01/30/21 0027 02/01/21 0258 02/02/21 0213  WBC 6.2 8.8 6.7 7.1 8.5 8.1  NEUTROABS 4.2 7.3  --   --   --   --   HGB 15.8 15.0 15.9 14.4 13.9 14.4  HCT 45.0 44.2 47.3 41.2 40.9 41.8  MCV 96 100.2* 101.1* 96.7 98.1 97.4  PLT 212 164 144* 142* 178 195   Basic Metabolic Panel: Recent Labs  Lab 01/28/21 0923 01/29/21 0859 01/29/21 1705 01/30/21 0027 02/01/21 0258 02/02/21 0213  NA 141 136  --  136 138 137  K 5.0 4.2  --  3.8 3.8 4.0  CL 103 103  --  105 106 103  CO2 25 25  --  23 25 24   GLUCOSE 132* 130*  --  150* 108* 108*  BUN 15 19  --  17 13 19   CREATININE 0.97 0.84 0.79 1.05 0.99 0.85  CALCIUM 9.3 8.6*  --  8.4* 8.5* 8.6*  MG  --   --   --  1.9  --   --    GFR: Estimated Creatinine Clearance: 76.2 mL/min (by C-G formula based on SCr of 0.85 mg/dL). Liver Function Tests: Recent Labs  Lab  01/28/21 0923 01/29/21 0859 01/30/21 0027  AST 17 19 19   ALT 17 18 19   ALKPHOS 59 50 43  BILITOT 0.8 1.2 0.9  PROT 6.5 6.6 5.4*  ALBUMIN 4.4 3.9 3.1*   No results for input(s): LIPASE, AMYLASE in the last 168 hours. Recent Labs  Lab 01/29/21 1705  AMMONIA 13   Coagulation Profile: Recent Labs  Lab 01/29/21 0859  INR 1.1   Cardiac Enzymes: No results for input(s): CKTOTAL, CKMB, CKMBINDEX, TROPONINI in the last 168 hours. BNP (last 3 results) No results for input(s): PROBNP in the last 8760 hours. HbA1C: No results for input(s): HGBA1C in the last 72 hours. CBG: Recent Labs  Lab 01/29/21 0843 01/29/21 2105  GLUCAP 126* 103*   Lipid Profile: No results for input(s): CHOL, HDL,  LDLCALC, TRIG, CHOLHDL, LDLDIRECT in the last 72 hours. Thyroid Function Tests: No results for input(s): TSH, T4TOTAL, FREET4, T3FREE, THYROIDAB in the last 72 hours. Anemia Panel: No results for input(s): VITAMINB12, FOLATE, FERRITIN, TIBC, IRON, RETICCTPCT in the last 72 hours. Sepsis Labs: No results for input(s): PROCALCITON, LATICACIDVEN in the last 168 hours.  Recent Results (from the past 240 hour(s))  Microscopic Examination     Status: None   Collection Time: 01/28/21  8:36 AM   Urine  Result Value Ref Range Status   WBC, UA None seen 0 - 5 /hpf Final   RBC None seen 0 - 2 /hpf Final   Epithelial Cells (non renal) None seen 0 - 10 /hpf Final   Bacteria, UA None seen None seen/Few Final  Resp Panel by RT-PCR (Flu A&B, Covid) Nasopharyngeal Swab     Status: None   Collection Time: 01/29/21  8:45 AM   Specimen: Nasopharyngeal Swab; Nasopharyngeal(NP) swabs in vial transport medium  Result Value Ref Range Status   SARS Coronavirus 2 by RT PCR NEGATIVE NEGATIVE Final    Comment: (NOTE) SARS-CoV-2 target nucleic acids are NOT DETECTED.  The SARS-CoV-2 RNA is generally detectable in upper respiratory specimens during the acute phase of infection. The lowest concentration of  SARS-CoV-2 viral copies this assay can detect is 138 copies/mL. A negative result does not preclude SARS-Cov-2 infection and should not be used as the sole basis for treatment or other patient management decisions. A negative result may occur with  improper specimen collection/handling, submission of specimen other than nasopharyngeal swab, presence of viral mutation(s) within the areas targeted by this assay, and inadequate number of viral copies(<138 copies/mL). A negative result must be combined with clinical observations, patient history, and epidemiological information. The expected result is Negative.  Fact Sheet for Patients:  BloggerCourse.com  Fact Sheet for Healthcare Providers:  SeriousBroker.it  This test is no t yet approved or cleared by the [place] FDA and  has been authorized for detection and/or diagnosis of SARS-CoV-2 by FDA under an Emergency Use Authorization (EUA). This EUA will remain  in effect (meaning this test can be used) for the duration of the COVID-19 declaration under Section 564(b)(1) of the Act, 21 U.S.C.section 360bbb-3(b)(1), unless the authorization is terminated  or revoked sooner.       Influenza A by PCR NEGATIVE NEGATIVE Final   Influenza B by PCR NEGATIVE NEGATIVE Final    Comment: (NOTE) The Xpert Xpress SARS-CoV-2/FLU/RSV plus assay is intended as an aid in the diagnosis of influenza from Nasopharyngeal swab specimens and should not be used as a sole basis for treatment. Nasal washings and aspirates are unacceptable for Xpert Xpress SARS-CoV-2/FLU/RSV testing.  Fact Sheet for Patients: BloggerCourse.com  Fact Sheet for Healthcare Providers: SeriousBroker.it  This test is not yet approved or cleared by the [place] FDA and has been authorized for detection and/or diagnosis of SARS-CoV-2 by FDA under an Emergency Use  Authorization (EUA). This EUA will remain in effect (meaning this test can be used) for the duration of the COVID-19 declaration under Section 564(b)(1) of the Act, 21 U.S.C. section 360bbb-3(b)(1), unless the authorization is terminated or revoked.  Performed at [facility], [address] [place], [place] [address]     Radiology Studies: EEG adult  Result Date: 02-01-21 Adam Quest, MD     02/01/21  1:59 PM Patient Name: MARNELL PARRILL MRN: [id number] Epilepsy Attending: Charlsie Rowland Referring Physician/Provider: Dr Caryl Pina Date: 01-Feb-2021  Duration: 22.26 mins Patient history: 75yo M with ams. EEG to evaluate for seizure Level of alertness: Awake AEDs during EEG study: None Technical aspects: This EEG study was done with scalp electrodes positioned according to the 10-20 International system of electrode placement. Electrical activity was acquired at a sampling rate of 500Hz  and reviewed with a high frequency filter of 70Hz  and a low frequency filter of 1Hz . EEG data were recorded continuously and digitally stored. Description: The posterior dominant rhythm consists of 7 Hz activity of moderate voltage (25-35 uV) seen predominantly in posterior head regions, symmetric and reactive to eye opening and eye closing. EEG showed continuous generalized and maximal left temporal 3 to 6 Hz theta-delta slowing.  Hyperventilation and photic stimulation were not performed.   ABNORMALITY - Continuous slow, generalized and maximal left temporal IMPRESSION: This study is suggestive of non-specific cortical dysfunction arising from left temporal region. Additionally there is moderate diffuse encephalopathy, nonspecific etiology. No seizures or epileptiform discharges were seen throughout the recording. Adam   Scheduled Meds: . aspirin  81 mg Oral Daily  . atorvastatin  40 mg Oral QHS  . donepezil  10 mg Oral QHS  . enoxaparin (LOVENOX) injection  40 mg Subcutaneous Q24H  . lisinopril  20  mg Oral Daily  . metoprolol tartrate  25 mg Oral BID  . polyethylene glycol  17 g Oral BID  . QUEtiapine  12.5 mg Oral Once  . senna-docusate  1 tablet Oral BID   Continuous Infusions: . lactated ringers 75 mL/hr at 01/30/21 1805     LOS: 2 days   Time spent:  Annabelle Harman, DO [facility]  If 7PM-7AM, please contact night-coverage [email or web address]  02/02/2021, 7:22 AM

## 2021-02-02 NOTE — Progress Notes (Signed)
Physical Therapy Treatment Patient Details Name: Adam Rowland MRN: [id number] DOB: 1945/09/10 Today's Date: 02/02/2021    History of Present Illness Pt is a 75 y.o. M who presents with acute metabolic encephalopathy. CT, UDS, MRI unremarkable. EEG negative for seizures. Significant PMH: dementia, CAD, HTN, HLD.    PT Comments    Pt received in supine, A&O x1 and participatory as able, with good tolerance for bed mobility and transfer training. Pt limited due to bowel incontinence and needing min to modA for functional mobility tasks/to get to [facility] and chair this date. Reviewed supine/seated LE HEP exercises pt would benefit from HEP handout next date to reinforce (spouse receptive to instruction). Pt continues to benefit from PT services to progress toward functional mobility goals. Continue to recommend SNF.   Follow Up Recommendations  SNF;Supervision/Assistance - 24 hour     Equipment Recommendations  Rolling walker with 5" wheels;3in1 (PT)    Recommendations for Other Services       Precautions / Restrictions Precautions Precautions: Fall Restrictions Weight Bearing Restrictions: No    Mobility  Bed Mobility Overal bed mobility: Needs Assistance Bed Mobility: Supine to Sit     Supine to sit: Min assist;HOB elevated;+2 for safety/equipment     General bed mobility comments: Min A to advance trunk and scoot to EOB, multimodal cues for hand placement/self-assist.    Transfers Overall transfer level: Needs assistance Equipment used: Rolling walker (2 wheeled) Transfers: Sit to/from [employer] Sit to Stand: Min assist;+2 safety/equipment Stand pivot transfers: Mod assist;From elevated surface       General transfer comment: Min A to stand from bedside, difficulty following directions for hand placement. Varied from Min A to Mod A for pivot to [facility] and back to chair with hands on assist needed. +2 present for safety but not needing more than +1 physical  assist.  Ambulation/Gait             General Gait Details: pt limited due to fatigue and fecal incontinence (per spouse he had a suppository medication earlier in day) so unable to assess hallway ambulation.   Stairs             Wheelchair Mobility    Modified Rankin (Stroke Patients Only)       Balance Overall balance assessment: Needs assistance Sitting-balance support: Feet supported Sitting balance-Leahy Scale: Fair Sitting balance - Comments: static sitting and weight shifting EOB without LOB   Standing balance support: Bilateral upper extremity supported;During functional activity Standing balance-Leahy Scale: Poor Standing balance comment: reliant on external support and RW                            Cognition Arousal/Alertness: Awake/alert;Lethargic Behavior During Therapy: Flat affect Overall Cognitive Status: Impaired/Different from baseline Area of Impairment: Orientation;Attention;Memory;Safety/judgement;Awareness;Problem solving                 Orientation Level: Disoriented to;Place;Time;Situation (pt able to state his name but unable to state his wife's name or his relation to her. he states "my girlfriend" when asked.) Current Attention Level: Sustained Memory: Decreased short-term memory   Safety/Judgement: Decreased awareness of safety;Decreased awareness of deficits Awareness: Intellectual Problem Solving: Requires verbal cues;Difficulty sequencing;Slow processing;Requires tactile cues General Comments: Pt able to state his name with increased time. Unable to state spouse's name. Able to follow 1 step commands, requires redirection for sustaining attention intermittently. He benefits from multimodal cues for activities and forgets that he  is seated on BSC, asking to get to bathroom. Noted to be incontinent of bowels when encouraged to sit/stand up.      Exercises Low Level/ICU Exercises Ankle Circles/Pumps: AROM;Both;10  reps;Supine Hip ABduction/ADduction: AAROM;Both;10 reps;Supine Heel Slides: AAROM;Both;10 reps;Supine Other Exercises Other Exercises: seated BLE A/AAROM: LAQ, marching x10 reps ea Other Exercises: STS x 2 reps    General Comments General comments (skin integrity, edema, etc.): VSS on RA, spouse present and receptive to instruction, reviewed supine/seated LE exercises encouraged her to review with him during day. Per spouse Adam Rowland, she cannot physically assist to lift him. Discussed benefits of rehab prior to returning home.      Pertinent Vitals/Pain Pain Assessment: No/denies pain    Home Living                      Prior Function            PT Goals (current goals can now be found in the care plan section) Acute Rehab PT Goals Patient Stated Goal: pt wife would like for his mentation to improve and get stronger PT Goal Formulation: With patient/family Time For Goal Achievement: 02/14/21 Potential to Achieve Goals: Good Progress towards PT goals: Progressing toward goals (slow progress)    Frequency    Min 3X/week      PT Plan Current plan remains appropriate       AM-PAC PT "6 Clicks" Mobility   Outcome Measure  Help needed turning from your back to your side while in a flat bed without using bedrails?: A Little Help needed moving from lying on your back to sitting on the side of a flat bed without using bedrails?: A Little Help needed moving to and from a bed to a chair (including a wheelchair)?: A Little Help needed standing up from a chair using your arms (e.g., wheelchair or bedside chair)?: A Little Help needed to walk in hospital room?: A Lot Help needed climbing 3-5 steps with a railing? : A Lot 6 Click Score: 16    End of Session Equipment Utilized During Treatment: Gait belt Activity Tolerance: Patient tolerated treatment well;Other (comment) (incontinent, needing clean-up) Patient left: in chair;with call bell/phone within reach;with chair  alarm set;with family/visitor present;with nursing/sitter in room;Other (comment) (NT in room notified he has chair alarm pad under him but it needs to be activated/plugged in to [place] alarm, in care of NT and spouse also present; heels floated) Nurse Communication: Mobility status PT Visit Diagnosis: Unsteadiness on feet (R26.81);Difficulty in walking, not elsewhere classified (R26.2)     Time: 4098-1191 PT Time Calculation (min) (ACUTE ONLY): 23 min  Charges:  $Therapeutic Exercise: 8-22 mins $Therapeutic Activity: 8-22 mins                     Nadya Hopwood P., PTA Acute Rehabilitation Services Pager: [phone] Office: [phone]   Angus Palms 02/02/2021, 4:44 PM

## 2021-02-02 NOTE — TOC Progression Note (Signed)
Transition of Care [facility]) - Progression Note    Patient Details  Name: SAULO ANTHIS MRN: [id number] Date of Birth: [birth year]  [facility]) CM/SW Contact  Eduard Roux, [place] Phone Number: 02/02/2021, 10:26 AM  Clinical Narrative:     CSW spoke with patient's wife by phone- informed [facility] was unable to accept- informed of bed offers in [place]. She states preference closer to their home- CSW advised will contacted [facility] and request to review his clinicals.  CSW spoke with Hillard Danker- SNF will review and call CSW back.   Antony Blackbird, MSW, LCSW Clinical Social Worker   Expected Discharge Plan: Home w Home Health Services Barriers to Discharge: Continued Medical Work up  Expected Discharge Plan and Services Expected Discharge Plan: Home w Home Health Services In-house Referral: Clinical Social Work                                             Social Determinants of Health (SDOH) Interventions    Readmission Risk Interventions No flowsheet data found.

## 2021-02-02 NOTE — TOC Progression Note (Signed)
Transition of Care [facility]) - Progression Note    Patient Details  Name: Adam Rowland MRN: [id number] Date of Birth: 09-20-1945  Transition of Care [facility]) CM/SW Contact  Eduard Roux, [place] Phone Number: 02/02/2021, 4:00 PM  Clinical Narrative:     [facility]/SNF- no bed availability  Sent referral to [facility] and updated patient's spouse and provide of additional bed offers Insurance auth started reference # [id number]  CSW will continue to follow and assist with discharge planning.   Antony Blackbird, MSW, LCSW Clinical Social Worker   Expected Discharge Plan: Home w Home Health Services Barriers to Discharge: Continued Medical Work up  Expected Discharge Plan and Services Expected Discharge Plan: Home w Home Health Services In-house Referral: Clinical Social Work                                             Social Determinants of Health (SDOH) Interventions    Readmission Risk Interventions No flowsheet data found.

## 2021-02-03 LAB — BASIC METABOLIC PANEL
Anion gap: 11 (ref 5–15)
BUN: 20 mg/dL (ref 8–23)
CO2: 24 mmol/L (ref 22–32)
Calcium: 8.8 mg/dL — ABNORMAL LOW (ref 8.9–10.3)
Chloride: 101 mmol/L (ref 98–111)
Creatinine, Ser: 0.95 mg/dL (ref 0.61–1.24)
GFR, Estimated: >60 mL/min (ref >60)
Glucose, Bld: 122 mg/dL — ABNORMAL HIGH (ref 70–99)
Potassium: 4.4 mmol/L (ref 3.5–5.1)
Sodium: 136 mmol/L (ref 135–145)

## 2021-02-03 LAB — SARS CORONAVIRUS 2 (TAT 6-24 HRS): SARS Coronavirus 2: NEGATIVE

## 2021-02-03 LAB — CBC
HCT: 44.5 % (ref 39.0–52.0)
Hemoglobin: 15.3 g/dL (ref 13.0–17.0)
MCH: 33.6 pg (ref 26.0–34.0)
MCHC: 34.4 g/dL (ref 30.0–36.0)
MCV: 97.6 fL (ref 80.0–100.0)
Platelets: 225 10*3/uL (ref 150–400)
RBC: 4.56 MIL/uL (ref 4.22–5.81)
RDW: 11.9 % (ref 11.5–15.5)
WBC: 10.3 10*3/uL (ref 4.0–10.5)
nRBC: 0 % (ref 0.0–0.2)

## 2021-02-03 MED ORDER — QUETIAPINE FUMARATE 100 MG PO TABS: 100.0000 mg | ORAL_TABLET | Freq: Every evening | ORAL | Status: DC | PRN

## 2021-02-03 NOTE — Progress Notes (Signed)
Physical Therapy Treatment Patient Details Name: Adam Rowland MRN: [id number] DOB: [birth year] Today's Date: 02/03/2021    History of Present Illness Pt is a 75 y.o. M who presents with acute metabolic encephalopathy. CT, UDS, MRI unremarkable. EEG negative for seizures. Significant PMH: dementia, CAD, HTN, HLD.    PT Comments    Pt received in bed, agreeable to therapy session and with good participation and tolerance for gait progression and transfer training. Pt needing min to modA physical support for household distance gait trial and had difficulty managing RW, did better with HHA but did have LOB x2 due to narrow BOS/scissoring. Pt needing totalA for peri-care after toileting. Plan to trial gait with cane next session to see if it is less cumbersome for him to manage than RW. Pt continues to benefit from PT services to progress toward functional mobility goals. Continue to recommend SNF.   Follow Up Recommendations  SNF;Supervision/Assistance - 24 hour     Equipment Recommendations  Rolling walker with 5" wheels;3in1 (PT)    Recommendations for Other Services       Precautions / Restrictions Precautions Precautions: Fall Restrictions Weight Bearing Restrictions: No    Mobility  Bed Mobility Overal bed mobility: Needs Assistance Bed Mobility: Supine to Sit     Supine to sit: Min assist;HOB elevated     General bed mobility comments: Min A to advance trunk and scoot to EOB, multimodal cues for hand placement/self-assist, increased time to perform.    Transfers Overall transfer level: Needs assistance Equipment used: Rolling walker (2 wheeled);None Transfers: Sit to/from Stand Sit to Stand: Min assist         General transfer comment: Min A to stand from bedside and from low toilet height using Halbig rail, needs verbal and tactile cues for hand placement.  Ambulation/Gait Ambulation/Gait assistance: Min assist;Mod assist Gait Distance (Feet): 100 Feet (82ft +RW to  toilet then 173ft with HHA) Assistive device: Rolling walker (2 wheeled);1 person hand held assist Gait Pattern/deviations: Step-through pattern;Decreased stride length;Scissoring;Shuffle;Drifts right/left;Narrow base of support Gait velocity: decreased Gait velocity interpretation: <1.8 ft/sec, indicate of risk for recurrent falls General Gait Details: pt able to progress gait distance but had difficulty managing RW and often picked it up off floor, so longer gait trial with U UE support and mostly minA, modA x2 due to scissoring episode with LOB and instability, but able to correct with therapist support.   Stairs             Wheelchair Mobility    Modified Rankin (Stroke Patients Only)       Balance Overall balance assessment: Needs assistance Sitting-balance support: Feet supported Sitting balance-Leahy Scale: Fair Sitting balance - Comments: static sitting and weight shifting EOB without LOB   Standing balance support: Bilateral upper extremity supported;During functional activity Standing balance-Leahy Scale: Poor Standing balance comment: reliant on external support via HHA or RW; x2 LOB due to narrow BOS          Cognition Arousal/Alertness: Awake/alert Behavior During Therapy: WFL for tasks assessed/performed (pt smiling when PTA enters room) Overall Cognitive Status: Impaired/Different from baseline Area of Impairment: Orientation;Attention;Memory;Safety/judgement;Awareness;Problem solving;Following commands                 Orientation Level: Disoriented to;Place;Time;Situation (pt able to state his name and DOB) Current Attention Level: Sustained Memory: Decreased short-term memory Following Commands: Follows one step commands with increased time;Follows multi-step commands inconsistently Safety/Judgement: Decreased awareness of safety;Decreased awareness of deficits Awareness: Intellectual Problem Solving: Requires  verbal cues;Difficulty  sequencing;Slow processing;Requires tactile cues;Decreased initiation General Comments: Pt able to state his name with increased time. Able to follow 1 step commands, requires redirection for sustaining attention intermittently. He benefits from multimodal cues for activities, slow to process.      Exercises Other Exercises Other Exercises: B hamstring stretch 60 sec x2 and B heel cord stretch 30 sec x2    General Comments General comments (skin integrity, edema, etc.): VSS on RA      Pertinent Vitals/Pain Pain Assessment: No/denies pain           PT Goals (current goals can now be found in the care plan section) Acute Rehab PT Goals Patient Stated Goal: pt wife would like for his mentation to improve and get stronger PT Goal Formulation: With patient/family Time For Goal Achievement: 02/14/21 Potential to Achieve Goals: Good Progress towards PT goals: Progressing toward goals    Frequency    Min 3X/week      PT Plan Current plan remains appropriate       AM-PAC PT "6 Clicks" Mobility   Outcome Measure  Help needed turning from your back to your side while in a flat bed without using bedrails?: A Little Help needed moving from lying on your back to sitting on the side of a flat bed without using bedrails?: A Little Help needed moving to and from a bed to a chair (including a wheelchair)?: A Little Help needed standing up from a chair using your arms (e.g., wheelchair or bedside chair)?: A Little Help needed to walk in hospital room?: A Lot Help needed climbing 3-5 steps with a railing? : A Lot 6 Click Score: 16    End of Session Equipment Utilized During Treatment: Gait belt Activity Tolerance: Patient tolerated treatment well Patient left: in chair;with call bell/phone within reach;with chair alarm set;with family/visitor present (spouse present to supervise while he eats) Nurse Communication: Mobility status PT Visit Diagnosis: Unsteadiness on feet  (R26.81);Difficulty in walking, not elsewhere classified (R26.2)     Time: 1610-9604 PT Time Calculation (min) (ACUTE ONLY): 31 min  Charges:  $Gait Training: 8-22 mins $Therapeutic Activity: 8-22 mins                     Karder Goodin P., PTA Acute Rehabilitation Services Pager: [phone] Office: [phone]   Angus Palms 02/03/2021, 2:26 PM

## 2021-02-03 NOTE — Care Management Important Message (Signed)
Important Message  Patient Details  Name: Adam Rowland MRN: [id number] Date of Birth: [birth year]   Medicare Important Message Given:  Yes     Dorena Bodo 02/03/2021, 1:29 PM

## 2021-02-03 NOTE — Progress Notes (Signed)
Occupational Therapy Treatment Patient Details Name: Adam Rowland MRN: [id number] DOB: 1946-04-20 Today's Date: 02/03/2021    History of present illness Pt is a 75 y.o. M who presents with acute metabolic encephalopathy. CT, UDS, MRI unremarkable. EEG negative for seizures. Significant PMH: dementia, CAD, HTN, HLD.   OT comments  Pt received sitting in recliner with his wife present. Pt's wife reports pt is more confused this date. Pt talking nonsensically during session. Pt required minA for functional mobility at RW level, no loss of balance noted this session. He required multimodal cues for sequencing while grooming at sink level. Pt was pleasantly confused throughout the session. Pt will continue to benefit from skilled OT services to maximize safety and independence with ADL/IADL and functional mobility. Will continue to follow acutely and progress as tolerated.    Follow Up Recommendations  SNF;Supervision/Assistance - 24 hour    Equipment Recommendations  3 in 1 bedside commode;Tub/shower bench;Other (comment);Wheelchair (measurements OT);Wheelchair cushion (measurements OT)    Recommendations for Other Services      Precautions / Restrictions Precautions Precautions: Fall Restrictions Weight Bearing Restrictions: No       Mobility Bed Mobility Overal bed mobility: Needs Assistance Bed Mobility: Supine to Sit     Supine to sit: Min assist;HOB elevated     General bed mobility comments: pt sitting in recliner upon arrival, left pt in recliner at end of session    Transfers Overall transfer level: Needs assistance Equipment used: Rolling walker (2 wheeled);None Transfers: Sit to/from Stand Sit to Stand: Min assist         General transfer comment: minA for safety, stabiltiy and cues for safe hand placement    Balance Overall balance assessment: Needs assistance Sitting-balance support: Feet supported Sitting balance-Leahy Scale: Fair Sitting balance -  Comments: static sitting and weight shifting EOB without LOB   Standing balance support: No upper extremity supported;During functional activity Standing balance-Leahy Scale: Poor Standing balance comment: minA for stability while pt completed ADL in standing without UE support, no loss of balance noted this session                           ADL either performed or assessed with clinical judgement   ADL Overall ADL's : Needs assistance/impaired     Grooming: Minimal assistance;Standing;Oral care;Wash/dry face Grooming Details (indicate cue type and reason): while standing at sink level, pt required cues for terminating and initiating tasks                 Toilet Transfer: Ambulation;RW;Minimal assistance Toilet Transfer Details (indicate cue type and reason): simulated, pt required cues for sequencing, safe hand placement and minA for stability pt with intermittent posterior lean         Functional mobility during ADLs: Minimal assistance;Rolling walker General ADL Comments: pt limited by instability, cognition     Vision   Vision Assessment?: No apparent visual deficits   Perception     Praxis      Cognition Arousal/Alertness: Awake/alert Behavior During Therapy: WFL for tasks assessed/performed Overall Cognitive Status: Impaired/Different from baseline Area of Impairment: Orientation;Attention;Memory;Safety/judgement;Awareness;Problem solving;Following commands                 Orientation Level: Disoriented to;Place;Time;Situation (pt able to state his name and DOB) Current Attention Level: Sustained Memory: Decreased short-term memory Following Commands: Follows one step commands with increased time;Follows multi-step commands inconsistently Safety/Judgement: Decreased awareness of safety;Decreased awareness of deficits Awareness: Intellectual  Problem Solving: Requires verbal cues;Difficulty sequencing;Slow processing;Requires tactile  cues;Decreased initiation General Comments: pt's wife reports pt's cognition worse today. Pt talking nonscensically during session, appeared to have difficulty with word finding. Pt's grandson's name is Beau pt pronounced it Deau. Pt with slow processing, requires multimodal cues for tasks and cues to terminate task (finish brushing teeth and cues to initiate washing face)        Exercises Other Exercises Other Exercises: B hamstring stretch 60 sec x2 and B heel cord stretch 30 sec x2   Shoulder Instructions       General Comments VSS on Ra    Pertinent Vitals/ Pain       Pain Assessment: No/denies pain  Home Living                                          Prior Functioning/Environment              Frequency  Min 2X/week        Progress Toward Goals  OT Goals(current goals can now be found in the care plan section)  Progress towards OT goals: Progressing toward goals  Acute Rehab OT Goals Patient Stated Goal: pt wife would like for his mentation to improve and get stronger OT Goal Formulation: With patient/family Time For Goal Achievement: 02/15/21 Potential to Achieve Goals: Good ADL Goals Pt Will Perform Grooming: with supervision;standing Pt Will Perform Upper Body Dressing: with supervision;sitting Pt Will Perform Lower Body Dressing: with min assist;sit to/from stand Pt Will Transfer to Toilet: with supervision;ambulating Pt Will Perform Toileting - Clothing Manipulation and hygiene: with min assist;sit to/from stand;sitting/lateral leans  Plan Discharge plan remains appropriate    Co-evaluation                 AM-PAC OT "6 Clicks" Daily Activity     Outcome Measure   Help from another person eating meals?: A Little Help from another person taking care of personal grooming?: A Little Help from another person toileting, which includes using toliet, bedpan, or urinal?: A Little Help from another person bathing (including washing,  rinsing, drying)?: A Little Help from another person to put on and taking off regular upper body clothing?: A Little Help from another person to put on and taking off regular lower body clothing?: A Little 6 Click Score: 18    End of Session Equipment Utilized During Treatment: Gait belt;Rolling walker  OT Visit Diagnosis: Unsteadiness on feet (R26.81);Other abnormalities of gait and mobility (R26.89);Muscle weakness (generalized) (M62.81);Other symptoms and signs involving cognitive function   Activity Tolerance Patient tolerated treatment well   Patient Left with call bell/phone within reach;with family/visitor present;in chair;with chair alarm set   Nurse Communication Mobility status        Time: 0630-1601 OT Time Calculation (min): 24 min  Charges: OT General Charges $OT Visit: 1 Visit OT Treatments $Self Care/Home Management : 23-37 mins  Rosey Bath OTR/L Acute Rehabilitation Services Office: [phone]    Rebeca Alert 02/03/2021, 3:39 PM

## 2021-02-03 NOTE — TOC Progression Note (Addendum)
Transition of Care [facility]) - Progression Note    Patient Details  Name: Adam Rowland MRN: [id number] Date of Birth: April 18, 1946  Transition of [facility] CM/SW Contact  Eduard Roux, [place] Phone Number: 02/03/2021, 3:55 PM  Clinical Narrative:     Informed patient's spouse no bed offers at [facility]- informed of other bed offers-advised a decision has to be made today. She states understanding. Contacted [facility]- waiting on response  Antony Blackbird, MSW, LCSW Clinical Social Worker   Expected Discharge Plan: Home w Home Health Services Barriers to Discharge: Continued Medical Work up  Expected Discharge Plan and Services Expected Discharge Plan: Home w Home Health Services In-house Referral: Clinical Social Work                                             Social Determinants of Health (SDOH) Interventions    Readmission Risk Interventions No flowsheet data found.

## 2021-02-03 NOTE — Progress Notes (Signed)
PROGRESS NOTE    Adam Rowland  RXV:[id number] DOB: [birth year] DOA: 01/29/2021 PCP: Bennie Pierini, FNP   Brief Narrative:  Adam Rowland is a 75 y.o. male with medical history significant of dementia, coronary artery disease, hypertension, hyperlipidemia, who lives at home with his wife.  Patient was in his usual state of health the night prior to admission, he woke up in the morning confused unable to carry on a meaningful conversation with slurred speech.  Notable ambulation but no clear explanation of possible deficits but was extremely high fall risk which is unusual for him.  Patient ultimately brought to the ED for further evaluation, admitted for acute CVA work-up and neurology was consulted.  Of note no changes in patient's lifestyle food no recent travels only new medication was Flomax initiated 48 hours prior to admission.  Assessment & Plan:   Active Problems:   Hyperlipidemia   Hypertension   CAD (coronary artery disease)   Dementia (HCC)   AMS (altered mental status)   AF (paroxysmal atrial fibrillation) (HCC)   Acute metabolic encephalopathy  Acute metabolic encephalopathy, likely secondary to sundowning/hospital delirium, ongoing, minimally improving over the past 24 hours -Neurology previously signed off given no acute findings -Patient's imaging including CT head and MRI are unremarkable  -UDS unremarkable, no history of substance abuse or issues with alcohol per family or patient - currently holding Flomax and memantine per neurology recommendations -No signs or symptoms of infectious process -Will repeat cultures today to ensure no underlying or subclinical infection ongoing -Mental status previously improving within 24 hours of admission, thought to be due to polypharmacy with memantine and Flomax -Unfortunately patient had worsening mental status and hallucinations after initial improvement concerning for in the hospital delirium -increase Seroquel tonight to 100  mg  Provoked afib/flutter, provoked versus paroxysmal, rate controlled -Patient's wife states he has had diagnosis of A. fib in the past but is not currently on anticoagulation for unclear reasoning (potentially low afib burden or bleeding risk?) -Patient converted to sinus rhythm overnight on the fifth, now back in A. fib but rate controlled on the seventh -If patient continues to have bradycardia while in sinus at borderline tachycardia while in A. fib he may benefit from cardiology evaluation for sick sinus syndrome or similar -unclear if this is provoked due to acute delirium as above or his baseline  Hypertension  -Continue metoprolol and lisinopril; transient bradycardia 48 hours ago while in sinus rhythm now resolved back in A. fib rate around 100-110  Hyperlipidemia -Continue on statin  Dementia, unspecified subtype -Continue on home dose of Aricept and Namenda  Hx of Coronary artery disease - No complaints of chest pain at this time - Continue aspirin, beta-blocker and statin  DVT prophylaxis: Lovenox Code Status: Full code Family Communication: At bedside  Status is: Inpatient  Dispo: The patient is from: Home              Anticipated d/c is to: Home              Anticipated d/c date is: 24 to 48 hours              Patient currently not medically stable for discharge  Consultants:   Neurology  Procedures:   None  Antimicrobials:  None indicated  Subjective: No acute issues or events overnight, patient slept well last night, mental status appears to be stable not yet back to baseline per wife at bedside but minimally improved from previous  Objective:  Vitals:   02/02/21 2311 02/03/21 0400 02/03/21 0516 02/03/21 0637  BP: 116/78 (!) 148/73    Pulse:   64   Resp: 18     Temp: 97.9 F (36.6 C) 97.7 F (36.5 C)    TempSrc: Oral Oral    SpO2: 98%  98%   Weight:    76.6 kg  Height:        Intake/Output Summary (Last 24 hours) at 02/03/2021 0733 Last  data filed at 02/03/2021 0516 Gross per 24 hour  Intake 360 ml  Output 1200 ml  Net -840 ml   Filed Weights   01/29/21 2215 02/02/21 0327 02/03/21 0637  Weight: 73.4 kg 76.6 kg 76.6 kg    Examination:  General exam: No acute distress resting comfortably eating breakfast on his own Respiratory system: Clear to auscultation. Respiratory effort normal. Cardiovascular system: S1 & S2 heard, RRR. No JVD, murmurs, rubs, gallops or clicks. No pedal edema. Gastrointestinal system: Abdomen is nondistended, soft and nontender. No organomegaly or masses felt. Normal bowel sounds heard. Central nervous system: More appropriate conversation and answers today but question and answer are still quite delayed from his baseline per wife Extremities: Symmetric 5 x 5 power; without overt deficit. Skin: No rashes, lesions or ulcers  Data Reviewed: I have personally reviewed following labs and imaging studies  CBC: Recent Labs  Lab 01/28/21 0923 01/29/21 0859 01/29/21 0859 01/29/21 1705 01/30/21 0027 02/01/21 0258 02/02/21 0213 02/03/21 0106  WBC 6.2 8.8   < > 6.7 7.1 8.5 8.1 10.3  NEUTROABS 4.2 7.3  --   --   --   --   --   --   HGB 15.8 15.0   < > 15.9 14.4 13.9 14.4 15.3  HCT 45.0 44.2   < > 47.3 41.2 40.9 41.8 44.5  MCV 96 100.2*   < > 101.1* 96.7 98.1 97.4 97.6  PLT 212 164   < > 144* 142* 178 195 225   < > = values in this interval not displayed.   Basic Metabolic Panel: Recent Labs  Lab 01/29/21 0859 01/29/21 1705 01/30/21 0027 02/01/21 0258 02/02/21 0213 02/03/21 0106  NA 136  --  136 138 137 136  K 4.2  --  3.8 3.8 4.0 4.4  CL 103  --  105 106 103 101  CO2 25  --  23 25 24 24   GLUCOSE 130*  --  150* 108* 108* 122*  BUN 19  --  17 13 19 20   CREATININE 0.84 0.79 1.05 0.99 0.85 0.95  CALCIUM 8.6*  --  8.4* 8.5* 8.6* 8.8*  MG  --   --  1.9  --   --   --    GFR: Estimated Creatinine Clearance: 68.2 mL/min (by C-G formula based on SCr of 0.95 mg/dL). Liver Function  Tests: Recent Labs  Lab 01/28/21 0923 01/29/21 0859 01/30/21 0027  AST 17 19 19   ALT 17 18 19   ALKPHOS 59 50 43  BILITOT 0.8 1.2 0.9  PROT 6.5 6.6 5.4*  ALBUMIN 4.4 3.9 3.1*   No results for input(s): LIPASE, AMYLASE in the last 168 hours. Recent Labs  Lab 01/29/21 1705  AMMONIA 13   Coagulation Profile: Recent Labs  Lab 01/29/21 0859  INR 1.1   Cardiac Enzymes: No results for input(s): CKTOTAL, CKMB, CKMBINDEX, TROPONINI in the last 168 hours. BNP (last 3 results) No results for input(s): PROBNP in the last 8760 hours. HbA1C: No results for input(s): HGBA1C in  the last 72 hours. CBG: Recent Labs  Lab 01/29/21 0843 01/29/21 2105  GLUCAP 126* 103*   Lipid Profile: No results for input(s): CHOL, HDL, LDLCALC, TRIG, CHOLHDL, LDLDIRECT in the last 72 hours. Thyroid Function Tests: No results for input(s): TSH, T4TOTAL, FREET4, T3FREE, THYROIDAB in the last 72 hours. Anemia Panel: No results for input(s): VITAMINB12, FOLATE, FERRITIN, TIBC, IRON, RETICCTPCT in the last 72 hours. Sepsis Labs: No results for input(s): PROCALCITON, LATICACIDVEN in the last 168 hours.  Recent Results (from the past 240 hour(s))  Microscopic Examination     Status: None   Collection Time: 01/28/21  8:36 AM   Urine  Result Value Ref Range Status   WBC, UA None seen 0 - 5 /hpf Final   RBC None seen 0 - 2 /hpf Final   Epithelial Cells (non renal) None seen 0 - 10 /hpf Final   Bacteria, UA None seen None seen/Few Final  Resp Panel by RT-PCR (Flu A&B, Covid) Nasopharyngeal Swab     Status: None   Collection Time: 01/29/21  8:45 AM   Specimen: Nasopharyngeal Swab; Nasopharyngeal(NP) swabs in vial transport medium  Result Value Ref Range Status   SARS Coronavirus 2 by RT PCR NEGATIVE NEGATIVE Final    Comment: (NOTE) SARS-CoV-2 target nucleic acids are NOT DETECTED.  The SARS-CoV-2 RNA is generally detectable in upper respiratory specimens during the acute phase of infection. The  lowest concentration of SARS-CoV-2 viral copies this assay can detect is 138 copies/mL. A negative result does not preclude SARS-Cov-2 infection and should not be used as the sole basis for treatment or other patient management decisions. A negative result may occur with  improper specimen collection/handling, submission of specimen other than nasopharyngeal swab, presence of viral mutation(s) within the areas targeted by this assay, and inadequate number of viral copies(<138 copies/mL). A negative result must be combined with clinical observations, patient history, and epidemiological information. The expected result is Negative.  Fact Sheet for Patients:  BloggerCourse.com  Fact Sheet for Healthcare Providers:  SeriousBroker.it  This test is no t yet approved or cleared by the [place] FDA and  has been authorized for detection and/or diagnosis of SARS-CoV-2 by FDA under an Emergency Use Authorization (EUA). This EUA will remain  in effect (meaning this test can be used) for the duration of the COVID-19 declaration under Section 564(b)(1) of the Act, 21 U.S.C.section 360bbb-3(b)(1), unless the authorization is terminated  or revoked sooner.       Influenza A by PCR NEGATIVE NEGATIVE Final   Influenza B by PCR NEGATIVE NEGATIVE Final    Comment: (NOTE) The Xpert Xpress SARS-CoV-2/FLU/RSV plus assay is intended as an aid in the diagnosis of influenza from Nasopharyngeal swab specimens and should not be used as a sole basis for treatment. Nasal washings and aspirates are unacceptable for Xpert Xpress SARS-CoV-2/FLU/RSV testing.  Fact Sheet for Patients: BloggerCourse.com  Fact Sheet for Healthcare Providers: SeriousBroker.it  This test is not yet approved or cleared by the [place] FDA and has been authorized for detection and/or diagnosis of SARS-CoV-2 by FDA under  an Emergency Use Authorization (EUA). This EUA will remain in effect (meaning this test can be used) for the duration of the COVID-19 declaration under Section 564(b)(1) of the Act, 21 U.S.C. section 360bbb-3(b)(1), unless the authorization is terminated or revoked.  Performed at [facility], [address] [place], [place] [address]     Radiology Studies: No results found. Scheduled Meds: . aspirin  81 mg  Oral Daily  . atorvastatin  40 mg Oral QHS  . donepezil  10 mg Oral QHS  . enoxaparin (LOVENOX) injection  40 mg Subcutaneous Q24H  . lisinopril  20 mg Oral Daily  . metoprolol tartrate  25 mg Oral BID  . polyethylene glycol  17 g Oral BID  . QUEtiapine  12.5 mg Oral Once  . senna-docusate  1 tablet Oral BID   Continuous Infusions: . lactated ringers 75 mL/hr at 01/30/21 1805     LOS: 3 days   Time spent:  Azucena Fallen, DO [facility]  If 7PM-7AM, please contact night-coverage [email or web address]  02/03/2021, 7:33 AM

## 2021-02-03 NOTE — TOC Progression Note (Signed)
Transition of Care [facility]) - Progression Note    Patient Details  Name: Adam Rowland MRN: [id number] Date of Birth: Sep 23, 1945  Transition [facility]) CM/SW Contact  Eduard Roux, [place] Phone Number: 02/03/2021, 4:58 PM  Clinical Narrative:     Patient's spouse accepted bed offer w/ [facility] - advised SNF- anticipate patient will d/c tomorrow  Insurance auth approved # [id number] from  06/8-06/10 Informed MD & RN - covid test needed  Antony Blackbird, MSW, LCSW Clinical Social Worker   Expected Discharge Plan: Home w Home Health Services Barriers to Discharge: Continued Medical Work up  Expected Discharge Plan and Services Expected Discharge Plan: Home w Home Health Services In-house Referral: Clinical Social Work                                             Social Determinants of Health (SDOH) Interventions    Readmission Risk Interventions No flowsheet data found.

## 2021-02-04 DIAGNOSIS — I10 Essential (primary) hypertension: Secondary | ICD-10-CM | POA: Diagnosis not present

## 2021-02-04 DIAGNOSIS — I4892 Unspecified atrial flutter: Secondary | ICD-10-CM | POA: Diagnosis not present

## 2021-02-04 DIAGNOSIS — I25118 Atherosclerotic heart disease of native coronary artery with other forms of angina pectoris: Secondary | ICD-10-CM | POA: Diagnosis not present

## 2021-02-04 DIAGNOSIS — R262 Difficulty in walking, not elsewhere classified: Secondary | ICD-10-CM | POA: Diagnosis not present

## 2021-02-04 DIAGNOSIS — I4891 Unspecified atrial fibrillation: Secondary | ICD-10-CM | POA: Diagnosis not present

## 2021-02-04 DIAGNOSIS — R2681 Unsteadiness on feet: Secondary | ICD-10-CM | POA: Diagnosis not present

## 2021-02-04 DIAGNOSIS — G9340 Encephalopathy, unspecified: Secondary | ICD-10-CM | POA: Diagnosis not present

## 2021-02-04 DIAGNOSIS — E785 Hyperlipidemia, unspecified: Secondary | ICD-10-CM | POA: Diagnosis not present

## 2021-02-04 DIAGNOSIS — Z743 Need for continuous supervision: Secondary | ICD-10-CM | POA: Diagnosis not present

## 2021-02-04 DIAGNOSIS — N401 Enlarged prostate with lower urinary tract symptoms: Secondary | ICD-10-CM | POA: Diagnosis not present

## 2021-02-04 DIAGNOSIS — N400 Enlarged prostate without lower urinary tract symptoms: Secondary | ICD-10-CM | POA: Diagnosis not present

## 2021-02-04 DIAGNOSIS — G9341 Metabolic encephalopathy: Secondary | ICD-10-CM | POA: Diagnosis not present

## 2021-02-04 DIAGNOSIS — I959 Hypotension, unspecified: Secondary | ICD-10-CM | POA: Diagnosis not present

## 2021-02-04 DIAGNOSIS — F0390 Unspecified dementia without behavioral disturbance: Secondary | ICD-10-CM | POA: Diagnosis not present

## 2021-02-04 DIAGNOSIS — I2510 Atherosclerotic heart disease of native coronary artery without angina pectoris: Secondary | ICD-10-CM | POA: Diagnosis not present

## 2021-02-04 DIAGNOSIS — I483 Typical atrial flutter: Secondary | ICD-10-CM

## 2021-02-04 DIAGNOSIS — R410 Disorientation, unspecified: Secondary | ICD-10-CM | POA: Diagnosis not present

## 2021-02-04 LAB — BASIC METABOLIC PANEL
Anion gap: 7 (ref 5–15)
BUN: 18 mg/dL (ref 8–23)
CO2: 25 mmol/L (ref 22–32)
Calcium: 8.7 mg/dL — ABNORMAL LOW (ref 8.9–10.3)
Chloride: 103 mmol/L (ref 98–111)
Creatinine, Ser: 0.91 mg/dL (ref 0.61–1.24)
GFR, Estimated: >60 mL/min (ref >60)
Glucose, Bld: 116 mg/dL — ABNORMAL HIGH (ref 70–99)
Potassium: 4.3 mmol/L (ref 3.5–5.1)
Sodium: 135 mmol/L (ref 135–145)

## 2021-02-04 LAB — CBC
HCT: 44.1 % (ref 39.0–52.0)
Hemoglobin: 15.4 g/dL (ref 13.0–17.0)
MCH: 33.7 pg (ref 26.0–34.0)
MCHC: 34.9 g/dL (ref 30.0–36.0)
MCV: 96.5 fL (ref 80.0–100.0)
Platelets: 218 10*3/uL (ref 150–400)
RBC: 4.57 MIL/uL (ref 4.22–5.81)
RDW: 11.9 % (ref 11.5–15.5)
WBC: 8.8 10*3/uL (ref 4.0–10.5)
nRBC: 0 % (ref 0.0–0.2)

## 2021-02-04 MED ORDER — QUETIAPINE FUMARATE 25 MG PO TABS: 50.0000 mg | ORAL_TABLET | Freq: Every evening | ORAL | Status: DC | PRN

## 2021-02-04 MED ORDER — QUETIAPINE FUMARATE 25 MG PO TABS: 25.0000 mg | ORAL_TABLET | Freq: Every day | ORAL | 1 refills | Status: DC

## 2021-02-04 MED ORDER — SENNOSIDES-DOCUSATE SODIUM 8.6-50 MG PO TABS: 1.0000 | ORAL_TABLET | Freq: Two times a day (BID) | ORAL | 0 refills | Status: DC | PRN

## 2021-02-04 MED ORDER — POLYETHYLENE GLYCOL 3350 17 GM/SCOOP PO POWD: 17.0000 g | Freq: Two times a day (BID) | ORAL | 0 refills | Status: DC | PRN

## 2021-02-04 NOTE — Discharge Summary (Signed)
Physician Discharge Summary  Adam Rowland [place] ZOX:[id number] DOB: 12/18/1945 DOA: 01/29/2021  PCP: Bennie Pierini, FNP  Admit date: 01/29/2021 Discharge date: 02/04/2021  Admitted From: Home Disposition: SNF  Recommendations for Outpatient Follow-up:  Follow ups as below. Please obtain CBC/BMP/Mag at follow up Please follow up on the following pending results: None   Discharge Condition: Stable CODE STATUS: Full code  Hospital Course: 75 year old M with PMH of dementia, CAD, HTN, HLD and BPH presenting with acute mental status change with slurred speech and ambulatory dysfunction.  He was recently started on Flomax.  He has had extensive evaluation without clear explanation for his symptoms except for his second EKG that showed rate controlled atrial flutter.  Neurology consulted.  Basic labs, urine drug screen, TSH, ammonia, B12, CT head, MRI brain and TTE unrevealing.  EEG with nonspecific cortical dysfunction arising from left temporal region and moderate diffuse encephalopathy but no seizure or epileptiform discharge.  Neurology recommended discontinuing Flomax and Namenda.   Patient's a flutter was short-lived and he converted back to sinus rhythm.  Patient's wife stated that he had a diagnosis of A. fib but not on anticoagulation likely due to his fall risk and low A. fib burden.    Patient was evaluated by therapy who recommended SNF before returning home.   See individual problem list below for more on hospital course.  Discharge Diagnoses:  Acute metabolic encephalopathy/strokelike symptoms in patient with advanced dementia-extensive unrevealing work-up except for short-lived atrial flutter.  Neurology recommended holding Flomax and memantine.  Therapy recommended SNF.  Started on Seroquel 100 mg nightly but he was very sleepy in the morning.  On the day of discharge, he was a sleepy but wakes to voice.  Oriented to self and wife.  Follows commands.  No focal  neurodeficit. -Decrease Seroquel to 25 mg nightly. -Continue home Aricept -Reorientation and delirium precautions -Minimize or avoid sedating medications   A flutter: One of his EKG showed rate controlled a flutter but he converted to sinus rhythm later on.  Reportedly has history of paroxysmal A. fib but not on AC.  TTE without significant finding. -Continue home metoprolol for rate control -Defer anticoagulation due to high risk for   Hypertension: Normotensive -Continue metoprolol and lisinopril -Liberated diet given dementia.   Hyperlipidemia -Continue on statin   Dementia, unspecified subtype -Continue Aricept.  Namenda on hold.   Hx of Coronary artery disease -Continue aspirin, beta-blocker and statin  BPH?  Recently started on Flomax.  Doing well off Flomax here -Monitor urine output  Body mass index is 24.94 kg/m.            Discharge Exam: Vitals:   02/04/21 0355 02/04/21 0834  BP: 124/64 123/66  Pulse: 63 71  Resp: 19 18  Temp: 97.6 F (36.4 C) 97.8 F (36.6 C)  SpO2: 98% 99%    GENERAL: No apparent distress.  Nontoxic. HEENT: MMM.  Vision and hearing grossly intact.  NECK: Supple.  No apparent JVD.  RESP: On RA.  No IWOB.  Fair aeration bilaterally. CVS:  RRR. Heart sounds normal.  ABD/GI/GU: Bowel sounds present. Soft. Non tender.  MSK/EXT:  Moves extremities. No apparent deformity. No edema.  SKIN: no apparent skin lesion or wound NEURO: Sleepy but wakes to voice.  Oriented to self and wife.  Follows commands.  No apparent focal neuro deficit. PSYCH: Calm. Normal affect.   Discharge Instructions  Discharge Instructions     Diet general   Complete by: As directed  Increase activity slowly   Complete by: As directed       Allergies as of 02/04/2021   No Known Allergies      Medication List     STOP taking these medications    memantine 10 MG tablet Commonly known as: Namenda   tamsulosin 0.4 MG Caps capsule Commonly known  as: FLOMAX       TAKE these medications    aspirin 81 MG EC tablet Take 1 tablet (81 mg total) by mouth daily.   atorvastatin 40 MG tablet Commonly known as: LIPITOR TAKE 1 TABLET ONCE DAILY AT BEDTIME   donepezil 10 MG tablet Commonly known as: ARICEPT Take 1 tablet (10 mg total) by mouth at bedtime.   lisinopril 20 MG tablet Commonly known as: ZESTRIL Take 1 tablet (20 mg total) by mouth daily.   metoprolol tartrate 25 MG tablet Commonly known as: LOPRESSOR Take 1 tablet (25 mg total) by mouth 2 (two) times daily.   nitroGLYCERIN 0.4 MG SL tablet Commonly known as: NITROSTAT Place 1 tablet (0.4 mg total) under the tongue every 5 (five) minutes x 3 doses as needed for chest pain.   polyethylene glycol powder 17 GM/SCOOP powder Commonly known as: MiraLax Take 17 g by mouth 2 (two) times daily as needed for moderate constipation.   QUEtiapine 25 MG tablet Commonly known as: SEROQUEL Take 1 tablet (25 mg total) by mouth at bedtime.   senna-docusate 8.6-50 MG tablet Commonly known as: Senokot-S Take 1 tablet by mouth 2 (two) times daily between meals as needed for mild constipation.        Consultations: Neurology  Procedures/Studies:   MR BRAIN WO CONTRAST  Result Date: 01/29/2021 CLINICAL DATA:  Neuro deficit, acute, stroke suspected. Additional history provided: Speech difficulty, confusion, dementia. EXAM: MRI HEAD WITHOUT CONTRAST TECHNIQUE: Multiplanar, multiecho pulse sequences of the brain and surrounding structures were obtained without intravenous contrast. COMPARISON:  Noncontrast head CT and CT angiogram head/neck 01/29/2021. Brain MRI 08/18/2017. FINDINGS: Brain: Mild intermittent motion degradation. Mild cerebral and cerebellar atrophy. No cortical encephalomalacia is identified. No significant white matter disease. There is no acute infarct. No evidence of intracranial mass. No chronic intracranial blood products. No extra-axial fluid collection. No  midline shift. Incidentally noted mega cisterna magna. Vascular: Expected proximal arterial flow voids. Skull and upper cervical spine: No focal marrow lesion. Sinuses/Orbits: Visualized orbits show no acute finding. Postsurgical appearance of the paranasal sinuses. Mild mucosal thickening within the bilateral ethmoidectomy cavities and within the left greater than right maxillary sinuses. IMPRESSION: Mildly motion degraded exam. No evidence of acute intracranial abnormality. Mild generalized parenchymal atrophy. Mild paranasal sinus mucosal thickening, as described. Electronically Signed   By: Jackey Loge DO   On: 01/29/2021 11:23   DG Chest Port 1 View  Result Date: 01/29/2021 CLINICAL DATA:  Confusion, weakness EXAM: PORTABLE CHEST 1 VIEW COMPARISON:  None. FINDINGS: The heart size and mediastinal contours are within normal limits. Both lungs are clear. Stent projects over the left heart. The visualized skeletal structures are unremarkable. IMPRESSION: No active disease. Electronically Signed   By: Malachy Moan M.D.   On: 01/29/2021 10:14   EEG adult  Result Date: 01/31/2021 Charlsie Quest, MD     01/31/2021  1:59 PM Patient Name: LOI RENNAKER MRN: [id number] Epilepsy Attending: Charlsie Quest Referring Physician/Provider: Dr Caryl Pina Date: 01/31/2021 Duration: 22.26 mins Patient history: 75yo M with ams. EEG to evaluate for seizure Level of alertness: Awake AEDs during EEG  study: None Technical aspects: This EEG study was done with scalp electrodes positioned according to the 10-20 International system of electrode placement. Electrical activity was acquired at a sampling rate of 500Hz  and reviewed with a high frequency filter of 70Hz  and a low frequency filter of 1Hz . EEG data were recorded continuously and digitally stored. Description: The posterior dominant rhythm consists of 7 Hz activity of moderate voltage (25-35 uV) seen predominantly in posterior head regions, symmetric and reactive to  eye opening and eye closing. EEG showed continuous generalized and maximal left temporal 3 to 6 Hz theta-delta slowing.  Hyperventilation and photic stimulation were not performed.   ABNORMALITY - Continuous slow, generalized and maximal left temporal IMPRESSION: This study is suggestive of non-specific cortical dysfunction arising from left temporal region. Additionally there is moderate diffuse encephalopathy, nonspecific etiology. No seizures or epileptiform discharges were seen throughout the recording. Charlsie Quest   ECHOCARDIOGRAM COMPLETE  Result Date: 01/30/2021    ECHOCARDIOGRAM REPORT   Patient Name:   KEADEN GUNNOE [place] Date of Exam: 01/30/2021 Medical Rec #:  [id number]   Height:       69.0 in Accession #:    [id number]  Weight:       161.8 lb Date of Birth:  Aug 31, 1945   BSA:          1.888 m Patient Age:    75 years    BP:           105/67 mmHg Patient Gender: M           HR:           77 bpm. Exam Location:  Inpatient Procedure: 2D Echo Indications:    atrial flutter  History:        Patient has prior history of Echocardiogram examinations, most                 recent 09/05/2019. CAD, Arrythmias:Atrial Fibrillation,                 Signs/Symptoms:Altered Mental Status; Risk Factors:Hypertension                 and Dyslipidemia.  Sonographer:    Delcie Roch Referring Phys: [id number] Deno Lunger [place] IMPRESSIONS  1. Left ventricular ejection fraction, by estimation, is 55 to 60%. The left ventricle has normal function. The left ventricle has no regional Picking motion abnormalities. There is mild asymmetric left ventricular hypertrophy of the septal segment. Left ventricular diastolic parameters are indeterminate in the setting of atrial flutter.  2. Right ventricular systolic function is low normal. The right ventricular size is normal. Tricuspid regurgitation signal is inadequate for assessing PA pressure.  3. The mitral valve is grossly normal. Trivial mitral valve regurgitation.  4. The aortic valve is  tricuspid. Aortic valve regurgitation is not visualized.  5. The inferior vena cava is normal in size with greater than 50% respiratory variability, suggesting right atrial pressure of 3 mmHg. FINDINGS  Left Ventricle: Left ventricular ejection fraction, by estimation, is 55 to 60%. The left ventricle has normal function. The left ventricle has no regional Spragg motion abnormalities. The left ventricular internal cavity size was normal in size. There is  mild asymmetric left ventricular hypertrophy of the septal segment. Left ventricular diastolic parameters are indeterminate. Right Ventricle: The right ventricular size is normal. No increase in right ventricular Enright thickness. Right ventricular systolic function is low normal. Tricuspid regurgitation signal is inadequate for assessing PA pressure. Left Atrium: Left atrial  size was normal in size. Right Atrium: Right atrial size was normal in size. Pericardium: There is no evidence of pericardial effusion. Mitral Valve: The mitral valve is grossly normal. Mild mitral annular calcification. Trivial mitral valve regurgitation. Tricuspid Valve: The tricuspid valve is grossly normal. Tricuspid valve regurgitation is trivial. Aortic Valve: The aortic valve is tricuspid. Aortic valve regurgitation is not visualized. Pulmonic Valve: The pulmonic valve was grossly normal. Pulmonic valve regurgitation is trivial. Aorta: The aortic root is normal in size and structure. Venous: The inferior vena cava is normal in size with greater than 50% respiratory variability, suggesting right atrial pressure of 3 mmHg. IAS/Shunts: The interatrial septum appears to be lipomatous. No atrial level shunt detected by color flow Doppler.  LEFT VENTRICLE PLAX 2D LVIDd:         4.00 cm LVIDs:         2.70 cm LV PW:         1.00 cm LV IVS:        1.20 cm LVOT diam:     1.70 cm LVOT Area:     2.27 cm  RIGHT VENTRICLE             IVC RV S prime:     11.60 cm/s  IVC diam: 1.60 cm TAPSE (M-mode): 1.0  cm LEFT ATRIUM             Index       RIGHT ATRIUM           Index LA diam:        3.70 cm 1.96 cm/m  RA Area:     15.00 cm LA Vol (A2C):   43.0 ml 22.77 ml/m RA Volume:   36.40 ml  19.28 ml/m LA Vol (A4C):   59.1 ml 31.30 ml/m LA Biplane Vol: 54.8 ml 29.02 ml/m   AORTA Ao Root diam: 3.40 cm Ao Asc diam:  3.40 cm  SHUNTS Systemic Diam: 1.70 cm Nona Dell MD Electronically signed by Nona Dell MD Signature Date/Time: 01/30/2021/1:09:49 PM    Final    CT HEAD CODE STROKE WO CONTRAST  Result Date: 01/29/2021 CLINICAL DATA:  Code stroke. 75 year old male with confusion and weakness. EXAM: CT HEAD WITHOUT CONTRAST TECHNIQUE: Contiguous axial images were obtained from the base of the skull through the vertex without intravenous contrast. COMPARISON:  Brain MRI 08/18/2017. FINDINGS: Brain: Cerebral volume is stable since 2018 and within normal limits for age. No midline shift, ventriculomegaly, mass effect, evidence of mass lesion, intracranial hemorrhage or evidence of cortically based acute infarction. Gray-white matter differentiation is within normal limits throughout the brain. No cortical encephalomalacia identified. Vascular: Calcified atherosclerosis at the skull base. No suspicious intracranial vascular hyperdensity. Skull: No acute osseous abnormality identified. Sinuses/Orbits: Previous paranasal sinus surgery and chronic bilateral sinus disease appears stable since 2018. Tympanic cavities and mastoids are clear. Other: No acute orbit or scalp soft tissue finding. ASPECTS [facility] Stroke Program Early CT Score) Total score (0-10 with 10 being normal): 10 IMPRESSION: 1. Normal for age non contrast CT appearance of the brain. 2. This was discussed by telephone with Dr. Jacqulyn Bath in the ED on 01/29/2021 at 09:14 . 3. Chronic paranasal sinus disease, prior sinus surgery. Electronically Signed   By: Odessa Fleming M.D.   On: 01/29/2021 09:15   CT ANGIO HEAD NECK W WO CM W PERF (CODE STROKE)  Result Date:  01/29/2021 CLINICAL DATA:  Code stroke follow-up, fusion and weakness EXAM: CT ANGIOGRAPHY HEAD AND NECK CT  PERFUSION BRAIN TECHNIQUE: Multidetector CT imaging of the head and neck was performed using the standard protocol during bolus administration of intravenous contrast. Multiplanar CT image reconstructions and MIPs were obtained to evaluate the vascular anatomy. Carotid stenosis measurements (when applicable) are obtained utilizing NASCET criteria, using the distal internal carotid diameter as the denominator. Multiphase CT imaging of the brain was performed following IV bolus contrast injection. Subsequent parametric perfusion maps were calculated using RAPID software. CONTRAST:  OMNIPAQUE IOHEXOL 350 MG/ML SOLN COMPARISON:  None. FINDINGS: CTA NECK FINDINGS Aortic arch: Mild calcified plaque along the arch and patent great vessel origins. Right carotid system: Patent. Trace calcified plaque at the common carotid bifurcation. No stenosis. Left carotid system: Patent.  No stenosis. Vertebral arteries: Patent and codominant. There is calcified plaque at the origins bilaterally. At least moderate stenosis on the right. No stenosis on the left. Skeleton: Degenerative changes of the cervical spine primarily at C5-C6. Other neck: Unremarkable. Upper chest: Included upper lungs are clear. Review of the MIP images confirms the above findings CTA HEAD FINDINGS Anterior circulation: Intracranial internal carotid arteries are patent with calcified plaque causing mild stenosis. Anterior cerebral arteries are patent with anterior communicating artery present. Middle cerebral arteries are patent. Posterior circulation: Intracranial vertebral arteries are patent. Basilar artery is patent. Major cerebellar artery origins are patent. Posterior cerebral arteries are patent. Venous sinuses: As permitted by contrast timing, patent. Review of the MIP images confirms the above findings CT Brain Perfusion Findings: CBF (<30%)  Volume: 54mL Perfusion (Tmax>6.0s) volume: 92mL Mismatch Volume: 51mL Infarction Location: None. IMPRESSION: No large vessel occlusion in the neck. No stenosis at the ICA origins. Calcified plaque at the right vertebral artery origin causing at least moderate stenosis. No proximal intracranial vessel occlusion or significant stenosis. Perfusion imaging demonstrates no evidence of core infarction or penumbra. Electronically Signed   By: Guadlupe Spanish M.D.   On: 01/29/2021 09:46       The results of significant diagnostics from this hospitalization (including imaging, microbiology, ancillary and laboratory) are listed below for reference.     Microbiology: Recent Results (from the past 240 hour(s))  Microscopic Examination     Status: None   Collection Time: 01/28/21  8:36 AM   Urine  Result Value Ref Range Status   WBC, UA None seen 0 - 5 /hpf Final   RBC None seen 0 - 2 /hpf Final   Epithelial Cells (non renal) None seen 0 - 10 /hpf Final   Bacteria, UA None seen None seen/Few Final  Resp Panel by RT-PCR (Flu A&B, Covid) Nasopharyngeal Swab     Status: None   Collection Time: 01/29/21  8:45 AM   Specimen: Nasopharyngeal Swab; Nasopharyngeal(NP) swabs in vial transport medium  Result Value Ref Range Status   SARS Coronavirus 2 by RT PCR NEGATIVE NEGATIVE Final    Comment: (NOTE) SARS-CoV-2 target nucleic acids are NOT DETECTED.  The SARS-CoV-2 RNA is generally detectable in upper respiratory specimens during the acute phase of infection. The lowest concentration of SARS-CoV-2 viral copies this assay can detect is 138 copies/mL. A negative result does not preclude SARS-Cov-2 infection and should not be used as the sole basis for treatment or other patient management decisions. A negative result may occur with  improper specimen collection/handling, submission of specimen other than nasopharyngeal swab, presence of viral mutation(s) within the areas targeted by this assay, and inadequate  number of viral copies(<138 copies/mL). A negative result must be combined with clinical observations, patient  history, and epidemiological information. The expected result is Negative.  Fact Sheet for Patients:  BloggerCourse.com  Fact Sheet for Healthcare Providers:  SeriousBroker.it  This test is no t yet approved or cleared by the [place] FDA and  has been authorized for detection and/or diagnosis of SARS-CoV-2 by FDA under an Emergency Use Authorization (EUA). This EUA will remain  in effect (meaning this test can be used) for the duration of the COVID-19 declaration under Section 564(b)(1) of the Act, 21 U.S.C.section 360bbb-3(b)(1), unless the authorization is terminated  or revoked sooner.       Influenza A by PCR NEGATIVE NEGATIVE Final   Influenza B by PCR NEGATIVE NEGATIVE Final    Comment: (NOTE) The Xpert Xpress SARS-CoV-2/FLU/RSV plus assay is intended as an aid in the diagnosis of influenza from Nasopharyngeal swab specimens and should not be used as a sole basis for treatment. Nasal washings and aspirates are unacceptable for Xpert Xpress SARS-CoV-2/FLU/RSV testing.  Fact Sheet for Patients: BloggerCourse.com  Fact Sheet for Healthcare Providers: SeriousBroker.it  This test is not yet approved or cleared by the [place] FDA and has been authorized for detection and/or diagnosis of SARS-CoV-2 by FDA under an Emergency Use Authorization (EUA). This EUA will remain in effect (meaning this test can be used) for the duration of the COVID-19 declaration under Section 564(b)(1) of the Act, 21 U.S.C. section 360bbb-3(b)(1), unless the authorization is terminated or revoked.  Performed at [facility], [address], [place], [place] [address]   Culture, blood (routine x 2)     Status: None (Preliminary result)   Collection Time: 02/03/21  2:25 PM    Specimen: BLOOD  Result Value Ref Range Status   Specimen Description BLOOD LEFT ANTECUBITAL  Final   Special Requests   Final    BOTTLES DRAWN AEROBIC AND ANAEROBIC Blood Culture results may not be optimal due to an inadequate volume of blood received in culture bottles   Culture   Final    NO GROWTH < 24 HOURS Performed at [facility], [address] [place], [place] [address]    Report Status PENDING  Incomplete  Culture, blood (routine x 2)     Status: None (Preliminary result)   Collection Time: 02/03/21  2:31 PM   Specimen: BLOOD LEFT HAND  Result Value Ref Range Status   Specimen Description BLOOD LEFT HAND  Final   Special Requests   Final    BOTTLES DRAWN AEROBIC ONLY Blood Culture adequate volume   Culture   Final    NO GROWTH < 24 HOURS Performed at [facility], [address] [place], [place] [address]    Report Status PENDING  Incomplete  SARS CORONAVIRUS 2 (TAT 6-24 HRS) Nasopharyngeal Nasopharyngeal Swab     Status: None   Collection Time: 02/03/21  5:03 PM   Specimen: Nasopharyngeal Swab  Result Value Ref Range Status   SARS Coronavirus 2 NEGATIVE NEGATIVE Final    Comment: (NOTE) SARS-CoV-2 target nucleic acids are NOT DETECTED.  The SARS-CoV-2 RNA is generally detectable in upper and lower respiratory specimens during the acute phase of infection. Negative results do not preclude SARS-CoV-2 infection, do not rule out co-infections with other pathogens, and should not be used as the sole basis for treatment or other patient management decisions. Negative results must be combined with clinical observations, patient history, and epidemiological information. The expected result is Negative.  Fact Sheet for Patients: HairSlick.no  Fact Sheet for Healthcare Providers:  quierodirigir.com  This test is not yet approved or cleared by the [place] and  has been authorized for detection  and/or diagnosis of SARS-CoV-2 by FDA under an Emergency Use Authorization (EUA). This EUA will remain  in effect (meaning this test can be used) for the duration of the COVID-19 declaration under Se ction 564(b)(1) of the Act, 21 U.S.C. section 360bbb-3(b)(1), unless the authorization is terminated or revoked sooner.  Performed at [facility], [address], [place], [place] [address]      Labs:  CBC: Recent Labs  Lab 01/29/21 [phone] 01/29/21 1705 01/30/21 0027 02/01/21 0258 02/02/21 0213 02/03/21 0106 02/04/21 0150  WBC 8.8   < > 7.1 8.5 8.1 10.3 8.8  NEUTROABS 7.3  --   --   --   --   --   --   HGB 15.0   < > 14.4 13.9 14.4 15.3 15.4  HCT 44.2   < > 41.2 40.9 41.8 44.5 44.1  MCV 100.2*   < > 96.7 98.1 97.4 97.6 96.5  PLT 164   < > 142* 178 195 225 218   < > = values in this interval not displayed.   BMP &GFR Recent Labs  Lab 01/30/21 0027 02/01/21 0258 02/02/21 0213 02/03/21 0106 02/04/21 0150  NA 136 138 137 136 135  K 3.8 3.8 4.0 4.4 4.3  CL 105 106 103 101 103  CO2 23 25 24 24 25   GLUCOSE 150* 108* 108* 122* 116*  BUN 17 13 19 20 18   CREATININE 1.05 0.99 0.85 0.95 0.91  CALCIUM 8.4* 8.5* 8.6* 8.8* 8.7*  MG 1.9  --   --   --   --    Estimated Creatinine Clearance: 71.2 mL/min (by C-G formula based on SCr of 0.91 mg/dL). Liver & Pancreas: Recent Labs  Lab 01/29/21 0859 01/30/21 0027  AST 19 19  ALT 18 19  ALKPHOS 50 43  BILITOT 1.2 0.9  PROT 6.6 5.4*  ALBUMIN 3.9 3.1*   No results for input(s): LIPASE, AMYLASE in the last 168 hours. Recent Labs  Lab 01/29/21 1705  AMMONIA 13   Diabetic: No results for input(s): HGBA1C in the last 72 hours. Recent Labs  Lab 01/29/21 0843 01/29/21 2105  GLUCAP 126* 103*   Cardiac Enzymes: No results for input(s): CKTOTAL, CKMB, CKMBINDEX, TROPONINI in the last 168 hours. No results for input(s): PROBNP in the last 8760 hours. Coagulation Profile: Recent Labs  Lab 01/29/21 0859  INR 1.1    Thyroid Function Tests: No results for input(s): TSH, T4TOTAL, FREET4, T3FREE, THYROIDAB in the last 72 hours. Lipid Profile: No results for input(s): CHOL, HDL, LDLCALC, TRIG, CHOLHDL, LDLDIRECT in the last 72 hours. Anemia Panel: No results for input(s): VITAMINB12, FOLATE, FERRITIN, TIBC, IRON, RETICCTPCT in the last 72 hours. Urine analysis:    Component Value Date/Time   COLORURINE YELLOW 01/29/2021 0845   APPEARANCEUR CLEAR 01/29/2021 0845   APPEARANCEUR Clear 01/28/2021 0836   LABSPEC >1.046 (H) 01/29/2021 0845   PHURINE 7.0 01/29/2021 0845   GLUCOSEU NEGATIVE 01/29/2021 0845   HGBUR NEGATIVE 01/29/2021 0845   BILIRUBINUR NEGATIVE 01/29/2021 0845   BILIRUBINUR Negative 01/28/2021 0836   KETONESUR NEGATIVE 01/29/2021 0845   PROTEINUR NEGATIVE 01/29/2021 0845   NITRITE NEGATIVE 01/29/2021 0845   LEUKOCYTESUR NEGATIVE 01/29/2021 0845   Sepsis Labs: Invalid input(s): PROCALCITONIN, LACTICIDVEN   Time coordinating discharge: 40 minutes  SIGNED:  03/31/2021, MD  [facility] 02/04/2021, 11:21 AM  If 7PM-7AM, please contact night-coverage [email or web address]

## 2021-02-04 NOTE — Progress Notes (Signed)
Discharge:   D/c to [facility] .  Report called in to [facility].  Transfer via EMS

## 2021-02-04 NOTE — TOC Transition Note (Signed)
Transition of Care [facility]) - CM/SW Discharge Note   Patient Details  Name: Adam Rowland MRN: [id number] Date of Birth: 1945-10-11  Transition of Care [facility] CM/SW Contact:  Eduard Roux, LCSW Phone Number: 02/04/2021, 12:30 PM   Clinical Narrative:     Patient will Discharge to: [facility]  Discharge Date: 02/04/2021 Family Notified: [name],spouse Transport By: Sharin Mons  Per MD patient is ready for discharge. RN, patient, and facility notified of discharge. Discharge Summary sent to facility. RN given number for [phone], room 3206. Ambulance transport requested for patient.   Clinical Social Worker signing off.  Antony Blackbird, MSW, LCSW Clinical Social Worker     Final next level of care: Skilled Nursing Facility Barriers to Discharge: Barriers Resolved   Patient Goals and CMS Choice        Discharge Placement              Patient chooses bed at: [facility] Patient to be transferred to facility by: [facility] Name of family member notified: spouse,[name] Patient and family notified of of transfer: 02/04/21  Discharge Plan and Services In-house Referral: Clinical Social Work                                   Social Determinants of Health (SDOH) Interventions     Readmission Risk Interventions No flowsheet data found.

## 2021-02-05 DIAGNOSIS — F0390 Unspecified dementia without behavioral disturbance: Secondary | ICD-10-CM | POA: Diagnosis not present

## 2021-02-05 DIAGNOSIS — E785 Hyperlipidemia, unspecified: Secondary | ICD-10-CM | POA: Diagnosis not present

## 2021-02-05 DIAGNOSIS — I10 Essential (primary) hypertension: Secondary | ICD-10-CM | POA: Diagnosis not present

## 2021-02-05 DIAGNOSIS — G9340 Encephalopathy, unspecified: Secondary | ICD-10-CM | POA: Diagnosis not present

## 2021-02-05 LAB — URINE CULTURE: Culture: <10000 — AB

## 2021-02-08 LAB — CULTURE, BLOOD (ROUTINE X 2)
Culture: NO GROWTH
Culture: NO GROWTH
Special Requests: ADEQUATE

## 2021-02-09 DIAGNOSIS — F0390 Unspecified dementia without behavioral disturbance: Secondary | ICD-10-CM | POA: Diagnosis not present

## 2021-02-09 DIAGNOSIS — I4891 Unspecified atrial fibrillation: Secondary | ICD-10-CM | POA: Diagnosis not present

## 2021-02-09 DIAGNOSIS — I10 Essential (primary) hypertension: Secondary | ICD-10-CM | POA: Diagnosis not present

## 2021-02-10 ENCOUNTER — Ambulatory Visit: Payer: Medicare Other | Admitting: Cardiology

## 2021-02-10 DIAGNOSIS — F0390 Unspecified dementia without behavioral disturbance: Secondary | ICD-10-CM | POA: Diagnosis not present

## 2021-02-10 DIAGNOSIS — G9340 Encephalopathy, unspecified: Secondary | ICD-10-CM | POA: Diagnosis not present

## 2021-02-10 DIAGNOSIS — E785 Hyperlipidemia, unspecified: Secondary | ICD-10-CM | POA: Diagnosis not present

## 2021-02-10 DIAGNOSIS — I10 Essential (primary) hypertension: Secondary | ICD-10-CM | POA: Diagnosis not present

## 2021-02-16 DIAGNOSIS — I4891 Unspecified atrial fibrillation: Secondary | ICD-10-CM | POA: Diagnosis not present

## 2021-02-16 DIAGNOSIS — N400 Enlarged prostate without lower urinary tract symptoms: Secondary | ICD-10-CM | POA: Diagnosis not present

## 2021-02-16 DIAGNOSIS — F0390 Unspecified dementia without behavioral disturbance: Secondary | ICD-10-CM | POA: Diagnosis not present

## 2021-02-16 DIAGNOSIS — I10 Essential (primary) hypertension: Secondary | ICD-10-CM | POA: Diagnosis not present

## 2021-02-19 DIAGNOSIS — N400 Enlarged prostate without lower urinary tract symptoms: Secondary | ICD-10-CM | POA: Diagnosis not present

## 2021-02-19 DIAGNOSIS — F0390 Unspecified dementia without behavioral disturbance: Secondary | ICD-10-CM | POA: Diagnosis not present

## 2021-02-19 DIAGNOSIS — I2510 Atherosclerotic heart disease of native coronary artery without angina pectoris: Secondary | ICD-10-CM | POA: Diagnosis not present

## 2021-02-19 DIAGNOSIS — I4891 Unspecified atrial fibrillation: Secondary | ICD-10-CM | POA: Diagnosis not present

## 2021-03-04 ENCOUNTER — Ambulatory Visit: Payer: Medicare Other | Admitting: Nurse Practitioner

## 2021-04-13 ENCOUNTER — Other Ambulatory Visit: Payer: Self-pay | Admitting: Nurse Practitioner

## 2021-04-28 DIAGNOSIS — D485 Neoplasm of uncertain behavior of skin: Secondary | ICD-10-CM | POA: Diagnosis not present

## 2021-04-28 DIAGNOSIS — L905 Scar conditions and fibrosis of skin: Secondary | ICD-10-CM | POA: Diagnosis not present

## 2021-04-28 DIAGNOSIS — L218 Other seborrheic dermatitis: Secondary | ICD-10-CM | POA: Diagnosis not present

## 2021-04-28 DIAGNOSIS — Z85828 Personal history of other malignant neoplasm of skin: Secondary | ICD-10-CM | POA: Diagnosis not present

## 2021-05-13 DIAGNOSIS — H5203 Hypermetropia, bilateral: Secondary | ICD-10-CM | POA: Diagnosis not present

## 2021-05-27 NOTE — Progress Notes (Deleted)
Cardiology Office Note  Date: 05/27/2021   ID: Adam Rowland, DOB [birth year], MRN [id number]  PCP:  Bennie Pierini, FNP  Cardiologist:  None Electrophysiologist:  None   Chief Complaint: 1 year follow-up  History of Present Illness: Adam Rowland is a 75 y.o. male with a history of CAD/NSTEMI, ischemic cardiomyopathy, HLD, HTN, NSVT, PAF, dementia, BPH  He was last seen by Dr. Purvis Sheffield on 07/29/2019 for overdue follow-up.  History of NSTEMI October 2015.  Stent placement to LAD.  Significant residual disease of LCx and first OM.  At that time it was felt a staged approach to additional intervention would be appropriate once he fully recovered from his MI.  Had an ischemic cardiomyopathy EF 45 to 50%.  He was active doing yard work.  He denied any anginal symptoms, palpitations, shortness of breath, lightheadedness, dizziness, leg swelling, orthopnea, PND, syncope.  He was continuing aspirin, lisinopril, Lipitor and metoprolol.  Prasugrel was discontinued at that visit.  Blood pressure is well controlled on lisinopril 20 mg daily.  There were no changes.  LDL was at goal at 54.  He was continuing Lipitor 40 mg daily.  His ischemic cardiomyopathy was symptomatically stable with no evidence of heart failure.  There were no changes to therapy which included ACE inhibitor and beta-blocker.  Plan was obtain a follow-up echocardiogram.   He had a recent presentation on 01/29/2021 with acute mental status change with slurred speech and ambulatory dysfunction.  He had recently been started on Flomax.  He had an extensive evaluation without clear explanation for symptoms.  Neurology was consulted.  All labs and diagnostic testing were unrevealing.  EEG with nonspecific cortical dysfunction arising from left temporal region and moderate diffuse encephalopathy.  Neurology recommended discontinuation of Flomax and Namenda.  He had a short episode of atrial flutter and converted back to normal sinus  rhythm.  His wife stated he had a history of atrial fibrillation but not on anticoagulation likely due to fall risk and low afibrillation burden.  He was discharged on Seroquel 25 mg nightly and continuing Aricept.  He was to minimize sedating medications.  His EKG initially showed controlled a flutter but converted to sinus rhythm later on.  He was continue home metoprolol for rate control.  Continue lisinopril for hypertension.  Continue statin for hyperlipidemia.  Continuing Aricept for dementia.  Continuing aspirin, beta-blocker, statin.   Past Medical History:  Diagnosis Date   CAD (coronary artery disease)    a. 05/2014 NSTEMI/PCI: LM nl, LAD 71m (3.0x23 Xience Alpine DES), D1 90, LCX 47m, OM1 80, RCA 30p/d, RPDA 40-50p, EF 40-45% ->Plan for staged PCI of LCX/OM1.   Cardiomyopathy, ischemic    a. 05/2014 Echo: Ef 45-50%, mid-apicalanteroseptal AK, Gr 2 DD.   Cataract    Fracture, ankle 06/12/2014   Hyperlipidemia    Hypertension    Myocardial infarction [facility]) 2015   non STEMI   Nephrolithiasis 01/16/2013   NSVT (nonsustained ventricular tachycardia) (HCC)    a. 05/2014 in setting of NSTEMI -  asymptomatic.    Past Surgical History:  Procedure Laterality Date   ANKLE FRACTURE SURGERY     CARDIAC CATHETERIZATION     CARDIAC CATHETERIZATION Right 06/20/2014   Procedure: CORONARY STENT INTERVENTION;  Surgeon: Peter M Swaziland, MD;  Location: [facility] CATH LAB;  Service: Cardiovascular;  Laterality: Right;   CATARACT EXTRACTION Bilateral    CORONARY STENT PLACEMENT     LEFT HEART CATHETERIZATION WITH CORONARY ANGIOGRAM N/A 06/20/2014  Procedure: LEFT HEART CATHETERIZATION WITH CORONARY ANGIOGRAM;  Surgeon: Peter M Swaziland, MD;  Location: [facility] CATH LAB;  Service: Cardiovascular;  Laterality: N/A;   NASAL SEPTUM SURGERY     VASECTOMY      Current Outpatient Medications  Medication Sig Dispense Refill   aspirin EC 81 MG EC tablet Take 1 tablet (81 mg total) by mouth daily.     atorvastatin  (LIPITOR) 40 MG tablet TAKE 1 TABLET ONCE DAILY AT BEDTIME 90 tablet 1   donepezil (ARICEPT) 10 MG tablet Take 1 tablet (10 mg total) by mouth at bedtime. 90 tablet 3   lisinopril (ZESTRIL) 20 MG tablet Take 1 tablet (20 mg total) by mouth daily. 90 tablet 1   metoprolol tartrate (LOPRESSOR) 25 MG tablet Take 1 tablet (25 mg total) by mouth 2 (two) times daily. 180 tablet 1   nitroGLYCERIN (NITROSTAT) 0.4 MG SL tablet Place 1 tablet (0.4 mg total) under the tongue every 5 (five) minutes x 3 doses as needed for chest pain. 25 tablet 3   polyethylene glycol powder (MIRALAX) 17 GM/SCOOP powder Take 17 g by mouth 2 (two) times daily as needed for moderate constipation. 255 g 0   QUEtiapine (SEROQUEL) 25 MG tablet Take 1 tablet (25 mg total) by mouth at bedtime. 90 tablet 1   senna-docusate (SENOKOT-S) 8.6-50 MG tablet Take 1 tablet by mouth 2 (two) times daily between meals as needed for mild constipation. 60 tablet 0   No current facility-administered medications for this visit.   Allergies:  Patient has no known allergies.   Social History: The patient  reports that he quit smoking about 33 years ago. His smoking use included cigarettes. He started smoking about 56 years ago. He has a 23.00 pack-year smoking history. He quit smokeless tobacco use about 33 years ago.  His smokeless tobacco use included chew. He reports current alcohol use of about 7.0 standard drinks per week. He reports that he does not use drugs.   Family History: The patient's family history includes Alcohol abuse in his father; Cancer in his brother and father; Hyperlipidemia in his father and sister; Hypertension in his mother; Nephrolithiasis in his brother.   ROS:  Please see the history of present illness. Otherwise, complete review of systems is positive for {NONE DEFAULTED:18576}.  All other systems are reviewed and negative.   Physical Exam: VS:  There were no vitals taken for this visit., BMI There is no height or weight  on file to calculate BMI.  Wt Readings from Last 3 Encounters:  02/03/21 168 lb 14 oz (76.6 kg)  01/28/21 165 lb (74.8 kg)  12/30/20 168 lb (76.2 kg)    General: Patient appears comfortable at rest. HEENT: Conjunctiva and lids normal, oropharynx clear with moist mucosa. Neck: Supple, no elevated JVP or carotid bruits, no thyromegaly. Lungs: Clear to auscultation, nonlabored breathing at rest. Cardiac: Regular rate and rhythm, no S3 or significant systolic murmur, no pericardial rub. Abdomen: Soft, nontender, no hepatomegaly, bowel sounds present, no guarding or rebound. Extremities: No pitting edema, distal pulses 2+. Skin: Warm and dry. Musculoskeletal: No kyphosis. Neuropsychiatric: Alert and oriented x3, affect grossly appropriate.  ECG:  {EKG/Telemetry Strips Reviewed:[id number]}  Recent Labwork: 01/29/2021: TSH 1.321 01/30/2021: ALT 19; AST 19; Magnesium 1.9 02/04/2021: BUN 18; Creatinine, Ser 0.91; Hemoglobin 15.4; Platelets 218; Potassium 4.3; Sodium 135     Component Value Date/Time   CHOL 116 01/28/2021 0923   CHOL 171 01/16/2013 0845   TRIG 77 01/28/2021 0923  TRIG 134 10/23/2014 0956   TRIG 176 (H) 01/16/2013 0845   HDL 42 01/28/2021 0923   HDL 42 10/23/2014 0956   HDL 31 (L) 01/16/2013 0845   CHOLHDL 2.8 01/28/2021 0923   CHOLHDL 3.2 06/20/2014 0210   VLDL 20 06/20/2014 0210   LDLCALC 58 01/28/2021 0923   LDLCALC 83 01/16/2014 1054   LDLCALC 105 (H) 01/16/2013 0845    Other Studies Reviewed Today:   Echocardiogram 01/30/2021  1. Left ventricular ejection fraction, by estimation, is 55 to 60%. The left ventricle has normal function. The left ventricle has no regional Winchell motion abnormalities. There is mild asymmetric left ventricular hypertrophy of the septal segment. Left ventricular diastolic parameters are indeterminate in the setting of atrial flutter. 2. Right ventricular systolic function is low normal. The right ventricular size is normal. Tricuspid  regurgitation signal is inadequate for assessing PA pressure. 3. The mitral valve is grossly normal. Trivial mitral valve regurgitation. 4. The aortic valve is tricuspid. Aortic valve regurgitation is not visualized. 5. The inferior vena cava is normal in size with greater than 50% respiratory variability, suggesting right atrial pressure of 3 mmHg.   CT of the head without contrast 01/29/2021 IMPRESSION: 1. Normal for age non contrast CT appearance of the brain. 2. This was discussed by telephone with Dr. Jacqulyn Bath in the ED on 01/29/2021 at 09:14 . 3. Chronic paranasal sinus disease, prior sinus surgery.  CT angio head and neck 01/29/2021 IMPRESSION: No large vessel occlusion in the neck. No stenosis at the ICA origins. Calcified plaque at the right vertebral artery origin causing at least moderate stenosis.   No proximal intracranial vessel occlusion or significant stenosis.   Perfusion imaging demonstrates no evidence of core infarction or penumbra.   MRI brain 01/29/2021 IMPRESSION: Mildly motion degraded exam.   No evidence of acute intracranial abnormality.   Mild generalized parenchymal atrophy.   Mild paranasal sinus mucosal thickening, as described.  Assessment and Plan:  1. CAD in native artery   2. Essential hypertension   3. Cardiomyopathy, ischemic   4. Hyperlipidemia LDL goal <70    1. CAD in native artery ***  2. Essential hypertension ***  3. Cardiomyopathy, ischemic ***  4. Hyperlipidemia LDL goal <70 ***   Medication Adjustments/Labs and Tests Ordered: Current medicines are reviewed at length with the patient today.  Concerns regarding medicines are outlined above.   Disposition: Follow-up with ***  Signed, Rennis Harding, NP 05/27/2021 1:10 PM    [facility] at [facility] [address] [place], [place], [place] [address] Phone: [phone]; Fax: [phone]

## 2021-05-28 ENCOUNTER — Ambulatory Visit: Payer: Medicare Other | Admitting: Family Medicine

## 2021-06-15 ENCOUNTER — Ambulatory Visit (INDEPENDENT_AMBULATORY_CARE_PROVIDER_SITE_OTHER): Payer: Medicare Other

## 2021-06-15 VITALS — Ht 69.0 in | Wt 165.0 lb

## 2021-06-15 DIAGNOSIS — Z0000 Encounter for general adult medical examination without abnormal findings: Secondary | ICD-10-CM | POA: Diagnosis not present

## 2021-06-15 NOTE — Patient Instructions (Signed)
[name] , Thank you for taking time to come for your Medicare Wellness Visit. I appreciate your ongoing commitment to your health goals. Please review the following plan we discussed and let me know if I can assist you in the future.   Screening recommendations/referrals: Colonoscopy: Done 11/29/2012 - Repeat in 10 years Recommended yearly ophthalmology/optometry visit for glaucoma screening and checkup Recommended yearly dental visit for hygiene and checkup  Vaccinations: Influenza vaccine: Done 05/2021 - Repeat in 10 years Pneumococcal vaccine: Done 12/12/2012 & 02/16/2015 Tdap vaccine: Done 12/12/2012 - Repeat in 10 years Shingles vaccine: Done 06/13/2018 & 01/29/2021   Covid-19: Done 09/18/2019, 10/09/2019, & booster - we need date - appointment for next booster 06/29/2021  Advanced directives: Please bring a copy of your health care power of attorney and living will to the office to be added to your chart at your convenience.   Conditions/risks identified: Aim for 30 minutes of exercise or brisk walking each day, drink 6-8 glasses of water and eat lots of fruits and vegetables.   Next appointment: Follow up in one year for your annual wellness visit.   Preventive Care 46 Years and Older, Male  Preventive care refers to lifestyle choices and visits with your health care provider that can promote health and wellness. What does preventive care include? A yearly physical exam. This is also called an annual well check. Dental exams once or twice a year. Routine eye exams. Ask your health care provider how often you should have your eyes checked. Personal lifestyle choices, including: Daily care of your teeth and gums. Regular physical activity. Eating a healthy diet. Avoiding tobacco and drug use. Limiting alcohol use. Practicing safe sex. Taking low doses of aspirin every day. Taking vitamin and mineral supplements as recommended by your health care provider. What happens during an  annual well check? The services and screenings done by your health care provider during your annual well check will depend on your age, overall health, lifestyle risk factors, and family history of disease. Counseling  Your health care provider may ask you questions about your: Alcohol use. Tobacco use. Drug use. Emotional well-being. Home and relationship well-being. Sexual activity. Eating habits. History of falls. Memory and ability to understand (cognition). Work and work Astronomer. Screening  You may have the following tests or measurements: Height, weight, and BMI. Blood pressure. Lipid and cholesterol levels. These may be checked every 5 years, or more frequently if you are over 47 years old. Skin check. Lung cancer screening. You may have this screening every year starting at age 59 if you have a 30-pack-year history of smoking and currently smoke or have quit within the past 15 years. Fecal occult blood test (FOBT) of the stool. You may have this test every year starting at age 29. Flexible sigmoidoscopy or colonoscopy. You may have a sigmoidoscopy every 5 years or a colonoscopy every 10 years starting at age 2. Prostate cancer screening. Recommendations will vary depending on your family history and other risks. Hepatitis C blood test. Hepatitis B blood test. Sexually transmitted disease (STD) testing. Diabetes screening. This is done by checking your blood sugar (glucose) after you have not eaten for a while (fasting). You may have this done every 1-3 years. Abdominal aortic aneurysm (AAA) screening. You may need this if you are a current or former smoker. Osteoporosis. You may be screened starting at age 54 if you are at high risk. Talk with your health care provider about your test results, treatment options,  and if necessary, the need for more tests. Vaccines  Your health care provider may recommend certain vaccines, such as: Influenza vaccine. This is recommended  every year. Tetanus, diphtheria, and acellular pertussis (Tdap, Td) vaccine. You may need a Td booster every 10 years. Zoster vaccine. You may need this after age 52. Pneumococcal 13-valent conjugate (PCV13) vaccine. One dose is recommended after age 38. Pneumococcal polysaccharide (PPSV23) vaccine. One dose is recommended after age 22. Talk to your health care provider about which screenings and vaccines you need and how often you need them. This information is not intended to replace advice given to you by your health care provider. Make sure you discuss any questions you have with your health care provider. Document Released: 09/11/2015 Document Revised: 05/04/2016 Document Reviewed: 06/16/2015 Elsevier Interactive Patient Education  2017 [place] Prevention in the Home Falls can cause injuries. They can happen to people of all ages. There are many things you can do to make your home safe and to help prevent falls. What can I do on the outside of my home? Regularly fix the edges of walkways and driveways and fix any cracks. Remove anything that might make you trip as you walk through a door, such as a raised step or threshold. Trim any bushes or trees on the path to your home. Use bright outdoor lighting. Clear any walking paths of anything that might make someone trip, such as rocks or tools. Regularly check to see if handrails are loose or broken. Make sure that both sides of any steps have handrails. Any raised decks and porches should have guardrails on the edges. Have any leaves, snow, or ice cleared regularly. Use sand or salt on walking paths during winter. Clean up any spills in your garage right away. This includes oil or grease spills. What can I do in the bathroom? Use night lights. Install grab bars by the toilet and in the tub and shower. Do not use towel bars as grab bars. Use non-skid mats or decals in the tub or shower. If you need to sit down in the shower,  use a plastic, non-slip stool. Keep the floor dry. Clean up any water that spills on the floor as soon as it happens. Remove soap buildup in the tub or shower regularly. Attach bath mats securely with double-sided non-slip rug tape. Do not have throw rugs and other things on the floor that can make you trip. What can I do in the bedroom? Use night lights. Make sure that you have a light by your bed that is easy to reach. Do not use any sheets or blankets that are too big for your bed. They should not hang down onto the floor. Have a firm chair that has side arms. You can use this for support while you get dressed. Do not have throw rugs and other things on the floor that can make you trip. What can I do in the kitchen? Clean up any spills right away. Avoid walking on wet floors. Keep items that you use a lot in easy-to-reach places. If you need to reach something above you, use a strong step stool that has a grab bar. Keep electrical cords out of the way. Do not use floor polish or wax that makes floors slippery. If you must use wax, use non-skid floor wax. Do not have throw rugs and other things on the floor that can make you trip. What can I do with my stairs? Do not leave  any items on the stairs. Make sure that there are handrails on both sides of the stairs and use them. Fix handrails that are broken or loose. Make sure that handrails are as long as the stairways. Check any carpeting to make sure that it is firmly attached to the stairs. Fix any carpet that is loose or worn. Avoid having throw rugs at the top or bottom of the stairs. If you do have throw rugs, attach them to the floor with carpet tape. Make sure that you have a light switch at the top of the stairs and the bottom of the stairs. If you do not have them, ask someone to add them for you. What else can I do to help prevent falls? Wear shoes that: Do not have high heels. Have rubber bottoms. Are comfortable and fit you  well. Are closed at the toe. Do not wear sandals. If you use a stepladder: Make sure that it is fully opened. Do not climb a closed stepladder. Make sure that both sides of the stepladder are locked into place. Ask someone to hold it for you, if possible. Clearly mark and make sure that you can see: Any grab bars or handrails. First and last steps. Where the edge of each step is. Use tools that help you move around (mobility aids) if they are needed. These include: Canes. Walkers. Scooters. Crutches. Turn on the lights when you go into a dark area. Replace any light bulbs as soon as they burn out. Set up your furniture so you have a clear path. Avoid moving your furniture around. If any of your floors are uneven, fix them. If there are any pets around you, be aware of where they are. Review your medicines with your doctor. Some medicines can make you feel dizzy. This can increase your chance of falling. Ask your doctor what other things that you can do to help prevent falls. This information is not intended to replace advice given to you by your health care provider. Make sure you discuss any questions you have with your health care provider. Document Released: 06/11/2009 Document Revised: 01/21/2016 Document Reviewed: 09/19/2014 Elsevier Interactive Patient Education  2017 [employer].

## 2021-06-15 NOTE — Progress Notes (Signed)
Subjective:   Adam Rowland is a 75 y.o. male who presents for Medicare Annual/Subsequent preventive examination.  Virtual Visit via Telephone Note  I connected with  Adam Rowland on 06/15/21 at  8:15 AM EDT by telephone and verified that I am speaking with the correct person using two identifiers.  Location: Patient: Home Provider: [facility] Persons participating in the virtual visit: patient/wife, Adam Rowland/Nurse Health Advisor   I discussed the limitations, risks, security and privacy concerns of performing an evaluation and management service by telephone and the availability of in person appointments. The patient expressed understanding and agreed to proceed.  Interactive audio and video telecommunications were attempted between this nurse and patient, however failed, due to patient having technical difficulties OR patient did not have access to video capability.  We continued and completed visit with audio only.  Some vital signs may be absent or patient reported.   Adam Mittman E Sahil Milner, LPN   Review of Systems     Cardiac Risk Factors include: advanced age (>49men, >23 women);dyslipidemia;hypertension;male gender;Other (see comment), Risk factor comments: hx of MI, A.Fib, CAD     Objective:    Today's Vitals   06/15/21 0822  Weight: 165 lb (74.8 kg)  Height: 5\' 9"  (1.753 m)   Body mass index is 24.37 kg/m.  Advanced Directives 06/15/2021 01/29/2021 06/10/2020 06/10/2019 06/07/2018 06/06/2017 06/02/2016  Does Patient Have a Medical Advance Directive? Yes No Yes Yes Yes Yes Yes  Type of 08/02/2016 of [place];Living will - Living will Healthcare Power of [place];Living will Living will;Healthcare Power of Attorney Living will Healthcare Power of [place];Living will  Does patient want to make changes to medical advance directive? - - No - Patient declined No - Patient declined No - Patient declined No - Patient declined No - Patient declined  Copy of Healthcare Power  of Attorney in Chart? No - copy requested - - No - copy requested No - copy requested No - copy requested No - copy requested  Would patient like information on creating a medical advance directive? - No - Patient declined - - - - -    Current Medications (verified) Outpatient Encounter Medications as of 06/15/2021  Medication Sig   aspirin EC 81 MG EC tablet Take 1 tablet (81 mg total) by mouth daily.   atorvastatin (LIPITOR) 40 MG tablet TAKE 1 TABLET ONCE DAILY AT BEDTIME   donepezil (ARICEPT) 10 MG tablet Take 1 tablet (10 mg total) by mouth at bedtime.   lisinopril (ZESTRIL) 20 MG tablet Take 1 tablet (20 mg total) by mouth daily.   metoprolol tartrate (LOPRESSOR) 25 MG tablet Take 1 tablet (25 mg total) by mouth 2 (two) times daily.   nitroGLYCERIN (NITROSTAT) 0.4 MG SL tablet Place 1 tablet (0.4 mg total) under the tongue every 5 (five) minutes x 3 doses as needed for chest pain.   polyethylene glycol powder (MIRALAX) 17 GM/SCOOP powder Take 17 g by mouth 2 (two) times daily as needed for moderate constipation.   QUEtiapine (SEROQUEL) 25 MG tablet Take 1 tablet (25 mg total) by mouth at bedtime.   senna-docusate (SENOKOT-S) 8.6-50 MG tablet Take 1 tablet by mouth 2 (two) times daily between meals as needed for mild constipation.   No facility-administered encounter medications on file as of 06/15/2021.    Allergies (verified) Patient has no known allergies.   History: Past Medical History:  Diagnosis Date   CAD (coronary artery disease)    a. 05/2014 NSTEMI/PCI: LM nl,  LAD 3m (3.0x23 Xience Alpine DES), D1 90, LCX 14m, OM1 80, RCA 30p/d, RPDA 40-50p, EF 40-45% ->Plan for staged PCI of LCX/OM1.   Cardiomyopathy, ischemic    a. 05/2014 Echo: Ef 45-50%, mid-apicalanteroseptal AK, Gr 2 DD.   Cataract    Fracture, ankle 06/12/2014   Hyperlipidemia    Hypertension    Myocardial infarction [facility]) 2015   non STEMI   Nephrolithiasis 01/16/2013   NSVT (nonsustained ventricular  tachycardia)    a. 05/2014 in setting of NSTEMI -  asymptomatic.   Past Surgical History:  Procedure Laterality Date   ANKLE FRACTURE SURGERY     CARDIAC CATHETERIZATION     CARDIAC CATHETERIZATION Right 06/20/2014   Procedure: CORONARY STENT INTERVENTION;  Surgeon: Peter M Swaziland, MD;  Location: [facility] CATH LAB;  Service: Cardiovascular;  Laterality: Right;   CATARACT EXTRACTION Bilateral    CORONARY STENT PLACEMENT     LEFT HEART CATHETERIZATION WITH CORONARY ANGIOGRAM N/A 06/20/2014   Procedure: LEFT HEART CATHETERIZATION WITH CORONARY ANGIOGRAM;  Surgeon: Peter M Swaziland, MD;  Location: [facility] CATH LAB;  Service: Cardiovascular;  Laterality: N/A;   NASAL SEPTUM SURGERY     VASECTOMY     Family History  Problem Relation Age of Onset   Hypertension Mother    Cancer Father        bladder ca   Hyperlipidemia Father    Alcohol abuse Father    Hyperlipidemia Sister    Cancer Brother    Nephrolithiasis Brother    Colon cancer Neg Hx    Stomach cancer Neg Hx    Esophageal cancer Neg Hx    Social History   Socioeconomic History   Marital status: Married    Spouse name: [name]   Number of children: 1   Years of education: 16   Highest education level: Bachelor's degree (e.g., BA, AB, BS)  Occupational History   Occupation: Retired  Tobacco Use   Smoking status: Former    Packs/day: 1.00    Years: 23.00    Pack years: 23.00    Types: Cigarettes    Start date: 10/26/1964    Quit date: 08/30/1987    Years since quitting: 33.8   Smokeless tobacco: Former    Types: Chew    Quit date: 02/27/1988   Tobacco comments:    quit in 1989, smoked since he was a teenager/chewed tobacco for about 6 months after he quit smoking cigarettes  Vaping Use   Vaping Use: Never used  Substance and Sexual Activity   Alcohol use: Yes    Alcohol/week: 7.0 standard drinks    Types: 7 Glasses of wine per week    Comment: 1 glass of wine nightly    Drug use: No   Sexual activity: Not Currently  Other  Topics Concern   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 He has one son and one granddaughter - they live nearby.   Social Determinants of Health   Financial Resource Strain: Low Risk    Difficulty of Paying Living Expenses: Not hard at all  Food Insecurity: No Food Insecurity   Worried About Programme researcher, broadcasting/film/video in the Last Year: Never true   Ran Out of Food in the Last Year: Never true  Transportation Needs: No Transportation Needs   Lack of Transportation (Medical): No   Lack of Transportation (Non-Medical): No  Physical Activity: Insufficiently Active   Days of Exercise per Week: 4 days  Minutes of Exercise per Session: 30 min  Stress: No Stress Concern Present   Feeling of Stress : Not at all  Social Connections: Socially Integrated   Frequency of Communication with Friends and Family: More than three times a week   Frequency of Social Gatherings with Friends and Family: More than three times a week   Attends Religious Services: More than 4 times per year   Active Member of [employer] or Organizations: Yes   Attends Engineer, structural: More than 4 times per year   Marital Status: Married    Tobacco Counseling Counseling given: Not Answered Tobacco comments: quit in 1989, smoked since he was a teenager/chewed tobacco for about 6 months after he quit smoking cigarettes   Clinical Intake:  Pre-visit preparation completed: Yes  Pain : No/denies pain     BMI - recorded: 24.37 Nutritional Status: BMI of 19-24  Normal Nutritional Risks: None Diabetes: No  How often do you need to have someone help you when you read instructions, pamphlets, or other written materials from your doctor or pharmacy?: 1 - Never  Diabetic? No  Interpreter Needed?: No  Information entered by :: Kyrra Prada, LPN   Activities of Daily Living In your present state of health, do you have any difficulty performing the following activities:  06/15/2021 01/30/2021  Hearing? N Y  Vision? N N  Difficulty concentrating or making decisions? Malvin Johns  Walking or climbing stairs? N Y  Dressing or bathing? N Y  Doing errands, shopping? Malvin Johns  Preparing Food and eating ? N -  Using the Toilet? N -  In the past six months, have you accidently leaked urine? N -  Do you have problems with loss of bowel control? N -  Managing your Medications? N -  Managing your Finances? Y -  Housekeeping or managing your Housekeeping? N -  Some recent data might be hidden    Patient Care Team: Bennie Pierini, FNP as PCP - General (Nurse Practitioner) Tora Duck, PA-C as Consulting Physician (Physician Assistant) Derryl Harbor, OD as Consulting Physician (Optometry) Stephannie Li, MD as Consulting Physician (Ophthalmology)  Indicate any recent Medical Services you may have received from other than [facility] providers in the past year (date may be approximate).     Assessment:   This is a routine wellness examination for [place].  Hearing/Vision screen Hearing Screening - Comments:: Denies hearing difficulties  Vision Screening - Comments:: Wears rx glasses - up to date with [facility]  Dietary issues and exercise activities discussed: Current Exercise Habits: Home exercise routine, Type of exercise: walking;Other - see comments (stationary bike), Time (Minutes): 30, Frequency (Times/Week): 4, Weekly Exercise (Minutes/Week): 120, Intensity: Mild, Exercise limited by: cardiac condition(s);psychological condition(s)   Goals Addressed   None    Depression Screen PHQ 2/9 Scores 06/15/2021 01/28/2021 07/29/2020 01/28/2020 07/30/2019 06/10/2019 01/28/2019  PHQ - 2 Score 0 0 0 0 0 0 0    Fall Risk Fall Risk  06/15/2021 01/28/2021 07/29/2020 06/10/2020 01/28/2020  Falls in the past year? 0 0 0 0 0  Number falls in past yr: 0 - - - -  Injury with Fall? 0 - - - -  Comment - - - - -  Risk Factor Category  - - - - -  Risk for fall due  to : Mental status change - - - -  Follow up Falls prevention discussed - - - -  Comment - - - - -  FALL RISK PREVENTION PERTAINING TO THE HOME:  Any stairs in or around the home? Yes  If so, are there any without handrails? No  Home free of loose throw rugs in walkways, pet beds, electrical cords, etc? Yes  Adequate lighting in your home to reduce risk of falls? Yes   ASSISTIVE DEVICES UTILIZED TO PREVENT FALLS:  Life alert? No  Use of a cane, walker or w/c? No  Grab bars in the bathroom? Yes  Shower chair or bench in shower? No  Elevated toilet seat or a handicapped toilet? Yes   TIMED UP AND GO:  Was the test performed? No . Telephonic visit  Cognitive Function: Cognitive status assessed by direct observation. Patient has current diagnosis of cognitive impairment. Patient is followed by neurology for ongoing assessment. Patient is unable to complete screening 6CIT or MMSE.   MMSE - Mini Mental State Exam 12/30/2020 06/10/2020 12/30/2019 07/01/2019 03/07/2019  Not completed: - Refused - - -  Orientation to time 0 - 3 4 4   Orientation to time comments - - - - -  Orientation to Place 3 - 4 3 4   Registration 3 - 3 2 3   Attention/ Calculation 1 - 1 0 3  Attention/Calculation-comments - - - - -  Recall 2 - 1 2 1   Language- name 2 objects 2 - 2 2 2   Language- repeat 1 - 1 1 1   Language- follow 3 step command 2 - 3 3 3   Language- read & follow direction 1 - 1 1 1   Language-read & follow direction-comments - - - - -  Write a sentence 0 - 1 1 1   Copy design 0 - 1 1 0  Copy design-comments - - - named 4 animals -  Total score 15 - 21 20 23      6CIT Screen 06/10/2019  What Year? 0 points  What month? 0 points  What time? 0 points  Count back from 20 0 points  Months in reverse 0 points  Repeat phrase 0 points  Total Score 0    Immunizations Immunization History  Administered Date(s) Administered   Fluad Quad(high Dose 65+) 06/20/2019   Influenza Split 10/08/2012,  07/09/2013   Influenza, High Dose Seasonal PF 06/02/2016, 07/05/2017, 06/13/2018   Influenza,inj,Quad PF,6+ Mos 06/21/2014, 05/27/2015   Influenza-Unspecified 06/15/2020, 06/01/2021   PFIZER(Purple Top)SARS-COV-2 Vaccination 09/18/2019, 10/09/2019, 06/17/2020   Pneumococcal Conjugate-13 02/16/2015   Pneumococcal Polysaccharide-23 12/12/2012   Tdap 12/12/2012   Zoster Recombinat (Shingrix) 06/13/2018, 01/29/2021   Zoster, Live 10/08/2012    TDAP status: Up to date  Flu Vaccine status: Up to date  Pneumococcal vaccine status: Up to date  Covid-19 vaccine status: Completed vaccines  Qualifies for Shingles Vaccine? Yes   Zostavax completed Yes   Shingrix Completed?: Yes  Screening Tests Health Maintenance  Topic Date Due   COVID-19 Vaccine (4 - Booster for Pfizer series) 10/18/2020   COLONOSCOPY (Pts 45-21yrs Insurance coverage will need to be confirmed)  11/30/2022   TETANUS/TDAP  12/13/2022   INFLUENZA VACCINE  Completed   Hepatitis C Screening  Completed   Zoster Vaccines- Shingrix  Completed   HPV VACCINES  Aged Out    Health Maintenance  Health Maintenance Due  Topic Date Due   COVID-19 Vaccine (4 - Booster for Pfizer series) 10/18/2020    Colorectal cancer screening: Type of screening: Colonoscopy. Completed 11/29/2012. Repeat every 10 years  Lung Cancer Screening: (Low Dose CT Chest recommended if Age 30-80 years, 30 pack-year currently smoking OR  have quit w/in 15years.) does not qualify.   Additional Screening:  Hepatitis C Screening: does qualify; Completed 05/21/2015  Vision Screening: Recommended annual ophthalmology exams for early detection of glaucoma and other disorders of the eye. Is the patient up to date with their annual eye exam?  Yes  Who is the provider or what is the name of the office in which the patient attends annual eye exams? [facility] If pt is not established with a provider, would they like to be referred to a provider to establish  care? No .   Dental Screening: Recommended annual dental exams for proper oral hygiene  Community Resource Referral / Chronic Care Management: CRR required this visit?  No   CCM required this visit?  No      Plan:     I have personally reviewed and noted the following in the patient's chart:   Medical and social history Use of alcohol, tobacco or illicit drugs  Current medications and supplements including opioid prescriptions. Patient is not currently taking opioid prescriptions. Functional ability and status Nutritional status Physical activity Advanced directives List of other physicians Hospitalizations, surgeries, and ER visits in previous 12 months Vitals Screenings to include cognitive, depression, and falls Referrals and appointments  In addition, I have reviewed and discussed with patient certain preventive protocols, quality metrics, and best practice recommendations. A written personalized care plan for preventive services as well as general preventive health recommendations were provided to patient.     [place] Constable, LPN   [id number]   Nurse Notes: None

## 2021-06-16 NOTE — Progress Notes (Signed)
Cardiology Office Note  Date: 06/17/2021   ID: Adam Rowland, DOB 1946-04-14, MRN [id number]  PCP:  Bennie Pierini, FNP  Cardiologist:  None Electrophysiologist:  None   Chief Complaint: 1 year follow-up  History of Present Illness: Adam Rowland is a 75 y.o. male with a history of CAD/NSTEMI, ischemic cardiomyopathy, HLD, HTN, NSVT, PAF, dementia, BPH  He was last seen by Dr. Purvis Sheffield on 07/29/2019 for overdue follow-up.  History of NSTEMI October 2015.  Stent placement to LAD.  Significant residual disease of LCx and first OM.  At that time it was felt a staged approach to additional intervention would be appropriate once he fully recovered from his MI.  Had an ischemic cardiomyopathy EF 45 to 50%.  He was active doing yard work.  He denied any anginal symptoms, palpitations, shortness of breath, lightheadedness, dizziness, leg swelling, orthopnea, PND, syncope.  He was continuing aspirin, lisinopril, Lipitor and metoprolol.  Prasugrel was discontinued at that visit.  Blood pressure was well controlled on lisinopril 20 mg daily.  There were no changes.  LDL was at goal at 54.  He was continuing Lipitor 40 mg daily.  His ischemic cardiomyopathy was symptomatically stable with no evidence of heart failure.  There were no changes to therapy which included ACE inhibitor and beta-blocker.  Plan was obtain a follow-up echocardiogram.   He had a recent presentation on 01/29/2021 with acute mental status change with slurred speech and ambulatory dysfunction.  He had recently been started on Flomax.  He had an extensive evaluation without clear explanation for symptoms.  Neurology was consulted.  All labs and diagnostic testing were unrevealing.  EEG with nonspecific cortical dysfunction arising from left temporal region and moderate diffuse encephalopathy.  Neurology recommended discontinuation of Flomax and Namenda.  He had a short episode of atrial flutter and converted back to normal sinus  rhythm.  His wife stated he had a history of atrial fibrillation but not on anticoagulation likely due to fall risk and low afibrillation burden.  He was discharged on Seroquel 25 mg nightly and continuing Aricept.  He was to minimize sedating medications.  His EKG initially showed controlled a flutter but converted to sinus rhythm later on.  He was continue home metoprolol for rate control.  Continue lisinopril for hypertension.  Continue statin for hyperlipidemia.  Continuing Aricept for dementia.  Continuing aspirin, beta-blocker, statin.  He is here for 1 year follow-up today.  He denies any other issues with altered mental status since stopping Flomax and Namenda.  No other episodes of atrial flutter.  He denies any anginal symptoms.  No SOB or DOE.  No PND, orthopnea no orthostatic symptoms, no CVA or TIA-like symptoms.  No bleeding on aspirin 81 mg.  Not on Eliquis due to risk for falls.  Heart rate today is 86.  Blood pressure is 108/50.  Current cardiac regimen includes aspirin 81 mg daily, atorvastatin 40 mg daily, lisinopril 20 mg daily, metoprolol 25 mg p.o. twice daily, sublingual nitroglycerin as needed.  He denies any claudication, DVT or PE-like symptoms, or lower extremity edema.  He and his wife mentioned they would like to follow with Dr. Antoine Poche in [place] since they live in that area.  Patient states he has some upcoming lab work to be drawn at PCP office.   Past Medical History:  Diagnosis Date   CAD (coronary artery disease)    a. 05/2014 NSTEMI/PCI: LM nl, LAD 6m (3.0x23 Xience Alpine DES), D1 90, LCX  79m, OM1 80, RCA 30p/d, RPDA 40-50p, EF 40-45% ->Plan for staged PCI of LCX/OM1.   Cardiomyopathy, ischemic    a. 05/2014 Echo: Ef 45-50%, mid-apicalanteroseptal AK, Gr 2 DD.   Cataract    Fracture, ankle 06/12/2014   Hyperlipidemia    Hypertension    Myocardial infarction [facility]) 2015   non STEMI   Nephrolithiasis 01/16/2013   NSVT (nonsustained ventricular tachycardia)    a.  05/2014 in setting of NSTEMI -  asymptomatic.    Past Surgical History:  Procedure Laterality Date   ANKLE FRACTURE SURGERY     CARDIAC CATHETERIZATION     CARDIAC CATHETERIZATION Right 06/20/2014   Procedure: CORONARY STENT INTERVENTION;  Surgeon: Peter M Swaziland, MD;  Location: [facility] CATH LAB;  Service: Cardiovascular;  Laterality: Right;   CATARACT EXTRACTION Bilateral    CORONARY STENT PLACEMENT     LEFT HEART CATHETERIZATION WITH CORONARY ANGIOGRAM N/A 06/20/2014   Procedure: LEFT HEART CATHETERIZATION WITH CORONARY ANGIOGRAM;  Surgeon: Peter M Swaziland, MD;  Location: [facility] CATH LAB;  Service: Cardiovascular;  Laterality: N/A;   NASAL SEPTUM SURGERY     VASECTOMY      Current Outpatient Medications  Medication Sig Dispense Refill   aspirin EC 81 MG EC tablet Take 1 tablet (81 mg total) by mouth daily.     atorvastatin (LIPITOR) 40 MG tablet TAKE 1 TABLET ONCE DAILY AT BEDTIME 90 tablet 1   donepezil (ARICEPT) 10 MG tablet Take 1 tablet (10 mg total) by mouth at bedtime. 90 tablet 3   lisinopril (ZESTRIL) 20 MG tablet Take 1 tablet (20 mg total) by mouth daily. 90 tablet 1   metoprolol tartrate (LOPRESSOR) 25 MG tablet Take 1 tablet (25 mg total) by mouth 2 (two) times daily. 180 tablet 1   nitroGLYCERIN (NITROSTAT) 0.4 MG SL tablet Place 1 tablet (0.4 mg total) under the tongue every 5 (five) minutes x 3 doses as needed for chest pain. 25 tablet 3   polyethylene glycol powder (MIRALAX) 17 GM/SCOOP powder Take 17 g by mouth 2 (two) times daily as needed for moderate constipation. 255 g 0   QUEtiapine (SEROQUEL) 25 MG tablet Take 1 tablet (25 mg total) by mouth at bedtime. 90 tablet 1   senna-docusate (SENOKOT-S) 8.6-50 MG tablet Take 1 tablet by mouth 2 (two) times daily between meals as needed for mild constipation. 60 tablet 0   No current facility-administered medications for this visit.   Allergies:  Patient has no known allergies.   Social History: The patient  reports that he quit  smoking about 33 years ago. His smoking use included cigarettes. He started smoking about 56 years ago. He has a 23.00 pack-year smoking history. He quit smokeless tobacco use about 33 years ago.  His smokeless tobacco use included chew. He reports current alcohol use of about 7.0 standard drinks per week. He reports that he does not use drugs.   Family History: The patient's family history includes Alcohol abuse in his father; Cancer in his brother and father; Hyperlipidemia in his father and sister; Hypertension in his mother; Nephrolithiasis in his brother.   ROS:  Please see the history of present illness. Otherwise, complete review of systems is positive for none.  All other systems are reviewed and negative.   Physical Exam: VS:  BP (!) 108/50   Pulse 86   Ht 5\' 9"  (1.753 m)   Wt 162 lb 6.4 oz (73.7 kg)   SpO2 97%   BMI 23.98 kg/m , BMI  Body mass index is 23.98 kg/m.  Wt Readings from Last 3 Encounters:  06/17/21 162 lb 6.4 oz (73.7 kg)  06/15/21 165 lb (74.8 kg)  02/03/21 168 lb 14 oz (76.6 kg)    General: Patient appears comfortable at rest. Neck: Supple, no elevated JVP or carotid bruits, no thyromegaly. Lungs: Clear to auscultation, nonlabored breathing at rest. Cardiac: Regular rate and rhythm, no S3 or significant systolic murmur, no pericardial rub. Extremities: No pitting edema, distal pulses 2+. Skin: Warm and dry. Musculoskeletal: No kyphosis. Neuropsychiatric: Alert and oriented x3, affect grossly appropriate.  ECG:  EKG 01/29/2021 sinus rhythm rate of 66, borderline right axis deviation  Recent Labwork: 01/29/2021: TSH 1.321 01/30/2021: ALT 19; AST 19; Magnesium 1.9 02/04/2021: BUN 18; Creatinine, Ser 0.91; Hemoglobin 15.4; Platelets 218; Potassium 4.3; Sodium 135     Component Value Date/Time   CHOL 116 01/28/2021 0923   CHOL 171 01/16/2013 0845   TRIG 77 01/28/2021 0923   TRIG 134 10/23/2014 0956   TRIG 176 (H) 01/16/2013 0845   HDL 42 01/28/2021 0923   HDL 42  10/23/2014 0956   HDL 31 (L) 01/16/2013 0845   CHOLHDL 2.8 01/28/2021 0923   CHOLHDL 3.2 06/20/2014 0210   VLDL 20 06/20/2014 0210   LDLCALC 58 01/28/2021 0923   LDLCALC 83 01/16/2014 1054   LDLCALC 105 (H) 01/16/2013 0845    Other Studies Reviewed Today:   Echocardiogram 01/30/2021  1. Left ventricular ejection fraction, by estimation, is 55 to 60%. The left ventricle has normal function. The left ventricle has no regional Wirt motion abnormalities. There is mild asymmetric left ventricular hypertrophy of the septal segment. Left ventricular diastolic parameters are indeterminate in the setting of atrial flutter. 2. Right ventricular systolic function is low normal. The right ventricular size is normal. Tricuspid regurgitation signal is inadequate for assessing PA pressure. 3. The mitral valve is grossly normal. Trivial mitral valve regurgitation. 4. The aortic valve is tricuspid. Aortic valve regurgitation is not visualized. 5. The inferior vena cava is normal in size with greater than 50% respiratory variability, suggesting right atrial pressure of 3 mmHg.   CT of the head without contrast 01/29/2021 IMPRESSION: 1. Normal for age non contrast CT appearance of the brain. 2. This was discussed by telephone with Dr. Jacqulyn Bath in the ED on 01/29/2021 at 09:14 . 3. Chronic paranasal sinus disease, prior sinus surgery.  CT angio head and neck 01/29/2021 IMPRESSION: No large vessel occlusion in the neck. No stenosis at the ICA origins. Calcified plaque at the right vertebral artery origin causing at least moderate stenosis.   No proximal intracranial vessel occlusion or significant stenosis.   Perfusion imaging demonstrates no evidence of core infarction or penumbra.   MRI brain 01/29/2021 IMPRESSION: Mildly motion degraded exam.   No evidence of acute intracranial abnormality.   Mild generalized parenchymal atrophy.   Mild paranasal sinus mucosal thickening, as  described.  Assessment and Plan:  1. CAD in native artery   2. Essential hypertension   3. Cardiomyopathy, ischemic   4. Hyperlipidemia LDL goal <70   5. AF (paroxysmal atrial fibrillation) (HCC)     1. CAD in native artery Previous history of LAD stent for NSTEMI. Denies any anginal or exertional symptoms.  Continue sublingual nitroglycerin as needed, continue aspirin 81 mg daily.  Continue metoprolol 25 mg p.o. twice daily.  2. Essential hypertension BP today 108/50.  Continue lisinopril 20 mg p.o. daily.  Continue metoprolol 25 mg p.o. twice daily.  3. Cardiomyopathy,  ischemic Recent echocardiogram January 30, 2021 with EF 55 to 60%.  No WMA's, mild asymmetric LVH of septal segment.  LV diastolic parameters indeterminate in setting of atrial flutter.  Trivial MR,  4. Hyperlipidemia LDL goal <70 Continue atorvastatin 40 mg daily.  5.  PAF Denies any episodes of PAF.  Recent EKG in June at ED showed sinus rhythm rate of 66.  Continue metoprolol 25 mg p.o. twice daily.  Continue aspirin 81 mg daily.  Patient not on anticoagulation due to fall risk.   Medication Adjustments/Labs and Tests Ordered: Current medicines are reviewed at length with the patient today.  Concerns regarding medicines are outlined above.   Disposition: Patient and wife want to establish and follow-up with Dr. Antoine Poche or APP in [employer] 1 year  Signed, Rennis Harding, NP 06/17/2021 8:47 AM    [facility] [address] [place], [place], [place] [address] Phone: [phone]; Fax: [phone]

## 2021-06-17 ENCOUNTER — Ambulatory Visit: Payer: Medicare Other | Admitting: Family Medicine

## 2021-06-17 ENCOUNTER — Encounter: Payer: Self-pay | Admitting: Family Medicine

## 2021-06-17 VITALS — BP 108/50 | HR 86 | Ht 69.0 in | Wt 162.4 lb

## 2021-06-17 DIAGNOSIS — I2510 Atherosclerotic heart disease of native coronary artery without angina pectoris: Secondary | ICD-10-CM | POA: Diagnosis not present

## 2021-06-17 DIAGNOSIS — E785 Hyperlipidemia, unspecified: Secondary | ICD-10-CM

## 2021-06-17 DIAGNOSIS — I10 Essential (primary) hypertension: Secondary | ICD-10-CM | POA: Diagnosis not present

## 2021-06-17 DIAGNOSIS — I255 Ischemic cardiomyopathy: Secondary | ICD-10-CM | POA: Diagnosis not present

## 2021-06-17 DIAGNOSIS — I480 Paroxysmal atrial fibrillation: Secondary | ICD-10-CM

## 2021-06-17 NOTE — Patient Instructions (Addendum)
Medication Instructions:  Your physician recommends that you continue on your current medications as directed. Please refer to the Current Medication list given to you today.  Labwork: none  Testing/Procedures: none  Follow-Up: Your physician recommends that you schedule a follow-up appointment in: 1 year at the [place] office. You will receive a reminder call or letter in the mail in about 10 months reminding you to call and schedule your appointment. If you don't receive this letter, please contact our office.  Any Other Special Instructions Will Be Listed Below (If Applicable).  If you need a refill on your cardiac medications before your next appointment, please call your pharmacy.

## 2021-07-05 NOTE — Progress Notes (Deleted)
PATIENT: Adam Rowland DOB: 09-Feb-1946  REASON FOR VISIT: follow up HISTORY FROM: patient  No chief complaint on file.    HISTORY OF PRESENT ILLNESS: 07/05/21 ALL: Colan returns today for follow up for dementia. He continues Aricept and Namenda and tolerating well. He presents with his wife who aids in most of history. He is having more difficulty with words. It takes him much longer to get words out. He is walking much slower. Gait is stable. No imbalance or falls. No assistive devices. He is not as motivated. Mood seems stable. He can mow the yard if directed. He continues to go to his son's business everyday but does not help much with operations. Appetite is good. He is sleeping well. He drives to his son's business every morning at 8am and drives home in the afternoon without difficulty. He does not drive, otherwise. Son uses life 360 to monitor location.    12/30/2019 ALL:  Adam Rowland is a 75 y.o. male here today for follow up for memory. He continues Aricept 66m daily and Namenda 118mtwice daily. He is doing well and tolerating medications without obvious adverse effects. 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 He drives locally without difficulty. He performs ADL's independently. No falls. He is able to participate in financial conversations with fiFacilities managerHe does have more difficulty with word finding. This is worse in situations where he is anxious. Overall, he feels that he is doing well.   HISTORY: (copied from my note on 07/01/2019)  LeAMAHD MORINOs a [age].o. male here today for follow up for mild dementia. He continues Aricept 1063mightly and Namenda 5mg49mice daily without adverse effects noted.  Mr. WallWinokurorts that he is doing well.  He is able to perform all ADLs independently.  He continues to drive but only short, local distances.  He is exercising daily.  He rides a stationary bike for about 30 minutes.  He continues to participate in his son'[employer] in [place] continues close follow-up with primary care for chronic disease management.  No concerns with getting lost, accidents or falls.   HISTORY: (copied from Dr AthaGuadelupe Rowland on 03/07/2019)   [name] 73 y54r old right-handed gentleman with an underlying medical history of coronary artery disease with history of non-STEMI, history of ischemic cardiomyopathy, hyperlipidemia, hypertension, kidney stones, nonsustained V. tach, and overweight state, who presents for follow-up consultation of his dementia.  The patient is accompanied by his wife again today.  We had a virtual phone call visit on 12/06/2018, at which time he felt fairly stable.  He was able to tolerate donepezil 10 mg daily.  He started this in June 2019.  We talked about potentially utilizing a second memory medication.    Today, 03/07/19: Please see below for documentation on the virtual visit.    He reports feeling fairly stable, he continues to tolerate the donepezil.  He has had no falls, he tries to stay active but his wife has noted that he is not as outgoing and active as he used to be.  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  He seems to be less active in conversation.  She has noted that he has more word finding difficulties or delay in responding.  He seems to be just a little bit more sedentary in her perception.  He  still drives, mostly locally.  He still sees his mother who lives about 30 minutes drive away and he manages the drive without problems, [place].  He uses the stationary bike every day for about 30 minutes.  She estimates that he drinks about 4 small bottles of water, 8 ounces each.   The patient's allergies, current medications, family history, past medical history, past social history, past surgical history and problem list were reviewed and updated as appropriate.    Previously:   I saw him on  02/13/2018, at which time his MMSE was 21. We talked about his neuropsychological test results from May 2019. I suggested we start him on Aricept 5 mg strength with titration to 10 mg.   He saw Cecille Rubin, nurse practitioner in the interim on 06/06/2018, at which time his MMSE was 26. He was advised to continue with Aricept.   I first met him on 08/14/2017 at the request of his primary care nurse practitioner, at which time he reported short-term memory loss for the past 3-4 years, maybe 5 years. I suggested further workup in the form of neuropsychological evaluation and blood work as well as brain scan. His blood work was unremarkable with the exception of A1c in the prediabetes range. We checked thyroid function with TSH and B12 level as well. RPR was negative. He had a brain MRI with contrast on 08/18/2017 and I reviewed the results: IMPRESSION:  Slightly abnormal MRI scan of the brain showing mild degree of generalized cerebral atrophy. 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   He was called with the results.    He had interim neuropsychological evaluation on 01/01/2018 as well as a feedback appointment on 01/08/18 with Dr. Bonita Quin and I reviewed the results: << Clinical Impressions: Mild dementia, unspecified, without behavioral disturbance (rule out mixed vascular dementia and Alzheimer's disease).  Results of cognitive testing were abnormal and demonstrated significant impairment in several domains of cognitive function. Additionally, there is evidence that his cognitive deficits are interfering with his ability to perform complex tasks such as managing appointments and finances. As such, diagnostic criteria for a dementia syndrome are met.  The patient's cognitive profile demonstrates both cortical and subcortical features. Consistent with cortical dysfunction, he demonstrated memory  consolidation dysfunction and mildly impaired confrontation naming. Consistent with subcortical dysfunction, he demonstrated slowed processing speed, executive dysfunction and impaired verbal fluency. This may represent a mixed dementia. It is certainly possible that Alzheimer's disease is present, but I also wonder about vascular contribution based on cardiac history and cognitive profile. Surprisingly, brain MRI report did not mention any small vessel disease or remote infarcts.  Overall, based on current level of functioning and test scores, I would characterize his dementia as mild stage. There is no evidence of behavioral disturbance. There also is no evidence of depression, anxiety or other primary psychiatric disorder   REVIEW OF SYSTEMS: Out of a complete 14 system review of symptoms, the patient complains only of the following symptoms, none and all other reviewed systems are negative.  ALLERGIES: No Known Allergies  HOME MEDICATIONS: Outpatient Medications Prior to Visit  Medication Sig Dispense Refill   aspirin EC 81 MG EC tablet Take 1 tablet (81 mg total) by mouth daily.     atorvastatin (LIPITOR) 40 MG tablet TAKE 1 TABLET ONCE DAILY AT BEDTIME 90 tablet 1   donepezil (ARICEPT) 10 MG tablet  Take 1 tablet (10 mg total) by mouth at bedtime. 90 tablet 3   lisinopril (ZESTRIL) 20 MG tablet Take 1 tablet (20 mg total) by mouth daily. 90 tablet 1   metoprolol tartrate (LOPRESSOR) 25 MG tablet Take 1 tablet (25 mg total) by mouth 2 (two) times daily. 180 tablet 1   nitroGLYCERIN (NITROSTAT) 0.4 MG SL tablet Place 1 tablet (0.4 mg total) under the tongue every 5 (five) minutes x 3 doses as needed for chest pain. 25 tablet 3   polyethylene glycol powder (MIRALAX) 17 GM/SCOOP powder Take 17 g by mouth 2 (two) times daily as needed for moderate constipation. 255 g 0   QUEtiapine (SEROQUEL) 25 MG tablet Take 1 tablet (25 mg total) by mouth at bedtime. 90 tablet 1   senna-docusate (SENOKOT-S)  8.6-50 MG tablet Take 1 tablet by mouth 2 (two) times daily between meals as needed for mild constipation. 60 tablet 0   No facility-administered medications prior to visit.    PAST MEDICAL HISTORY: Past Medical History:  Diagnosis Date   CAD (coronary artery disease)    a. 05/2014 NSTEMI/PCI: LM nl, LAD 42m(3.0x23 Xience Alpine DES), D1 90, LCX 876mOM1 80, RCA 30p/d, RPDA 40-50p, EF 40-45% ->Plan for staged PCI of LCX/OM1.   Cardiomyopathy, ischemic    a. 05/2014 Echo: Ef 45-50%, mid-apicalanteroseptal AK, Gr 2 DD.   Cataract    Fracture, ankle 06/12/2014   Hyperlipidemia    Hypertension    [facility]   non [facility]I   Nephrolithiasis 01/16/2013   NSVT (nonsustained ventricular tachycardia)    a. 05/2014 in setting of NSTEMI -  asymptomatic.    PAST SURGICAL HISTORY: Past Surgical History:  Procedure Laterality Date   ANKLE FRACTURE SURGERY     CARDIAC CATHETERIZATION     CARDIAC CATHETERIZATION Right 06/20/2014   Procedure: CORONARY STENT INTERVENTION;  Surgeon: Peter M JoMartiniqueMD;  [facility];  Service: Cardiovascular;  Laterality: Right;   CATARACT EXTRACTION Bilateral    CORONARY STENT PLACEMENT     LEFT HEART CATHETERIZATION WITH CORONARY ANGIOGRAM N/A 06/20/2014   Procedure: LEFT HEART CATHETERIZATION WITH CORONARY ANGIOGRAM;  Surgeon: Peter M JoMartiniqueMD;  [facility];  Service: Cardiovascular;  Laterality: N/A;   NASAL SEPTUM SURGERY     VASECTOMY      FAMILY HISTORY: Family History  Problem Relation Age of Onset   Hypertension Mother    Cancer Father        bladder ca   Hyperlipidemia Father    Alcohol abuse Father    Hyperlipidemia Sister    Cancer Brother    Nephrolithiasis Brother    Colon cancer Neg Hx    Stomach cancer Neg Hx    Esophageal cancer Neg Hx     SOCIAL HISTORY: Social History   Socioeconomic History   Marital status: Married    Spouse name: JoBlanch Media Number of children: 1   Years of education:  16   Highest education level: Bachelor's degree (e.g., BA, AB, BS)  Occupational History   Occupation: Retired  Tobacco Use   Smoking status: Former    Packs/day: 1.00    Years: 23.00    Pack years: 23.00    Types: Cigarettes    Start date: 10/26/1964    Quit date: 08/30/1987    Years since quitting: 33.8   Smokeless tobacco: Former    Types: Chew    Quit date: 02/27/1988   Tobacco comments:  quit in 1989, smoked since he was a teenager/chewed tobacco for about 6 months after he quit smoking cigarettes  Vaping Use   Vaping Use: Never used  Substance and Sexual Activity   Alcohol use: Yes    Alcohol/week: 7.0 standard drinks    Types: 7 Glasses of wine per week    Comment: 1 glass of wine nightly    Drug use: No   Sexual activity: Not Currently  Other Topics Concern   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 He has one son and one granddaughter - they live nearby.   Social Determinants of Health   Financial Resource Strain: Low Risk    Difficulty of Paying Living Expenses: Not hard at all  Food Insecurity: No Food Insecurity   Worried About Charity fundraiser in the Last Year: Never true   [place] in the Last Year: Never true  Transportation Needs: No Transportation Needs   Lack of Transportation (Medical): No   Lack of Transportation (Non-Medical): No  Physical Activity: Insufficiently Active   Days of Exercise per Week: 4 days   Minutes of Exercise per Session: 30 min  Stress: No Stress Concern Present   Feeling of Stress : Not at all  Social Connections: Socially Integrated   Frequency of Communication with Friends and Family: More than three times a week   Frequency of Social Gatherings with Friends and Family: More than three times a week   Attends Religious Services: More than 4 times per year   Active Member of [employer] or Organizations: Yes   Attends Music therapist: More than 4 times per year    Marital Status: Married  Human resources officer Violence: Not At Risk   Fear of Current or Ex-Partner: No   Emotionally Abused: No   Physically Abused: No   Sexually Abused: No      PHYSICAL EXAM  There were no vitals filed for this visit.  There is no height or weight on file to calculate BMI.  Generalized: Well developed, in no acute distress  Cardiology: normal rate and rhythm, no murmur noted Respiratory: clear to auscultation bilaterally  Neurological examination  Mentation: Alert, he is not oriented to time, place. Able to assist with some history taking. Follows all commands speech and language fluent but slow. Decreased recall and registration.  Cranial nerve II-XII: Pupils were equal round reactive to light. Extraocular movements were full, visual field were full on confrontational test. Facial sensation and strength were normal. Head turning and shoulder shrug  were normal and symmetric. Motor: The motor testing reveals 5 over 5 strength of all 4 extremities. Good symmetric motor tone is noted throughout.  Sensory: Sensory testing is intact to soft touch on all 4 extremities. No evidence of extinction is noted.  Coordination: Cerebellar testing reveals good finger-nose-finger and heel-to-shin bilaterally.  Gait and station: Gait is normal and stable, decreased arm swing   DIAGNOSTIC DATA (LABS, IMAGING, TESTING) - I reviewed patient records, labs, notes, testing and imaging myself where available.  MMSE - Mini Mental State Exam 12/30/2020 06/10/2020 12/30/2019  Not completed: - Refused -  Orientation to time 0 - 3  Orientation to time comments - - -  Orientation to Place 3 - 4  Registration 3 - 3  Attention/ Calculation 1 - 1  Attention/Calculation-comments - - -  Recall 2 - 1  Language- name 2 objects 2 - 2  Language-  repeat 1 - 1  Language- follow 3 step command 2 - 3  Language- read & follow direction 1 - 1  Language-read & follow direction-comments - - -  Write a  sentence 0 - 1  Copy design 0 - 1  Copy design-comments - - -  Total score 15 - 21     Lab Results  Component Value Date   WBC 8.8 02/04/2021   HGB 15.4 02/04/2021   HCT 44.1 02/04/2021   MCV 96.5 02/04/2021   PLT 218 02/04/2021      Component Value Date/Time   NA 135 02/04/2021 0150   NA 141 01/28/2021 0923   K 4.3 02/04/2021 0150   CL 103 02/04/2021 0150   CO2 25 02/04/2021 0150   GLUCOSE 116 (H) 02/04/2021 0150   BUN 18 02/04/2021 0150   BUN 15 01/28/2021 0923   CREATININE 0.91 02/04/2021 0150   CREATININE 0.86 01/16/2013 0845   CALCIUM 8.7 (L) 02/04/2021 0150   PROT 5.4 (L) 01/30/2021 0027   PROT 6.5 01/28/2021 0923   ALBUMIN 3.1 (L) 01/30/2021 0027   ALBUMIN 4.4 01/28/2021 0923   AST 19 01/30/2021 0027   ALT 19 01/30/2021 0027   ALKPHOS 43 01/30/2021 0027   BILITOT 0.9 01/30/2021 0027   BILITOT 0.8 01/28/2021 0923   GFRNONAA >60 02/04/2021 0150   GFRNONAA >89 01/16/2013 0845   GFRAA 82 07/29/2020 0904   GFRAA >89 01/16/2013 0845   Lab Results  Component Value Date   CHOL 116 01/28/2021   HDL 42 01/28/2021   LDLCALC 58 01/28/2021   TRIG 77 01/28/2021   CHOLHDL 2.8 01/28/2021   Lab Results  Component Value Date   HGBA1C 5.1 01/28/2020   Lab Results  Component Value Date   FBPZWCHE52 778 01/29/2021   Lab Results  Component Value Date   TSH 1.321 01/29/2021     ASSESSMENT AND PLAN 75 y.o. year old male  has a past medical history of CAD (coronary artery disease), Cardiomyopathy, ischemic, Cataract, Fracture, ankle (06/12/2014), Hyperlipidemia, Hypertension, Myocardial infarction ([place]) (2015), Nephrolithiasis (01/16/2013), and NSVT (nonsustained ventricular tachycardia). here with   No diagnosis found.    Mr. Carver has experienced some declining symptoms over the past year. He continues to perform ADLs independently but has more difficulty with conversation.  He will continue Aricept and Namenda.  He is tolerating his medications well with no obvious  adverse effects. We have discussed option to consider PT/ST for gait training and cognitive therapy. His wife will think about this option. We have also discussed need to monitor closely for depression. It is unclear if he is experiencing depression but does seem less motivated and not engage with conversation. I have encouraged Mrs Michaelsen to monitor this closely at home. May consider treating with SSRI/SNRI. Wellbutrin may help with increasing energy and focus. He was encouraged to stay physically and mentally active.  We have reviewed memory compensation strategies in the office.  He will continue to work on adequate hydration and healthy lifestyle habits.  He will continue close follow-up with primary care.  I have advised extreme caution with driving. He should not drive at night or in unfamiliar areas. He will follow-up with me in 6-12 months, sooner if needed.  He and his wife both verbalized understanding and agreement with this plan.   No orders of the defined types were placed in this encounter.    No orders of the defined types were placed in this encounter.    Vincentina Sollers  Ubaldo Glassing, FNP-C 07/05/2021, 3:11 PM [facility] [address] [place], [place] [address] [phone]

## 2021-07-05 NOTE — Patient Instructions (Incomplete)

## 2021-07-07 ENCOUNTER — Ambulatory Visit: Payer: Medicare Other | Admitting: Family Medicine

## 2021-07-07 DIAGNOSIS — F0390 Unspecified dementia without behavioral disturbance: Secondary | ICD-10-CM

## 2021-08-02 ENCOUNTER — Ambulatory Visit (INDEPENDENT_AMBULATORY_CARE_PROVIDER_SITE_OTHER): Payer: Medicare Other | Admitting: Nurse Practitioner

## 2021-08-02 ENCOUNTER — Encounter: Payer: Self-pay | Admitting: Nurse Practitioner

## 2021-08-02 VITALS — BP 123/75 | HR 54 | Temp 97.4°F | Ht 69.0 in | Wt 159.0 lb

## 2021-08-02 DIAGNOSIS — I10 Essential (primary) hypertension: Secondary | ICD-10-CM

## 2021-08-02 DIAGNOSIS — I480 Paroxysmal atrial fibrillation: Secondary | ICD-10-CM

## 2021-08-02 DIAGNOSIS — F0390 Unspecified dementia without behavioral disturbance: Secondary | ICD-10-CM

## 2021-08-02 DIAGNOSIS — I25118 Atherosclerotic heart disease of native coronary artery with other forms of angina pectoris: Secondary | ICD-10-CM | POA: Diagnosis not present

## 2021-08-02 DIAGNOSIS — I252 Old myocardial infarction: Secondary | ICD-10-CM

## 2021-08-02 DIAGNOSIS — E8881 Metabolic syndrome: Secondary | ICD-10-CM

## 2021-08-02 DIAGNOSIS — N410 Acute prostatitis: Secondary | ICD-10-CM

## 2021-08-02 DIAGNOSIS — G9341 Metabolic encephalopathy: Secondary | ICD-10-CM

## 2021-08-02 DIAGNOSIS — Z6827 Body mass index (BMI) 27.0-27.9, adult: Secondary | ICD-10-CM

## 2021-08-02 DIAGNOSIS — I255 Ischemic cardiomyopathy: Secondary | ICD-10-CM | POA: Diagnosis not present

## 2021-08-02 DIAGNOSIS — E782 Mixed hyperlipidemia: Secondary | ICD-10-CM

## 2021-08-02 MED ORDER — METOPROLOL TARTRATE 25 MG PO TABS
25.0000 mg | ORAL_TABLET | Freq: Two times a day (BID) | ORAL | 1 refills | Status: DC
Start: 2021-08-02 — End: 2022-05-27

## 2021-08-02 MED ORDER — ATORVASTATIN CALCIUM 40 MG PO TABS
ORAL_TABLET | ORAL | 1 refills | Status: DC
Start: 2021-08-02 — End: 2022-12-21

## 2021-08-02 MED ORDER — DONEPEZIL HCL 10 MG PO TABS: 10.0000 mg | ORAL_TABLET | Freq: Every day | ORAL | 1 refills | Status: DC

## 2021-08-02 MED ORDER — LISINOPRIL 20 MG PO TABS: 20.0000 mg | ORAL_TABLET | Freq: Every day | ORAL | 1 refills | Status: DC

## 2021-08-02 NOTE — Patient Instructions (Signed)
Dementia Caregiver Guide °Dementia is a term used to describe a number of symptoms that affect memory and thinking. The most common symptoms include: °Memory loss. °Trouble with language and communication. °Trouble concentrating. °Poor judgment and problems with reasoning. °Wandering from home or public places. °Extreme anxiety or depression. °Being suspicious or having angry outbursts and accusations. °Child-like behavior and language. °Dementia can be frightening and confusing. And taking care of someone with dementia can be challenging. This guide provides tips to help you when providing care for a person with dementia. °How to help manage lifestyle changes °Dementia usually gets worse slowly over time. In the early stages, people with dementia can stay independent and safe with some help. In later stages, they need help with daily tasks such as dressing, grooming, and using the bathroom. There are actions you can take to help a person manage his or her life while living with this condition. °Communicating °When the person is talking or seems frustrated, make eye contact and hold the person's hand. °Ask specific questions that need yes or no answers. °Use simple words, short sentences, and a calm voice. Only give one direction at a time. °When offering choices, limit the person to just one or two. °Avoid correcting the person in a negative way. °If the person is struggling to find the right words, gently try to help him or her. °Preventing injury ° °Keep floors clear of clutter. Remove rugs, magazine racks, and floor lamps. °Keep hallways well lit, especially at night. °Put a handrail and nonslip mat in the bathtub or shower. °Put childproof locks on cabinets that contain dangerous items, such as medicines, alcohol, guns, toxic cleaning items, sharp tools or utensils, matches, and lighters. °For doors to the outside of the house, put the locks in places where the person cannot see or reach them easily. This will  help ensure that the person does not wander out of the house and get lost. °Be prepared for emergencies. Keep a list of emergency phone numbers and addresses in a convenient area. °Remove car keys and lock garage doors so that the person does not try to get in the car and drive. °Have the person wear a bracelet that tracks locations and identifies the person as having memory problems. This should be worn at all times for safety. °Helping with daily life ° °Keep the person on track with his or her routine. °Try to identify areas where the person may need help. °Be supportive, patient, calm, and encouraging. °Gently remind the person that adjusting to changes takes time. °Help with the tasks that the person has asked for help with. °Keep the person involved in daily tasks and decisions as much as possible. °Encourage conversation, but try not to get frustrated if the person struggles to find words or does not seem to appreciate your help. °How to recognize stress °Look for signs of stress in yourself and in the person you are caring for. If you notice signs of stress, take steps to manage it. Symptoms of stress include: °Feeling anxious, irritable, frustrated, or angry. °Denying that the person has dementia or that his or her symptoms will not improve. °Feeling depressed, hopeless, or unappreciated. °Difficulty sleeping. °Difficulty concentrating. °Developing stress-related health problems. °Feeling like you have too little time for your own life. °Follow these instructions at home: °Take care of your health °Make sure that you and the person you are caring for: °Get regular sleep. °Exercise regularly. °Eat regular, nutritious meals. °Take over-the-counter and prescription medicines only   as told by your health care providers. °Drink enough fluid to keep your urine pale yellow. °Attend all scheduled health care appointments. ° °General instructions °Join a support group with others who are caregivers. °Ask about  respite care resources. Respite care can provide short-term care for the person so that you can have a regular break from the stress of caregiving. °Consider any safety risks and take steps to avoid them. °Organize medicines in a pill box for each day of the week. °Create a plan to handle any legal or financial matters. Get legal or financial advice if needed. °Keep a calendar in a central location to remind the person of appointments or other activities. °Where to find support: °Many individuals and organizations offer support. These include: °Support groups for people with dementia. °Support groups for caregivers. °Counselors or therapists. °Home health care services. °Adult day care centers. °Where to find more information °Centers for Disease Control and Prevention: www.cdc.gov °Alzheimer's Association: www.alz.org °Family Caregiver Alliance: www.caregiver.org °Alzheimer's Foundation of [place]: www.alzfdn.org °Contact a health care provider if: °The person's health is rapidly getting worse. °You are no longer able to care for the person. °Caring for the person is affecting your physical and emotional health. °You are feeling depressed or anxious about caring for the person. °Get help right away if: °The person threatens himself or herself, you, or anyone else. °You feel depressed or sad, or feel that you want to harm yourself. °If you ever feel like your loved one may hurt himself or herself or others, or if he or she shares thoughts about taking his or her own life, get help right away. You can go to your nearest emergency department or: °Call your local emergency services (911 in the [place] °Call a suicide crisis helpline, such as the National Suicide Prevention Lifeline at 1-800-273-8255 or 988 in the [place] This is open 24 hours a day in the [place] °Text the Crisis Text Line at 741741 (in the [place] °Summary °Dementia is a term used to describe a number of symptoms that affect memory and thinking. °Dementia  usually gets worse slowly over time. °Take steps to reduce the person's risk of injury and to plan for future care. °Caregivers need support, relief from caregiving, and time for their own lives. °This information is not intended to replace advice given to you by your health care provider. Make sure you discuss any questions you have with your health care provider. °Document Revised: 03/10/2021 Document Reviewed: 12/30/2019 °Elsevier Patient Education © 2022 [employer] ° °

## 2021-08-02 NOTE — Progress Notes (Addendum)
Subjective:    Patient ID: Adam Rowland, male    DOB: 1946-02-28, 75 y.o.   MRN: [id number]   Chief Complaint: medical management of chronic issues     HPI:  1. Primary hypertension No c/o chest pain, sob or headache. Does not check blood pressure athome. BP Readings from Last 3 Encounters:  08/02/21 123/75  06/17/21 (!) 108/50  02/04/21 (!) 112/58    2. Cardiomyopathy, ischemic 3. Coronary artery disease of native artery of native heart with stable angina pectoris (HCC) 4. AF (paroxysmal atrial fibrillation) ([place]) 5. History of non-ST elevation myocardial infarction (NSTEMI) Last saw cardiology on 06/17/21. Patient has been doing well and denies any chest pain. According to office note there were no changes made to plan of care. He is to follow up in 6 months are sooner if has issues.  6. Mixed hyperlipidemia Does try to watch diet but does no dedicated exercise. Lab Results  Component Value Date   CHOL 116 01/28/2021   HDL 42 01/28/2021   LDLCALC 58 01/28/2021   TRIG 77 01/28/2021   CHOLHDL 2.8 01/28/2021     7. Metabolic syndrome Does not watch carbs in diet. He does not check his blood sugars at home. Lab Results  Component Value Date   HGBA1C 5.1 01/28/2020     8. Acute metabolic encephalopathy He has increasing dementia. He was in the hospital on 01/29/21. His last office visit with neurology was 12/30/20. According to office note , he was told to continue namenda and aricept. They are considering PT and ST for gait training and cognitive help. His wife was encouraged to keep a close check on him. His wife says his gai t has become very slow and strength is waining. He use to ride his exercise bike every day but he stopped doing that.  9. Dementia without behavioral disturbance, psychotic disturbance, mood disturbance, or anxiety, unspecified dementia severity, unspecified dementia type ([place]) Starting to affect his gait and memory.  10. Acute prostatitis No  problems voiding  11. BMI 27.0-27.9,adult Weight is down 3lbs. BMI Readings from Last 3 Encounters:  08/02/21 23.48 kg/m  06/17/21 23.98 kg/m  06/15/21 24.37 kg/m   Wt Readings from Last 3 Encounters:  08/02/21 159 lb (72.1 kg)  06/17/21 162 lb 6.4 oz (73.7 kg)  06/15/21 165 lb (74.8 kg)       Outpatient Encounter Medications as of 08/02/2021  Medication Sig   aspirin EC 81 MG EC tablet Take 1 tablet (81 mg total) by mouth daily.   atorvastatin (LIPITOR) 40 MG tablet TAKE 1 TABLET ONCE DAILY AT BEDTIME   donepezil (ARICEPT) 10 MG tablet Take 1 tablet (10 mg total) by mouth at bedtime.   lisinopril (ZESTRIL) 20 MG tablet Take 1 tablet (20 mg total) by mouth daily.   metoprolol tartrate (LOPRESSOR) 25 MG tablet Take 1 tablet (25 mg total) by mouth 2 (two) times daily.   nitroGLYCERIN (NITROSTAT) 0.4 MG SL tablet Place 1 tablet (0.4 mg total) under the tongue every 5 (five) minutes x 3 doses as needed for chest pain.   polyethylene glycol powder (MIRALAX) 17 GM/SCOOP powder Take 17 g by mouth 2 (two) times daily as needed for moderate constipation.   QUEtiapine (SEROQUEL) 25 MG tablet Take 1 tablet (25 mg total) by mouth at bedtime.   senna-docusate (SENOKOT-S) 8.6-50 MG tablet Take 1 tablet by mouth 2 (two) times daily between meals as needed for mild constipation.   No facility-administered encounter medications  on file as of 08/02/2021.    Past Surgical History:  Procedure Laterality Date   ANKLE FRACTURE SURGERY     CARDIAC CATHETERIZATION     CARDIAC CATHETERIZATION Right 06/20/2014   Procedure: CORONARY STENT INTERVENTION;  Surgeon: Peter M Martinique, MD;  Location: [facility] CATH LAB;  Service: Cardiovascular;  Laterality: Right;   CATARACT EXTRACTION Bilateral    CORONARY STENT PLACEMENT     LEFT HEART CATHETERIZATION WITH CORONARY ANGIOGRAM N/A 06/20/2014   Procedure: LEFT HEART CATHETERIZATION WITH CORONARY ANGIOGRAM;  Surgeon: Peter M Martinique, MD;  Location: [facility] CATH LAB;   Service: Cardiovascular;  Laterality: N/A;   NASAL SEPTUM SURGERY     VASECTOMY      Family History  Problem Relation Age of Onset   Hypertension Mother    Cancer Father        bladder ca   Hyperlipidemia Father    Alcohol abuse Father    Hyperlipidemia Sister    Cancer Brother    Nephrolithiasis Brother    Colon cancer Neg Hx    Stomach cancer Neg Hx    Esophageal cancer Neg Hx     New complaints: None today  Social history: Lives with his wife  Controlled substance contract: n/a     Review of Systems  Constitutional:  Negative for diaphoresis.  Eyes:  Negative for pain.  Respiratory:  Negative for shortness of breath.   Cardiovascular:  Negative for chest pain, palpitations and leg swelling.  Gastrointestinal:  Negative for abdominal pain.  Endocrine: Negative for polydipsia.  Skin:  Negative for rash.  Neurological:  Negative for dizziness, weakness and headaches.  Hematological:  Does not bruise/bleed easily.  All other systems reviewed and are negative.     Objective:   Physical Exam Vitals and nursing note reviewed.  Constitutional:      Appearance: Normal appearance. He is well-developed.  HENT:     Head: Normocephalic.     Nose: Nose normal.  Eyes:     Pupils: Pupils are equal, round, and reactive to light.  Neck:     Thyroid: No thyroid mass or thyromegaly.     Vascular: No carotid bruit or JVD.     Trachea: Phonation normal.  Cardiovascular:     Rate and Rhythm: Normal rate and regular rhythm.  Pulmonary:     Effort: Pulmonary effort is normal. No respiratory distress.     Breath sounds: Normal breath sounds.  Abdominal:     General: Bowel sounds are normal.     Palpations: Abdomen is soft.     Tenderness: There is no abdominal tenderness.  Musculoskeletal:        General: Normal range of motion.     Cervical back: Normal range of motion and neck supple.  Lymphadenopathy:     Cervical: No cervical adenopathy.  Skin:    General: Skin  is warm and dry.  Neurological:     Mental Status: He is alert and oriented to person, place, and time.  Psychiatric:        Behavior: Behavior normal.        Thought Content: Thought content normal.        Judgment: Judgment normal.   BP 123/75   Pulse (!) 54   Temp (!) 97.4 F (36.3 C) (Temporal)   Ht _0  (1.753 m)   Wt 159 lb (72.1 kg)   SpO2 98%   BMI 23.48 kg/m         Assessment & Plan:  Adam Rowland comes in today with chief complaint of Medical Management of Chronic Issues   Diagnosis and orders addressed:  1. Primary hypertension Low sodium diet - lisinopril (ZESTRIL) 20 MG tablet; Take 1 tablet (20 mg total) by mouth daily.  Dispense: 90 tablet; Refill: 1 - CBC with Differential/Platelet - CMP14+EGFR - Lipid panel  2. Cardiomyopathy, ischemic 3. Coronary artery disease of native artery of native heart with stable angina pectoris (HCC)  metoprolol tartrate (LOPRESSOR) 25 MG tablet; Take 1 tablet (25 mg total) by mouth 2 (two) times daily.  Dispense: 180 tablet; Refill: 1  4. AF (paroxysmal atrial fibrillation) ([place]) 5. History of non-ST elevation myocardial infarction (NSTEMI) Keep follow up [facility] cardiology  6. Mixed hyperlipidemia Low fat diet - atorvastatin (LIPITOR) 40 MG tablet; TAKE 1 TABLET ONCE DAILY AT BEDTIME  Dispense: 90 tablet; Refill: 1  7. Metabolic syndrome Watch carbs in diet  8. Acute metabolic encephalopathy Continue to orient daily   9. Dementia without behavioral disturbance, psychotic disturbance, mood disturbance, or anxiety, unspecified dementia severity, unspecified dementia type ([place]) Try luminosity games on computer Ride exercise bike 30 minutes daily - donepezil (ARICEPT) 10 MG tablet; Take 1 tablet (10 mg total) by mouth at bedtime.  Dispense: 90 tablet; Refill: 1  10. Acute prostatitis Report any problems voiding  11. BMI 27.0-27.9,adult Discussed diet and exercise for person with BMI >25 Will recheck weight in  3-6 months    Labs pending Health Maintenance reviewed Diet and exercise encouraged  Follow up plan: 6 months   [name] Hassell Done, FNP

## 2021-10-15 ENCOUNTER — Telehealth: Payer: Self-pay | Admitting: Nurse Practitioner

## 2021-10-15 NOTE — Telephone Encounter (Signed)
Please see if can find forms that were faxed yesterday

## 2021-10-15 NOTE — Telephone Encounter (Signed)
Unable to locate forms, asked to refax

## 2021-10-18 NOTE — Telephone Encounter (Signed)
Nurse has forms and patient has appointment tomorrow to fill out.

## 2021-10-19 ENCOUNTER — Ambulatory Visit (INDEPENDENT_AMBULATORY_CARE_PROVIDER_SITE_OTHER): Payer: Medicare Other | Admitting: Nurse Practitioner

## 2021-10-19 ENCOUNTER — Encounter: Payer: Self-pay | Admitting: Nurse Practitioner

## 2021-10-19 VITALS — BP 105/51 | HR 52 | Temp 96.8°F | Ht 69.0 in | Wt 159.6 lb

## 2021-10-19 DIAGNOSIS — F0390 Unspecified dementia without behavioral disturbance: Secondary | ICD-10-CM | POA: Diagnosis not present

## 2021-10-19 DIAGNOSIS — Z111 Encounter for screening for respiratory tuberculosis: Secondary | ICD-10-CM | POA: Diagnosis not present

## 2021-10-19 DIAGNOSIS — Z029 Encounter for administrative examinations, unspecified: Secondary | ICD-10-CM

## 2021-10-19 NOTE — Progress Notes (Signed)
Established Patient Office Visit  Subjective:  Patient ID: Adam Rowland, male    DOB: 1946-01-25  Age: 76 y.o. MRN: [id number]  CC:  Chief Complaint  Patient presents with   office visit    FL2 assessment/memory care facility    HPI Adam Rowland presents for completed Fl2 for memory care facility, per spouse patient is declining and needing more assistance with ADLs.  No other signs or symptoms associated with current concerns  Past Medical History:  Diagnosis Date   CAD (coronary artery disease)    a. 05/2014 NSTEMI/PCI: LM nl, LAD 64m(3.0x23 Xience Alpine DES), D1 90, LCX 855mOM1 80, RCA 30p/d, RPDA 40-50p, EF 40-45% ->Plan for staged PCI of LCX/OM1.   Cardiomyopathy, ischemic    a. 05/2014 Echo: Ef 45-50%, mid-apicalanteroseptal AK, Gr 2 DD.   Cataract    Fracture, ankle 06/12/2014   Hyperlipidemia    Hypertension    [facility]   non STEMI   Nephrolithiasis 01/16/2013   NSVT (nonsustained ventricular tachycardia)    a. 05/2014 in setting of NSTEMI -  asymptomatic.    Past Surgical History:  Procedure Laterality Date   ANKLE FRACTURE SURGERY     CARDIAC CATHETERIZATION     CARDIAC CATHETERIZATION Right 06/20/2014   Procedure: CORONARY STENT INTERVENTION;  Surgeon: Peter M JoMartiniqueMD;  [facility];  Service: Cardiovascular;  Laterality: Right;   CATARACT EXTRACTION Bilateral    CORONARY STENT PLACEMENT     LEFT HEART CATHETERIZATION WITH CORONARY ANGIOGRAM N/A 06/20/2014   Procedure: LEFT HEART CATHETERIZATION WITH CORONARY ANGIOGRAM;  Surgeon: Peter M JoMartiniqueMD;  [facility];  Service: Cardiovascular;  Laterality: N/A;   NASAL SEPTUM SURGERY     VASECTOMY      Family History  Problem Relation Age of Onset   Hypertension Mother    Cancer Father        bladder ca   Hyperlipidemia Father    Alcohol abuse Father    Hyperlipidemia Sister    Cancer Brother    Nephrolithiasis Brother    Colon cancer Neg Hx    Stomach  cancer Neg Hx    Esophageal cancer Neg Hx     Social History   Socioeconomic History   Marital status: Married    Spouse name: JoBlanch Media Number of children: 1   Years of education: 16   Highest education level: Bachelor's degree (e.g., BA, AB, BS)  Occupational History   Occupation: Retired  Tobacco Use   Smoking status: Former    Packs/day: 1.00    Years: 23.00    Pack years: 23.00    Types: Cigarettes    Start date: 10/26/1964    Quit date: 08/30/1987    Years since quitting: 34.1   Smokeless tobacco: Former    Types: Chew    Quit date: 02/27/1988   Tobacco comments:    quit in 1989, smoked since he was a teenager/chewed tobacco for about 6 months after he quit smoking cigarettes  Vaping Use   Vaping Use: Never used  Substance and Sexual Activity   Alcohol use: Yes    Alcohol/week: 7.0 standard drinks    Types: 7 Glasses of wine per week    Comment: 1 glass of wine nightly    Drug use: No   Sexual activity: Not Currently  Other Topics Concern   Not on file  Social History Narrative   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 He has one son and one granddaughter - they live nearby.   Social Determinants of Health   Financial Resource Strain: Low Risk    Difficulty of Paying Living Expenses: Not hard at all  Food Insecurity: No Food Insecurity   Worried About Charity fundraiser in the Last Year: Never true   [place] in the Last Year: Never true  Transportation Needs: No Transportation Needs   Lack of Transportation (Medical): No   Lack of Transportation (Non-Medical): No  Physical Activity: Insufficiently Active   Days of Exercise per Week: 4 days   Minutes of Exercise per Session: 30 min  Stress: No Stress Concern Present   Feeling of Stress : Not at all  Social Connections: Socially Integrated   Frequency of Communication with Friends and Family: More than three times a week   Frequency of Social Gatherings with Friends and Family: More than three  times a week   Attends Religious Services: More than 4 times per year   Active Member of [employer] or Organizations: Yes   Attends Music therapist: More than 4 times per year   Marital Status: Married  Human resources officer Violence: Not At Risk   Fear of Current or Ex-Partner: No   Emotionally Abused: No   Physically Abused: No   Sexually Abused: No    Outpatient Medications Prior to Visit  Medication Sig Dispense Refill   aspirin EC 81 MG EC tablet Take 1 tablet (81 mg total) by mouth daily.     atorvastatin (LIPITOR) 40 MG tablet TAKE 1 TABLET ONCE DAILY AT BEDTIME 90 tablet 1   donepezil (ARICEPT) 10 MG tablet Take 1 tablet (10 mg total) by mouth at bedtime. 90 tablet 1   lisinopril (ZESTRIL) 20 MG tablet Take 1 tablet (20 mg total) by mouth daily. 90 tablet 1   metoprolol tartrate (LOPRESSOR) 25 MG tablet Take 1 tablet (25 mg total) by mouth 2 (two) times daily. 180 tablet 1   nitroGLYCERIN (NITROSTAT) 0.4 MG SL tablet Place 1 tablet (0.4 mg total) under the tongue every 5 (five) minutes x 3 doses as needed for chest pain. 25 tablet 3   polyethylene glycol powder (MIRALAX) 17 GM/SCOOP powder Take 17 g by mouth 2 (two) times daily as needed for moderate constipation. 255 g 0   senna-docusate (SENOKOT-S) 8.6-50 MG tablet Take 1 tablet by mouth 2 (two) times daily between meals as needed for mild constipation. 60 tablet 0   No facility-administered medications prior to visit.    No Known Allergies  ROS Review of Systems  Constitutional: Negative.   HENT: Negative.    Eyes: Negative.   Respiratory: Negative.    Gastrointestinal: Negative.   Skin:  Negative for rash.  Neurological: Negative.   Psychiatric/Behavioral:  The patient is not nervous/anxious.   All other systems reviewed and are negative.    Objective:    Physical Exam Vitals and nursing note reviewed. Chaperone present: spouse.  Constitutional:      Appearance: Normal appearance.  HENT:     Head:  Normocephalic.     Right Ear: External ear normal.     Left Ear: External ear normal.     Nose: Nose normal.     Mouth/Throat:     Mouth: Mucous membranes are moist.     Pharynx: Oropharynx is clear.  Eyes:     Conjunctiva/sclera: Conjunctivae normal.  Cardiovascular:     Rate and Rhythm: Normal rate and regular  rhythm.     Pulses: Normal pulses.     Heart sounds: Normal heart sounds.  Pulmonary:     Effort: Pulmonary effort is normal.     Breath sounds: Normal breath sounds.  Abdominal:     General: Bowel sounds are normal.  Skin:    General: Skin is warm.     Findings: No rash.  Neurological:     Mental Status: He is alert.     Comments: Worsening dementia  Psychiatric:        Behavior: Behavior normal.    BP (!) 105/51    Pulse (!) 52    Temp (!) 96.8 F (36 C) (Temporal)    Ht 5' 9"  (1.753 m)    Wt 159 lb 9.6 oz (72.4 kg)    BMI 23.57 kg/m  Wt Readings from Last 3 Encounters:  10/19/21 159 lb 9.6 oz (72.4 kg)  08/02/21 159 lb (72.1 kg)  06/17/21 162 lb 6.4 oz (73.7 kg)     There are no preventive care reminders to display for this patient.  There are no preventive care reminders to display for this patient.  Lab Results  Component Value Date   TSH 1.321 01/29/2021   Lab Results  Component Value Date   WBC 8.8 02/04/2021   HGB 15.4 02/04/2021   HCT 44.1 02/04/2021   MCV 96.5 02/04/2021   PLT 218 02/04/2021   Lab Results  Component Value Date   NA 135 02/04/2021   K 4.3 02/04/2021   CO2 25 02/04/2021   GLUCOSE 116 (H) 02/04/2021   BUN 18 02/04/2021   CREATININE 0.91 02/04/2021   BILITOT 0.9 01/30/2021   ALKPHOS 43 01/30/2021   AST 19 01/30/2021   ALT 19 01/30/2021   PROT 5.4 (L) 01/30/2021   ALBUMIN 3.1 (L) 01/30/2021   CALCIUM 8.7 (L) 02/04/2021   ANIONGAP 7 02/04/2021   EGFR 82 01/28/2021   Lab Results  Component Value Date   CHOL 116 01/28/2021   Lab Results  Component Value Date   HDL 42 01/28/2021   Lab Results  Component Value  Date   LDLCALC 58 01/28/2021   Lab Results  Component Value Date   TRIG 77 01/28/2021   Lab Results  Component Value Date   CHOLHDL 2.8 01/28/2021   Lab Results  Component Value Date   HGBA1C 5.1 01/28/2020      Assessment & Plan:     Problem List Items Addressed This Visit       Nervous and Auditory   Dementia ([place])    Patient is continuing to experience worsening dementia.  Spouse reports that patient can no longer remember names of family and friends but he is still able to remember her name.  Completed assessment.  Patient is requiring more care for activities of daily living..  Plan is to take patient to her facility as wife is no longer able to care for patient's needs at home.  Patient is able to feed self, he is able to get into the shower but not complete his bath.        Other   Administrative encounter    Completed paper work for memory care facility.       Other Visit Diagnoses     PPD screening test    -  Primary   Relevant Orders   PPD (Completed)       No orders of the defined types were placed in this encounter.   Follow-up: No  follow-ups on file.    Ivy Lynn, NP

## 2021-10-19 NOTE — Assessment & Plan Note (Signed)
Patient is continuing to experience worsening dementia.  Spouse reports that patient can no longer remember names of family and friends but he is still able to remember her name.  Completed assessment.  Patient is requiring more care for activities of daily living..  Plan is to take patient to her facility as wife is no longer able to care for patient's needs at home.  Patient is able to feed self, he is able to get into the shower but not complete his bath.

## 2021-10-19 NOTE — Assessment & Plan Note (Signed)
Completed paper work for [facility] care facility.

## 2021-10-21 ENCOUNTER — Telehealth: Payer: Self-pay | Admitting: Nurse Practitioner

## 2021-10-21 ENCOUNTER — Ambulatory Visit: Payer: Medicare Other | Admitting: *Deleted

## 2021-10-21 DIAGNOSIS — Z111 Encounter for screening for respiratory tuberculosis: Secondary | ICD-10-CM

## 2021-10-21 LAB — TB SKIN TEST
Induration: 0 mm
TB Skin Test: NEGATIVE

## 2021-10-21 NOTE — Progress Notes (Signed)
Pt came in to get PPD reading, PPD Neg.

## 2021-10-25 NOTE — Telephone Encounter (Signed)
Checking on FL2 ppw [phone]

## 2021-10-25 NOTE — Telephone Encounter (Signed)
Patty at [facility] aware ppw being faxed now. Wife aware DNR form is at front desk for her to pick up

## 2021-10-30 DIAGNOSIS — N390 Urinary tract infection, site not specified: Secondary | ICD-10-CM | POA: Diagnosis not present

## 2021-11-01 ENCOUNTER — Telehealth: Payer: Self-pay | Admitting: *Deleted

## 2021-11-01 NOTE — Telephone Encounter (Signed)
Access Nurse to call over the weekend from skilled nursing facility that patient is having significant blood in his urine. Contacted patients wife per signed DPR.  She states they are awaiting labs before deciding next step. Encouraged patients wife to contact us if patient needs to be seen by primary care. Patient resides at [facility].  ?

## 2021-11-25 ENCOUNTER — Other Ambulatory Visit: Payer: Self-pay

## 2021-11-25 ENCOUNTER — Emergency Department (HOSPITAL_COMMUNITY): Payer: Medicare Other

## 2021-11-25 ENCOUNTER — Telehealth: Payer: Self-pay | Admitting: *Deleted

## 2021-11-25 ENCOUNTER — Emergency Department (HOSPITAL_COMMUNITY)
Admission: EM | Admit: 2021-11-25 | Discharge: 2021-11-26 | Disposition: A | Discharge status: Skilled Nursing Facility | Payer: Medicare Other | Attending: Emergency Medicine | Admitting: Emergency Medicine

## 2021-11-25 ENCOUNTER — Encounter (HOSPITAL_COMMUNITY): Payer: Self-pay

## 2021-11-25 DIAGNOSIS — Z7982 Long term (current) use of aspirin: Secondary | ICD-10-CM | POA: Insufficient documentation

## 2021-11-25 DIAGNOSIS — Z79899 Other long term (current) drug therapy: Secondary | ICD-10-CM | POA: Insufficient documentation

## 2021-11-25 DIAGNOSIS — R1030 Lower abdominal pain, unspecified: Secondary | ICD-10-CM | POA: Diagnosis not present

## 2021-11-25 DIAGNOSIS — R410 Disorientation, unspecified: Secondary | ICD-10-CM | POA: Diagnosis not present

## 2021-11-25 DIAGNOSIS — I2510 Atherosclerotic heart disease of native coronary artery without angina pectoris: Secondary | ICD-10-CM | POA: Insufficient documentation

## 2021-11-25 DIAGNOSIS — R4182 Altered mental status, unspecified: Secondary | ICD-10-CM | POA: Diagnosis not present

## 2021-11-25 DIAGNOSIS — I959 Hypotension, unspecified: Secondary | ICD-10-CM | POA: Diagnosis not present

## 2021-11-25 DIAGNOSIS — F03918 Unspecified dementia, unspecified severity, with other behavioral disturbance: Secondary | ICD-10-CM | POA: Insufficient documentation

## 2021-11-25 DIAGNOSIS — G4723 Circadian rhythm sleep disorder, irregular sleep wake type: Secondary | ICD-10-CM | POA: Diagnosis not present

## 2021-11-25 DIAGNOSIS — R531 Weakness: Secondary | ICD-10-CM | POA: Diagnosis not present

## 2021-11-25 DIAGNOSIS — I10 Essential (primary) hypertension: Secondary | ICD-10-CM | POA: Diagnosis not present

## 2021-11-25 DIAGNOSIS — Z0389 Encounter for observation for other suspected diseases and conditions ruled out: Secondary | ICD-10-CM | POA: Diagnosis not present

## 2021-11-25 DIAGNOSIS — R9431 Abnormal electrocardiogram [ECG] [EKG]: Secondary | ICD-10-CM | POA: Diagnosis not present

## 2021-11-25 LAB — URINALYSIS, ROUTINE W REFLEX MICROSCOPIC
Bacteria, UA: NONE SEEN
Bilirubin Urine: NEGATIVE
Glucose, UA: NEGATIVE mg/dL
Ketones, ur: 5 mg/dL — AB
Leukocytes,Ua: NEGATIVE
Nitrite: NEGATIVE
Protein, ur: NEGATIVE mg/dL
RBC / HPF: >50 RBC/hpf — ABNORMAL HIGH (ref 0–5)
Specific Gravity, Urine: 1.024 (ref 1.005–1.030)
pH: 5 (ref 5.0–8.0)

## 2021-11-25 LAB — TROPONIN I (HIGH SENSITIVITY)
Troponin I (High Sensitivity): 6 ng/L (ref <18)
Troponin I (High Sensitivity): 9 ng/L (ref <18)

## 2021-11-25 LAB — COMPREHENSIVE METABOLIC PANEL
ALT: 20 U/L (ref 0–44)
AST: 17 U/L (ref 15–41)
Albumin: 3.5 g/dL (ref 3.5–5.0)
Alkaline Phosphatase: 58 U/L (ref 38–126)
Anion gap: 7 (ref 5–15)
BUN: 18 mg/dL (ref 8–23)
CO2: 25 mmol/L (ref 22–32)
Calcium: 8.7 mg/dL — ABNORMAL LOW (ref 8.9–10.3)
Chloride: 109 mmol/L (ref 98–111)
Creatinine, Ser: 1.1 mg/dL (ref 0.61–1.24)
GFR, Estimated: >60 mL/min (ref >60)
Glucose, Bld: 95 mg/dL (ref 70–99)
Potassium: 4.2 mmol/L (ref 3.5–5.1)
Sodium: 141 mmol/L (ref 135–145)
Total Bilirubin: 0.9 mg/dL (ref 0.3–1.2)
Total Protein: 6.1 g/dL — ABNORMAL LOW (ref 6.5–8.1)

## 2021-11-25 LAB — CBC
HCT: 44 % (ref 39.0–52.0)
Hemoglobin: 14.7 g/dL (ref 13.0–17.0)
MCH: 33.3 pg (ref 26.0–34.0)
MCHC: 33.4 g/dL (ref 30.0–36.0)
MCV: 99.8 fL (ref 80.0–100.0)
Platelets: 188 10*3/uL (ref 150–400)
RBC: 4.41 MIL/uL (ref 4.22–5.81)
RDW: 12.2 % (ref 11.5–15.5)
WBC: 11.6 10*3/uL — ABNORMAL HIGH (ref 4.0–10.5)
nRBC: 0 % (ref 0.0–0.2)

## 2021-11-25 LAB — AMMONIA: Ammonia: 19 umol/L (ref 9–35)

## 2021-11-25 LAB — CBG MONITORING, ED: Glucose-Capillary: 81 mg/dL (ref 70–99)

## 2021-11-25 MED ORDER — SODIUM CHLORIDE 0.9 % IV BOLUS
500.0000 mL | Freq: Once | INTRAVENOUS | Status: AC
  Administered 2021-11-25: 500 mL via INTRAVENOUS

## 2021-11-25 NOTE — ED Provider Notes (Signed)
?[facility] ?Provider Note ? ? ?CSN: [id number] ?Arrival date & time: 11/25/21  1432 ? ?  ? ?History ? ?Chief Complaint  ?Patient presents with  ? Altered Mental Status  ? ? ?Adam Rowland is a 76 y.o. male.  With past medical history of hypertension, NSTEMI, CAD, dementia who presents emergency department with altered mental status. ? ?Patient sent here from [place] memory care unit for lethargy and weakness.  Per EMS he has been weak since yesterday and not following commands.  He has had difficulty ambulating and ambulates without assistance at baseline.  Apparently he was unable to take his medications this morning which he normally does without difficulty. ? ?Wife at bedside provides the rest of the history.  She states that she last saw him on Sunday and he was like his normal self.  She states that this morning she went to visit him and immediately could tell a difference in his demeanor.  She states that he is less verbal than normal as well as less interactive.  She states that she had to help him stand up and take a few steps which is not normal for him.  She states that he has not been wanting to eat.  She also states that the nurse called EMS because the patient's blood pressure dropped. ? ?HPI ? ?  ? ?Home Medications ?Prior to Admission medications   ?Medication Sig Start Date End Date Taking? Authorizing Provider  ?aspirin EC 81 MG EC tablet Take 1 tablet (81 mg total) by mouth daily. 06/21/14  Yes Theora Gianotti, NP  ?atorvastatin (LIPITOR) 40 MG tablet TAKE 1 TABLET ONCE DAILY AT BEDTIME ?Patient taking differently: Take 40 mg by mouth at bedtime. 08/02/21  Yes Chevis Pretty, FNP  ?donepezil (ARICEPT) 10 MG tablet Take 1 tablet (10 mg total) by mouth at bedtime. 08/02/21  Yes Chevis Pretty, FNP  ?lisinopril (ZESTRIL) 20 MG tablet Take 1 tablet (20 mg total) by mouth daily. 08/02/21  Yes Chevis Pretty, FNP  ?metoprolol tartrate  (LOPRESSOR) 25 MG tablet Take 1 tablet (25 mg total) by mouth 2 (two) times daily. 08/02/21  Yes Hassell Done, [name], FNP  ?nitroGLYCERIN (NITROSTAT) 0.4 MG SL tablet Place 1 tablet (0.4 mg total) under the tongue every 5 (five) minutes x 3 doses as needed for chest pain. 12/01/16  Yes Chevis Pretty, FNP  ?polyethylene glycol powder (MIRALAX) 17 GM/SCOOP powder Take 17 g by mouth 2 (two) times daily as needed for moderate constipation. 02/04/21  Yes Mercy Riding, MD  ?senna-docusate (SENOKOT-S) 8.6-50 MG tablet Take 1 tablet by mouth 2 (two) times daily between meals as needed for mild constipation. 02/04/21  Yes Mercy Riding, MD  ?   ? ?Allergies    ?Patient has no known allergies.   ? ?Review of Systems   ?Review of Systems  ?Unable to perform ROS: Dementia  ? ?Physical Exam ?Updated Vital Signs ?BP 100/62   Pulse 81   Temp 98.8 ?F (37.1 ?C)   Resp (!) 24   SpO2 98%  ?Physical Exam ?Vitals and nursing note reviewed. Exam conducted with a chaperone present.  ?Constitutional:   ?   General: He is not in acute distress. ?   Appearance: He is normal weight. He is ill-appearing. He is not toxic-appearing.  ?HENT:  ?   Head: Normocephalic and atraumatic.  ?   Nose: Nose normal.  ?   Mouth/Throat:  ?   Mouth: Mucous membranes are  dry.  ?   Pharynx: Oropharynx is clear.  ?Eyes:  ?   General: No scleral icterus. ?   Extraocular Movements: Extraocular movements intact.  ?   Conjunctiva/sclera: Conjunctivae normal.  ?   Pupils: Pupils are equal, round, and reactive to light.  ?Cardiovascular:  ?   Rate and Rhythm: Normal rate. Rhythm irregular.  ?   Pulses: Normal pulses.  ?   Heart sounds: No murmur heard. ?Pulmonary:  ?   Effort: Pulmonary effort is normal. No respiratory distress.  ?   Breath sounds: Normal breath sounds.  ?Abdominal:  ?   General: Abdomen is flat. Bowel sounds are normal. There is no distension.  ?   Palpations: Abdomen is soft.  ?   Tenderness: There is abdominal tenderness in the suprapubic  area. There is no guarding or rebound.  ?Genitourinary: ?   Rectum: Normal.  ?Musculoskeletal:     ?   General: No tenderness.  ?   Cervical back: Neck supple.  ?   Right lower leg: Edema present.  ?   Left lower leg: Edema present.  ?Skin: ?   General: Skin is warm and dry.  ?   Capillary Refill: Capillary refill takes less than 2 seconds.  ?   Comments: Stage II decubitus ulcer  ?Neurological:  ?   General: No focal deficit present.  ?   Mental Status: He is lethargic, disoriented and confused.  ?   Cranial Nerves: No cranial nerve deficit or facial asymmetry.  ?   Motor: No seizure activity.  ?   Comments: Oriented to self  ?Psychiatric:     ?   Mood and Affect: Affect is blunt.     ?   Cognition and Memory: Cognition is impaired.  ? ? ?ED Results / Procedures / Treatments   ?Labs ?(all labs ordered are listed, but only abnormal results are displayed) ?Labs Reviewed  ?COMPREHENSIVE METABOLIC PANEL - Abnormal; Notable for the following components:  ?    Result Value  ? Calcium 8.7 (*)   ? Total Protein 6.1 (*)   ? All other components within normal limits  ?CBC - Abnormal; Notable for the following components:  ? WBC 11.6 (*)   ? All other components within normal limits  ?URINALYSIS, ROUTINE W REFLEX MICROSCOPIC - Abnormal; Notable for the following components:  ? Hgb urine dipstick MODERATE (*)   ? Ketones, ur 5 (*)   ? RBC / HPF >50 (*)   ? All other components within normal limits  ?AMMONIA  ?CBG MONITORING, ED  ?TROPONIN I (HIGH SENSITIVITY)  ?TROPONIN I (HIGH SENSITIVITY)  ? ?EKG ?EKG Interpretation ? ?Date/Time:  Thursday November 25 2021 14:40:48 EDT ?Ventricular Rate:  87 ?PR Interval:    ?QRS Duration: 105 ?QT Interval:  360 ?QTC Calculation: 451 ?R Axis:   45 ?Text Interpretation: Atrial flutter Abnormal R-wave progression, late transition Minimal ST depression, inferior leads Confirmed by Wynona Dove [id number]) on 11/25/2021 7:44:39 PM ? ?Radiology ?DG Abdomen 1 View ? ?Result Date: 11/25/2021 ?CLINICAL DATA:   KUB for MRI EXAM: ABDOMEN - 1 VIEW COMPARISON:  None. FINDINGS: Please note that the lateral aspects of the upper abdomen are collimated off view. The bowel gas pattern is normal. No radio-opaque calculi or other significant radiographic abnormality are seen. No retained radiopaque foreign body. Lines overlie the patient. IMPRESSION: Please note that the lateral aspects of the upper abdomen are collimated off view. Negative. Electronically Signed   By: Clelia Croft.D.  On: 11/25/2021 23:54  ? ?CT Head Wo Contrast ? ?Result Date: 11/25/2021 ?CLINICAL DATA:  Weakness, lethargy, not following commands, mental status changes of unknown cause, history hypertension, coronary artery disease post MI, ischemic cardiomyopathy, dementia EXAM: CT HEAD WITHOUT CONTRAST TECHNIQUE: Contiguous axial images were obtained from the base of the skull through the vertex without intravenous contrast. RADIATION DOSE REDUCTION: This exam was performed according to the departmental dose-optimization program which includes automated exposure control, adjustment of the mA and/or kV according to patient size and/or use of iterative reconstruction technique. COMPARISON:  01/29/2021 FINDINGS: Brain: Generalized atrophy. Normal ventricular morphology. No midline shift or mass effect. Otherwise normal appearance of brain parenchyma. No intracranial hemorrhage, mass lesion, evidence of acute infarction, or extra-axial fluid collection. Vascular: No hyperdense vessels. Atherosclerotic calcification of internal carotid arteries bilaterally at skull base Skull: Intact Sinuses/Orbits: Prior sinus surgery. Scattered mucosal thickening along a residual osseous margins. Other: N/A IMPRESSION: Generalized atrophy. No acute intracranial abnormalities. Electronically Signed   By: Lavonia Dana M.D.   On: 11/25/2021 17:59  ? ?DG Chest Port 1 View ? ?Result Date: 11/25/2021 ?CLINICAL DATA:  Altered mental status EXAM: PORTABLE CHEST 1 VIEW COMPARISON:   Portable exam 1727 hours compared to 01/29/2021 FINDINGS: Normal heart size, mediastinal contours, and pulmonary vascularity. Lungs clear. No pulmonary infiltrate, pleural effusion, or pneumothorax. Bones appear

## 2021-11-25 NOTE — ED Notes (Signed)
Pt to xray

## 2021-11-25 NOTE — ED Triage Notes (Signed)
Pt BIB [facility] from [facility]-memory care unit, here with his facility stating he has been having weakness, more lethargic, & not following commands. Last normal yesterday, no ambulation assistance at baseline. A-fib/A flutter, 104/54, CBG 157, 98% RA, 16 Resp.  ?

## 2021-11-25 NOTE — Telephone Encounter (Signed)
TC from [facility] w/ [facility] ?Pt is a resident there now. This morning pt was more disoriented that normal, not following directions, not eating, she has to put his medications in his mouth. After lunch he was not able to stand on his own, they had to pick him up by his clothing. She then took his BP which was 74/56. Called with information and advise if they should sent him out. Instructed to send him on to ED to be evaluated. ?

## 2021-11-26 ENCOUNTER — Emergency Department (HOSPITAL_COMMUNITY): Payer: Medicare Other

## 2021-11-26 DIAGNOSIS — R4182 Altered mental status, unspecified: Secondary | ICD-10-CM | POA: Diagnosis not present

## 2021-11-26 NOTE — ED Notes (Signed)
PT discharged from hospital and back to [facility] via [facility]  ?

## 2021-11-26 NOTE — ED Notes (Signed)
[facility] here to get pt - [facility] called  ?

## 2021-11-26 NOTE — ED Notes (Signed)
Patient transported to MRI 

## 2021-11-26 NOTE — ED Notes (Signed)
Report given to [facility] - would like a  call when pt is being transported back  ?

## 2021-11-26 NOTE — Discharge Instructions (Signed)
You were evaluated in the Emergency Department and after careful evaluation, we did not find any emergent condition requiring admission or further testing in the hospital. ? ?Your exam/testing today is overall reassuring.  Recommend follow-up with your regular doctors if symptoms continue. ? ?Please return to the Emergency Department if you experience any worsening of your condition.   Thank you for allowing [place] to be a part of your care. ?

## 2021-11-26 NOTE — ED Provider Notes (Signed)
?  Provider Note ?MRN:  [id number]  ?Arrival date & time: 11/26/21    ?ED Course and Medical Decision Making  ?Assumed care from PA Autry at shift change. ? ?Acute functional decline over the past few days.  History of dementia but can normally walk and feed self.  Unable to do so.  Awaiting MRI. ? ?MRI is without acute process.  On my assessment patient wakes easily and is conversant, moves all extremities, continued reassuring vital signs.  Spoke with patient's spouse, has had fairly minimal change to behavior, needs to be reminded to eat, having a little more trouble standing in a low chair.  Appropriate for discharge. ? ?Procedures ? ?Final Clinical Impressions(s) / ED Diagnoses  ? ?  ICD-10-CM   ?1. Dementia with other behavioral disturbance, unspecified dementia severity, unspecified dementia type  F03.918   ?  ?  ?ED Discharge Orders   ? ? None  ? ?  ?  ? ? ?Discharge Instructions   ? ?  ?You were evaluated in the Emergency Department and after careful evaluation, we did not find any emergent condition requiring admission or further testing in the hospital. ? ?Your exam/testing today is overall reassuring.  Recommend follow-up with your regular doctors if symptoms continue. ? ?Please return to the Emergency Department if you experience any worsening of your condition.   Thank you for allowing [place] to be a part of your care. ? ? ? ? ? ?Elmer Sow. Pilar Plate, MD ?[facility] Emergency Medicine ?[facility] [email or web address] ? ?  ?Sabas Sous, MD ?11/26/21 [phone] ? ?

## 2021-12-21 DIAGNOSIS — I482 Chronic atrial fibrillation, unspecified: Secondary | ICD-10-CM | POA: Diagnosis not present

## 2021-12-21 DIAGNOSIS — I10 Essential (primary) hypertension: Secondary | ICD-10-CM | POA: Diagnosis not present

## 2021-12-21 DIAGNOSIS — E782 Mixed hyperlipidemia: Secondary | ICD-10-CM | POA: Diagnosis not present

## 2021-12-21 DIAGNOSIS — I255 Ischemic cardiomyopathy: Secondary | ICD-10-CM | POA: Diagnosis not present

## 2021-12-22 DIAGNOSIS — I482 Chronic atrial fibrillation, unspecified: Secondary | ICD-10-CM | POA: Diagnosis not present

## 2021-12-22 DIAGNOSIS — I959 Hypotension, unspecified: Secondary | ICD-10-CM | POA: Diagnosis not present

## 2021-12-22 DIAGNOSIS — E782 Mixed hyperlipidemia: Secondary | ICD-10-CM | POA: Diagnosis not present

## 2021-12-22 DIAGNOSIS — I10 Essential (primary) hypertension: Secondary | ICD-10-CM | POA: Diagnosis not present

## 2021-12-22 DIAGNOSIS — R32 Unspecified urinary incontinence: Secondary | ICD-10-CM | POA: Diagnosis not present

## 2021-12-22 DIAGNOSIS — E860 Dehydration: Secondary | ICD-10-CM | POA: Diagnosis not present

## 2021-12-22 DIAGNOSIS — F03918 Unspecified dementia, unspecified severity, with other behavioral disturbance: Secondary | ICD-10-CM | POA: Diagnosis not present

## 2021-12-22 DIAGNOSIS — I2510 Atherosclerotic heart disease of native coronary artery without angina pectoris: Secondary | ICD-10-CM | POA: Diagnosis not present

## 2021-12-22 DIAGNOSIS — Z7982 Long term (current) use of aspirin: Secondary | ICD-10-CM | POA: Diagnosis not present

## 2021-12-22 DIAGNOSIS — I4892 Unspecified atrial flutter: Secondary | ICD-10-CM | POA: Diagnosis not present

## 2021-12-22 DIAGNOSIS — Z9181 History of falling: Secondary | ICD-10-CM | POA: Diagnosis not present

## 2021-12-22 DIAGNOSIS — I252 Old myocardial infarction: Secondary | ICD-10-CM | POA: Diagnosis not present

## 2021-12-22 DIAGNOSIS — I255 Ischemic cardiomyopathy: Secondary | ICD-10-CM | POA: Diagnosis not present

## 2021-12-29 DIAGNOSIS — E559 Vitamin D deficiency, unspecified: Secondary | ICD-10-CM | POA: Diagnosis not present

## 2021-12-29 DIAGNOSIS — E119 Type 2 diabetes mellitus without complications: Secondary | ICD-10-CM | POA: Diagnosis not present

## 2021-12-29 DIAGNOSIS — I10 Essential (primary) hypertension: Secondary | ICD-10-CM | POA: Diagnosis not present

## 2021-12-29 DIAGNOSIS — D519 Vitamin B12 deficiency anemia, unspecified: Secondary | ICD-10-CM | POA: Diagnosis not present

## 2021-12-29 DIAGNOSIS — E039 Hypothyroidism, unspecified: Secondary | ICD-10-CM | POA: Diagnosis not present

## 2021-12-29 DIAGNOSIS — E782 Mixed hyperlipidemia: Secondary | ICD-10-CM | POA: Diagnosis not present

## 2021-12-30 DIAGNOSIS — I482 Chronic atrial fibrillation, unspecified: Secondary | ICD-10-CM | POA: Diagnosis not present

## 2021-12-30 DIAGNOSIS — E782 Mixed hyperlipidemia: Secondary | ICD-10-CM | POA: Diagnosis not present

## 2021-12-30 DIAGNOSIS — I10 Essential (primary) hypertension: Secondary | ICD-10-CM | POA: Diagnosis not present

## 2021-12-30 DIAGNOSIS — I255 Ischemic cardiomyopathy: Secondary | ICD-10-CM | POA: Diagnosis not present

## 2022-01-03 DIAGNOSIS — L219 Seborrheic dermatitis, unspecified: Secondary | ICD-10-CM | POA: Diagnosis not present

## 2022-01-03 DIAGNOSIS — E559 Vitamin D deficiency, unspecified: Secondary | ICD-10-CM | POA: Diagnosis not present

## 2022-01-12 DIAGNOSIS — I2510 Atherosclerotic heart disease of native coronary artery without angina pectoris: Secondary | ICD-10-CM | POA: Diagnosis not present

## 2022-01-12 DIAGNOSIS — F03918 Unspecified dementia, unspecified severity, with other behavioral disturbance: Secondary | ICD-10-CM | POA: Diagnosis not present

## 2022-01-12 DIAGNOSIS — E782 Mixed hyperlipidemia: Secondary | ICD-10-CM | POA: Diagnosis not present

## 2022-01-17 DIAGNOSIS — N189 Chronic kidney disease, unspecified: Secondary | ICD-10-CM | POA: Diagnosis not present

## 2022-01-17 DIAGNOSIS — I129 Hypertensive chronic kidney disease with stage 1 through stage 4 chronic kidney disease, or unspecified chronic kidney disease: Secondary | ICD-10-CM | POA: Diagnosis not present

## 2022-01-17 DIAGNOSIS — I480 Paroxysmal atrial fibrillation: Secondary | ICD-10-CM | POA: Diagnosis not present

## 2022-01-20 DIAGNOSIS — G300 Alzheimer's disease with early onset: Secondary | ICD-10-CM | POA: Diagnosis not present

## 2022-01-20 DIAGNOSIS — F4321 Adjustment disorder with depressed mood: Secondary | ICD-10-CM | POA: Diagnosis not present

## 2022-01-29 DIAGNOSIS — F02B0 Dementia in other diseases classified elsewhere, moderate, without behavioral disturbance, psychotic disturbance, mood disturbance, and anxiety: Secondary | ICD-10-CM | POA: Diagnosis not present

## 2022-01-29 DIAGNOSIS — F4321 Adjustment disorder with depressed mood: Secondary | ICD-10-CM | POA: Diagnosis not present

## 2022-01-31 ENCOUNTER — Ambulatory Visit: Payer: Medicare Other | Admitting: Nurse Practitioner

## 2022-01-31 ENCOUNTER — Ambulatory Visit: Payer: Medicare Other

## 2022-02-14 DIAGNOSIS — R634 Abnormal weight loss: Secondary | ICD-10-CM | POA: Diagnosis not present

## 2022-02-14 DIAGNOSIS — I129 Hypertensive chronic kidney disease with stage 1 through stage 4 chronic kidney disease, or unspecified chronic kidney disease: Secondary | ICD-10-CM | POA: Diagnosis not present

## 2022-02-14 DIAGNOSIS — E559 Vitamin D deficiency, unspecified: Secondary | ICD-10-CM | POA: Diagnosis not present

## 2022-03-03 DIAGNOSIS — I129 Hypertensive chronic kidney disease with stage 1 through stage 4 chronic kidney disease, or unspecified chronic kidney disease: Secondary | ICD-10-CM | POA: Diagnosis not present

## 2022-03-03 DIAGNOSIS — L219 Seborrheic dermatitis, unspecified: Secondary | ICD-10-CM | POA: Diagnosis not present

## 2022-03-03 DIAGNOSIS — Z6825 Body mass index (BMI) 25.0-25.9, adult: Secondary | ICD-10-CM | POA: Diagnosis not present

## 2022-03-03 DIAGNOSIS — R634 Abnormal weight loss: Secondary | ICD-10-CM | POA: Diagnosis not present

## 2022-03-03 DIAGNOSIS — R32 Unspecified urinary incontinence: Secondary | ICD-10-CM | POA: Diagnosis not present

## 2022-03-03 DIAGNOSIS — L89312 Pressure ulcer of right buttock, stage 2: Secondary | ICD-10-CM | POA: Diagnosis not present

## 2022-03-03 DIAGNOSIS — G9341 Metabolic encephalopathy: Secondary | ICD-10-CM | POA: Diagnosis not present

## 2022-03-03 DIAGNOSIS — Z7982 Long term (current) use of aspirin: Secondary | ICD-10-CM | POA: Diagnosis not present

## 2022-03-03 DIAGNOSIS — I255 Ischemic cardiomyopathy: Secondary | ICD-10-CM | POA: Diagnosis not present

## 2022-03-03 DIAGNOSIS — N189 Chronic kidney disease, unspecified: Secondary | ICD-10-CM | POA: Diagnosis not present

## 2022-03-03 DIAGNOSIS — F03918 Unspecified dementia, unspecified severity, with other behavioral disturbance: Secondary | ICD-10-CM | POA: Diagnosis not present

## 2022-03-03 DIAGNOSIS — E559 Vitamin D deficiency, unspecified: Secondary | ICD-10-CM | POA: Diagnosis not present

## 2022-03-03 DIAGNOSIS — E785 Hyperlipidemia, unspecified: Secondary | ICD-10-CM | POA: Diagnosis not present

## 2022-03-03 DIAGNOSIS — I4891 Unspecified atrial fibrillation: Secondary | ICD-10-CM | POA: Diagnosis not present

## 2022-03-18 DIAGNOSIS — F02B0 Dementia in other diseases classified elsewhere, moderate, without behavioral disturbance, psychotic disturbance, mood disturbance, and anxiety: Secondary | ICD-10-CM | POA: Diagnosis not present

## 2022-03-18 DIAGNOSIS — G300 Alzheimer's disease with early onset: Secondary | ICD-10-CM | POA: Diagnosis not present

## 2022-03-21 DIAGNOSIS — E782 Mixed hyperlipidemia: Secondary | ICD-10-CM | POA: Diagnosis not present

## 2022-03-21 DIAGNOSIS — I255 Ischemic cardiomyopathy: Secondary | ICD-10-CM | POA: Diagnosis not present

## 2022-03-21 DIAGNOSIS — I482 Chronic atrial fibrillation, unspecified: Secondary | ICD-10-CM | POA: Diagnosis not present

## 2022-03-21 DIAGNOSIS — L89312 Pressure ulcer of right buttock, stage 2: Secondary | ICD-10-CM | POA: Diagnosis not present

## 2022-03-24 DIAGNOSIS — I214 Non-ST elevation (NSTEMI) myocardial infarction: Secondary | ICD-10-CM | POA: Diagnosis not present

## 2022-03-24 DIAGNOSIS — F4321 Adjustment disorder with depressed mood: Secondary | ICD-10-CM | POA: Diagnosis not present

## 2022-04-02 DIAGNOSIS — Z7982 Long term (current) use of aspirin: Secondary | ICD-10-CM | POA: Diagnosis not present

## 2022-04-02 DIAGNOSIS — L89312 Pressure ulcer of right buttock, stage 2: Secondary | ICD-10-CM | POA: Diagnosis not present

## 2022-04-02 DIAGNOSIS — Z6825 Body mass index (BMI) 25.0-25.9, adult: Secondary | ICD-10-CM | POA: Diagnosis not present

## 2022-04-02 DIAGNOSIS — L219 Seborrheic dermatitis, unspecified: Secondary | ICD-10-CM | POA: Diagnosis not present

## 2022-04-02 DIAGNOSIS — I129 Hypertensive chronic kidney disease with stage 1 through stage 4 chronic kidney disease, or unspecified chronic kidney disease: Secondary | ICD-10-CM | POA: Diagnosis not present

## 2022-04-02 DIAGNOSIS — G9341 Metabolic encephalopathy: Secondary | ICD-10-CM | POA: Diagnosis not present

## 2022-04-02 DIAGNOSIS — N189 Chronic kidney disease, unspecified: Secondary | ICD-10-CM | POA: Diagnosis not present

## 2022-04-02 DIAGNOSIS — E785 Hyperlipidemia, unspecified: Secondary | ICD-10-CM | POA: Diagnosis not present

## 2022-04-02 DIAGNOSIS — I4891 Unspecified atrial fibrillation: Secondary | ICD-10-CM | POA: Diagnosis not present

## 2022-04-02 DIAGNOSIS — R32 Unspecified urinary incontinence: Secondary | ICD-10-CM | POA: Diagnosis not present

## 2022-04-02 DIAGNOSIS — F03918 Unspecified dementia, unspecified severity, with other behavioral disturbance: Secondary | ICD-10-CM | POA: Diagnosis not present

## 2022-04-02 DIAGNOSIS — R634 Abnormal weight loss: Secondary | ICD-10-CM | POA: Diagnosis not present

## 2022-04-02 DIAGNOSIS — E559 Vitamin D deficiency, unspecified: Secondary | ICD-10-CM | POA: Diagnosis not present

## 2022-04-02 DIAGNOSIS — I255 Ischemic cardiomyopathy: Secondary | ICD-10-CM | POA: Diagnosis not present

## 2022-04-15 ENCOUNTER — Encounter (HOSPITAL_BASED_OUTPATIENT_CLINIC_OR_DEPARTMENT_OTHER): Payer: Self-pay

## 2022-04-15 ENCOUNTER — Other Ambulatory Visit: Payer: Self-pay

## 2022-04-15 ENCOUNTER — Emergency Department (HOSPITAL_BASED_OUTPATIENT_CLINIC_OR_DEPARTMENT_OTHER)
Admission: EM | Admit: 2022-04-15 | Discharge: 2022-04-15 | Disposition: A | Discharge status: Skilled Nursing Facility | Payer: Medicare Other | Attending: Emergency Medicine | Admitting: Emergency Medicine

## 2022-04-15 ENCOUNTER — Emergency Department (HOSPITAL_BASED_OUTPATIENT_CLINIC_OR_DEPARTMENT_OTHER): Payer: Medicare Other

## 2022-04-15 DIAGNOSIS — S51011A Laceration without foreign body of right elbow, initial encounter: Secondary | ICD-10-CM | POA: Diagnosis not present

## 2022-04-15 DIAGNOSIS — S0083XA Contusion of other part of head, initial encounter: Secondary | ICD-10-CM | POA: Insufficient documentation

## 2022-04-15 DIAGNOSIS — Y92129 Unspecified place in nursing home as the place of occurrence of the external cause: Secondary | ICD-10-CM | POA: Diagnosis not present

## 2022-04-15 DIAGNOSIS — W06XXXA Fall from bed, initial encounter: Secondary | ICD-10-CM | POA: Insufficient documentation

## 2022-04-15 DIAGNOSIS — Z7401 Bed confinement status: Secondary | ICD-10-CM | POA: Diagnosis not present

## 2022-04-15 DIAGNOSIS — Z043 Encounter for examination and observation following other accident: Secondary | ICD-10-CM | POA: Diagnosis not present

## 2022-04-15 DIAGNOSIS — S0990XA Unspecified injury of head, initial encounter: Secondary | ICD-10-CM | POA: Diagnosis not present

## 2022-04-15 DIAGNOSIS — Z23 Encounter for immunization: Secondary | ICD-10-CM | POA: Insufficient documentation

## 2022-04-15 DIAGNOSIS — F0390 Unspecified dementia without behavioral disturbance: Secondary | ICD-10-CM | POA: Diagnosis not present

## 2022-04-15 DIAGNOSIS — S0003XA Contusion of scalp, initial encounter: Secondary | ICD-10-CM | POA: Diagnosis not present

## 2022-04-15 DIAGNOSIS — Z79899 Other long term (current) drug therapy: Secondary | ICD-10-CM | POA: Diagnosis not present

## 2022-04-15 DIAGNOSIS — R4182 Altered mental status, unspecified: Secondary | ICD-10-CM | POA: Diagnosis not present

## 2022-04-15 DIAGNOSIS — Z7982 Long term (current) use of aspirin: Secondary | ICD-10-CM | POA: Diagnosis not present

## 2022-04-15 DIAGNOSIS — W19XXXA Unspecified fall, initial encounter: Secondary | ICD-10-CM | POA: Diagnosis not present

## 2022-04-15 DIAGNOSIS — R509 Fever, unspecified: Secondary | ICD-10-CM | POA: Diagnosis not present

## 2022-04-15 DIAGNOSIS — S59901A Unspecified injury of right elbow, initial encounter: Secondary | ICD-10-CM | POA: Diagnosis present

## 2022-04-15 DIAGNOSIS — M47812 Spondylosis without myelopathy or radiculopathy, cervical region: Secondary | ICD-10-CM | POA: Diagnosis not present

## 2022-04-15 HISTORY — DX: Non-ST elevation (NSTEMI) myocardial infarction: I21.4

## 2022-04-15 HISTORY — DX: Metabolic encephalopathy: G93.41

## 2022-04-15 HISTORY — DX: Unspecified dementia, unspecified severity, without behavioral disturbance, psychotic disturbance, mood disturbance, and anxiety: F03.90

## 2022-04-15 MED ORDER — TETANUS-DIPHTH-ACELL PERTUSSIS 5-2.5-18.5 LF-MCG/0.5 IM SUSY
0.5000 mL | PREFILLED_SYRINGE | Freq: Once | INTRAMUSCULAR | Status: AC
  Administered 2022-04-15: 0.5 mL via INTRAMUSCULAR
  Filled 2022-04-15: qty 0.5

## 2022-04-15 NOTE — ED Provider Notes (Signed)
[facility] EMERGENCY DEPT  Provider Note  CSN: [id number] Arrival date & time: 04/15/22 0457  History Chief Complaint  Patient presents with   Adam Rowland is a 76 y.o. male with history of dementia brought to the ED via EMS for evaluation of an unwitnessed fall at ALF. EMS reports staff found him on the floor of his room. He reports he tried to get out of bed and fell. His roommate called for help. He denies any complaints now. No blood thinners on MAR from facility.    Home Medications Prior to Admission medications   Medication Sig Start Date End Date Taking? Authorizing Provider  aspirin EC 81 MG EC tablet Take 1 tablet (81 mg total) by mouth daily. 06/21/14   Creig Hines, NP  atorvastatin (LIPITOR) 40 MG tablet TAKE 1 TABLET ONCE DAILY AT BEDTIME Patient taking differently: Take 40 mg by mouth at bedtime. 08/02/21   Daphine Deutscher, [name], FNP  donepezil (ARICEPT) 10 MG tablet Take 1 tablet (10 mg total) by mouth at bedtime. 08/02/21   Daphine Deutscher, [name], FNP  lisinopril (ZESTRIL) 20 MG tablet Take 1 tablet (20 mg total) by mouth daily. 08/02/21   Daphine Deutscher [name], FNP  metoprolol tartrate (LOPRESSOR) 25 MG tablet Take 1 tablet (25 mg total) by mouth 2 (two) times daily. 08/02/21   Daphine Deutscher, [name], FNP  nitroGLYCERIN (NITROSTAT) 0.4 MG SL tablet Place 1 tablet (0.4 mg total) under the tongue every 5 (five) minutes x 3 doses as needed for chest pain. 12/01/16   Daphine Deutscher, [name], FNP  polyethylene glycol powder (MIRALAX) 17 GM/SCOOP powder Take 17 g by mouth 2 (two) times daily as needed for moderate constipation. 02/04/21   Almon Hercules, MD  senna-docusate (SENOKOT-S) 8.6-50 MG tablet Take 1 tablet by mouth 2 (two) times daily between meals as needed for mild constipation. 02/04/21   Almon Hercules, MD     Allergies    Patient has no known allergies.   Review of Systems   Review of Systems Please see HPI for pertinent positives and  negatives  Physical Exam BP (!) 150/68 (BP Location: Right Arm)   Pulse 62   Temp 98.1 F (36.7 C) (Oral)   Resp 16   Ht 5\' 9"  (1.753 m)   Wt 71.2 kg   SpO2 100%   BMI 23.18 kg/m   Physical Exam Vitals and nursing note reviewed.  Constitutional:      Appearance: Normal appearance.  HENT:     Head: Normocephalic.     Comments: Hematoma/abrasion over the R forehead    Nose: Nose normal.     Mouth/Throat:     Mouth: Mucous membranes are moist.  Eyes:     Extraocular Movements: Extraocular movements intact.     Conjunctiva/sclera: Conjunctivae normal.  Neck:     Comments: In c-collar Cardiovascular:     Rate and Rhythm: Normal rate.  Pulmonary:     Effort: Pulmonary effort is normal.     Breath sounds: Normal breath sounds.  Abdominal:     General: Abdomen is flat.     Palpations: Abdomen is soft.     Tenderness: There is no abdominal tenderness.  Musculoskeletal:        General: No swelling. Normal range of motion.  Skin:    General: Skin is warm and dry.     Comments: Skin tear, R elbow  Neurological:     General: No focal deficit present.  Mental Status: He is alert.  Psychiatric:        Mood and Affect: Mood normal.     ED Results / Procedures / Treatments   EKG None  Procedures Procedures  Medications Ordered in the ED Medications  Tdap (BOOSTRIX) injection 0.5 mL (0.5 mLs Intramuscular Given 04/15/22 0537)    Initial Impression and Plan  Patient here after unwitnessed fall. He states he fell getting out of bed. Exam is benign aside from head injury. Will check head/C-spine CT and continue to monitor in the ED.   ED Course   Clinical Course as of 04/15/22 3007  Caleen Essex Apr 15, 2022  0605 I personally viewed the images from radiology studies and agree with radiologist interpretation: CT are neg for acute injury. Wounds were dressed. Remains awake and alert without complaint. Plan discharge back to [facility].   [CS]    Clinical Course User Index [CS]  Pollyann Savoy, MD     MDM Rules/Calculators/A&P Medical Decision Making Problems Addressed: Fall, initial encounter: acute illness or injury Head injuries, initial encounter: acute illness or injury  Amount and/or Complexity of Data Reviewed Radiology: ordered and independent interpretation performed. Decision-making details documented in ED Course.  Risk Prescription drug management.    Final Clinical Impression(s) / ED Diagnoses Final diagnoses:  Fall, initial encounter  Head injuries, initial encounter    Rx / DC Orders ED Discharge Orders     None        Pollyann Savoy, MD 04/15/22 [phone]

## 2022-04-15 NOTE — ED Notes (Signed)
Pt now returned from CT dept via stretcher escorted by this nurse- no acute changes noted

## 2022-04-15 NOTE — ED Triage Notes (Signed)
Pt arrives from dementia unit of [facility]- s/p unwitnessed fall; unk downtime; unk LOC.  Pt not known to be on bloodthinners.  R forehead hematoma and R elbow skin tear open to air upon arrival; no active bleeding.

## 2022-04-15 NOTE — ED Notes (Signed)
ED Provider at bedside.  C-Collar cleared by Dr. Bernette Mayers at this time

## 2022-04-18 DIAGNOSIS — E559 Vitamin D deficiency, unspecified: Secondary | ICD-10-CM | POA: Diagnosis not present

## 2022-04-18 DIAGNOSIS — I10 Essential (primary) hypertension: Secondary | ICD-10-CM | POA: Diagnosis not present

## 2022-04-18 DIAGNOSIS — S51011A Laceration without foreign body of right elbow, initial encounter: Secondary | ICD-10-CM | POA: Diagnosis not present

## 2022-04-18 DIAGNOSIS — L219 Seborrheic dermatitis, unspecified: Secondary | ICD-10-CM | POA: Diagnosis not present

## 2022-04-22 DIAGNOSIS — L89312 Pressure ulcer of right buttock, stage 2: Secondary | ICD-10-CM | POA: Diagnosis not present

## 2022-04-22 DIAGNOSIS — F03918 Unspecified dementia, unspecified severity, with other behavioral disturbance: Secondary | ICD-10-CM | POA: Diagnosis not present

## 2022-04-22 DIAGNOSIS — E785 Hyperlipidemia, unspecified: Secondary | ICD-10-CM | POA: Diagnosis not present

## 2022-04-25 DIAGNOSIS — I10 Essential (primary) hypertension: Secondary | ICD-10-CM | POA: Diagnosis not present

## 2022-04-28 DIAGNOSIS — F4321 Adjustment disorder with depressed mood: Secondary | ICD-10-CM | POA: Diagnosis not present

## 2022-04-28 DIAGNOSIS — E785 Hyperlipidemia, unspecified: Secondary | ICD-10-CM | POA: Diagnosis not present

## 2022-04-29 DIAGNOSIS — F02B0 Dementia in other diseases classified elsewhere, moderate, without behavioral disturbance, psychotic disturbance, mood disturbance, and anxiety: Secondary | ICD-10-CM | POA: Diagnosis not present

## 2022-04-29 DIAGNOSIS — G300 Alzheimer's disease with early onset: Secondary | ICD-10-CM | POA: Diagnosis not present

## 2022-05-02 DIAGNOSIS — M1990 Unspecified osteoarthritis, unspecified site: Secondary | ICD-10-CM | POA: Diagnosis not present

## 2022-05-02 DIAGNOSIS — R32 Unspecified urinary incontinence: Secondary | ICD-10-CM | POA: Diagnosis not present

## 2022-05-02 DIAGNOSIS — E785 Hyperlipidemia, unspecified: Secondary | ICD-10-CM | POA: Diagnosis not present

## 2022-05-02 DIAGNOSIS — E559 Vitamin D deficiency, unspecified: Secondary | ICD-10-CM | POA: Diagnosis not present

## 2022-05-02 DIAGNOSIS — Z7982 Long term (current) use of aspirin: Secondary | ICD-10-CM | POA: Diagnosis not present

## 2022-05-02 DIAGNOSIS — F03918 Unspecified dementia, unspecified severity, with other behavioral disturbance: Secondary | ICD-10-CM | POA: Diagnosis not present

## 2022-05-02 DIAGNOSIS — I255 Ischemic cardiomyopathy: Secondary | ICD-10-CM | POA: Diagnosis not present

## 2022-05-02 DIAGNOSIS — I129 Hypertensive chronic kidney disease with stage 1 through stage 4 chronic kidney disease, or unspecified chronic kidney disease: Secondary | ICD-10-CM | POA: Diagnosis not present

## 2022-05-02 DIAGNOSIS — L219 Seborrheic dermatitis, unspecified: Secondary | ICD-10-CM | POA: Diagnosis not present

## 2022-05-02 DIAGNOSIS — L89312 Pressure ulcer of right buttock, stage 2: Secondary | ICD-10-CM | POA: Diagnosis not present

## 2022-05-02 DIAGNOSIS — I4891 Unspecified atrial fibrillation: Secondary | ICD-10-CM | POA: Diagnosis not present

## 2022-05-02 DIAGNOSIS — Z9181 History of falling: Secondary | ICD-10-CM | POA: Diagnosis not present

## 2022-05-02 DIAGNOSIS — R634 Abnormal weight loss: Secondary | ICD-10-CM | POA: Diagnosis not present

## 2022-05-02 DIAGNOSIS — N189 Chronic kidney disease, unspecified: Secondary | ICD-10-CM | POA: Diagnosis not present

## 2022-05-02 DIAGNOSIS — Z6825 Body mass index (BMI) 25.0-25.9, adult: Secondary | ICD-10-CM | POA: Diagnosis not present

## 2022-05-02 DIAGNOSIS — I10 Essential (primary) hypertension: Secondary | ICD-10-CM | POA: Diagnosis not present

## 2022-05-10 DIAGNOSIS — I10 Essential (primary) hypertension: Secondary | ICD-10-CM | POA: Diagnosis not present

## 2022-05-10 DIAGNOSIS — E785 Hyperlipidemia, unspecified: Secondary | ICD-10-CM | POA: Diagnosis not present

## 2022-05-10 DIAGNOSIS — L219 Seborrheic dermatitis, unspecified: Secondary | ICD-10-CM | POA: Diagnosis not present

## 2022-05-12 DIAGNOSIS — M2011 Hallux valgus (acquired), right foot: Secondary | ICD-10-CM | POA: Diagnosis not present

## 2022-05-12 DIAGNOSIS — B351 Tinea unguium: Secondary | ICD-10-CM | POA: Diagnosis not present

## 2022-05-16 ENCOUNTER — Emergency Department (HOSPITAL_COMMUNITY)
Admission: EM | Admit: 2022-05-16 | Discharge: 2022-05-17 | Disposition: A | Discharge status: Skilled Nursing Facility | Payer: Medicare Other | Attending: Emergency Medicine | Admitting: Emergency Medicine

## 2022-05-16 DIAGNOSIS — Y92129 Unspecified place in nursing home as the place of occurrence of the external cause: Secondary | ICD-10-CM | POA: Insufficient documentation

## 2022-05-16 DIAGNOSIS — M79604 Pain in right leg: Secondary | ICD-10-CM | POA: Insufficient documentation

## 2022-05-16 DIAGNOSIS — Z79899 Other long term (current) drug therapy: Secondary | ICD-10-CM | POA: Insufficient documentation

## 2022-05-16 DIAGNOSIS — S0081XA Abrasion of other part of head, initial encounter: Secondary | ICD-10-CM | POA: Insufficient documentation

## 2022-05-16 DIAGNOSIS — Z7982 Long term (current) use of aspirin: Secondary | ICD-10-CM | POA: Insufficient documentation

## 2022-05-16 DIAGNOSIS — M25561 Pain in right knee: Secondary | ICD-10-CM | POA: Diagnosis not present

## 2022-05-16 DIAGNOSIS — W19XXXA Unspecified fall, initial encounter: Secondary | ICD-10-CM | POA: Diagnosis not present

## 2022-05-16 DIAGNOSIS — R102 Pelvic and perineal pain: Secondary | ICD-10-CM | POA: Diagnosis not present

## 2022-05-16 DIAGNOSIS — J449 Chronic obstructive pulmonary disease, unspecified: Secondary | ICD-10-CM | POA: Diagnosis not present

## 2022-05-16 DIAGNOSIS — R8289 Other abnormal findings on cytological and histological examination of urine: Secondary | ICD-10-CM | POA: Diagnosis not present

## 2022-05-16 DIAGNOSIS — N3000 Acute cystitis without hematuria: Secondary | ICD-10-CM | POA: Diagnosis not present

## 2022-05-16 DIAGNOSIS — S0993XA Unspecified injury of face, initial encounter: Secondary | ICD-10-CM | POA: Diagnosis present

## 2022-05-16 DIAGNOSIS — F0390 Unspecified dementia without behavioral disturbance: Secondary | ICD-10-CM | POA: Diagnosis not present

## 2022-05-16 DIAGNOSIS — R410 Disorientation, unspecified: Secondary | ICD-10-CM | POA: Diagnosis not present

## 2022-05-16 DIAGNOSIS — W1839XA Other fall on same level, initial encounter: Secondary | ICD-10-CM | POA: Insufficient documentation

## 2022-05-16 DIAGNOSIS — R519 Headache, unspecified: Secondary | ICD-10-CM | POA: Diagnosis not present

## 2022-05-16 DIAGNOSIS — I10 Essential (primary) hypertension: Secondary | ICD-10-CM | POA: Diagnosis not present

## 2022-05-16 DIAGNOSIS — D72829 Elevated white blood cell count, unspecified: Secondary | ICD-10-CM | POA: Diagnosis not present

## 2022-05-16 DIAGNOSIS — M542 Cervicalgia: Secondary | ICD-10-CM | POA: Diagnosis not present

## 2022-05-16 DIAGNOSIS — R079 Chest pain, unspecified: Secondary | ICD-10-CM | POA: Diagnosis not present

## 2022-05-16 NOTE — ED Triage Notes (Signed)
Pt via [facility] from [facility], c/o unwitnessed fall approx 1715, reportedly found by staff on his back w knees bent. When staff attempted to assist to bed, pt expressed great deal of pain in R knee/ leg. EMS reports pt usually ambulatory  R sided facial hematoma  Abrasion to R knee - thinners Reported hx dementia, anxiety, HTN  110/70 HR 78 CBG 138

## 2022-05-16 NOTE — ED Provider Notes (Signed)
  [facility] EMERGENCY DEPARTMENT Provider Note   CSN: [id number] Arrival date & time: 05/16/22  2340     History {Add pertinent medical, surgical, social history, OB history to HPI:1} Chief Complaint  Patient presents with   Adam Rowland is a 76 y.o. male.  HPI     Home Medications Prior to Admission medications   Medication Sig Start Date End Date Taking? Authorizing Provider  aspirin EC 81 MG EC tablet Take 1 tablet (81 mg total) by mouth daily. 06/21/14   Theora Gianotti, NP  atorvastatin (LIPITOR) 40 MG tablet TAKE 1 TABLET ONCE DAILY AT BEDTIME Patient taking differently: Take 40 mg by mouth at bedtime. 08/02/21   Hassell Done, [name], FNP  donepezil (ARICEPT) 10 MG tablet Take 1 tablet (10 mg total) by mouth at bedtime. 08/02/21   Hassell Done, [name], FNP  lisinopril (ZESTRIL) 20 MG tablet Take 1 tablet (20 mg total) by mouth daily. 08/02/21   Hassell Done [name], FNP  metoprolol tartrate (LOPRESSOR) 25 MG tablet Take 1 tablet (25 mg total) by mouth 2 (two) times daily. 08/02/21   Hassell Done, [name], FNP  nitroGLYCERIN (NITROSTAT) 0.4 MG SL tablet Place 1 tablet (0.4 mg total) under the tongue every 5 (five) minutes x 3 doses as needed for chest pain. 12/01/16   Hassell Done, [name], FNP  polyethylene glycol powder (MIRALAX) 17 GM/SCOOP powder Take 17 g by mouth 2 (two) times daily as needed for moderate constipation. 02/04/21   Mercy Riding, MD  senna-docusate (SENOKOT-S) 8.6-50 MG tablet Take 1 tablet by mouth 2 (two) times daily between meals as needed for mild constipation. 02/04/21   Mercy Riding, MD      Allergies    Patient has no known allergies.    Review of Systems   Review of Systems  Physical Exam Updated Vital Signs Ht 5\' 9"  (1.753 m)   Wt 72.6 kg   BMI 23.63 kg/m  Physical Exam  ED Results / Procedures / Treatments   Labs (all labs ordered are listed, but only abnormal results are displayed) Labs Reviewed - No  data to display  EKG None  Radiology No results found.  Procedures Procedures  {Document cardiac monitor, telemetry assessment procedure when appropriate:1}  Medications Ordered in ED Medications - No data to display  ED Course/ Medical Decision Making/ A&P                           Medical Decision Making  ***  {Document critical care time when appropriate:1} {Document review of labs and clinical decision tools ie heart score, Chads2Vasc2 etc:1}  {Document your independent review of radiology images, and any outside records:1} {Document your discussion with family members, caretakers, and with consultants:1} {Document social determinants of health affecting pt's care:1} {Document your decision making why or why not admission, treatments were needed:1} Final Clinical Impression(s) / ED Diagnoses Final diagnoses:  None    Rx / DC Orders ED Discharge Orders     None

## 2022-05-17 ENCOUNTER — Emergency Department (HOSPITAL_COMMUNITY): Payer: Medicare Other

## 2022-05-17 DIAGNOSIS — R079 Chest pain, unspecified: Secondary | ICD-10-CM | POA: Diagnosis not present

## 2022-05-17 DIAGNOSIS — M25561 Pain in right knee: Secondary | ICD-10-CM | POA: Diagnosis not present

## 2022-05-17 DIAGNOSIS — Z743 Need for continuous supervision: Secondary | ICD-10-CM | POA: Diagnosis not present

## 2022-05-17 DIAGNOSIS — R410 Disorientation, unspecified: Secondary | ICD-10-CM | POA: Diagnosis not present

## 2022-05-17 DIAGNOSIS — R519 Headache, unspecified: Secondary | ICD-10-CM | POA: Diagnosis not present

## 2022-05-17 DIAGNOSIS — R102 Pelvic and perineal pain: Secondary | ICD-10-CM | POA: Diagnosis not present

## 2022-05-17 DIAGNOSIS — M542 Cervicalgia: Secondary | ICD-10-CM | POA: Diagnosis not present

## 2022-05-17 LAB — CBC WITH DIFFERENTIAL/PLATELET
Abs Immature Granulocytes: 0.11 10*3/uL — ABNORMAL HIGH (ref 0.00–0.07)
Basophils Absolute: 0 10*3/uL (ref 0.0–0.1)
Basophils Relative: 0 %
Eosinophils Absolute: 0 10*3/uL (ref 0.0–0.5)
Eosinophils Relative: 0 %
HCT: 42.8 % (ref 39.0–52.0)
Hemoglobin: 14.4 g/dL (ref 13.0–17.0)
Immature Granulocytes: 1 %
Lymphocytes Relative: 5 %
Lymphs Abs: 0.8 10*3/uL (ref 0.7–4.0)
MCH: 33 pg (ref 26.0–34.0)
MCHC: 33.6 g/dL (ref 30.0–36.0)
MCV: 98.2 fL (ref 80.0–100.0)
Monocytes Absolute: 1 10*3/uL (ref 0.1–1.0)
Monocytes Relative: 6 %
Neutro Abs: 15.2 10*3/uL — ABNORMAL HIGH (ref 1.7–7.7)
Neutrophils Relative %: 88 %
Platelets: 218 10*3/uL (ref 150–400)
RBC: 4.36 MIL/uL (ref 4.22–5.81)
RDW: 12.7 % (ref 11.5–15.5)
WBC: 17.1 10*3/uL — ABNORMAL HIGH (ref 4.0–10.5)
nRBC: 0 % (ref 0.0–0.2)

## 2022-05-17 LAB — BASIC METABOLIC PANEL
Anion gap: 9 (ref 5–15)
BUN: 23 mg/dL (ref 8–23)
CO2: 22 mmol/L (ref 22–32)
Calcium: 9 mg/dL (ref 8.9–10.3)
Chloride: 107 mmol/L (ref 98–111)
Creatinine, Ser: 1.09 mg/dL (ref 0.61–1.24)
GFR, Estimated: >60 mL/min (ref >60)
Glucose, Bld: 148 mg/dL — ABNORMAL HIGH (ref 70–99)
Potassium: 4.4 mmol/L (ref 3.5–5.1)
Sodium: 138 mmol/L (ref 135–145)

## 2022-05-17 LAB — URINALYSIS, ROUTINE W REFLEX MICROSCOPIC
Bilirubin Urine: NEGATIVE
Glucose, UA: NEGATIVE mg/dL
Ketones, ur: NEGATIVE mg/dL
Nitrite: NEGATIVE
Protein, ur: 30 mg/dL — AB
Specific Gravity, Urine: 1.02 (ref 1.005–1.030)
WBC, UA: >50 WBC/hpf — ABNORMAL HIGH (ref 0–5)
pH: 5 (ref 5.0–8.0)

## 2022-05-17 LAB — CK: Total CK: 57 U/L (ref 49–397)

## 2022-05-17 MED ORDER — FOSFOMYCIN TROMETHAMINE 3 G PO PACK
3.0000 g | PACK | Freq: Once | ORAL | Status: DC
  Filled 2022-05-17: qty 3

## 2022-05-17 MED ORDER — SODIUM CHLORIDE 0.9 % IV SOLN: 1.0000 g | Freq: Once | INTRAVENOUS | Status: DC

## 2022-05-17 MED ORDER — CEPHALEXIN 500 MG PO CAPS: 500.0000 mg | ORAL_CAPSULE | Freq: Four times a day (QID) | ORAL | 0 refills | Status: DC

## 2022-05-17 MED ORDER — CEPHALEXIN 250 MG PO CAPS
500.0000 mg | ORAL_CAPSULE | Freq: Once | ORAL | Status: AC
  Administered 2022-05-17: 500 mg via ORAL
  Filled 2022-05-17: qty 2

## 2022-05-17 NOTE — Discharge Instructions (Addendum)
Adam Rowland was seen in the ER today after his fall.  His physical exam was reassuring, there is no evidence of traumatic injury after his fall.  He was found to have a urinary tract infection for which she was administered and minutes in the emergency department.  Please recheck his urine in approximate 1 week to confirm clearance of his infection.  Return to the ER with any new severe symptoms.

## 2022-05-20 ENCOUNTER — Inpatient Hospital Stay (HOSPITAL_COMMUNITY)
Admission: EM | Admit: 2022-05-20 | Discharge: 2022-05-27 | DRG: 522 | Disposition: A | Discharge status: Skilled Nursing Facility | Payer: Medicare Other | Source: Skilled Nursing Facility | Attending: Internal Medicine | Admitting: Internal Medicine

## 2022-05-20 ENCOUNTER — Emergency Department (HOSPITAL_COMMUNITY): Payer: Medicare Other

## 2022-05-20 DIAGNOSIS — F03C0 Unspecified dementia, severe, without behavioral disturbance, psychotic disturbance, mood disturbance, and anxiety: Secondary | ICD-10-CM | POA: Diagnosis present

## 2022-05-20 DIAGNOSIS — F0390 Unspecified dementia without behavioral disturbance: Secondary | ICD-10-CM | POA: Diagnosis present

## 2022-05-20 DIAGNOSIS — R531 Weakness: Secondary | ICD-10-CM | POA: Diagnosis not present

## 2022-05-20 DIAGNOSIS — I10 Essential (primary) hypertension: Secondary | ICD-10-CM | POA: Diagnosis not present

## 2022-05-20 DIAGNOSIS — M25551 Pain in right hip: Secondary | ICD-10-CM | POA: Diagnosis not present

## 2022-05-20 DIAGNOSIS — I255 Ischemic cardiomyopathy: Secondary | ICD-10-CM | POA: Diagnosis present

## 2022-05-20 DIAGNOSIS — I959 Hypotension, unspecified: Secondary | ICD-10-CM

## 2022-05-20 DIAGNOSIS — I9589 Other hypotension: Secondary | ICD-10-CM | POA: Diagnosis present

## 2022-05-20 DIAGNOSIS — Z87891 Personal history of nicotine dependence: Secondary | ICD-10-CM

## 2022-05-20 DIAGNOSIS — M818 Other osteoporosis without current pathological fracture: Secondary | ICD-10-CM | POA: Diagnosis not present

## 2022-05-20 DIAGNOSIS — Z811 Family history of alcohol abuse and dependence: Secondary | ICD-10-CM

## 2022-05-20 DIAGNOSIS — Z8052 Family history of malignant neoplasm of bladder: Secondary | ICD-10-CM

## 2022-05-20 DIAGNOSIS — S80211A Abrasion, right knee, initial encounter: Secondary | ICD-10-CM | POA: Diagnosis present

## 2022-05-20 DIAGNOSIS — W19XXXA Unspecified fall, initial encounter: Secondary | ICD-10-CM

## 2022-05-20 DIAGNOSIS — Z79899 Other long term (current) drug therapy: Secondary | ICD-10-CM

## 2022-05-20 DIAGNOSIS — N390 Urinary tract infection, site not specified: Secondary | ICD-10-CM | POA: Diagnosis present

## 2022-05-20 DIAGNOSIS — Z7982 Long term (current) use of aspirin: Secondary | ICD-10-CM

## 2022-05-20 DIAGNOSIS — S72001A Fracture of unspecified part of neck of right femur, initial encounter for closed fracture: Secondary | ICD-10-CM | POA: Diagnosis not present

## 2022-05-20 DIAGNOSIS — M25552 Pain in left hip: Secondary | ICD-10-CM | POA: Diagnosis not present

## 2022-05-20 DIAGNOSIS — Z043 Encounter for examination and observation following other accident: Secondary | ICD-10-CM | POA: Diagnosis not present

## 2022-05-20 DIAGNOSIS — W1830XA Fall on same level, unspecified, initial encounter: Secondary | ICD-10-CM | POA: Diagnosis present

## 2022-05-20 DIAGNOSIS — I252 Old myocardial infarction: Secondary | ICD-10-CM

## 2022-05-20 DIAGNOSIS — E785 Hyperlipidemia, unspecified: Secondary | ICD-10-CM | POA: Diagnosis present

## 2022-05-20 DIAGNOSIS — Z955 Presence of coronary angioplasty implant and graft: Secondary | ICD-10-CM

## 2022-05-20 DIAGNOSIS — S72011A Unspecified intracapsular fracture of right femur, initial encounter for closed fracture: Secondary | ICD-10-CM | POA: Diagnosis not present

## 2022-05-20 DIAGNOSIS — I2510 Atherosclerotic heart disease of native coronary artery without angina pectoris: Secondary | ICD-10-CM | POA: Diagnosis present

## 2022-05-20 DIAGNOSIS — M79604 Pain in right leg: Secondary | ICD-10-CM | POA: Diagnosis not present

## 2022-05-20 DIAGNOSIS — I480 Paroxysmal atrial fibrillation: Secondary | ICD-10-CM | POA: Diagnosis present

## 2022-05-20 DIAGNOSIS — S72009A Fracture of unspecified part of neck of unspecified femur, initial encounter for closed fracture: Secondary | ICD-10-CM | POA: Diagnosis present

## 2022-05-20 DIAGNOSIS — Z8249 Family history of ischemic heart disease and other diseases of the circulatory system: Secondary | ICD-10-CM

## 2022-05-20 DIAGNOSIS — Y92128 Other place in nursing home as the place of occurrence of the external cause: Secondary | ICD-10-CM

## 2022-05-20 DIAGNOSIS — Z66 Do not resuscitate: Secondary | ICD-10-CM | POA: Diagnosis present

## 2022-05-20 NOTE — ED Triage Notes (Signed)
BIB [facility] from [facility]. Per facility pt is having Rt upper leg pain. Facility believes he might have a femur fx from a fall that happened 4 days ago. Pt was seen and evaluated here for the fall. Pt is normally ambulatory however at this time pt is not bearing weight to rt leg.

## 2022-05-20 NOTE — ED Provider Notes (Addendum)
[facility] EMERGENCY DEPARTMENT Provider Note   CSN: [id number] Arrival date & time: 05/20/22  2118     History  Chief Complaint  Patient presents with   Leg Pain    Adam Rowland is a 76 y.o. male.  Patient brought in by EMS from [place].  Patient facility is concerned about right upper leg pain.  I think he may have a femur fracture fall 4 days ago patient was seen at this facility on September 18 did have AP pelvis CT head and neck but did not have x-rays of the extremity.  Patient has a history of dementia.  Patient does not seem to have any leg pain in the stretcher.  Past medical history significant for hyperlipidemia hypertension cardiomyopathy ischemic coronary artery disease with stents.  History of dementia acute metabolic encephalopathy past surgical history as mentioned last heart catheterization 2015 with stents.  Former smoker quit in 1989.       Home Medications Prior to Admission medications   Medication Sig Start Date End Date Taking? Authorizing Provider  aspirin EC 81 MG EC tablet Take 1 tablet (81 mg total) by mouth daily. 06/21/14   Theora Gianotti, NP  atorvastatin (LIPITOR) 40 MG tablet TAKE 1 TABLET ONCE DAILY AT BEDTIME Patient taking differently: Take 40 mg by mouth at bedtime. 08/02/21   Hassell Done, [name], FNP  cephALEXin (KEFLEX) 500 MG capsule Take 1 capsule (500 mg total) by mouth 4 (four) times daily for 7 days. 05/17/22 05/24/22  Kneller, Norman Herrlich, DO  donepezil (ARICEPT) 10 MG tablet Take 1 tablet (10 mg total) by mouth at bedtime. 08/02/21   Hassell Done, [name], FNP  lisinopril (ZESTRIL) 20 MG tablet Take 1 tablet (20 mg total) by mouth daily. 08/02/21   Hassell Done [name], FNP  metoprolol tartrate (LOPRESSOR) 25 MG tablet Take 1 tablet (25 mg total) by mouth 2 (two) times daily. 08/02/21   Hassell Done, [name], FNP  nitroGLYCERIN (NITROSTAT) 0.4 MG SL tablet Place 1 tablet (0.4 mg total) under the tongue every 5  (five) minutes x 3 doses as needed for chest pain. 12/01/16   Hassell Done, [name], FNP  polyethylene glycol powder (MIRALAX) 17 GM/SCOOP powder Take 17 g by mouth 2 (two) times daily as needed for moderate constipation. 02/04/21   Mercy Riding, MD  senna-docusate (SENOKOT-S) 8.6-50 MG tablet Take 1 tablet by mouth 2 (two) times daily between meals as needed for mild constipation. 02/04/21   Mercy Riding, MD      Allergies    Patient has no known allergies.    Review of Systems   Review of Systems  Unable to perform ROS: Dementia    Physical Exam Updated Vital Signs BP 96/62 (BP Location: Right Arm)   Pulse 81   Temp 97.9 F (36.6 C) (Oral)   Resp 14   SpO2 97%  Physical Exam Vitals and nursing note reviewed.  Constitutional:      General: He is not in acute distress.    Appearance: He is well-developed.  HENT:     Head: Normocephalic and atraumatic.  Eyes:     Extraocular Movements: Extraocular movements intact.     Conjunctiva/sclera: Conjunctivae normal.     Pupils: Pupils are equal, round, and reactive to light.  Cardiovascular:     Rate and Rhythm: Normal rate and regular rhythm.     Heart sounds: No murmur heard. Pulmonary:     Effort: Pulmonary effort is normal. No respiratory distress.  Breath sounds: Normal breath sounds.  Abdominal:     Palpations: Abdomen is soft.     Tenderness: There is no abdominal tenderness.  Musculoskeletal:        General: Signs of injury present. No swelling, tenderness or deformity.     Cervical back: Neck supple.     Comments: Passive range of motion of the right lower extremity to hip at the knee ankle and foot without any eliciting of any pain no obvious deformity.  Cap refill to both feet bilaterally intact patient does have some abrasions to the right knee but no evidence of pleural effusion no patellar dislocation.  Left lower extremity without any abnormalities.  There is some mild edema to the right lower extremity kind of  distal leg and ankle area.  Skin:    General: Skin is warm and dry.     Capillary Refill: Capillary refill takes less than 2 seconds.  Neurological:     General: No focal deficit present.     Mental Status: He is alert. Mental status is at baseline.  Psychiatric:        Mood and Affect: Mood normal.     ED Results / Procedures / Treatments   Labs (all labs ordered are listed, but only abnormal results are displayed) Labs Reviewed - No data to display  EKG None  Radiology No results found.  Procedures Procedures    Medications Ordered in ED Medications - No data to display  ED Course/ Medical Decision Making/ A&P                           Medical Decision Making Amount and/or Complexity of Data Reviewed Labs: ordered. Radiology: ordered.  Risk Decision regarding hospitalization.   Clinically no evidence of any acute fracture to the right lower extremity but because of the facility is concerned we will x-ray the femur and the tibia and the foot.  If there is any concerns based on those x-rays will isolate them down either to the hip or knee or ankle area.  Some consideration could be given to doing CT of the right hip but based on my clinical exam does not seem to be any concerns there at all.  Tib-fib x-ray without any acute findings.  X-ray of the femur shows an acute impacted mildly angulated subcapital femoral neck fracture.  We will contact unassigned Ortho and contact unassigned medicine for admission.  Have ordered labs.  This was an unexpected finding.  Patient does not need admission and does need to have the hip fixed.    Final Clinical Impression(s) / ED Diagnoses Final diagnoses:  Right leg pain  Fall, initial encounter    Rx / DC Orders ED Discharge Orders     None         Vanetta Mulders, MD 05/20/22 2248    Vanetta Mulders, MD 05/20/22 [phone]

## 2022-05-21 ENCOUNTER — Inpatient Hospital Stay (HOSPITAL_COMMUNITY): Payer: Medicare Other

## 2022-05-21 DIAGNOSIS — I110 Hypertensive heart disease with heart failure: Secondary | ICD-10-CM | POA: Diagnosis not present

## 2022-05-21 DIAGNOSIS — I10 Essential (primary) hypertension: Secondary | ICD-10-CM

## 2022-05-21 DIAGNOSIS — S72041A Displaced fracture of base of neck of right femur, initial encounter for closed fracture: Secondary | ICD-10-CM | POA: Diagnosis not present

## 2022-05-21 DIAGNOSIS — I480 Paroxysmal atrial fibrillation: Secondary | ICD-10-CM

## 2022-05-21 DIAGNOSIS — R1312 Dysphagia, oropharyngeal phase: Secondary | ICD-10-CM | POA: Diagnosis not present

## 2022-05-21 DIAGNOSIS — Z8052 Family history of malignant neoplasm of bladder: Secondary | ICD-10-CM | POA: Diagnosis not present

## 2022-05-21 DIAGNOSIS — F0390 Unspecified dementia without behavioral disturbance: Secondary | ICD-10-CM | POA: Diagnosis not present

## 2022-05-21 DIAGNOSIS — Z87891 Personal history of nicotine dependence: Secondary | ICD-10-CM | POA: Diagnosis not present

## 2022-05-21 DIAGNOSIS — Z4789 Encounter for other orthopedic aftercare: Secondary | ICD-10-CM | POA: Diagnosis not present

## 2022-05-21 DIAGNOSIS — R2689 Other abnormalities of gait and mobility: Secondary | ICD-10-CM | POA: Diagnosis not present

## 2022-05-21 DIAGNOSIS — Z8249 Family history of ischemic heart disease and other diseases of the circulatory system: Secondary | ICD-10-CM | POA: Diagnosis not present

## 2022-05-21 DIAGNOSIS — Z7982 Long term (current) use of aspirin: Secondary | ICD-10-CM | POA: Diagnosis not present

## 2022-05-21 DIAGNOSIS — Z96641 Presence of right artificial hip joint: Secondary | ICD-10-CM | POA: Diagnosis not present

## 2022-05-21 DIAGNOSIS — I2510 Atherosclerotic heart disease of native coronary artery without angina pectoris: Secondary | ICD-10-CM | POA: Diagnosis not present

## 2022-05-21 DIAGNOSIS — M25551 Pain in right hip: Secondary | ICD-10-CM | POA: Diagnosis present

## 2022-05-21 DIAGNOSIS — Z811 Family history of alcohol abuse and dependence: Secondary | ICD-10-CM | POA: Diagnosis not present

## 2022-05-21 DIAGNOSIS — N3000 Acute cystitis without hematuria: Secondary | ICD-10-CM

## 2022-05-21 DIAGNOSIS — S72001A Fracture of unspecified part of neck of right femur, initial encounter for closed fracture: Secondary | ICD-10-CM

## 2022-05-21 DIAGNOSIS — Z471 Aftercare following joint replacement surgery: Secondary | ICD-10-CM | POA: Diagnosis not present

## 2022-05-21 DIAGNOSIS — S72009A Fracture of unspecified part of neck of unspecified femur, initial encounter for closed fracture: Secondary | ICD-10-CM | POA: Diagnosis present

## 2022-05-21 DIAGNOSIS — S80211A Abrasion, right knee, initial encounter: Secondary | ICD-10-CM | POA: Diagnosis present

## 2022-05-21 DIAGNOSIS — E785 Hyperlipidemia, unspecified: Secondary | ICD-10-CM | POA: Diagnosis not present

## 2022-05-21 DIAGNOSIS — W19XXXA Unspecified fall, initial encounter: Secondary | ICD-10-CM | POA: Diagnosis not present

## 2022-05-21 DIAGNOSIS — F03C0 Unspecified dementia, severe, without behavioral disturbance, psychotic disturbance, mood disturbance, and anxiety: Secondary | ICD-10-CM | POA: Diagnosis present

## 2022-05-21 DIAGNOSIS — N390 Urinary tract infection, site not specified: Secondary | ICD-10-CM | POA: Diagnosis present

## 2022-05-21 DIAGNOSIS — M6281 Muscle weakness (generalized): Secondary | ICD-10-CM | POA: Diagnosis not present

## 2022-05-21 DIAGNOSIS — Y92128 Other place in nursing home as the place of occurrence of the external cause: Secondary | ICD-10-CM | POA: Diagnosis not present

## 2022-05-21 DIAGNOSIS — I959 Hypotension, unspecified: Secondary | ICD-10-CM

## 2022-05-21 DIAGNOSIS — S72001D Fracture of unspecified part of neck of right femur, subsequent encounter for closed fracture with routine healing: Secondary | ICD-10-CM | POA: Diagnosis not present

## 2022-05-21 DIAGNOSIS — Z79899 Other long term (current) drug therapy: Secondary | ICD-10-CM | POA: Diagnosis not present

## 2022-05-21 DIAGNOSIS — S72009S Fracture of unspecified part of neck of unspecified femur, sequela: Secondary | ICD-10-CM | POA: Diagnosis not present

## 2022-05-21 DIAGNOSIS — W1830XA Fall on same level, unspecified, initial encounter: Secondary | ICD-10-CM | POA: Diagnosis present

## 2022-05-21 DIAGNOSIS — Z955 Presence of coronary angioplasty implant and graft: Secondary | ICD-10-CM | POA: Diagnosis not present

## 2022-05-21 DIAGNOSIS — I9589 Other hypotension: Secondary | ICD-10-CM | POA: Diagnosis present

## 2022-05-21 DIAGNOSIS — Z66 Do not resuscitate: Secondary | ICD-10-CM | POA: Diagnosis present

## 2022-05-21 DIAGNOSIS — I25118 Atherosclerotic heart disease of native coronary artery with other forms of angina pectoris: Secondary | ICD-10-CM

## 2022-05-21 DIAGNOSIS — I255 Ischemic cardiomyopathy: Secondary | ICD-10-CM | POA: Diagnosis present

## 2022-05-21 DIAGNOSIS — M7989 Other specified soft tissue disorders: Secondary | ICD-10-CM | POA: Diagnosis not present

## 2022-05-21 DIAGNOSIS — Z7401 Bed confinement status: Secondary | ICD-10-CM | POA: Diagnosis not present

## 2022-05-21 DIAGNOSIS — S72011A Unspecified intracapsular fracture of right femur, initial encounter for closed fracture: Secondary | ICD-10-CM | POA: Diagnosis present

## 2022-05-21 DIAGNOSIS — R600 Localized edema: Secondary | ICD-10-CM | POA: Diagnosis not present

## 2022-05-21 DIAGNOSIS — E782 Mixed hyperlipidemia: Secondary | ICD-10-CM

## 2022-05-21 DIAGNOSIS — I252 Old myocardial infarction: Secondary | ICD-10-CM | POA: Diagnosis not present

## 2022-05-21 LAB — URINALYSIS, ROUTINE W REFLEX MICROSCOPIC
Bilirubin Urine: NEGATIVE
Glucose, UA: NEGATIVE mg/dL
Ketones, ur: NEGATIVE mg/dL
Nitrite: NEGATIVE
Protein, ur: 30 mg/dL — AB
Specific Gravity, Urine: >1.03 — ABNORMAL HIGH (ref 1.005–1.030)
pH: 6 (ref 5.0–8.0)

## 2022-05-21 LAB — CBC WITH DIFFERENTIAL/PLATELET
Abs Immature Granulocytes: 0.05 10*3/uL (ref 0.00–0.07)
Basophils Absolute: 0.1 10*3/uL (ref 0.0–0.1)
Basophils Relative: 1 %
Eosinophils Absolute: 0.5 10*3/uL (ref 0.0–0.5)
Eosinophils Relative: 4 %
HCT: 43.4 % (ref 39.0–52.0)
Hemoglobin: 14.1 g/dL (ref 13.0–17.0)
Immature Granulocytes: 1 %
Lymphocytes Relative: 18 %
Lymphs Abs: 1.8 10*3/uL (ref 0.7–4.0)
MCH: 33.2 pg (ref 26.0–34.0)
MCHC: 32.5 g/dL (ref 30.0–36.0)
MCV: 102.1 fL — ABNORMAL HIGH (ref 80.0–100.0)
Monocytes Absolute: 0.9 10*3/uL (ref 0.1–1.0)
Monocytes Relative: 8 %
Neutro Abs: 7.1 10*3/uL (ref 1.7–7.7)
Neutrophils Relative %: 68 %
Platelets: 219 10*3/uL (ref 150–400)
RBC: 4.25 MIL/uL (ref 4.22–5.81)
RDW: 12.8 % (ref 11.5–15.5)
WBC: 10.4 10*3/uL (ref 4.0–10.5)
nRBC: 0 % (ref 0.0–0.2)

## 2022-05-21 LAB — COMPREHENSIVE METABOLIC PANEL
ALT: 37 U/L (ref 0–44)
AST: 51 U/L — ABNORMAL HIGH (ref 15–41)
Albumin: 3 g/dL — ABNORMAL LOW (ref 3.5–5.0)
Alkaline Phosphatase: 53 U/L (ref 38–126)
Anion gap: 8 (ref 5–15)
BUN: 28 mg/dL — ABNORMAL HIGH (ref 8–23)
CO2: 22 mmol/L (ref 22–32)
Calcium: 8.6 mg/dL — ABNORMAL LOW (ref 8.9–10.3)
Chloride: 115 mmol/L — ABNORMAL HIGH (ref 98–111)
Creatinine, Ser: 0.89 mg/dL (ref 0.61–1.24)
GFR, Estimated: >60 mL/min (ref >60)
Glucose, Bld: 100 mg/dL — ABNORMAL HIGH (ref 70–99)
Potassium: 4.1 mmol/L (ref 3.5–5.1)
Sodium: 145 mmol/L (ref 135–145)
Total Bilirubin: 0.7 mg/dL (ref 0.3–1.2)
Total Protein: 5.8 g/dL — ABNORMAL LOW (ref 6.5–8.1)

## 2022-05-21 LAB — URINALYSIS, MICROSCOPIC (REFLEX): Bacteria, UA: NONE SEEN

## 2022-05-21 MED ORDER — CHLORHEXIDINE GLUCONATE 4 % EX LIQD
60.0000 mL | Freq: Once | CUTANEOUS | Status: AC
  Administered 2022-05-22: 4 via TOPICAL
  Filled 2022-05-21: qty 60

## 2022-05-21 MED ORDER — MORPHINE SULFATE (PF) 2 MG/ML IV SOLN
0.5000 mg | INTRAVENOUS | Status: DC | PRN
  Filled 2022-05-21: qty 1

## 2022-05-21 MED ORDER — POVIDONE-IODINE 10 % EX SWAB: 2.0000 | Freq: Once | CUTANEOUS | Status: DC

## 2022-05-21 MED ORDER — CEFAZOLIN SODIUM-DEXTROSE 2-4 GM/100ML-% IV SOLN
2.0000 g | INTRAVENOUS | Status: AC
  Administered 2022-05-22: 2 g via INTRAVENOUS
  Filled 2022-05-21: qty 100

## 2022-05-21 MED ORDER — SENNA 8.6 MG PO TABS
1.0000 | ORAL_TABLET | Freq: Two times a day (BID) | ORAL | Status: DC
  Administered 2022-05-21: 8.6 mg via ORAL
  Filled 2022-05-21: qty 1

## 2022-05-21 MED ORDER — CEPHALEXIN 250 MG PO CAPS: 500.0000 mg | ORAL_CAPSULE | Freq: Four times a day (QID) | ORAL | Status: DC

## 2022-05-21 MED ORDER — SODIUM CHLORIDE 0.9 % IV SOLN: INTRAVENOUS | Status: AC

## 2022-05-21 MED ORDER — ACETAMINOPHEN 325 MG PO TABS
650.0000 mg | ORAL_TABLET | Freq: Three times a day (TID) | ORAL | Status: DC
  Administered 2022-05-21 – 2022-05-22 (×2): 650 mg via ORAL
  Filled 2022-05-21 (×2): qty 2

## 2022-05-21 MED ORDER — TRANEXAMIC ACID-NACL 1000-0.7 MG/100ML-% IV SOLN
1000.0000 mg | INTRAVENOUS | Status: AC
  Administered 2022-05-22: 1000 mg via INTRAVENOUS
  Filled 2022-05-21: qty 100

## 2022-05-21 MED ORDER — SODIUM CHLORIDE 0.9 % IV BOLUS
1000.0000 mL | Freq: Once | INTRAVENOUS | Status: AC
  Administered 2022-05-21: 1000 mL via INTRAVENOUS

## 2022-05-21 MED ORDER — HYDROCODONE-ACETAMINOPHEN 5-325 MG PO TABS
1.0000 | ORAL_TABLET | Freq: Four times a day (QID) | ORAL | Status: DC | PRN
  Administered 2022-05-23: 1 via ORAL
  Filled 2022-05-21 (×4): qty 1

## 2022-05-21 NOTE — ED Notes (Addendum)
Reported hypotension to Dr. Flossie Buffy; verbal orders for bolus provided. EKG done and given to EDP for initial review.

## 2022-05-21 NOTE — Progress Notes (Addendum)
ADDENDUM:  As below. I was contacted early am hours for consultation. However, was subsequently called to see emergency this am and have been in OR since delaying patient eval.  CT SCAN demonstrates patient needs a hemiarthroplasty, rather than cannulated screws which would be a little more operative time and physiologic burden. Do not anticipate need for further work up and this has been confirmed by Dr. Avon Gully with Medicine Service. Preliminary plan is hemiarthroplasty with Dr. Lyla Glassing in am. Will order diet now.      [facility]opa[facility] Tra[facility]   Just made aware of patient  Films reviewed Check CT scan of R hip to see if fracture amenable to cannulated screw fixation.  Based of current films I do not think it is and will require hip hemiarthroplasty   Keep NPO for now but suspect surgery will be tomorrow  Full exam to follow   Jari Pigg, PA-C [phone] (C) 05/21/2022, 1:58 PM  [facility] [place] [address] [phone] (F)       Patient ID: Adam Rowland, male   DOB: 1945-09-27, 76 y.o.   MRN: [id number]

## 2022-05-21 NOTE — Assessment & Plan Note (Addendum)
Severe and is completely disoriented at baseline

## 2022-05-21 NOTE — Progress Notes (Signed)
PROGRESS NOTE    KEYVIN Rowland  UXL:[id number] DOB: 10/09/1945 DOA: 05/20/2022 PCP: Bennie Pierini, FNP   Brief Narrative:   Adam Rowland is a 76 y.o. male presenting after unwitnessed fall at SNF several days prior to admission with medical history significant of Dementia, atrial fibrillation not on anticoagulation, hypertension, hyperlipidemia and CAD who presents with difficulty weightbearing. Imaging at previous facility was reported negative. Treated for presumed UTI at outside facility.  Admitted to hospital service after imaging in our ED confirmed subcapital R femur neck Fx. Ortho consulted.   Assessment & Plan:   Principal Problem:   Hip fracture (HCC) Active Problems:   Hyperlipidemia   CAD (coronary artery disease)   Dementia (HCC)   AF (paroxysmal atrial fibrillation) (HCC)   UTI (urinary tract infection)   Hypotension   Hip fracture (HCC) Acute impacted mildly angulated subcapital right femoral neck fracture -Ortho to see in consultation - possible ORIF later today -Remains NPO perioperatively -PRN hydrocodone and IV morphine for pain   Hypotension, chronic -ongoing issue per signout from facilty -IVF in ED with minimal response -Unlikely related to subacute UTI -No leukocytosis and he is already completed antibiotics for UTI -Possibly exacerbated by narcotics - high risk for polypharmacy/orthostatics post operatively   UTI (urinary tract infection) POA, resolved prior to admit UTI dx on 9/18- completed abx therapy at this point UA WNL here - follow cultures for completion   AF (paroxysmal atrial fibrillation) (HCC) Rate controlled.  Not on anticoagulation.   Dementia (HCC) Severe and is completely disoriented at baseline   Hyperlipidemia Continue statin pending med rec.  DVT prophylaxis: Per Ortho Code Status: DNR Family Communication: None present  Status is: Inpt  Dispo: The patient is from: SNF              Anticipated d/c is to: TBD -  likely back to facility              Anticipated d/c date is: 48-72h pending surgical course              Patient currently NOT medically stable for discharge  Consultants:  Ortho, Dr Carola Frost  Procedures:  TBD  Antimicrobials:  Perioperatively per ortho   Subjective: No acute issues/events overnight  Objective: Vitals:   05/21/22 0215 05/21/22 0515 05/21/22 0553 05/21/22 0710  BP: (!) 108/59 105/65    Pulse: 87 95    Resp: 17 17    Temp:   98.2 F (36.8 C) 100 F (37.8 C)  TempSrc:   Oral Rectal  SpO2: 93% 97%     No intake or output data in the 24 hours ending 05/21/22 0818 There were no vitals filed for this visit.  Examination:  General:  Pleasantly resting in bed, No acute distress. HEENT:  Normocephalic atraumatic.  Sclerae nonicteric, noninjected.  Extraocular movements intact bilaterally. Neck:  Without mass or deformity.  Trachea is midline. Lungs:  Clear to auscultate bilaterally without rhonchi, wheeze, or rales. Heart:  Regular rate and rhythm.  Without murmurs, rubs, or gallops. Abdomen:  Soft, nontender, nondistended.  Without guarding or rebound. Extremities: Without cyanosis, clubbing, edema, or obvious deformity. Vascular:  Dorsalis pedis and posterior tibial pulses palpable bilaterally. Skin:  Warm and dry, no erythema, no ulcerations.  Data Reviewed: I have personally reviewed following labs and imaging studies  CBC: Recent Labs  Lab 05/17/22 0012 05/21/22 0050  WBC 17.1* 10.4  NEUTROABS 15.2* 7.1  HGB 14.4 14.1  HCT 42.8 43.4  MCV 98.2 102.1*  PLT 218 258   Basic Metabolic Panel: Recent Labs  Lab 05/17/22 0012 05/21/22 0050  NA 138 145  K 4.4 4.1  CL 107 115*  CO2 22 22  GLUCOSE 148* 100*  BUN 23 28*  CREATININE 1.09 0.89  CALCIUM 9.0 8.6*   GFR: Estimated Creatinine Clearance: 71.7 mL/min (by C-G formula based on SCr of 0.89 mg/dL). Liver Function Tests: Recent Labs  Lab 05/21/22 0050  AST 51*  ALT 37  ALKPHOS 53   BILITOT 0.7  PROT 5.8*  ALBUMIN 3.0*   No results for input(s): "LIPASE", "AMYLASE" in the last 168 hours. No results for input(s): "AMMONIA" in the last 168 hours. Coagulation Profile: No results for input(s): "INR", "PROTIME" in the last 168 hours. Cardiac Enzymes: Recent Labs  Lab 05/17/22 0012  CKTOTAL 57   BNP (last 3 results) No results for input(s): "PROBNP" in the last 8760 hours. HbA1C: No results for input(s): "HGBA1C" in the last 72 hours. CBG: No results for input(s): "GLUCAP" in the last 168 hours. Lipid Profile: No results for input(s): "CHOL", "HDL", "LDLCALC", "TRIG", "CHOLHDL", "LDLDIRECT" in the last 72 hours. Thyroid Function Tests: No results for input(s): "TSH", "T4TOTAL", "FREET4", "T3FREE", "THYROIDAB" in the last 72 hours. Anemia Panel: No results for input(s): "VITAMINB12", "FOLATE", "FERRITIN", "TIBC", "IRON", "RETICCTPCT" in the last 72 hours. Sepsis Labs: No results for input(s): "PROCALCITON", "LATICACIDVEN" in the last 168 hours.  No results found for this or any previous visit (from the past 240 hour(s)).       Radiology Studies: DG Foot Complete Right  Result Date: 05/21/2022 CLINICAL DATA:  Fall.  Patient will not bear weight. EXAM: RIGHT FOOT COMPLETE - 3+ VIEW COMPARISON:  None Available. FINDINGS: There is no evidence of fracture or dislocation. There is no evidence of arthropathy or other focal bone abnormality. Soft tissues are unremarkable. IMPRESSION: No fracture or traumatic malalignment. Vascular calcifications noted. Electronically Signed   By: Dorise Bullion III M.D.   On: 05/21/2022 07:53   DG Chest 2 View  Result Date: 05/21/2022 CLINICAL DATA:  fall 4 days ago; medical history significant for hyperlipidemia, hypertension, and cardiomyopathy EXAM: CHEST - 2 VIEW COMPARISON:  Radiographs 05/17/2022 FINDINGS: No focal consolidation, pleural effusion, or pneumothorax. Normal cardiomediastinal silhouette. No acute osseous  abnormality. Coronary stenting. Aortic atherosclerotic calcification. IMPRESSION: No active cardiopulmonary disease. Electronically Signed   By: Placido Sou M.D.   On: 05/21/2022 00:14   DG Femur Min 2 Views Right  Result Date: 05/20/2022 CLINICAL DATA:  Fall, right leg pain EXAM: RIGHT FEMUR 2 VIEWS COMPARISON:  None Available. FINDINGS: Two view radiograph right femur demonstrates an acute impacted, mildly medially angulated subcapital right femoral neck fracture. Femoral head is still seated within the right acetabulum. Superimposed mild to moderate right hip degenerative arthritis with asymmetric joint space narrowing. Advanced vascular calcifications are noted within the medial right thigh. The right femur distally appears intact. IMPRESSION: Acute impacted, mildly medially angulated subcapital right femoral neck fracture. Electronically Signed   By: Fidela Salisbury M.D.   On: 05/20/2022 23:03   DG Tibia/Fibula Right  Result Date: 05/20/2022 CLINICAL DATA:  Fall, right leg pain EXAM: RIGHT TIBIA AND FIBULA - 2 VIEW COMPARISON:  None Available. FINDINGS: There is no evidence of fracture or other focal bone lesions. Advanced vascular calcifications are noted within the right lower extremity and visualized right hindfoot. IMPRESSION: No acute fracture or dislocation. Electronically Signed   By: Linwood Dibbles.D.  On: 05/20/2022 23:01    Scheduled Meds:  cephALEXin  500 mg Oral QID   senna  1 tablet Oral BID   Continuous Infusions:  sodium chloride 100 mL/hr at 05/21/22 0233     LOS: 0 days   Time spent:  Azucena Fallen, DO [facility]  If 7PM-7AM, please contact night-coverage [email or web address]  05/21/2022, 8:18 AM

## 2022-05-21 NOTE — Assessment & Plan Note (Signed)
BP downward trended to 80s in ED. Will give additional NS bolus and keep on continuous IV fluids.  -from SNF documentation appears he has been having issues with low BP. Will hold all antihypertensives.  -No leukocytosis and he is already on antibiotics for UTI -will continue to monitor with SBP goal of >90 and MAP 65 -hold opioids for now until BP normalizes

## 2022-05-21 NOTE — H&P (Addendum)
History and Physical    Patient: Adam Rowland:[id number] DOB: Oct 09, 1945 DOA: 05/20/2022 DOS: the patient was seen and examined on 05/21/2022 PCP: Bennie Pierini, FNP  Patient coming from: Home  Chief Complaint:  Chief Complaint  Patient presents with   Leg Pain   HPI: EFFREY Rowland is a 76 y.o. male with medical history significant of Dementia, atrial fibrillation not on anticoagulation, hypertension, hyperlipidemia and CAD who presents with concerns of nonweightbearing following a fall several days ago.  He was evaluated in the ED on 05/16/2022 following unwitnessed fall at [facility].  He had knee, pelvis x-ray as well as CT head and cervical spine that was negative.  Had UA concerning for UTI and was discharged on Keflex.  However SNF was concerned since patient has been nonweightbearing since a fall and patient was sent back to the ED today.  Patient has severe dementia and unable to answer any questions and follows minimal commands.   In the ED, he was found to have an acute impacted, mildly angulated subcapital right femur neck fracture.  CBC and CMP pending at time of admission.  EDP discussed with orthopedic Dr. Carola Frost who will see patient in consultation tomorrow. Review of Systems: unable to review all systems due to the inability of the patient to answer questions. Past Medical History:  Diagnosis Date   Acute metabolic encephalopathy    CAD (coronary artery disease)    a. 05/2014 NSTEMI/PCI: LM nl, LAD 97m (3.0x23 Xience Alpine DES), D1 90, LCX 7m, OM1 80, RCA 30p/d, RPDA 40-50p, EF 40-45% ->Plan for staged PCI of LCX/OM1.   Cardiomyopathy, ischemic    a. 05/2014 Echo: Ef 45-50%, mid-apicalanteroseptal AK, Gr 2 DD.   Cataract    Dementia (HCC)    Fracture, ankle 06/12/2014   Hyperlipidemia    Hypertension    Myocardial infarction [facility]) 2015   non STEMI   Nephrolithiasis 01/16/2013   NSTEMI (non-ST elevated myocardial infarction) (HCC)    NSVT (nonsustained  ventricular tachycardia) (HCC)    a. 05/2014 in setting of NSTEMI -  asymptomatic.   Past Surgical History:  Procedure Laterality Date   ANKLE FRACTURE SURGERY     CARDIAC CATHETERIZATION     CARDIAC CATHETERIZATION Right 06/20/2014   Procedure: CORONARY STENT INTERVENTION;  Surgeon: Peter M Swaziland, MD;  [facility] C[facility] LAB;  Service: Cardiovascular;  Laterality: Right;   CATARACT EXTRACTION Bilateral    CORONARY STENT PLACEMENT     LEFT HEART CATHETERIZATION WITH CORONARY ANGIOGRAM N/A 06/20/2014   Procedure: LEFT HEART CATHETERIZATION WITH CORONARY ANGIOGRAM;  Surgeon: Peter M Swaziland, MD;  Location: [facility] CATH LAB;  Service: Cardiovascular;  Laterality: N/A;   NASAL SEPTUM SURGERY     VASECTOMY     Social History:  reports that he quit smoking about 34 years ago. His smoking use included cigarettes. He started smoking about 57 years ago. He has a 23.00 pack-year smoking history. He quit smokeless tobacco use about 34 years ago.  His smokeless tobacco use included chew. He reports current alcohol use of about 7.0 standard drinks of alcohol per week. He reports that he does not use drugs.  No Known Allergies  Family History  Problem Relation Age of Onset   Hypertension Mother    Cancer Father        bladder ca   Hyperlipidemia Father    Alcohol abuse Father    Hyperlipidemia Sister    Cancer Brother    Nephrolithiasis Brother    Colon  cancer Neg Hx    Stomach cancer Neg Hx    Esophageal cancer Neg Hx     Prior to Admission medications   Medication Sig Start Date End Date Taking? Authorizing Provider  aspirin EC 81 MG EC tablet Take 1 tablet (81 mg total) by mouth daily. 06/21/14   Creig Hines, NP  atorvastatin (LIPITOR) 40 MG tablet TAKE 1 TABLET ONCE DAILY AT BEDTIME Patient taking differently: Take 40 mg by mouth at bedtime. 08/02/21   Daphine Deutscher, [name], FNP  cephALEXin (KEFLEX) 500 MG capsule Take 1 capsule (500 mg total) by mouth 4 (four) times daily for 7  days. 05/17/22 05/24/22  Kneller, Cecile Sheerer, DO  donepezil (ARICEPT) 10 MG tablet Take 1 tablet (10 mg total) by mouth at bedtime. 08/02/21   Daphine Deutscher, [name], FNP  lisinopril (ZESTRIL) 20 MG tablet Take 1 tablet (20 mg total) by mouth daily. 08/02/21   Daphine Deutscher [name], FNP  metoprolol tartrate (LOPRESSOR) 25 MG tablet Take 1 tablet (25 mg total) by mouth 2 (two) times daily. 08/02/21   Daphine Deutscher, [name], FNP  nitroGLYCERIN (NITROSTAT) 0.4 MG SL tablet Place 1 tablet (0.4 mg total) under the tongue every 5 (five) minutes x 3 doses as needed for chest pain. 12/01/16   Daphine Deutscher, [name], FNP  polyethylene glycol powder (MIRALAX) 17 GM/SCOOP powder Take 17 g by mouth 2 (two) times daily as needed for moderate constipation. 02/04/21   Almon Hercules, MD  senna-docusate (SENOKOT-S) 8.6-50 MG tablet Take 1 tablet by mouth 2 (two) times daily between meals as needed for mild constipation. 02/04/21   Almon Hercules, MD    Physical Exam: Vitals:   05/20/22 2124 05/21/22 0128 05/21/22 0130 05/21/22 0140  BP: 96/62  (!) 79/51 (!) 81/49  Pulse: 81   82  Resp: 14  15 17   Temp: 97.9 F (36.6 C) 98 F (36.7 C)    TempSrc: Oral Oral    SpO2: 97%  97% 97%  Constitutional: NAD, calm, comfortable, elderly gentleman laying flat in bed Eyes: lids and conjunctivae normal ENMT: Mucous membranes are moist.  Neck: normal, supple, Respiratory: clear to auscultation bilaterally, no wheezing, no crackles. Normal respiratory effort. No accessory muscle use.  Cardiovascular: Regular rate and rhythm, no murmurs / rubs / gallops. No extremity edema. 2+ pedal pulses. Abdomen: no tenderness,  Bowel sounds positive.  Musculoskeletal: no clubbing / cyanosis. No joint deformity upper and lower extremities.. Normal muscle tone. No grimace with palpitation or movement of right lower extremity. Skin: no rashes, lesions, ulcers.  Neurologic: CN 2-12 grossly intact. Pt disoriented with nonsensical speech which is his  baseline Psychiatric: Normal mood. Data Reviewed:  There are no new results to review at this time.  Assessment and Plan: * Hip fracture (HCC) Acute impacted mildly angulated subcapital right femoral neck fracture -Ortho to see in consultation tomorrow we will keep n.p.o. -PRN hydrocodone and IV morphine for pain -scheduled senna for bowel regimen  Hypotension BP downward trended to 80s in ED. Will give additional NS bolus and keep on continuous IV fluids.  -from SNF documentation appears he has been having issues with low BP. Will hold all antihypertensives.  -No leukocytosis and he is already on antibiotics for UTI -will continue to monitor with SBP goal of >90 and MAP 65 -hold opioids for now until BP normalizes  UTI (urinary tract infection) UTI dx on 9/18- will continue Keflex   AF (paroxysmal atrial fibrillation) (HCC) Rate controlled.  Not on anticoagulation.  Dementia (HCC)  Severe and is completely disoriented at baseline  Hyperlipidemia Continue statin pending med rec.      Advance Care Planning: DNR-per wife who says they have living will stating this Consults: Orthopedic surgery  Family Communication: Discussed with wife over the phone  Severity of Illness: The appropriate patient status for this patient is INPATIENT. Inpatient status is judged to be reasonable and necessary in order to provide the required intensity of service to ensure the patient's safety. The patient's presenting symptoms, physical exam findings, and initial radiographic and laboratory data in the context of their chronic comorbidities is felt to place them at high risk for further clinical deterioration. Furthermore, it is not anticipated that the patient will be medically stable for discharge from the hospital within 2 midnights of admission.   * I certify that at the point of admission it is my clinical judgment that the patient will require inpatient hospital care spanning beyond 2  midnights from the point of admission due to high intensity of service, high risk for further deterioration and high frequency of surveillance required.*  Author: Orene Desanctis, DO 05/21/2022 1:53 AM  For on call review [email or web address].

## 2022-05-21 NOTE — Assessment & Plan Note (Addendum)
Acute impacted mildly angulated subcapital right femoral neck fracture -Ortho to see in consultation tomorrow we will keep n.p.o. -PRN hydrocodone and IV morphine for pain -scheduled senna for bowel regimen

## 2022-05-21 NOTE — Assessment & Plan Note (Deleted)
Continue home antihypertensives pending med rec 

## 2022-05-21 NOTE — Assessment & Plan Note (Signed)
-  Rate controlled. Not on anticoagulation 

## 2022-05-21 NOTE — Assessment & Plan Note (Signed)
Continue statin pending med rec 

## 2022-05-21 NOTE — Assessment & Plan Note (Signed)
UTI dx on 9/18- will continue Keflex

## 2022-05-21 NOTE — Progress Notes (Signed)
Admission not done, pt is confuse. Family is not present at bedside.

## 2022-05-22 ENCOUNTER — Inpatient Hospital Stay (HOSPITAL_COMMUNITY): Payer: Medicare Other | Admitting: Anesthesiology

## 2022-05-22 ENCOUNTER — Encounter (HOSPITAL_COMMUNITY): Payer: Self-pay | Admitting: Family Medicine

## 2022-05-22 ENCOUNTER — Inpatient Hospital Stay (HOSPITAL_COMMUNITY): Payer: Medicare Other

## 2022-05-22 ENCOUNTER — Other Ambulatory Visit: Payer: Self-pay

## 2022-05-22 ENCOUNTER — Encounter (HOSPITAL_COMMUNITY)
Admission: EM | Disposition: A | Discharge status: Skilled Nursing Facility | Payer: Self-pay | Source: Skilled Nursing Facility | Attending: Internal Medicine

## 2022-05-22 DIAGNOSIS — I2510 Atherosclerotic heart disease of native coronary artery without angina pectoris: Secondary | ICD-10-CM

## 2022-05-22 DIAGNOSIS — S72001A Fracture of unspecified part of neck of right femur, initial encounter for closed fracture: Secondary | ICD-10-CM | POA: Diagnosis not present

## 2022-05-22 DIAGNOSIS — I110 Hypertensive heart disease with heart failure: Secondary | ICD-10-CM | POA: Diagnosis not present

## 2022-05-22 DIAGNOSIS — I509 Heart failure, unspecified: Secondary | ICD-10-CM

## 2022-05-22 DIAGNOSIS — I252 Old myocardial infarction: Secondary | ICD-10-CM

## 2022-05-22 DIAGNOSIS — Z87891 Personal history of nicotine dependence: Secondary | ICD-10-CM

## 2022-05-22 HISTORY — PX: TOTAL HIP ARTHROPLASTY: SHX124

## 2022-05-22 LAB — CBC
HCT: 35.2 % — ABNORMAL LOW (ref 39.0–52.0)
Hemoglobin: 12 g/dL — ABNORMAL LOW (ref 13.0–17.0)
MCH: 33.2 pg (ref 26.0–34.0)
MCHC: 34.1 g/dL (ref 30.0–36.0)
MCV: 97.5 fL (ref 80.0–100.0)
Platelets: 201 10*3/uL (ref 150–400)
RBC: 3.61 MIL/uL — ABNORMAL LOW (ref 4.22–5.81)
RDW: 12.5 % (ref 11.5–15.5)
WBC: 8.8 10*3/uL (ref 4.0–10.5)
nRBC: 0 % (ref 0.0–0.2)

## 2022-05-22 LAB — TYPE AND SCREEN
ABO/RH(D): A NEG
Antibody Screen: NEGATIVE

## 2022-05-22 LAB — VITAMIN D 25 HYDROXY (VIT D DEFICIENCY, FRACTURES): Vit D, 25-Hydroxy: 36.87 ng/mL (ref 30–100)

## 2022-05-22 LAB — MRSA NEXT GEN BY PCR, NASAL: MRSA by PCR Next Gen: DETECTED — AB

## 2022-05-22 LAB — ABO/RH: ABO/RH(D): A NEG

## 2022-05-22 SURGERY — ARTHROPLASTY, HIP, TOTAL, ANTERIOR APPROACH
Anesthesia: Spinal | Site: Hip | Laterality: Right

## 2022-05-22 MED ORDER — PROPOFOL 10 MG/ML IV BOLUS
INTRAVENOUS | Status: DC | PRN
  Administered 2022-05-22: 30 mg via INTRAVENOUS

## 2022-05-22 MED ORDER — 0.9 % SODIUM CHLORIDE (POUR BTL) OPTIME
TOPICAL | Status: DC | PRN
  Administered 2022-05-22: 1000 mL

## 2022-05-22 MED ORDER — DEXAMETHASONE SODIUM PHOSPHATE 10 MG/ML IJ SOLN
INTRAMUSCULAR | Status: AC
  Filled 2022-05-22: qty 1

## 2022-05-22 MED ORDER — DEXAMETHASONE SODIUM PHOSPHATE 10 MG/ML IJ SOLN
INTRAMUSCULAR | Status: DC | PRN
  Administered 2022-05-22: 10 mg via INTRAVENOUS

## 2022-05-22 MED ORDER — MEMANTINE HCL 10 MG PO TABS
10.0000 mg | ORAL_TABLET | Freq: Two times a day (BID) | ORAL | Status: DC
  Administered 2022-05-22 – 2022-05-27 (×8): 10 mg via ORAL
  Filled 2022-05-22 (×10): qty 1

## 2022-05-22 MED ORDER — LACTATED RINGERS IV SOLN: INTRAVENOUS | Status: DC

## 2022-05-22 MED ORDER — CHLORHEXIDINE GLUCONATE CLOTH 2 % EX PADS
6.0000 | MEDICATED_PAD | Freq: Every day | CUTANEOUS | Status: DC
  Administered 2022-05-22: 6 via TOPICAL

## 2022-05-22 MED ORDER — LIDOCAINE 2% (20 MG/ML) 5 ML SYRINGE
INTRAMUSCULAR | Status: AC
  Filled 2022-05-22: qty 5

## 2022-05-22 MED ORDER — MENTHOL 3 MG MT LOZG: 1.0000 | LOZENGE | OROMUCOSAL | Status: DC | PRN

## 2022-05-22 MED ORDER — ONDANSETRON HCL 4 MG PO TABS: 4.0000 mg | ORAL_TABLET | Freq: Four times a day (QID) | ORAL | Status: DC | PRN

## 2022-05-22 MED ORDER — KETOROLAC TROMETHAMINE 30 MG/ML IJ SOLN
INTRAMUSCULAR | Status: DC | PRN
  Administered 2022-05-22: 30 mg via INTRA_ARTICULAR

## 2022-05-22 MED ORDER — GLYCOPYRROLATE 0.2 MG/ML IJ SOLN
INTRAMUSCULAR | Status: DC | PRN
  Administered 2022-05-22: .2 mg via INTRAVENOUS

## 2022-05-22 MED ORDER — FENTANYL CITRATE (PF) 250 MCG/5ML IJ SOLN
INTRAMUSCULAR | Status: DC | PRN
  Administered 2022-05-22: 25 ug via INTRAVENOUS

## 2022-05-22 MED ORDER — SODIUM CHLORIDE (PF) 0.9 % IJ SOLN
INTRAMUSCULAR | Status: DC | PRN
  Administered 2022-05-22: 30 mL

## 2022-05-22 MED ORDER — BISACODYL 5 MG PO TBEC: 5.0000 mg | DELAYED_RELEASE_TABLET | Freq: Every day | ORAL | Status: DC | PRN

## 2022-05-22 MED ORDER — METOCLOPRAMIDE HCL 5 MG/ML IJ SOLN: 5.0000 mg | Freq: Three times a day (TID) | INTRAMUSCULAR | Status: DC | PRN

## 2022-05-22 MED ORDER — LACTATED RINGERS IV SOLN: INTRAVENOUS | Status: DC | PRN

## 2022-05-22 MED ORDER — PHENYLEPHRINE 80 MCG/ML (10ML) SYRINGE FOR IV PUSH (FOR BLOOD PRESSURE SUPPORT)
PREFILLED_SYRINGE | INTRAVENOUS | Status: DC | PRN
  Administered 2022-05-22: 160 ug via INTRAVENOUS

## 2022-05-22 MED ORDER — CHLORHEXIDINE GLUCONATE 0.12 % MT SOLN: 15.0000 mL | Freq: Once | OROMUCOSAL | Status: AC

## 2022-05-22 MED ORDER — DOCUSATE SODIUM 100 MG PO CAPS
100.0000 mg | ORAL_CAPSULE | Freq: Two times a day (BID) | ORAL | Status: DC
  Administered 2022-05-22 – 2022-05-27 (×5): 100 mg via ORAL
  Filled 2022-05-22 (×10): qty 1

## 2022-05-22 MED ORDER — BUPIVACAINE-EPINEPHRINE (PF) 0.5% -1:200000 IJ SOLN
INTRAMUSCULAR | Status: AC
  Filled 2022-05-22: qty 30

## 2022-05-22 MED ORDER — SENNA 8.6 MG PO TABS
1.0000 | ORAL_TABLET | Freq: Two times a day (BID) | ORAL | Status: DC
  Administered 2022-05-22 – 2022-05-26 (×7): 8.6 mg via ORAL
  Filled 2022-05-22 (×10): qty 1

## 2022-05-22 MED ORDER — SODIUM CHLORIDE 0.9 % IV SOLN
INTRAVENOUS | Status: AC | PRN
  Administered 2022-05-22: 250 mL

## 2022-05-22 MED ORDER — PHENYLEPHRINE HCL-NACL 20-0.9 MG/250ML-% IV SOLN
INTRAVENOUS | Status: DC | PRN
  Administered 2022-05-22: 25 ug/min via INTRAVENOUS

## 2022-05-22 MED ORDER — ORAL CARE MOUTH RINSE: 15.0000 mL | Freq: Once | OROMUCOSAL | Status: AC

## 2022-05-22 MED ORDER — ASPIRIN 325 MG PO TBEC
325.0000 mg | DELAYED_RELEASE_TABLET | Freq: Every day | ORAL | Status: DC
  Administered 2022-05-23 – 2022-05-27 (×5): 325 mg via ORAL
  Filled 2022-05-22 (×5): qty 1

## 2022-05-22 MED ORDER — FENTANYL CITRATE (PF) 250 MCG/5ML IJ SOLN
INTRAMUSCULAR | Status: AC
  Filled 2022-05-22: qty 5

## 2022-05-22 MED ORDER — POLYETHYLENE GLYCOL 3350 17 G PO PACK: 17.0000 g | PACK | Freq: Every day | ORAL | Status: DC | PRN

## 2022-05-22 MED ORDER — FENTANYL CITRATE (PF) 100 MCG/2ML IJ SOLN: 25.0000 ug | INTRAMUSCULAR | Status: DC | PRN

## 2022-05-22 MED ORDER — BUPIVACAINE IN DEXTROSE 0.75-8.25 % IT SOLN
INTRATHECAL | Status: DC | PRN
  Administered 2022-05-22: 1.8 mL via INTRATHECAL

## 2022-05-22 MED ORDER — PROPOFOL 500 MG/50ML IV EMUL
INTRAVENOUS | Status: DC | PRN
  Administered 2022-05-22: 65 ug/kg/min via INTRAVENOUS

## 2022-05-22 MED ORDER — LIDOCAINE 2% (20 MG/ML) 5 ML SYRINGE
INTRAMUSCULAR | Status: DC | PRN
  Administered 2022-05-22: 20 mg via INTRAVENOUS

## 2022-05-22 MED ORDER — MUPIROCIN 2 % EX OINT
1.0000 | TOPICAL_OINTMENT | Freq: Two times a day (BID) | CUTANEOUS | Status: DC
  Administered 2022-05-22 – 2022-05-26 (×9): 1 via NASAL
  Filled 2022-05-22 (×3): qty 22

## 2022-05-22 MED ORDER — ONDANSETRON HCL 4 MG/2ML IJ SOLN
INTRAMUSCULAR | Status: AC
  Filled 2022-05-22: qty 2

## 2022-05-22 MED ORDER — ONDANSETRON HCL 4 MG/2ML IJ SOLN
INTRAMUSCULAR | Status: DC | PRN
  Administered 2022-05-22: 4 mg via INTRAVENOUS

## 2022-05-22 MED ORDER — ACETAMINOPHEN 10 MG/ML IV SOLN: 1000.0000 mg | Freq: Once | INTRAVENOUS | Status: DC | PRN

## 2022-05-22 MED ORDER — ONDANSETRON HCL 4 MG/2ML IJ SOLN: 4.0000 mg | Freq: Four times a day (QID) | INTRAMUSCULAR | Status: DC | PRN

## 2022-05-22 MED ORDER — PROPOFOL 10 MG/ML IV BOLUS
INTRAVENOUS | Status: AC
  Filled 2022-05-22: qty 20

## 2022-05-22 MED ORDER — EPHEDRINE SULFATE-NACL 50-0.9 MG/10ML-% IV SOSY
PREFILLED_SYRINGE | INTRAVENOUS | Status: DC | PRN
  Administered 2022-05-22: 10 mg via INTRAVENOUS
  Administered 2022-05-22: 5 mg via INTRAVENOUS
  Administered 2022-05-22: 10 mg via INTRAVENOUS
  Administered 2022-05-22: 5 mg via INTRAVENOUS

## 2022-05-22 MED ORDER — KETOROLAC TROMETHAMINE 30 MG/ML IJ SOLN
INTRAMUSCULAR | Status: AC
  Filled 2022-05-22: qty 1

## 2022-05-22 MED ORDER — SODIUM CHLORIDE 0.9 % IR SOLN
Status: DC | PRN
  Administered 2022-05-22: 3000 mL

## 2022-05-22 MED ORDER — PHENOL 1.4 % MT LIQD: 1.0000 | OROMUCOSAL | Status: DC | PRN

## 2022-05-22 MED ORDER — CHLORHEXIDINE GLUCONATE 0.12 % MT SOLN
OROMUCOSAL | Status: AC
  Administered 2022-05-22: 15 mL via OROMUCOSAL
  Filled 2022-05-22: qty 15

## 2022-05-22 MED ORDER — ONDANSETRON HCL 4 MG/2ML IJ SOLN: 4.0000 mg | Freq: Once | INTRAMUSCULAR | Status: DC | PRN

## 2022-05-22 MED ORDER — AMISULPRIDE (ANTIEMETIC) 5 MG/2ML IV SOLN: 10.0000 mg | Freq: Once | INTRAVENOUS | Status: DC | PRN

## 2022-05-22 MED ORDER — CEFAZOLIN SODIUM-DEXTROSE 2-4 GM/100ML-% IV SOLN
2.0000 g | Freq: Four times a day (QID) | INTRAVENOUS | Status: AC
  Administered 2022-05-22 (×2): 2 g via INTRAVENOUS
  Filled 2022-05-22 (×2): qty 100

## 2022-05-22 MED ORDER — METOCLOPRAMIDE HCL 5 MG PO TABS: 5.0000 mg | ORAL_TABLET | Freq: Three times a day (TID) | ORAL | Status: DC | PRN

## 2022-05-22 MED ORDER — IRRISEPT - 450ML BOTTLE WITH 0.05% CHG IN STERILE WATER, USP 99.95% OPTIME
TOPICAL | Status: DC | PRN
  Administered 2022-05-22: 450 mL

## 2022-05-22 MED ORDER — FLEET ENEMA 7-19 GM/118ML RE ENEM: 1.0000 | ENEMA | Freq: Once | RECTAL | Status: DC | PRN

## 2022-05-22 MED ORDER — BUPIVACAINE-EPINEPHRINE (PF) 0.5% -1:200000 IJ SOLN
INTRAMUSCULAR | Status: DC | PRN
  Administered 2022-05-22: 30 mL

## 2022-05-22 SURGICAL SUPPLY — 59 items
ADH SKN CLS APL DERMABOND .7 (GAUZE/BANDAGES/DRESSINGS) ×1
ALCOHOL 70% 16 OZ (MISCELLANEOUS) ×2 IMPLANT
APL PRP STRL LF DISP 70% ISPRP (MISCELLANEOUS) ×1
BAG COUNTER SPONGE SURGICOUNT (BAG) ×2 IMPLANT
BAG SPNG CNTER NS LX DISP (BAG) ×1
BLADE CLIPPER SURG (BLADE) IMPLANT
CATH COUDE FOLEY 2W 5CC 16FR (CATHETERS) IMPLANT
CHLORAPREP W/TINT 26 (MISCELLANEOUS) ×2 IMPLANT
COVER SURGICAL LIGHT HANDLE (MISCELLANEOUS) ×2 IMPLANT
DERMABOND ADVANCED .7 DNX12 (GAUZE/BANDAGES/DRESSINGS) IMPLANT
DRAPE C-ARM 42X72 X-RAY (DRAPES) ×2 IMPLANT
DRAPE STERI IOBAN 125X83 (DRAPES) ×2 IMPLANT
DRAPE U-SHAPE 47X51 STRL (DRAPES) ×6 IMPLANT
DRSG AQUACEL AG ADV 3.5X10 (GAUZE/BANDAGES/DRESSINGS) IMPLANT
ELECT BLADE 4.0 EZ CLEAN MEGAD (MISCELLANEOUS) ×1
ELECT REM PT RETURN 9FT ADLT (ELECTROSURGICAL) ×1
ELECTRODE BLDE 4.0 EZ CLN MEGD (MISCELLANEOUS) ×2 IMPLANT
ELECTRODE REM PT RTRN 9FT ADLT (ELECTROSURGICAL) ×2 IMPLANT
GLOVE BIO SURGEON STRL SZ8.5 (GLOVE) ×4 IMPLANT
GLOVE BIOGEL M 7.0 STRL (GLOVE) ×2 IMPLANT
GLOVE BIOGEL PI IND STRL 7.5 (GLOVE) ×2 IMPLANT
GLOVE BIOGEL PI IND STRL 8.5 (GLOVE) ×2 IMPLANT
GOWN STRL REUS W/ TWL LRG LVL3 (GOWN DISPOSABLE) ×4 IMPLANT
GOWN STRL REUS W/TWL 2XL LVL3 (GOWN DISPOSABLE) ×2 IMPLANT
GOWN STRL REUS W/TWL LRG LVL3 (GOWN DISPOSABLE) ×2
HANDPIECE INTERPULSE COAX TIP (DISPOSABLE) ×1
HEAD MOD COCR 28MM HD -3MM NK (Orthopedic Implant) IMPLANT
HOOD PEEL AWAY FACE SHEILD DIS (HOOD) ×4 IMPLANT
JET LAVAGE IRRISEPT WOUND (IRRIGATION / IRRIGATOR) ×1
KIT BASIN OR (CUSTOM PROCEDURE TRAY) ×2 IMPLANT
KIT TURNOVER KIT B (KITS) ×2 IMPLANT
LAVAGE JET IRRISEPT WOUND (IRRIGATION / IRRIGATOR) ×2 IMPLANT
MANIFOLD NEPTUNE II (INSTRUMENTS) ×2 IMPLANT
MARKER SKIN DUAL TIP RULER LAB (MISCELLANEOUS) ×4 IMPLANT
NDL SPNL 18GX3.5 QUINCKE PK (NEEDLE) ×2 IMPLANT
NEEDLE SPNL 18GX3.5 QUINCKE PK (NEEDLE) ×1 IMPLANT
NS IRRIG 1000ML POUR BTL (IV SOLUTION) ×2 IMPLANT
PACK TOTAL JOINT (CUSTOM PROCEDURE TRAY) ×2 IMPLANT
PACK UNIVERSAL I (CUSTOM PROCEDURE TRAY) ×2 IMPLANT
PAD ARMBOARD 7.5X6 YLW CONV (MISCELLANEOUS) ×4 IMPLANT
RINGBLOC BI POLAR 28X51MM (Orthopedic Implant) ×1 IMPLANT
SAW OSC TIP CART 19.5X105X1.3 (SAW) ×2 IMPLANT
SEALER BIPOLAR AQUA 6.0 (INSTRUMENTS) IMPLANT
SET HNDPC FAN SPRY TIP SCT (DISPOSABLE) ×2 IMPLANT
SHELL RINGBLOC BI POLR 28X51MM (Orthopedic Implant) IMPLANT
STAPLER VISISTAT 35W (STAPLE) IMPLANT
STEM FEM CMTLS STD 15X150 123D (Stem) IMPLANT
SUT ETHIBOND NAB CT1 #1 30IN (SUTURE) ×4 IMPLANT
SUT MON AB 2-0 CT1 36 (SUTURE) ×2 IMPLANT
SUT VIC AB 1 CTB1 27 (SUTURE) IMPLANT
SUT VIC AB 2-0 CT1 27 (SUTURE) ×1
SUT VIC AB 2-0 CT1 TAPERPNT 27 (SUTURE) ×2 IMPLANT
SUT VLOC 180 0 24IN GS25 (SUTURE) ×2 IMPLANT
SYR 50ML LL SCALE MARK (SYRINGE) ×2 IMPLANT
TOWEL GREEN STERILE (TOWEL DISPOSABLE) ×2 IMPLANT
TRAY FOLEY W/BAG SLVR 16FR (SET/KITS/TRAYS/PACK) ×1
TRAY FOLEY W/BAG SLVR 16FR ST (SET/KITS/TRAYS/PACK) IMPLANT
TUBE SUCT ARGYLE STRL (TUBING) ×2 IMPLANT
WATER STERILE IRR 1000ML POUR (IV SOLUTION) ×6 IMPLANT

## 2022-05-22 NOTE — Progress Notes (Signed)
PROGRESS NOTE    Adam Rowland  SKA:[id number] DOB: Sep 11, 1945 DOA: 05/20/2022 PCP: Bennie Pierini, FNP   Brief Narrative:   Adam Rowland is a 76 y.o. male presenting after unwitnessed fall at SNF several days prior to admission with medical history significant of Dementia, atrial fibrillation not on anticoagulation, hypertension, hyperlipidemia and CAD who presents with difficulty weightbearing. Imaging at previous facility was reported negative. Treated for presumed UTI at outside facility.  Admitted to hospital service after imaging in our ED confirmed subcapital R femur neck Fx. Ortho consulted.   Assessment & Plan:   Principal Problem:   Hip fracture (HCC) Active Problems:   Hyperlipidemia   CAD (coronary artery disease)   Dementia (HCC)   AF (paroxysmal atrial fibrillation) (HCC)   UTI (urinary tract infection)   Hypotension   Hip fracture (HCC) Acute impacted mildly angulated subcapital right femoral neck fracture -Ortho to see in consultation - possible ORIF later today -Remains NPO perioperatively -PRN hydrocodone and IV morphine for pain   Hypotension, chronic -ongoing issue per signout from facilty -IVF in ED with minimal response -Unlikely related to subacute UTI -No leukocytosis and he is already completed antibiotics for UTI -Possibly exacerbated by narcotics - high risk for polypharmacy/orthostatics post operatively   UTI (urinary tract infection) POA, resolved prior to admit UTI dx on 9/18- completed abx therapy at this point UA WNL here - follow cultures for completion   AF (paroxysmal atrial fibrillation) (HCC) Rate controlled.  Not on anticoagulation.   Dementia (HCC) Severe and is completely disoriented at baseline   Hyperlipidemia Continue statin pending med rec.  DVT prophylaxis: Per Ortho Code Status: DNR Family Communication: None present  Status is: Inpt  Dispo: The patient is from: SNF              Anticipated d/c is to: TBD -  likely back to facility              Anticipated d/c date is: 48-72h pending surgical course              Patient currently NOT medically stable for discharge  Consultants:  Ortho, Dr Carola Frost  Procedures:  TBD  Antimicrobials:  Perioperatively per ortho   Subjective: No acute issues/events overnight  Objective: Vitals:   05/21/22 1600 05/21/22 2210 05/22/22 0340 05/22/22 0753  BP: 105/65 (!) 98/54 (!) 94/54 (!) 109/52  Pulse: 79 76 66 71  Resp: 15 16 14 19   Temp: 98 F (36.7 C) 98 F (36.7 C)  98.7 F (37.1 C)  TempSrc: Oral Oral  Axillary  SpO2: 97% 96% 99% 96%   No intake or output data in the 24 hours ending 05/22/22 0800 There were no vitals filed for this visit.  Examination:  General:  Pleasantly resting in bed, No acute distress. HEENT:  Normocephalic atraumatic.  Sclerae nonicteric, noninjected.  Extraocular movements intact bilaterally. Neck:  Without mass or deformity.  Trachea is midline. Lungs:  Clear to auscultate bilaterally without rhonchi, wheeze, or rales. Heart:  Regular rate and rhythm.  Without murmurs, rubs, or gallops. Abdomen:  Soft, nontender, nondistended.  Without guarding or rebound. Extremities: Without cyanosis, clubbing, edema, or obvious deformity. Vascular:  Dorsalis pedis and posterior tibial pulses palpable bilaterally. Skin:  Warm and dry, no erythema, no ulcerations.  Data Reviewed: I have personally reviewed following labs and imaging studies  CBC: Recent Labs  Lab 05/17/22 0012 05/21/22 0050 05/22/22 0604  WBC 17.1* 10.4 8.8  NEUTROABS 15.2* 7.1  --  HGB 14.4 14.1 12.0*  HCT 42.8 43.4 35.2*  MCV 98.2 102.1* 97.5  PLT 218 219 161    Basic Metabolic Panel: Recent Labs  Lab 05/17/22 0012 05/21/22 0050  NA 138 145  K 4.4 4.1  CL 107 115*  CO2 22 22  GLUCOSE 148* 100*  BUN 23 28*  CREATININE 1.09 0.89  CALCIUM 9.0 8.6*    GFR: Estimated Creatinine Clearance: 71.7 mL/min (by C-G formula based on SCr of 0.89  mg/dL). Liver Function Tests: Recent Labs  Lab 05/21/22 0050  AST 51*  ALT 37  ALKPHOS 53  BILITOT 0.7  PROT 5.8*  ALBUMIN 3.0*    No results for input(s): "LIPASE", "AMYLASE" in the last 168 hours. No results for input(s): "AMMONIA" in the last 168 hours. Coagulation Profile: No results for input(s): "INR", "PROTIME" in the last 168 hours. Cardiac Enzymes: Recent Labs  Lab 05/17/22 0012  CKTOTAL 57    BNP (last 3 results) No results for input(s): "PROBNP" in the last 8760 hours. HbA1C: No results for input(s): "HGBA1C" in the last 72 hours. CBG: No results for input(s): "GLUCAP" in the last 168 hours. Lipid Profile: No results for input(s): "CHOL", "HDL", "LDLCALC", "TRIG", "CHOLHDL", "LDLDIRECT" in the last 72 hours. Thyroid Function Tests: No results for input(s): "TSH", "T4TOTAL", "FREET4", "T3FREE", "THYROIDAB" in the last 72 hours. Anemia Panel: No results for input(s): "VITAMINB12", "FOLATE", "FERRITIN", "TIBC", "IRON", "RETICCTPCT" in the last 72 hours. Sepsis Labs: No results for input(s): "PROCALCITON", "LATICACIDVEN" in the last 168 hours.  Recent Results (from the past 240 hour(s))  MRSA Next Gen by PCR, Nasal     Status: Abnormal   Collection Time: 05/21/22  4:54 PM   Specimen: Nasal Mucosa; Nasal Swab  Result Value Ref Range Status   MRSA by PCR Next Gen DETECTED (A) NOT DETECTED Final    Comment: CRITICAL RESULT CALLED TO, READ BACK BY AND VERIFIED WITH:  C/ ERIC N., RN 05/22/22 0005 A. LAFRANCE (NOTE) The GeneXpert MRSA Assay (FDA approved for NASAL specimens only), is one component of a comprehensive MRSA colonization surveillance program. It is not intended to diagnose MRSA infection nor to guide or monitor treatment for MRSA infections. Test performance is not FDA approved in patients less than 10 years old. Performed at [facility], [place] [address] [place], [place] [address]          Radiology Studies: DG Elbow 2 Views  Left  Result Date: 05/21/2022 CLINICAL DATA:  Left elbow swelling. EXAM: LEFT ELBOW - 2 VIEW COMPARISON:  None Available. FINDINGS: There is no evidence of fracture, dislocation, or joint effusion. There is no evidence of arthropathy or other focal bone abnormality. Soft tissues are unremarkable. IMPRESSION: Negative. Electronically Signed   By: Fidela Salisbury M.D.   On: 05/21/2022 17:42   CT HIP RIGHT WO CONTRAST  Result Date: 05/21/2022 CLINICAL DATA:  Right hip fracture EXAM: CT OF THE RIGHT HIP WITHOUT CONTRAST TECHNIQUE: Multidetector CT imaging of the right hip was performed according to the standard protocol. Multiplanar CT image reconstructions were also generated. RADIATION DOSE REDUCTION: This exam was performed according to the departmental dose-optimization program which includes automated exposure control, adjustment of the mA and/or kV according to patient size and/or use of iterative reconstruction technique. COMPARISON:  X-ray 05/20/2022 FINDINGS: Bones/Joint/Cartilage Acute right femoral neck fracture, predominantly subcapital in location. Fracture is moderately impacted with shortening. Extremities slightly externally rotated. No fracture involvement of the intertrochanteric region. Hip joint intact without dislocation.  Moderate joint space narrowing of the right hip. The included portion of the right hemipelvis is intact without fracture or diastasis. Ligaments Suboptimally assessed by CT. Muscles and Tendons No acute musculotendinous abnormality by CT. Soft tissues Mild soft tissue edema at the lateral aspect of the hip. No organized hematoma. No right inguinal lymphadenopathy. Atherosclerotic vascular calcifications. IMPRESSION: Acute moderately impacted right femoral neck fracture, as described above. Electronically Signed   By: Duanne Guess D.O.   On: 05/21/2022 15:11   DG Foot Complete Right  Result Date: 05/21/2022 CLINICAL DATA:  Fall.  Patient will not bear weight. EXAM:  RIGHT FOOT COMPLETE - 3+ VIEW COMPARISON:  None Available. FINDINGS: There is no evidence of fracture or dislocation. There is no evidence of arthropathy or other focal bone abnormality. Soft tissues are unremarkable. IMPRESSION: No fracture or traumatic malalignment. Vascular calcifications noted. Electronically Signed   By: Gerome Sam III M.D.   On: 05/21/2022 07:53   DG Chest 2 View  Result Date: 05/21/2022 CLINICAL DATA:  fall 4 days ago; medical history significant for hyperlipidemia, hypertension, and cardiomyopathy EXAM: CHEST - 2 VIEW COMPARISON:  Radiographs 05/17/2022 FINDINGS: No focal consolidation, pleural effusion, or pneumothorax. Normal cardiomediastinal silhouette. No acute osseous abnormality. Coronary stenting. Aortic atherosclerotic calcification. IMPRESSION: No active cardiopulmonary disease. Electronically Signed   By: Minerva Fester M.D.   On: 05/21/2022 00:14   DG Femur Min 2 Views Right  Result Date: 05/20/2022 CLINICAL DATA:  Fall, right leg pain EXAM: RIGHT FEMUR 2 VIEWS COMPARISON:  None Available. FINDINGS: Two view radiograph right femur demonstrates an acute impacted, mildly medially angulated subcapital right femoral neck fracture. Femoral head is still seated within the right acetabulum. Superimposed mild to moderate right hip degenerative arthritis with asymmetric joint space narrowing. Advanced vascular calcifications are noted within the medial right thigh. The right femur distally appears intact. IMPRESSION: Acute impacted, mildly medially angulated subcapital right femoral neck fracture. Electronically Signed   By: Helyn Numbers M.D.   On: 05/20/2022 23:03   DG Tibia/Fibula Right  Result Date: 05/20/2022 CLINICAL DATA:  Fall, right leg pain EXAM: RIGHT TIBIA AND FIBULA - 2 VIEW COMPARISON:  None Available. FINDINGS: There is no evidence of fracture or other focal bone lesions. Advanced vascular calcifications are noted within the right lower extremity and  visualized right hindfoot. IMPRESSION: No acute fracture or dislocation. Electronically Signed   By: Helyn Numbers M.D.   On: 05/20/2022 23:01    Scheduled Meds:  acetaminophen  650 mg Oral Q8H   Chlorhexidine Gluconate Cloth  6 each Topical Q0600   mupirocin ointment  1 Application Nasal BID   povidone-iodine  2 Application Topical Once   povidone-iodine  2 Application Topical Once   senna  1 tablet Oral BID   Continuous Infusions:   ceFAZolin (ANCEF) IV     tranexamic acid       LOS: 1 day   Time spent:  Azucena Fallen, DO [facility]  If 7PM-7AM, please contact night-coverage [email or web address]  05/22/2022, 8:00 AM

## 2022-05-22 NOTE — Transfer of Care (Signed)
Immediate Anesthesia Transfer of Care Note  Patient: Adam Rowland  Procedure(s) Performed: HEMI- ARTHROPLASTY ANTERIOR APPROACH (Right: Hip)  Patient Location: PACU  Anesthesia Type:MAC and Spinal  Level of Consciousness: drowsy  Airway & Oxygen Therapy: Patient Spontanous Breathing and Patient connected to face mask oxygen  Post-op Assessment: Report given to RN and Post -op Vital signs reviewed and stable  Post vital signs: Reviewed and stable  Last Vitals:  Vitals Value Taken Time  BP 119/99 05/22/22 1415  Temp    Pulse 76 05/22/22 1415  Resp 19 05/22/22 1416  SpO2 92 % 05/22/22 1412  Vitals shown include unvalidated device data.  Last Pain:  Vitals:   05/22/22 0943  TempSrc: Oral  PainSc: 0-No pain         Complications: No notable events documented.

## 2022-05-22 NOTE — Anesthesia Procedure Notes (Signed)
Procedure Name: MAC Date/Time: 05/22/2022 12:47 PM  Performed by: Lavell Luster, CRNAPre-anesthesia Checklist: Patient identified, Emergency Drugs available, Suction available, Patient being monitored and Timeout performed Patient Re-evaluated:Patient Re-evaluated prior to induction Oxygen Delivery Method: Simple face mask Preoxygenation: Pre-oxygenation with 100% oxygen Induction Type: IV induction Placement Confirmation: breath sounds checked- equal and bilateral and positive ETCO2 Dental Injury: Teeth and Oropharynx as per pre-operative assessment

## 2022-05-22 NOTE — Progress Notes (Signed)
SLP Cancellation Note  Patient Details Name: Adam Rowland MRN: [id number] DOB: Jul 16, 1946   Cancelled treatment:       Reason Eval/Treat Not Completed: Patient at procedure or test/unavailable. SLP will follow for readiness.  Sonia Baller, MA, CCC-SLP Speech Therapy

## 2022-05-22 NOTE — Consult Note (Signed)
ORTHOPAEDIC CONSULTATION  REQUESTING PHYSICIAN: Adam Ishikawa, MD  PCP:  Adam Pretty, FNP  Chief Complaint: right hip fracture  HPI: [facility] is a 76 y.o. male with a PMHx of dementia, atrial fibrillation not on anticoagulation, hypertension, hyperlipidemia and CAD who resides in a memory care facility.  He apparently had a fall about a week ago and has been refusing to weight-bear.  He was brought to the emergency department at [facility], where x-rays revealed a displaced right femoral neck fracture.  He was admitted by the hospitalist service for perioperative medical risk stratification.  I was asked to see the patient by [facility] due to my expertise in adult reconstruction.  Past Medical History:  Diagnosis Date   Acute metabolic encephalopathy    CAD (coronary artery disease)    a. 05/2014 NSTEMI/PCI: LM nl, LAD 42m (3.0x23 Xience Alpine DES), D1 90, LCX 109m, OM1 80, RCA 30p/d, RPDA 40-50p, EF 40-45% ->Plan for staged PCI of LCX/OM1.   Cardiomyopathy, ischemic    a. 05/2014 Echo: Ef 45-50%, mid-apicalanteroseptal AK, Gr 2 DD.   Cataract    Dementia ([place])    Fracture, ankle 06/12/2014   Hyperlipidemia    Hypertension    Myocardial infarction [facility] [place]) 2015   non STEMI   Nephrolithiasis 01/16/2013   NSTEMI (non-ST elevated myocardial infarction) ([place])    NSVT (nonsustained ventricular tachycardia) ([place])    a. 05/2014 in setting of NSTEMI -  asymptomatic.   Past Surgical History:  Procedure Laterality Date   ANKLE FRACTURE SURGERY     CARDIAC CATHETERIZATION     CARDIAC CATHETERIZATION Right 06/20/2014   Procedure: CORONARY STENT INTERVENTION;  Surgeon: Peter M Martinique, MD;  Location: [facility] C[facility] LAB;  Service: Cardiovascular;  Laterality: Right;   CATARACT EXTRACTION Bilateral    CORONARY STENT PLACEMENT     LEFT HEART CATHETERIZATION WITH CORONARY ANGIOGRAM N/A 06/20/2014   Procedure: LEFT HEART CATHETERIZATION WITH CORONARY ANGIOGRAM;  Surgeon:  Peter M Martinique, MD;  Location: [facility];  Service: Cardiovascular;  Laterality: N/A;   NASAL SEPTUM SURGERY     VASECTOMY     Social History   Socioeconomic History   Marital status: Married    Spouse name: [facility]   Number of children: 1   Years of education: 16   Highest education level: Bachelor's degree (e.g., BA, AB, BS)  Occupational History   Occupation: Retired  Tobacco Use   Smoking status: Former    Packs/day: 1.00    Years: 23.00    Total pack years: 23.00    Types: Cigarettes    Start date: 10/26/1964    Quit date: 08/30/1987    Years since quitting: 34.7   Smokeless tobacco: Former    Types: Chew    Quit date: 02/27/1988   Tobacco comments:    quit in 1989, smoked since he was a teenager/chewed tobacco for about 6 months after he quit smoking cigarettes  Vaping Use   Vaping Use: Never used  Substance and Sexual Activity   Alcohol use: Yes    Alcohol/week: 7.0 standard drinks of alcohol    Types: 7 Glasses of wine per week    Comment: 1 glass of wine nightly    Drug use: No   Sexual activity: Not Currently  Other Topics Concern   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 He has one son and one granddaughter - they live nearby.  Social Determinants of Health   Financial Resource Strain: Low Risk  (06/15/2021)   Overall Financial Resource Strain (CARDIA)    Difficulty of Paying Living Expenses: Not hard at all  Food Insecurity: No Food Insecurity (06/15/2021)   Hunger Vital Sign    Worried About Running Out of Food in the Last Year: Never true    Ran Out of Food in the Last Year: Never true  Transportation Needs: No Transportation Needs (06/15/2021)   PRAPARE - Hydrologist (Medical): No    Lack of Transportation (Non-Medical): No  Physical Activity: Insufficiently Active (06/15/2021)   Exercise Vital Sign    Days of Exercise per Week: 4 days    Minutes of Exercise per Session: 30  min  Stress: No Stress Concern Present (06/15/2021)   [place]    Feeling of Stress : Not at all  Social Connections: [place] (06/15/2021)   Social Connection and Isolation Panel [NHANES]    Frequency of Communication with Friends and Family: More than three times a week    Frequency of Social Gatherings with Friends and Family: More than three times a week    Attends Religious Services: More than 4 times per year    Active Member of [employer] or Organizations: Yes    Attends Music therapist: More than 4 times per year    Marital Status: Married   Family History  Problem Relation Age of Onset   Hypertension Mother    Cancer Father        bladder ca   Hyperlipidemia Father    Alcohol abuse Father    Hyperlipidemia Sister    Cancer Brother    Nephrolithiasis Brother    Colon cancer Neg Hx    Stomach cancer Neg Hx    Esophageal cancer Neg Hx    No Known Allergies Prior to Admission medications   Medication Sig Start Date End Date Taking? Authorizing Provider  aspirin EC 81 MG EC tablet Take 1 tablet (81 mg total) by mouth daily. 06/21/14  Yes Theora Gianotti, NP  atorvastatin (LIPITOR) 40 MG tablet TAKE 1 TABLET ONCE DAILY AT BEDTIME Patient taking differently: Take 40 mg by mouth at bedtime. 08/02/21  Yes Martin, [name], FNP  cephALEXin (KEFLEX) 500 MG capsule Take 1 capsule (500 mg total) by mouth 4 (four) times daily for 7 days. 05/17/22 05/24/22 Yes Kneller, Norman Herrlich, DO  Cholecalciferol (VITAMIN D3) 50 MCG (2000 UT) TABS Take 1 tablet by mouth daily. 01/04/22  Yes [provider]  donepezil (ARICEPT) 10 MG tablet Take 1 tablet (10 mg total) by mouth at bedtime. 08/02/21  Yes Martin, [name], FNP  lisinopril (ZESTRIL) 20 MG tablet Take 1 tablet (20 mg total) by mouth daily. 08/02/21  Yes Martin, [name], FNP  LORazepam (ATIVAN) 0.5 MG tablet Take 0.25 mg by  mouth every 8 (eight) hours as needed for anxiety. 12/24/21  Yes [provider]  memantine (NAMENDA) 10 MG tablet Take 10 mg by mouth 2 (two) times daily. 05/13/22  Yes [provider]  metoprolol tartrate (LOPRESSOR) 25 MG tablet Take 1 tablet (25 mg total) by mouth 2 (two) times daily. 08/02/21  Yes Hassell Done, [name], FNP  nitroGLYCERIN (NITROSTAT) 0.4 MG SL tablet Place 1 tablet (0.4 mg total) under the tongue every 5 (five) minutes x 3 doses as needed for chest pain. 12/01/16  Yes Hassell Done, [name], FNP  polyethylene glycol powder (MIRALAX) 17  GM/SCOOP powder Take 17 g by mouth 2 (two) times daily as needed for moderate constipation. 02/04/21  Yes Mercy Riding, MD  senna-docusate (SENOKOT-S) 8.6-50 MG tablet Take 1 tablet by mouth 2 (two) times daily between meals as needed for mild constipation. 02/04/21  Yes Mercy Riding, MD   DG Elbow 2 Views Left  Result Date: 05/21/2022 CLINICAL DATA:  Left elbow swelling. EXAM: LEFT ELBOW - 2 VIEW COMPARISON:  None Available. FINDINGS: There is no evidence of fracture, dislocation, or joint effusion. There is no evidence of arthropathy or other focal bone abnormality. Soft tissues are unremarkable. IMPRESSION: Negative. Electronically Signed   By: Fidela Salisbury M.D.   On: 05/21/2022 17:42   CT HIP RIGHT WO CONTRAST  Result Date: 05/21/2022 CLINICAL DATA:  Right hip fracture EXAM: CT OF THE RIGHT HIP WITHOUT CONTRAST TECHNIQUE: Multidetector CT imaging of the right hip was performed according to the standard protocol. Multiplanar CT image reconstructions were also generated. RADIATION DOSE REDUCTION: This exam was performed according to the departmental dose-optimization program which includes automated exposure control, adjustment of the mA and/or kV according to patient size and/or use of iterative reconstruction technique. COMPARISON:  X-ray 05/20/2022 FINDINGS: Bones/Joint/Cartilage Acute right femoral neck fracture, predominantly  subcapital in location. Fracture is moderately impacted with shortening. Extremities slightly externally rotated. No fracture involvement of the intertrochanteric region. Hip joint intact without dislocation. Moderate joint space narrowing of the right hip. The included portion of the right hemipelvis is intact without fracture or diastasis. Ligaments Suboptimally assessed by CT. Muscles and Tendons No acute musculotendinous abnormality by CT. Soft tissues Mild soft tissue edema at the lateral aspect of the hip. No organized hematoma. No right inguinal lymphadenopathy. Atherosclerotic vascular calcifications. IMPRESSION: Acute moderately impacted right femoral neck fracture, as described above. Electronically Signed   By: Davina Poke D.O.   On: 05/21/2022 15:11   DG Foot Complete Right  Result Date: 05/21/2022 CLINICAL DATA:  Fall.  Patient will not bear weight. EXAM: RIGHT FOOT COMPLETE - 3+ VIEW COMPARISON:  None Available. FINDINGS: There is no evidence of fracture or dislocation. There is no evidence of arthropathy or other focal bone abnormality. Soft tissues are unremarkable. IMPRESSION: No fracture or traumatic malalignment. Vascular calcifications noted. Electronically Signed   By: Dorise Bullion III M.D.   On: 05/21/2022 07:53   DG Chest 2 View  Result Date: 05/21/2022 CLINICAL DATA:  fall 4 days ago; medical history significant for hyperlipidemia, hypertension, and cardiomyopathy EXAM: CHEST - 2 VIEW COMPARISON:  Radiographs 05/17/2022 FINDINGS: No focal consolidation, pleural effusion, or pneumothorax. Normal cardiomediastinal silhouette. No acute osseous abnormality. Coronary stenting. Aortic atherosclerotic calcification. IMPRESSION: No active cardiopulmonary disease. Electronically Signed   By: Placido Sou M.D.   On: 05/21/2022 00:14   DG Femur Min 2 Views Right  Result Date: 05/20/2022 CLINICAL DATA:  Fall, right leg pain EXAM: RIGHT FEMUR 2 VIEWS COMPARISON:  None Available.  FINDINGS: Two view radiograph right femur demonstrates an acute impacted, mildly medially angulated subcapital right femoral neck fracture. Femoral head is still seated within the right acetabulum. Superimposed mild to moderate right hip degenerative arthritis with asymmetric joint space narrowing. Advanced vascular calcifications are noted within the medial right thigh. The right femur distally appears intact. IMPRESSION: Acute impacted, mildly medially angulated subcapital right femoral neck fracture. Electronically Signed   By: Fidela Salisbury M.D.   On: 05/20/2022 23:03   DG Tibia/Fibula Right  Result Date: 05/20/2022 CLINICAL DATA:  Fall, right leg  pain EXAM: RIGHT TIBIA AND FIBULA - 2 VIEW COMPARISON:  None Available. FINDINGS: There is no evidence of fracture or other focal bone lesions. Advanced vascular calcifications are noted within the right lower extremity and visualized right hindfoot. IMPRESSION: No acute fracture or dislocation. Electronically Signed   By: Fidela Salisbury M.D.   On: 05/20/2022 23:01    Positive ROS: All other systems have been reviewed and were otherwise negative with the exception of those mentioned in the HPI and as above.  Physical Exam: General: Alert, pleasantly confused Cardiovascular: No pedal edema Respiratory: No cyanosis, no use of accessory musculature GI: No organomegaly, abdomen is soft and non-tender Skin: No lesions in the area of chief complaint Neurologic: Sensation intact distally Psychiatric: Altered mental status due to dementia Lymphatic: No axillary or cervical lymphadenopathy  MUSCULOSKELETAL: Examination of the right hip reveals no skin wounds or lesions.  He is shortened and externally rotated.  He has pain with attempted logrolling of the hip.  He has positive motor function dorsiflexion, plantarflexion, and great toe extension.  I unable to assess sensation secondary to mental status.  His foot is well-perfused with a 2+ DP  pulse.  Assessment: Displaced subacute right femoral neck fracture Dementia Multiple medical problems  Plan: I discussed the findings with the patient and his family.  He has an unstable right hip fracture.  I recommend right hip hemiarthroplasty for pain control and immediate mobilization out of bed.  We discussed the risk, benefits, and alternatives.  Please see statement of risk.  We discussed that nonoperative treatment would certainly have a bad outcome due to complications from immobility.  Plan for surgery today.  All questions solicited and answered.  The risks, benefits, and alternatives were discussed with the patient and family. There are risks associated with the surgery including, but not limited to, problems with anesthesia (death), infection, instability (giving out of the joint), dislocation, differences in leg length/angulation/rotation, fracture of bones, loosening or failure of implants, hematoma (blood accumulation) which may require surgical drainage, blood clots, pulmonary embolism, nerve injury (foot drop and lateral thigh numbness), and blood vessel injury. The family understands these risks and elects to proceed.   Bertram Savin, MD [phone]    05/22/2022 11:23 AM

## 2022-05-22 NOTE — Plan of Care (Signed)

## 2022-05-22 NOTE — Anesthesia Procedure Notes (Signed)
Spinal  Patient location during procedure: OR Start time: 05/22/2022 12:00 PM End time: 05/22/2022 12:06 PM Reason for block: surgical anesthesia Staffing Performed: anesthesiologist  Anesthesiologist: Murvin Natal, MD Performed by: Murvin Natal, MD Authorized by: Murvin Natal, MD   Preanesthetic Checklist Completed: patient identified, IV checked, risks and benefits discussed, surgical consent, monitors and equipment checked, pre-op evaluation and timeout performed Spinal Block Patient position: right lateral decubitus Prep: DuraPrep Patient monitoring: cardiac monitor, continuous pulse ox and blood pressure Approach: midline Location: L4-5 Injection technique: single-shot Needle Needle type: Pencan  Needle gauge: 24 G Needle length: 9 cm Assessment Sensory level: T10 Events: CSF return Additional Notes Functioning IV was confirmed and monitors were applied. Sterile prep and drape, including hand hygiene and sterile gloves were used. The patient was positioned and the spine was prepped. The skin was anesthetized with lidocaine.  Free flow of clear CSF was obtained prior to injecting local anesthetic into the CSF.  The spinal needle aspirated freely following injection.  The needle was carefully withdrawn.  The patient tolerated the procedure well.

## 2022-05-22 NOTE — Discharge Instructions (Signed)
? ?Dr. Lasonja Lakins ?Joint Replacement Specialist ?[facility] ?[address] ?[place], [place] [address] ?[phone] ? ? ?TOTAL HIP REPLACEMENT POSTOPERATIVE DIRECTIONS ? ? ? ?Hip Rehabilitation, Guidelines Following Surgery  ? ?WEIGHT BEARING ?Weight bearing as tolerated with assist device (walker, cane, etc) as directed, use it as long as suggested by your surgeon or therapist, typically at least 4-6 weeks. ? ?The results of a hip operation are greatly improved after range of motion and muscle strengthening exercises. Follow all safety measures which are given to protect your hip. If any of these exercises cause increased pain or swelling in your joint, decrease the amount until you are comfortable again. Then slowly increase the exercises. Call your caregiver if you have problems or questions.  ? ?HOME CARE INSTRUCTIONS  ?Most of the following instructions are designed to prevent the dislocation of your new hip.  ?Remove items at home which could result in a fall. This includes throw rugs or furniture in walking pathways.  ?Continue medications as instructed at time of discharge. ?You may have some home medications which will be placed on hold until you complete the course of blood thinner medication. ?You may start showering once you are discharged home. Do not remove your dressing. ?Do not put on socks or shoes without following the instructions of your caregivers.   ?Sit on chairs with arms. Use the chair arms to help push yourself up when arising.  ?Arrange for the use of a toilet seat elevator so you are not sitting low.  ?Walk with walker as instructed.  ?You may resume a sexual relationship in one month or when given the OK by your caregiver.  ?Use walker as long as suggested by your caregivers.  ?You may put full weight on your legs and walk as much as is comfortable. ?Avoid periods of inactivity such as sitting longer than an hour when not asleep. This helps prevent blood  clots.  ?You may return to work once you are cleared by your surgeon.  ?Do not drive a car for 6 weeks or until released by your surgeon.  ?Do not drive while taking narcotics.  ?Wear elastic stockings for two weeks following surgery during the day but you may remove then at night.  ?Make sure you keep all of your appointments after your operation with all of your doctors and caregivers. You should call the office at the above phone number and make an appointment for approximately two weeks after the date of your surgery. ?Please pick up a stool softener and laxative for home use as long as you are requiring pain medications. ?ICE to the affected hip every three hours for 30 minutes at a time and then as needed for pain and swelling. Continue to use ice on the hip for pain and swelling from surgery. You may notice swelling that will progress down to the foot and ankle.  This is normal after surgery.  Elevate the leg when you are not up walking on it.   ?It is important for you to complete the blood thinner medication as prescribed by your doctor. ?Continue to use the breathing machine which will help keep your temperature down.  It is common for your temperature to cycle up and down following surgery, especially at night when you are not up moving around and exerting yourself.  The breathing machine keeps your lungs expanded and your temperature down. ? ?RANGE OF MOTION AND STRENGTHENING EXERCISES  ?These exercises are designed to help you   keep full movement of your hip joint. Follow your caregiver's or physical therapist's instructions. Perform all exercises about fifteen times, three times per day or as directed. Exercise both hips, even if you have had only one joint replacement. These exercises can be done on a training (exercise) mat, on the floor, on a table or on a bed. Use whatever works the best and is most comfortable for you. Use music or television while you are exercising so that the exercises are a  pleasant break in your day. This will make your life better with the exercises acting as a break in routine you can look forward to.  ?Lying on your back, slowly slide your foot toward your buttocks, raising your knee up off the floor. Then slowly slide your foot back down until your leg is straight again.  ?Lying on your back spread your legs as far apart as you can without causing discomfort.  ?Lying on your side, raise your upper leg and foot straight up from the floor as far as is comfortable. Slowly lower the leg and repeat.  ?Lying on your back, tighten up the muscle in the front of your thigh (quadriceps muscles). You can do this by keeping your leg straight and trying to raise your heel off the floor. This helps strengthen the largest muscle supporting your knee.  ?Lying on your back, tighten up the muscles of your buttocks both with the legs straight and with the knee bent at a comfortable angle while keeping your heel on the floor.  ? ?SKILLED REHAB INSTRUCTIONS: ?If the patient is transferred to a skilled rehab facility following release from the hospital, a list of the current medications will be sent to the facility for the patient to continue.  When discharged from the skilled rehab facility, please have the facility set up the patient's Home Health Physical Therapy prior to being released. Also, the skilled facility will be responsible for providing the patient with their medications at time of release from the facility to include their pain medication and their blood thinner medication. If the patient is still at the rehab facility at time of the two week follow up appointment, the skilled rehab facility will also need to assist the patient in arranging follow up appointment in our office and any transportation needs. ? ?POST-OPERATIVE OPIOID TAPER INSTRUCTIONS: ?It is important to wean off of your opioid medication as soon as possible. If you do not need pain medication after your surgery it is ok  to stop day one. ?Opioids include: ?Codeine, Hydrocodone(Norco, Vicodin), Oxycodone(Percocet, oxycontin) and hydromorphone amongst others.  ?Long term and even short term use of opiods can cause: ?Increased pain response ?Dependence ?Constipation ?Depression ?Respiratory depression ?And more.  ?Withdrawal symptoms can include ?Flu like symptoms ?Nausea, vomiting ?And more ?Techniques to manage these symptoms ?Hydrate well ?Eat regular healthy meals ?Stay active ?Use relaxation techniques(deep breathing, meditating, yoga) ?Do Not substitute Alcohol to help with tapering ?If you have been on opioids for less than two weeks and do not have pain than it is ok to stop all together.  ?Plan to wean off of opioids ?This plan should start within one week post op of your joint replacement. ?Maintain the same interval or time between taking each dose and first decrease the dose.  ?Cut the total daily intake of opioids by one tablet each day ?Next start to increase the time between doses. ?The last dose that should be eliminated is the evening dose.  ? ? ?MAKE   SURE YOU:  ?Understand these instructions.  ?Will watch your condition.  ?Will get help right away if you are not doing well or get worse. ? ?Pick up stool softner and laxative for home use following surgery while on pain medications. ?Do not remove your dressing. ?The dressing is waterproof--it is OK to take showers. ?Continue to use ice for pain and swelling after surgery. ?Do not use any lotions or creams on the incision until instructed by your surgeon. ?Total Hip Protocol. ? ?

## 2022-05-22 NOTE — Op Note (Signed)
OPERATIVE REPORT  SURGEON: Rod Can, MD   ASSISTANT: Larene Pickett, PA-C  PREOPERATIVE DIAGNOSIS: Displaced Right femoral neck fracture.   POSTOPERATIVE DIAGNOSIS: Displaced Right femoral neck fracture.   PROCEDURE: Right hip hemiarthroplasty, anterior approach.   IMPLANTS: IMPLANTS: Biomet Taperloc Complete Reduced Distal stem, size 15x150 mm, standard offset, with a 26 -3 mm metal head ball and a 51 mm bipolar articulation.  ANESTHESIA:  MAC and Spinal  ANTIBIOTICS: 2g ancef.  ESTIMATED BLOOD LOSS:-650 mL    DRAINS: None.  COMPLICATIONS: None   CONDITION: PACU - hemodynamically stable.   BRIEF CLINICAL NOTE: Adam Rowland is a 76 y.o. male with a displaced Right femoral neck fracture. The patient was admitted to the hospitalist service and underwent perioperative risk stratification and medical optimization. The risks, benefits, and alternatives to hemiarthroplasty were explained, and the patient elected to proceed.  PROCEDURE IN DETAIL: The patient was taken to the operating room and general anesthesia was induced on the hospital bed.  The patient was then positioned on the Hana table.  All bony prominences were well padded.  The hip was prepped and draped in the normal sterile surgical fashion.  A time-out was called verifying side and site of surgery. Antibiotics were given within 60 minutes of beginning the procedure.   Bikini incision was made, and the direct anterior approach to the hip was performed through the Hueter interval.  Superficial dissection was carried out lateral to the ASIS. Lateral femoral circumflex vessels were treated with the Auqumantys. The anterior capsule was exposed and an inverted T capsulotomy was made. Fracture hematoma was encountered and evacuated. The patient was found to have a comminuted Right subcapital femoral neck fracture.  Inferior pubofemoral ligament was released subperiosteally to the lesser trochanter. I freshened the femoral neck cut  with a saw.  I removed the femoral neck fragment.  A corkscrew was placed into the head and the head was removed.  This was passed to the back table and was measured.   Acetabular exposure was achieved.  I examined the articular cartilage which was intact.  The labrum was intact. A 51 mm trial head was placed and found to have excellent fit.   I then gained femoral exposure taking care to protect the abductors and greater trochanter.  The superior capsule was incised longitudinally, staying lateral to the posterior border of the femoral neck. External rotation, extension, and adduction were applied.  A cookie cutter was used to enter the femoral canal, and then the femoral canal finder was used to confirm location.  I then sequentially broached up to a size 15.  Calcar planer was used on the femoral neck remnant.  I placed a standard offset neck and a trial bipolar articulation. The hip was reduced.  Leg lengths were checked fluoroscopically.  The hip was dislocated and trial components were removed.  I placed the real stem followed by the real spacer and head ball.  A single reduction maneuver was performed and the hip was reduced.  Fluoroscopy was used to confirm component position and leg lengths.  At 90 degrees of external rotation and extension, the hip was stable to an anterior directed force.   The wound was copiously irrigated with Irrisept solution and normal saline using pulse lavage.  Marcaine solution was injected into the periarticular soft tissue.  The wound was closed in layers using #1 V-Loc for the fascia, 2-0 Vicryl for the subcutaneous fat, 2-0 Monocryl for the deep dermal layer, and staples + Dermabond  for the skin.  Once the glue was fully dried, an Aquacell Ag dressing was applied.  The patient was then awakened from anesthesia and transported to the recovery room in stable condition.  Sponge, needle, and instrument counts were correct at the end of the case x2.  The patient tolerated the  procedure well and there were no known complications.  Please note that a surgical assistant was a medical necessity for this procedure to perform it in a safe and expeditious manner. Assistant was necessary to provide appropriate retraction of vital neurovascular structures, to prevent femoral fracture, and to allow for anatomic placement of the prosthesis.

## 2022-05-22 NOTE — Progress Notes (Signed)
Orthopedic Tech Progress Note Patient Details:  Adam Rowland 08-16-1946 [id number]  Patient ID: Adam Rowland, male   DOB: August 20, 1946, 76 y.o.   MRN: [id number] The patient does not meet the requirements for an OHF. No one over 70 is supposed to have one. Karolee Stamps 05/22/2022, 6:46 AM

## 2022-05-22 NOTE — Anesthesia Preprocedure Evaluation (Addendum)
Anesthesia Evaluation  Patient identified by MRN, date of birth, ID band Patient confused    Reviewed: Allergy & Precautions, NPO status , Patient's Chart, lab work & pertinent test results  Airway Mallampati: II  TM Distance: >3 FB Neck ROM: Full    Dental no notable dental hx.    Pulmonary former smoker,    Pulmonary exam normal        Cardiovascular hypertension, Pt. on medications and Pt. on home beta blockers + CAD, + Past MI, + Cardiac Stents and +CHF  Normal cardiovascular exam     Neuro/Psych PSYCHIATRIC DISORDERS Dementia negative neurological ROS     GI/Hepatic negative GI ROS, Neg liver ROS,   Endo/Other  negative endocrine ROS  Renal/GU negative Renal ROS     Musculoskeletal negative musculoskeletal ROS (+)   Abdominal   Peds  Hematology negative hematology ROS (+)   Anesthesia Other Findings Right Femoral neck Fx  Reproductive/Obstetrics                            Anesthesia Physical Anesthesia Plan  ASA: 3  Anesthesia Plan: Spinal   Post-op Pain Management:    Induction:   PONV Risk Score and Plan: 1 and Ondansetron, Dexamethasone, Propofol infusion and Treatment may vary due to age or medical condition  Airway Management Planned: Simple Face Mask  Additional Equipment:   Intra-op Plan:   Post-operative Plan:   Informed Consent: I have reviewed the patients History and Physical, chart, labs and discussed the procedure including the risks, benefits and alternatives for the proposed anesthesia with the patient or authorized representative who has indicated his/her understanding and acceptance.   Patient has DNR.  Discussed DNR with power of attorney and Continue DNR.   Dental advisory given and Consent reviewed with POA  Plan Discussed with: CRNA  Anesthesia Plan Comments:        Anesthesia Quick Evaluation

## 2022-05-22 NOTE — Anesthesia Postprocedure Evaluation (Signed)
Anesthesia Post Note  Patient: Adam Rowland  Procedure(s) Performed: HEMI- ARTHROPLASTY ANTERIOR APPROACH (Right: Hip)     Patient location during evaluation: PACU Anesthesia Type: Spinal Level of consciousness: awake Pain management: pain level controlled Vital Signs Assessment: post-procedure vital signs reviewed and stable Respiratory status: spontaneous breathing, nonlabored ventilation, respiratory function stable and patient connected to nasal cannula oxygen Cardiovascular status: stable and blood pressure returned to baseline Postop Assessment: no apparent nausea or vomiting Anesthetic complications: no   No notable events documented.  Last Vitals:  Vitals:   05/22/22 1500 05/22/22 1603  BP: 98/60 (!) 117/54  Pulse: (!) 55 68  Resp: 13 14  Temp: 36.6 C   SpO2: 100% 98%    Last Pain:  Vitals:   05/22/22 1500  TempSrc: Oral  PainSc: Asleep                 [name] P Jojo Pehl

## 2022-05-22 NOTE — Plan of Care (Signed)
  Problem: Education: Goal: Knowledge of General Education information will improve Description: Including pain rating scale, medication(s)/side effects and non-pharmacologic comfort measures Outcome: Progressing   Problem: Health Behavior/Discharge Planning: Goal: Ability to manage health-related needs will improve Outcome: Progressing   Problem: Clinical Measurements: Goal: Ability to maintain clinical measurements within normal limits will improve Outcome: Progressing   Problem: Activity: Goal: Risk for activity intolerance will decrease Outcome: Progressing   Problem: Coping: Goal: Level of anxiety will decrease Outcome: Progressing   Problem: Skin Integrity: Goal: Risk for impaired skin integrity will decrease Outcome: Progressing   Problem: Elimination: Goal: Will not experience complications related to bowel motility Outcome: Progressing

## 2022-05-23 ENCOUNTER — Encounter (HOSPITAL_COMMUNITY): Payer: Self-pay | Admitting: Orthopedic Surgery

## 2022-05-23 DIAGNOSIS — S72001A Fracture of unspecified part of neck of right femur, initial encounter for closed fracture: Secondary | ICD-10-CM | POA: Diagnosis not present

## 2022-05-23 LAB — CBC
HCT: 34.5 % — ABNORMAL LOW (ref 39.0–52.0)
Hemoglobin: 11.9 g/dL — ABNORMAL LOW (ref 13.0–17.0)
MCH: 33.2 pg (ref 26.0–34.0)
MCHC: 34.5 g/dL (ref 30.0–36.0)
MCV: 96.4 fL (ref 80.0–100.0)
Platelets: 205 10*3/uL (ref 150–400)
RBC: 3.58 MIL/uL — ABNORMAL LOW (ref 4.22–5.81)
RDW: 11.9 % (ref 11.5–15.5)
WBC: 10.2 10*3/uL (ref 4.0–10.5)
nRBC: 0 % (ref 0.0–0.2)

## 2022-05-23 LAB — BASIC METABOLIC PANEL
Anion gap: 9 (ref 5–15)
BUN: 25 mg/dL — ABNORMAL HIGH (ref 8–23)
CO2: 23 mmol/L (ref 22–32)
Calcium: 8.4 mg/dL — ABNORMAL LOW (ref 8.9–10.3)
Chloride: 109 mmol/L (ref 98–111)
Creatinine, Ser: 0.96 mg/dL (ref 0.61–1.24)
GFR, Estimated: >60 mL/min (ref >60)
Glucose, Bld: 183 mg/dL — ABNORMAL HIGH (ref 70–99)
Potassium: 4.8 mmol/L (ref 3.5–5.1)
Sodium: 141 mmol/L (ref 135–145)

## 2022-05-23 MED ORDER — ASPIRIN 81 MG PO CHEW: 81.0000 mg | CHEWABLE_TABLET | Freq: Two times a day (BID) | ORAL | 0 refills | Status: AC

## 2022-05-23 MED ORDER — HYDROCODONE-ACETAMINOPHEN 10-325 MG PO TABS: 0.5000 | ORAL_TABLET | ORAL | 0 refills | Status: AC | PRN

## 2022-05-23 NOTE — Progress Notes (Signed)
PROGRESS NOTE    Adam Rowland  FOY:[id number] DOB: [birth year] DOA: 05/20/2022 PCP: Chevis Pretty, FNP   Brief Narrative:   Adam Rowland is a 76 y.o. male presenting after unwitnessed fall at SNF several days prior to admission with medical history significant of Dementia, atrial fibrillation not on anticoagulation, hypertension, hyperlipidemia and CAD who presents with difficulty weightbearing. Imaging at previous facility was reported negative. Treated for presumed UTI at outside facility. Admitted to hospital service after imaging in our ED confirmed subcapital R femur neck Fx. Ortho consulted at admission.  Assessment & Plan: Principal Problem:   Hip fracture ([place]) Active Problems:   Hyperlipidemia   CAD (coronary artery disease)   Dementia (HCC)   AF (paroxysmal atrial fibrillation) (HCC)   UTI (urinary tract infection)   Hypotension  Hip fracture (HCC) -Acute impacted mildly angulated subcapital right femoral neck fracture, presumed traumatic -Ortho to see in consultation - ORIF 05/22/22 -PRN hydrocodone and IV morphine for pain  Hypotension, chronic -ongoing issue per signout from facilty -IVF in ED with minimal response -Unlikely related to subacute UTI -No leukocytosis and he is already completed antibiotics for UTI -Possibly exacerbated by narcotics - high risk for polypharmacy/orthostatics post operatively   UTI (urinary tract infection) POA, resolved prior to admit UTI dx on 9/18- completed abx therapy at this point UA WNL here - follow cultures for completion   AF (paroxysmal atrial fibrillation) ([place]) Rate controlled.  Not on anticoagulation.   Dementia ([place]) Severe and is completely disoriented at baseline   Hyperlipidemia Continue statin pending med rec.  DVT prophylaxis: Per Ortho Code Status: DNR Family Communication: Wife updated over the phone  Status is: Inpt  Dispo: The patient is from: SNF/memory care unit              Anticipated d/c is  to: TBD - likely back to facility; unclear if skilled care is offered at his memory care unit, may require transition to outside facility prior to disposition back to his memory care unit.              Anticipated d/c date is: 48-72h pending postsurgical course and physical therapy needs              Patient currently NOT medically stable for discharge  Consultants:  Ortho, Dr Marcelino Scot  Procedures:  TBD  Antimicrobials:  Perioperatively per ortho   Subjective: No acute issues/events overnight, pain currently well controlled  Objective: Vitals:   05/22/22 1603 05/22/22 1900 05/22/22 2300 05/23/22 0458  BP: (!) 117/54 (!) 106/52 (!) 91/49 (!) 113/50  Pulse: 68 77 71 67  Resp: 14 16 19 17   Temp:  97.9 F (36.6 C) 98.2 F (36.8 C) 98.2 F (36.8 C)  TempSrc:  Oral Oral Oral  SpO2: 98% 100% 100% 99%    Intake/Output Summary (Last 24 hours) at 05/23/2022 0749 Last data filed at 05/23/2022 0500 Gross per 24 hour  Intake 2050 ml  Output 1250 ml  Net 800 ml   There were no vitals filed for this visit.  Examination:  General:  Pleasantly resting in bed, No acute distress. HEENT:  Normocephalic atraumatic.  Sclerae nonicteric, noninjected.  Extraocular movements intact bilaterally. Neck:  Without mass or deformity.  Trachea is midline. Lungs:  Clear to auscultate bilaterally without rhonchi, wheeze, or rales. Heart:  Regular rate and rhythm.  Without murmurs, rubs, or gallops. Abdomen:  Soft, nontender, nondistended.  Without guarding or rebound. Extremities: Postop bandage clean dry intact Vascular:  Dorsalis pedis and posterior tibial pulses palpable bilaterally. Skin:  Warm and dry, no erythema, no ulcerations.  Data Reviewed: I have personally reviewed following labs and imaging studies  CBC: Recent Labs  Lab 05/17/22 0012 05/21/22 0050 05/22/22 0604 05/23/22 0106  WBC 17.1* 10.4 8.8 10.2  NEUTROABS 15.2* 7.1  --   --   HGB 14.4 14.1 12.0* 11.9*  HCT 42.8 43.4 35.2*  34.5*  MCV 98.2 102.1* 97.5 96.4  PLT 218 219 201 205    Basic Metabolic Panel: Recent Labs  Lab 05/17/22 0012 05/21/22 0050 05/23/22 0106  NA 138 145 141  K 4.4 4.1 4.8  CL 107 115* 109  CO2 22 22 23   GLUCOSE 148* 100* 183*  BUN 23 28* 25*  CREATININE 1.09 0.89 0.96  CALCIUM 9.0 8.6* 8.4*    GFR: Estimated Creatinine Clearance: 66.5 mL/min (by C-G formula based on SCr of 0.96 mg/dL). Liver Function Tests: Recent Labs  Lab 05/21/22 0050  AST 51*  ALT 37  ALKPHOS 53  BILITOT 0.7  PROT 5.8*  ALBUMIN 3.0*    No results for input(s): "LIPASE", "AMYLASE" in the last 168 hours. No results for input(s): "AMMONIA" in the last 168 hours. Coagulation Profile: No results for input(s): "INR", "PROTIME" in the last 168 hours. Cardiac Enzymes: Recent Labs  Lab 05/17/22 0012  CKTOTAL 57    BNP (last 3 results) No results for input(s): "PROBNP" in the last 8760 hours. HbA1C: No results for input(s): "HGBA1C" in the last 72 hours. CBG: No results for input(s): "GLUCAP" in the last 168 hours. Lipid Profile: No results for input(s): "CHOL", "HDL", "LDLCALC", "TRIG", "CHOLHDL", "LDLDIRECT" in the last 72 hours. Thyroid Function Tests: No results for input(s): "TSH", "T4TOTAL", "FREET4", "T3FREE", "THYROIDAB" in the last 72 hours. Anemia Panel: No results for input(s): "VITAMINB12", "FOLATE", "FERRITIN", "TIBC", "IRON", "RETICCTPCT" in the last 72 hours. Sepsis Labs: No results for input(s): "PROCALCITON", "LATICACIDVEN" in the last 168 hours.  Recent Results (from the past 240 hour(s))  MRSA Next Gen by PCR, Nasal     Status: Abnormal   Collection Time: 05/21/22  4:54 PM   Specimen: Nasal Mucosa; Nasal Swab  Result Value Ref Range Status   MRSA by PCR Next Gen DETECTED (A) NOT DETECTED Final    Comment: CRITICAL RESULT CALLED TO, READ BACK BY AND VERIFIED WITH:  C/ ERIC N., RN 05/22/22 0005 A. LAFRANCE (NOTE) The GeneXpert MRSA Assay (FDA approved for NASAL specimens  only), is one component of a comprehensive MRSA colonization surveillance program. It is not intended to diagnose MRSA infection nor to guide or monitor treatment for MRSA infections. Test performance is not FDA approved in patients less than 54 years old. Performed at [facility], [address], [place], [place]          Radiology Studies: DG HIP UNILAT WITH PELVIS 2-3 VIEWS RIGHT  Result Date: 05/22/2022 CLINICAL DATA:  Right hip surgery EXAM: DG HIP (WITH OR WITHOUT PELVIS) 2-3V RIGHT; DG C-ARM 1-60 MIN-NO REPORT COMPARISON:  05/20/2022 FINDINGS: Four C-arm fluoroscopic images were obtained intraoperatively and submitted for post operative interpretation. Images obtained during placement of right hip arthroplasty hardware. 3 seconds fluoroscopy time utilized. Radiation dose: 0.36 mGy. Please see the performing provider's procedural report for further detail. IMPRESSION: Intraoperative fluoroscopy, as above. Electronically Signed   By: 05/22/2022 D.O.   On: 05/22/2022 14:40   DG HIP UNILAT WITH PELVIS 2-3 VIEWS RIGHT  Result Date: 05/22/2022 CLINICAL  DATA:  Right hip surgery EXAM: DG HIP (WITH OR WITHOUT PELVIS) 2-3V RIGHT COMPARISON:  05/20/2022 FINDINGS: Interval postsurgical changes from right hip hemiarthroplasty. Arthroplasty components are in their expected alignment. No periprosthetic fracture or evidence of other complication. Expected postoperative changes within the overlying soft tissues. Advanced atherosclerotic vascular calcifications. IMPRESSION: Interval right hip hemiarthroplasty without evidence of complication. Electronically Signed   By: Duanne Guess D.O.   On: 05/22/2022 14:39   DG C-Arm 1-60 Min-No Report  Result Date: 05/22/2022 Fluoroscopy was utilized by the requesting physician.  No radiographic interpretation.   DG Elbow 2 Views Left  Result Date: 05/21/2022 CLINICAL DATA:  Left elbow swelling. EXAM: LEFT ELBOW - 2 VIEW COMPARISON:   None Available. FINDINGS: There is no evidence of fracture, dislocation, or joint effusion. There is no evidence of arthropathy or other focal bone abnormality. Soft tissues are unremarkable. IMPRESSION: Negative. Electronically Signed   By: Ted Mcalpine M.D.   On: 05/21/2022 17:42   CT HIP RIGHT WO CONTRAST  Result Date: 05/21/2022 CLINICAL DATA:  Right hip fracture EXAM: CT OF THE RIGHT HIP WITHOUT CONTRAST TECHNIQUE: Multidetector CT imaging of the right hip was performed according to the standard protocol. Multiplanar CT image reconstructions were also generated. RADIATION DOSE REDUCTION: This exam was performed according to the departmental dose-optimization program which includes automated exposure control, adjustment of the mA and/or kV according to patient size and/or use of iterative reconstruction technique. COMPARISON:  X-ray 05/20/2022 FINDINGS: Bones/Joint/Cartilage Acute right femoral neck fracture, predominantly subcapital in location. Fracture is moderately impacted with shortening. Extremities slightly externally rotated. No fracture involvement of the intertrochanteric region. Hip joint intact without dislocation. Moderate joint space narrowing of the right hip. The included portion of the right hemipelvis is intact without fracture or diastasis. Ligaments Suboptimally assessed by CT. Muscles and Tendons No acute musculotendinous abnormality by CT. Soft tissues Mild soft tissue edema at the lateral aspect of the hip. No organized hematoma. No right inguinal lymphadenopathy. Atherosclerotic vascular calcifications. IMPRESSION: Acute moderately impacted right femoral neck fracture, as described above. Electronically Signed   By: Duanne Guess D.O.   On: 05/21/2022 15:11    Scheduled Meds:  aspirin EC  325 mg Oral Q breakfast   docusate sodium  100 mg Oral BID   memantine  10 mg Oral BID   mupirocin ointment  1 Application Nasal BID   senna  1 tablet Oral BID   Continuous  Infusions:  lactated ringers 50 mL/hr at 05/22/22 1535     LOS: 2 days   Time spent:  Azucena Fallen, DO [facility]  If 7PM-7AM, please contact night-coverage [email or web address]  05/23/2022, 7:49 AM

## 2022-05-23 NOTE — Plan of Care (Signed)

## 2022-05-23 NOTE — Evaluation (Signed)
Clinical/Bedside Swallow Evaluation Patient Details  Name: Adam Rowland MRN: [id number] Date of Birth: August 31, 1945  Today's Date: 05/23/2022 Time: SLP Start Time (ACUTE ONLY): 1111 SLP Stop Time (ACUTE ONLY): 1128 SLP Time Calculation (min) (ACUTE ONLY): 17 min  Past Medical History:  Past Medical History:  Diagnosis Date   Acute metabolic encephalopathy    CAD (coronary artery disease)    a. 05/2014 NSTEMI/PCI: LM nl, LAD 80m (3.0x23 Xience Alpine DES), D1 90, LCX 42m, OM1 80, RCA 30p/d, RPDA 40-50p, EF 40-45% ->Plan for staged PCI of LCX/OM1.   Cardiomyopathy, ischemic    a. 05/2014 Echo: Ef 45-50%, mid-apicalanteroseptal AK, Gr 2 DD.   Cataract    Dementia (HCC)    Fracture, ankle 06/12/2014   Hyperlipidemia    Hypertension    Myocardial infarction [facility]) 2015   non STEMI   Nephrolithiasis 01/16/2013   NSTEMI (non-ST elevated myocardial infarction) (HCC)    NSVT (nonsustained ventricular tachycardia) (HCC)    a. 05/2014 in setting of NSTEMI -  asymptomatic.   Past Surgical History:  Past Surgical History:  Procedure Laterality Date   ANKLE FRACTURE SURGERY     CARDIAC CATHETERIZATION     CARDIAC CATHETERIZATION Right 06/20/2014   Procedure: CORONARY STENT INTERVENTION;  Surgeon: Peter M Swaziland, MD;  Location: [facility] CATH LAB;  Service: Cardiovascular;  Laterality: Right;   CATARACT EXTRACTION Bilateral    CORONARY STENT PLACEMENT     LEFT HEART CATHETERIZATION WITH CORONARY ANGIOGRAM N/A 06/20/2014   Procedure: LEFT HEART CATHETERIZATION WITH CORONARY ANGIOGRAM;  Surgeon: Peter M Swaziland, MD;  Location: [facility] CATH LAB;  Service: Cardiovascular;  Laterality: N/A;   NASAL SEPTUM SURGERY     VASECTOMY     HPI:  Adam Rowland is a 76 y.o. male presenting after unwitnessed fall at SNF several days prior to admission with who presented with difficulty weightbearing. Pt now s/p right hip hemiarthroplasty. Pt  medical history significant of Dementia, atrial fibrillation not on  anticoagulation, hypertension, hyperlipidemia and CAD    Assessment / Plan / Recommendation  Clinical Impression  Pt presents with grossly normal swallow function as assessed clinically.  Pt tolerated all consistencies trialed with no clinical s/s of aspiration and exhibited good oral clearance of regular solids.  Pt with low vocal intensity, but this typical for him per family.  Family reports he at breakfast without difficulty.  CXR with out any active disease.   Recommend continuing regular texture diet with thin liquid.   Pt has no further ST needs.  SLP will sign off at this time.   SLP Visit Diagnosis: Dysphagia, unspecified (R13.10)    Aspiration Risk  No limitations    Diet Recommendation Regular;Thin liquid   Liquid Administration via: Cup;Straw Medication Administration: Whole meds with liquid Supervision: Staff to assist with self feeding;Full supervision/cueing for compensatory strategies Compensations: Slow rate;Small sips/bites Postural Changes: Seated upright at 90 degrees    Other  Recommendations Oral Care Recommendations: Oral care BID    Recommendations for follow up therapy are one component of a multi-disciplinary discharge planning process, led by the attending physician.  Recommendations may be updated based on patient status, additional functional criteria and insurance authorization.  Follow up Recommendations No SLP follow up      Assistance Recommended at Discharge None  Functional Status Assessment Patient has not had a recent decline in their functional status  Frequency and Duration  (N/A)          Prognosis Prognosis for Safe Diet  Advancement:  (N/A)      Swallow Study   General Date of Onset: 05/20/22 HPI: Adam Rowland is a 76 y.o. male presenting after unwitnessed fall at SNF several days prior to admission with who presented with difficulty weightbearing. Pt now s/p right hip hemiarthroplasty. Pt  medical history significant of Dementia,  atrial fibrillation not on anticoagulation, hypertension, hyperlipidemia and CAD Type of Study: Bedside Swallow Evaluation Diet Prior to this Study: Regular;Thin liquids Temperature Spikes Noted: No Behavior/Cognition: Cooperative;Requires cueing;Alert Oral Cavity Assessment: Within Functional Limits Oral Care Completed by SLP: No Oral Cavity - Dentition: Dentures, top (partial) Self-Feeding Abilities: Needs assist Patient Positioning: Upright in bed Baseline Vocal Quality: Low vocal intensity Volitional Cough: Cognitively unable to elicit Volitional Swallow: Unable to elicit    Oral/Motor/Sensory Function Overall Oral Motor/Sensory Function: Mild impairment Facial Symmetry: Within Functional Limits Lingual ROM:  (Could not test) Lingual Symmetry: Within Functional Limits Lingual Strength:  (Could not test) Velum:  (Could not test) Mandible:  (?reduced)   Ice Chips Ice chips: Not tested   Thin Liquid Thin Liquid: Within functional limits Presentation: Straw    Nectar Thick Nectar Thick Liquid: Not tested   Honey Thick Honey Thick Liquid: Not tested   Puree Puree: Within functional limits Presentation: Spoon   Solid     Solid: Within functional limits Presentation:  (SLP fed)      Celedonio Savage, [place], [place] Office: [phone] 05/23/2022,11:33 AM

## 2022-05-23 NOTE — TOC Initial Note (Signed)
Transition of Care [facility]) - Initial/Assessment Note    Patient Details  Name: Adam Rowland MRN: [id number] Date of Birth: Sep 23, 1945  Transition of Care [facility]) CM/SW Contact:    Joanne Chars, LCSW Phone Number: 05/23/2022, 3:49 PM  Clinical Narrative:         Pt oriented x1, CSW spoke with pt wife [name], who was in room.  She confirmed pt is in memory care unit at [facility].  Discussed PT recommendation for SNF.  Wife would like to pursue whether pt can return to [facility] and do PT there, if this is not possible, she is open to SNF.  They live in [place], she would want [place] facility or [facility] or a SNF close to the hospital in [place].  Choice documents for both areas provided.  1530: CSW called [facility], was directed to [employer], from memory care [facility].  CSW unable to speak with her and left message.        Expected Discharge Plan: Skilled Nursing Facility Barriers to Discharge: Continued Medical Work up, Other (must enter comment) (checking if pt can do PT at memory care rather than SNF)   Patient Goals and CMS Choice   CMS Medicare.gov Compare Post Acute Care list provided to:: Patient Represenative (must comment) (wife) Choice offered to / list presented to : Spouse  Expected Discharge Plan and Services Expected Discharge Plan: [place] In-house Referral: Clinical Social Work   Post Acute Care Choice:  (TBD: memory care vs SNF) Living arrangements for the past 2 months: [place] ([facility])                                      Prior Living Arrangements/Services Living arrangements for the past 2 months: [place] ([facility]) Lives with:: Facility Resident Patient language and need for interpreter reviewed:: No        Need for Family Participation in Patient Care: Yes (Comment) Care giver support system in place?: Yes (comment) Current home services: Other (comment)  (na) Criminal Activity/Legal Involvement Pertinent to Current Situation/Hospitalization: No - Comment as needed  Activities of Daily Living      Permission Sought/Granted                  Emotional Assessment Appearance:: Appears stated age Attitude/Demeanor/Rapport: Unable to Assess Affect (typically observed): Unable to Assess Orientation: : Oriented to Self Alcohol / Substance Use: Not Applicable Psych Involvement: No (comment)  Admission diagnosis:  Hip fracture ([place]) [S72.009A] Right leg pain [M79.604] Fall, initial encounter [W19.XXXA] Patient Active Problem List   Diagnosis Date Noted   Hip fracture ([place]) 05/21/2022   UTI (urinary tract infection) 05/21/2022   Hypotension 05/21/2022   Administrative encounter 27/10/5007   Acute metabolic encephalopathy 38/18/2993   AF (paroxysmal atrial fibrillation) ([place]) 01/30/2021   Acute prostatitis 01/28/2021   Dementia ([place]) 71/69/6789   Metabolic syndrome 38/05/1750   BMI 27.0-27.9,adult 05/21/2015   CAD (coronary artery disease) 06/21/2014   Cardiomyopathy, ischemic 06/21/2014   History of non-ST elevation myocardial infarction (NSTEMI) 06/19/2014   Hypertension 01/16/2014   Hyperlipidemia 01/16/2013   PCP:  Chevis Pretty, FNP Pharmacy:  No Pharmacies Listed    Social Determinants of Health (SDOH) Interventions    Readmission Risk Interventions     No data to display

## 2022-05-23 NOTE — NC FL2 (Signed)
[place] MEDICAID FL2 LEVEL OF CARE SCREENING TOOL     IDENTIFICATION  Patient Name: Adam Rowland Birthdate: 1945-12-28 Sex: male Admission Date (Current Location): 05/20/2022  [facility] and [place] Number:  Producer, television/film/video and Address:  [facility] [place], [address], [place], [place] [address]      Provider Number: [id number]  Attending Physician Name and Address:  Azucena Fallen, MD  Relative Name and Phone Number:  Decarlos, Empey [phone]  [phone]    Current Level of Care: Hospital Recommended Level of Care: Skilled Nursing Facility Prior Approval Number:    Date Approved/Denied:   PASRR Number: [id number]  Discharge Plan: SNF    Current Diagnoses: Patient Active Problem List   Diagnosis Date Noted   Hip fracture (HCC) 05/21/2022   UTI (urinary tract infection) 05/21/2022   Hypotension 05/21/2022   Administrative encounter 10/19/2021   Acute metabolic encephalopathy 01/31/2021   AF (paroxysmal atrial fibrillation) (HCC) 01/30/2021   Acute prostatitis 01/28/2021   Dementia (HCC) 06/06/2018   Metabolic syndrome 05/27/2015   BMI 27.0-27.9,adult 05/21/2015   CAD (coronary artery disease) 06/21/2014   Cardiomyopathy, ischemic 06/21/2014   History of non-ST elevation myocardial infarction (NSTEMI) 06/19/2014   Hypertension 01/16/2014   Hyperlipidemia 01/16/2013    Orientation RESPIRATION BLADDER Height & Weight     Self  Normal Incontinent, Indwelling catheter Weight:   Height:     BEHAVIORAL SYMPTOMS/MOOD NEUROLOGICAL BOWEL NUTRITION STATUS      Continent Diet (see discharge summary)  AMBULATORY STATUS COMMUNICATION OF NEEDS Skin   Total Care Verbally Surgical wounds                       Personal Care Assistance Level of Assistance  Bathing, Feeding, Dressing Bathing Assistance: Maximum assistance Feeding assistance: Maximum assistance Dressing Assistance: Maximum assistance     Functional  Limitations Info  Sight, Hearing, Speech Sight Info: Adequate Hearing Info: Impaired Speech Info: Adequate    SPECIAL CARE FACTORS FREQUENCY  PT (By licensed PT), OT (By licensed OT)     PT Frequency: 5x week OT Frequency: 5x week            Contractures Contractures Info: Not present    Additional Factors Info  Code Status, Allergies Code Status Info: DNR Allergies Info: NKA           Current Medications (05/23/2022):  This is the current hospital active medication list Current Facility-Administered Medications  Medication Dose Route Frequency Provider Last Rate Last Admin   aspirin EC tablet 325 mg  325 mg Oral Q breakfast Swinteck, Arlys John, MD   325 mg at 05/23/22 0845   bisacodyl (DULCOLAX) EC tablet 5 mg  5 mg Oral Daily PRN Swinteck, Arlys John, MD       docusate sodium (COLACE) capsule 100 mg  100 mg Oral BID Samson Frederic, MD   100 mg at 05/23/22 0841   HYDROcodone-acetaminophen (NORCO/VICODIN) 5-325 MG per tablet 1-2 tablet  1-2 tablet Oral Q6H PRN Samson Frederic, MD   1 tablet at 05/23/22 5361   lactated ringers infusion   Intravenous Continuous Samson Frederic, MD 50 mL/hr at 05/22/22 1535 New Bag at 05/22/22 1535   memantine (NAMENDA) tablet 10 mg  10 mg Oral BID Samson Frederic, MD   10 mg at 05/23/22 0846   menthol-cetylpyridinium (CEPACOL) lozenge 3 mg  1 lozenge Oral PRN Samson Frederic, MD       Or   phenol (CHLORASEPTIC)  mouth spray 1 spray  1 spray Mouth/Throat PRN Swinteck, Aaron Edelman, MD       metoCLOPramide (REGLAN) tablet 5-10 mg  5-10 mg Oral Q8H PRN Swinteck, Aaron Edelman, MD       Or   metoCLOPramide (REGLAN) injection 5-10 mg  5-10 mg Intravenous Q8H PRN Swinteck, Aaron Edelman, MD       morphine (PF) 2 MG/ML injection 0.5 mg  0.5 mg Intravenous Q2H PRN Swinteck, Aaron Edelman, MD       mupirocin ointment (BACTROBAN) 2 % 1 Application  1 Application Nasal BID Rod Can, MD   1 Application at 20/94/70 0851   ondansetron (ZOFRAN) tablet 4 mg  4 mg Oral Q6H PRN Swinteck,  Aaron Edelman, MD       Or   ondansetron (ZOFRAN) injection 4 mg  4 mg Intravenous Q6H PRN Swinteck, Aaron Edelman, MD       polyethylene glycol (MIRALAX / GLYCOLAX) packet 17 g  17 g Oral Daily PRN Swinteck, Aaron Edelman, MD       senna (SENOKOT) tablet 8.6 mg  1 tablet Oral BID Rod Can, MD   8.6 mg at 05/23/22 0842   sodium phosphate (FLEET) 7-19 GM/118ML enema 1 enema  1 enema Rectal Once PRN Rod Can, MD         Discharge Medications: Please see discharge summary for a list of discharge medications.  Relevant Imaging Results:  Relevant Lab Results:   Additional Information SSN: [id number].  Pt is vaccinated for covid with one booster.  Joanne Chars, LCSW

## 2022-05-23 NOTE — Progress Notes (Signed)
    Subjective:  Patient has dementia at baseline. He is pleasantly confused this morning. No reported events overnight. Unable to fully assess due to mental status.    Objective:   VITALS:   Vitals:   05/22/22 1603 05/22/22 1900 05/22/22 2300 05/23/22 0458  BP: (!) 117/54 (!) 106/52 (!) 91/49 (!) 113/50  Pulse: 68 77 71 67  Resp: $Remo'14 16 19 17  'iDdVM$ Temp:  97.9 F (36.6 C) 98.2 F (36.8 C) 98.2 F (36.8 C)  TempSrc:  Oral Oral Oral  SpO2: 98% 100% 100% 99%    Patient lying in bed. NAD. He is oriented to his name only.  ABD soft Neurovascular intact Intact pulses distally Dorsiflexion/Plantar flexion intact Incision: dressing C/D/I No cellulitis present Compartment soft Unable to fully assess sensation due to dementia.   Lab Results  Component Value Date   WBC 10.2 05/23/2022   HGB 11.9 (L) 05/23/2022   HCT 34.5 (L) 05/23/2022   MCV 96.4 05/23/2022   PLT 205 05/23/2022   BMET    Component Value Date/Time   NA 141 05/23/2022 0106   NA 141 01/28/2021 0923   K 4.8 05/23/2022 0106   CL 109 05/23/2022 0106   CO2 23 05/23/2022 0106   GLUCOSE 183 (H) 05/23/2022 0106   BUN 25 (H) 05/23/2022 0106   BUN 15 01/28/2021 0923   CREATININE 0.96 05/23/2022 0106   CREATININE 0.86 01/16/2013 0845   CALCIUM 8.4 (L) 05/23/2022 0106   EGFR 82 01/28/2021 0923   GFRNONAA >60 05/23/2022 0106   GFRNONAA >89 01/16/2013 0845     Assessment/Plan: 1 Day Post-Op   Principal Problem:   Hip fracture (HCC) Active Problems:   Hyperlipidemia   CAD (coronary artery disease)   Dementia (HCC)   AF (paroxysmal atrial fibrillation) (HCC)   UTI (urinary tract infection)   Hypotension   WBAT with walker DVT ppx: Aspirin, SCDs, TEDS PO pain control PT/OT: PT has not seen yet. PT to come by today.  Dispo: Patient under care of the medical team, disposition per their recommendation. Pain medication and DVT ppx printed in chart.    Charlott Rakes, PA-C 05/23/2022, 8:05 AM   [facility]   [place] [address], [address], [place],  [address] Phone: [phone] [email or web address] Facebook  [employer]

## 2022-05-23 NOTE — Evaluation (Signed)
Physical Therapy Evaluation Patient Details Name: Adam Rowland MRN: [id number] DOB: Nov 11, 1945 Today's Date: 05/23/2022  History of Present Illness  Pt is a 76 y.o. M who presents 05/20/2022 after a fall about a week ago. Found to have displaced R femoral neck fx now s/p hemiarthroplasty, direct anterior approach. Significant PMH: dementia, atrial fibrillation, CAD.  Clinical Impression  PTA, pt resides at [facility] memory care facility and is ambulatory per chart review. Pt presents with decreased functional mobility secondary to pain, impaired cognition (which is baseline), poor sitting balance. Pt requiring two person max-total assist for bed mobility and partial standing from edge of bed. Max cues and set up provided for self feeding with OT. Recommend d/c to SNF in order to address deficits and maximize functional mobility.       Recommendations for follow up therapy are one component of a multi-disciplinary discharge planning process, led by the attending physician.  Recommendations may be updated based on patient status, additional functional criteria and insurance authorization.  Follow Up Recommendations Skilled nursing-short term rehab (<3 hours/day) Can patient physically be transported by private vehicle: No    Assistance Recommended at Discharge Frequent or constant Supervision/Assistance  Patient can return home with the following  Two people to help with walking and/or transfers;Two people to help with bathing/dressing/bathroom    Equipment Recommendations Other (comment) (defer)  Recommendations for Other Services       Functional Status Assessment Patient has had a recent decline in their functional status and demonstrates the ability to make significant improvements in function in a reasonable and predictable amount of time.     Precautions / Restrictions Precautions Precautions: Fall Restrictions Weight Bearing Restrictions: Yes RLE Weight Bearing: Weight bearing  as tolerated      Mobility  Bed Mobility Overal bed mobility: Needs Assistance Bed Mobility: Rolling, Sit to Supine, Supine to Sit Rolling: Max assist, +2 for physical assistance   Supine to sit: +2 for physical assistance, Total assist Sit to supine: +2 for physical assistance, Total assist   General bed mobility comments: Pt with no initiation noted, helicopter method with bed pad to exit towards right side of bed. Rolling to straighten out bed pad upon return to supine with maxA + 2    Transfers Overall transfer level: Needs assistance Equipment used: None Transfers: Sit to/from Stand Sit to Stand: Max assist, +2 physical assistance           General transfer comment: Pt performing partial standing from edge of bed with maxA + 2, decreased WB through RLE and left lateral lean    Ambulation/Gait               General Gait Details: unable  Stairs            Wheelchair Mobility    Modified Rankin (Stroke Patients Only)       Balance Overall balance assessment: Needs assistance Sitting-balance support: Feet unsupported Sitting balance-Leahy Scale: Poor Sitting balance - Comments: requiring up to maxA; heavy left lateral lean, guarding away from R side   Standing balance support: Bilateral upper extremity supported Standing balance-Leahy Scale: Zero                               Pertinent Vitals/Pain Pain Assessment Pain Assessment: PAINAD Breathing: normal Negative Vocalization: occasional moan/groan, low speech, negative/disapproving quality Facial Expression: facial grimacing Body Language: rigid, fists clenched, knees up, pushing/pulling away, strikes out  Consolability: distracted or reassured by voice/touch PAINAD Score: 6 Pain Intervention(s): Limited activity within patient's tolerance, Monitored during session, Ice applied, RN gave pain meds during session    Home Living Family/patient expects to be discharged to:: Skilled  nursing facility                   Additional Comments: from [facility]    Prior Function Prior Level of Function : Needs assist             Mobility Comments: ambulatory per chart review       Hand Dominance        Extremity/Trunk Assessment   Upper Extremity Assessment Upper Extremity Assessment: Defer to OT evaluation    Lower Extremity Assessment Lower Extremity Assessment: Difficult to assess due to impaired cognition;Generalized weakness    Cervical / Trunk Assessment Cervical / Trunk Assessment: Kyphotic  Communication   Communication: No difficulties  Cognition Arousal/Alertness: Awake/alert Behavior During Therapy: WFL for tasks assessed/performed Overall Cognitive Status: History of cognitive impairments - at baseline                                 General Comments: Hx dementia; pt able to state full name and day/month of birthday with prompting. Pt pleasantly confused, follows 1 step commands intermittently.        General Comments      Exercises     Assessment/Plan    PT Assessment Patient needs continued PT services  PT Problem List Decreased strength;Decreased activity tolerance;Decreased balance;Decreased mobility;Decreased range of motion;Decreased cognition;Pain       PT Treatment Interventions DME instruction;Gait training;Functional mobility training;Therapeutic activities;Therapeutic exercise;Balance training;Patient/family education    PT Goals (Current goals can be found in the Care Plan section)  Acute Rehab PT Goals Patient Stated Goal: did not state PT Goal Formulation: Patient unable to participate in goal setting Time For Goal Achievement: 06/06/22 Potential to Achieve Goals: Fair    Frequency Min 3X/week     Co-evaluation PT/OT/SLP Co-Evaluation/Treatment: Yes Reason for Co-Treatment: For patient/therapist safety;To address functional/ADL transfers;Necessary to address  cognition/behavior during functional activity PT goals addressed during session: Mobility/safety with mobility         AM-PAC PT "6 Clicks" Mobility  Outcome Measure Help needed turning from your back to your side while in a flat bed without using bedrails?: A Lot Help needed moving from lying on your back to sitting on the side of a flat bed without using bedrails?: Total Help needed moving to and from a bed to a chair (including a wheelchair)?: Total Help needed standing up from a chair using your arms (e.g., wheelchair or bedside chair)?: Total Help needed to walk in hospital room?: Total Help needed climbing 3-5 steps with a railing? : Total 6 Click Score: 7    End of Session Equipment Utilized During Treatment: Gait belt Activity Tolerance: Patient limited by pain Patient left: in bed;with call bell/phone within reach;with bed alarm set Nurse Communication: Mobility status PT Visit Diagnosis: Other abnormalities of gait and mobility (R26.89);Pain Pain - Right/Left: Right Pain - part of body: Hip    Time: 7371-0626 PT Time Calculation (min) (ACUTE ONLY): 28 min   Charges:   PT Evaluation $PT Eval Moderate Complexity: 1 Mod          Lillia Pauls, PT, DPT Acute Rehabilitation Services Office [phone]   Norval Morton 05/23/2022, 8:58 AM

## 2022-05-23 NOTE — Evaluation (Signed)
Occupational Therapy Evaluation Patient Details Name: Adam Rowland MRN: [id number] DOB: [birth year] Today's Date: 05/23/2022   History of Present Illness Pt is a 76 y.o. M who presents 05/20/2022 after a fall about a week ago. Found to have displaced R femoral neck fx now s/p hemiarthroplasty, direct anterior approach. Significant PMH: dementia, atrial fibrillation, CAD.   Clinical Impression   PTA, pt was living at [facility] memory care and received assistance for ADLs; information from chart review. Pt currently requiring Max A +2 for ADLs, bed mobility, and sit<>stand transfer. Pt participating in self feeding task both while seated EOB (with trunk support) and supine with bed in chair position. Pt requiring Max cues for sequencing feeding. Notified RN that pt's bed was wet despite having foley cath in place. Pt would benefit from further acute OT to facilitate safe dc. Recommend dc to SNF for further OT to optimize safety, independence with ADLs, and return to PLOF.      Recommendations for follow up therapy are one component of a multi-disciplinary discharge planning process, led by the attending physician.  Recommendations may be updated based on patient status, additional functional criteria and insurance authorization.   Follow Up Recommendations  Skilled nursing-short term rehab (<3 hours/day)    Assistance Recommended at Discharge Frequent or constant Supervision/Assistance  Patient can return home with the following  (Total A)    Functional Status Assessment  Patient has had a recent decline in their functional status and demonstrates the ability to make significant improvements in function in a reasonable and predictable amount of time.  Equipment Recommendations  Other (comment) (Defer to next venue)    Recommendations for Other Services       Precautions / Restrictions Precautions Precautions: Fall Restrictions Weight Bearing Restrictions: Yes RLE Weight Bearing:  Weight bearing as tolerated      Mobility Bed Mobility Overal bed mobility: Needs Assistance Bed Mobility: Rolling, Sit to Supine, Supine to Sit Rolling: Max assist, +2 for physical assistance   Supine to sit: +2 for physical assistance, Total assist Sit to supine: +2 for physical assistance, Total assist   General bed mobility comments: Pt with no initiation noted, helicopter method with bed pad to exit towards right side of bed. Rolling to straighten out bed pad upon return to supine with maxA + 2    Transfers Overall transfer level: Needs assistance Equipment used: None Transfers: Sit to/from Stand Sit to Stand: Max assist, +2 physical assistance           General transfer comment: Pt performing partial standing from edge of bed with maxA + 2, decreased WB through RLE and left lateral lean      Balance Overall balance assessment: Needs assistance Sitting-balance support: Feet unsupported Sitting balance-Leahy Scale: Poor Sitting balance - Comments: requiring up to maxA; heavy left lateral lean, guarding away from R side   Standing balance support: Bilateral upper extremity supported Standing balance-Leahy Scale: Zero                             ADL either performed or assessed with clinical judgement   ADL Overall ADL's : Needs assistance/impaired                                       General ADL Comments: Currently requiring Max cues throughout all ADLs and due to  decrease cognition and limited attention with pain during mobility - pt requiring max A for ADLs.     Vision         Perception     Praxis      Pertinent Vitals/Pain Pain Assessment Pain Assessment: PAINAD Breathing: normal Negative Vocalization: occasional moan/groan, low speech, negative/disapproving quality Facial Expression: facial grimacing Body Language: rigid, fists clenched, knees up, pushing/pulling away, strikes out Consolability: distracted or reassured  by voice/touch PAINAD Score: 6 Pain Intervention(s): Limited activity within patient's tolerance     Hand Dominance Right   Extremity/Trunk Assessment Upper Extremity Assessment Upper Extremity Assessment: Generalized weakness;Difficult to assess due to impaired cognition   Lower Extremity Assessment Lower Extremity Assessment: Defer to PT evaluation   Cervical / Trunk Assessment Cervical / Trunk Assessment: Kyphotic   Communication Communication Communication: No difficulties   Cognition Arousal/Alertness: Awake/alert Behavior During Therapy: WFL for tasks assessed/performed Overall Cognitive Status: History of cognitive impairments - at baseline                                 General Comments: Hx dementia; pt able to state full name and day/month of birthday with prompting. Pt pleasantly confused, follows 1 step commands intermittently.     General Comments  Despite cath, pt's bed wet with urine. Notified RN    Exercises     Shoulder Instructions      Home Living Family/patient expects to be discharged to:: Skilled nursing facility                                 Additional Comments: from [facility] memory [facility]      Prior Functioning/Environment Prior Level of Function : Needs assist             Mobility Comments: ambulatory per chart review          OT Problem List: Decreased strength;Decreased range of motion;Decreased activity tolerance;Impaired balance (sitting and/or standing);Decreased cognition;Decreased safety awareness;Decreased knowledge of use of DME or AE;Decreased knowledge of precautions      OT Treatment/Interventions: Self-care/ADL training;Therapeutic exercise;Energy conservation;DME and/or AE instruction;Therapeutic activities;Patient/family education    OT Goals(Current goals can be found in the care plan section) Acute Rehab OT Goals Patient Stated Goal: Unstated OT Goal Formulation: Patient unable to  participate in goal setting Time For Goal Achievement: 06/06/22 Potential to Achieve Goals: Good  OT Frequency: Min 2X/week    Co-evaluation PT/OT/SLP Co-Evaluation/Treatment: Yes Reason for Co-Treatment: For patient/therapist safety;To address functional/ADL transfers PT goals addressed during session: Mobility/safety with mobility OT goals addressed during session: ADL's and self-care      AM-PAC OT "6 Clicks" Daily Activity     Outcome Measure Help from another person eating meals?: A Lot Help from another person taking care of personal grooming?: A Lot Help from another person toileting, which includes using toliet, bedpan, or urinal?: Total Help from another person bathing (including washing, rinsing, drying)?: A Lot Help from another person to put on and taking off regular upper body clothing?: A Lot Help from another person to put on and taking off regular lower body clothing?: Total 6 Click Score: 10   End of Session Equipment Utilized During Treatment: Gait belt Nurse Communication: Mobility status  Activity Tolerance: Patient limited by pain Patient left: in bed;with call bell/phone within reach;with nursing/sitter in room  OT Visit Diagnosis: Other  abnormalities of gait and mobility (R26.89);Unsteadiness on feet (R26.81);Muscle weakness (generalized) (M62.81);Pain Pain - Right/Left: Right Pain - part of body: Hip                Time: 0071-2197 OT Time Calculation (min): 26 min Charges:  OT General Charges $OT Visit: 1 Visit OT Evaluation $OT Eval Moderate Complexity: 1 Mod  Riva Sesma MSOT, OTR/L Acute Rehab Office: [phone]  Theodoro Grist Ardath Lepak 05/23/2022, 10:09 AM

## 2022-05-24 DIAGNOSIS — S72001A Fracture of unspecified part of neck of right femur, initial encounter for closed fracture: Secondary | ICD-10-CM | POA: Diagnosis not present

## 2022-05-24 NOTE — TOC Progression Note (Addendum)
Transition of Care [facility]) - Progression Note    Patient Details  Name: Adam Rowland MRN: [id number] Date of Birth: 01-08-1946  Transition of Care [facility]) CM/SW Contact  Joanne Chars, LCSW Phone Number: 05/24/2022, 11:42 AM  Clinical Narrative:   CSW spoke with Pam/[facility] memory [facility], she requested PT notes to evaluate if pt can return to their facility directly.  Several attempts to fax, notes finally emailed and confirmed receipt.  1140: CSW spoke with Pam, she does have notes but has not spoken to director yet to determine if pt can return.  Will call back shortly.   1545: CSW attempted to call pt wife, left message. CSW attempted to call Pam/[facility], left message.  Expected Discharge Plan: [place] Barriers to Discharge: Continued Medical Work up, Other (must enter comment) (checking if pt can do PT at memory care rather than SNF)  Expected Discharge Plan and Services Expected Discharge Plan: [place] In-house Referral: Clinical Social Work   Post Acute Care Choice:  (TBD: memory care vs SNF) Living arrangements for the past 2 months: [place] ([facility])                                       Social Determinants of Health (SDOH) Interventions    Readmission Risk Interventions     No data to display

## 2022-05-24 NOTE — Progress Notes (Signed)
PROGRESS NOTE    JACKSON COFFIELD  OIZ:[id number] DOB: 1945/11/19 DOA: 05/20/2022 PCP: Chevis Pretty, FNP   Brief Narrative:   HAYDIN CALANDRA is a 76 y.o. male presenting after unwitnessed fall at SNF several days prior to admission with medical history significant of Dementia, atrial fibrillation not on anticoagulation, hypertension, hyperlipidemia and CAD who presents with difficulty weightbearing. Imaging at previous facility was reported negative. Treated for presumed UTI at outside facility. Admitted to hospital service after imaging in our ED confirmed subcapital R femur neck Fx. Ortho consulted at admission.  Status post ORIF 05/22/2022.  Tentative plan to return back to previous memory care unit/facility as early as 05/24/2022, unclear if this facility has appropriate staffing to continue physical therapy at current needs.  He may require transition to a different facility for acute physical therapy and skilled care needs until he is safe to return back to previous memory care unit.  Wife is agreeable.   Assessment & Plan: Principal Problem:   Hip fracture ([place]) Active Problems:   Hyperlipidemia   CAD (coronary artery disease)   Dementia (HCC)   AF (paroxysmal atrial fibrillation) (HCC)   UTI (urinary tract infection)   Hypotension  Hip fracture (HCC) -Acute impacted mildly angulated subcapital right femoral neck fracture, presumed traumatic -Ortho following, ORIF 05/22/22 -PRN hydrocodone and IV morphine for pain -wean aggressively as tolerated -patient has been well controlled on hydrocodone alone over the past 24 hours  Hypotension, chronic -ongoing issue per signout from facilty -IVF in ED with minimal response -No leukocytosis and he is already completed antibiotics for UTI -Possibly exacerbated by narcotics - high risk for polypharmacy and orthostatics post operatively   UTI (urinary tract infection) POA, resolved prior to admit UTI dx on 9/18- completed abx therapy at  this point UA WNL here - follow cultures for completion   AF (paroxysmal atrial fibrillation) ([place]) Rate controlled.  Not on anticoagulation.   Dementia ([place]) Severe and is completely disoriented at baseline   Hyperlipidemia Continue statin pending med rec.  DVT prophylaxis: Per Ortho Code Status: DNR Family Communication: Wife updated at bedside  Status is: Inpt  Dispo: The patient is from: SNF/memory care unit              Anticipated d/c is to: TBD - likely back to facility; unclear if skilled care is offered at his memory care unit, may require transition to outside facility prior to disposition back to his memory care unit.              Anticipated d/c date is: 24-48h pending postsurgical course and physical therapy needs              Patient currently NOT medically stable for discharge  Consultants:  Ortho, Dr Marcelino Scot  Procedures:  TBD  Antimicrobials:  Perioperatively per ortho   Subjective: No acute issues/events overnight, pain currently well controlled  Objective: Vitals:   05/23/22 0800 05/23/22 1358 05/23/22 2136 05/24/22 0800  BP: (!) 112/52 91/60 (!) 119/52 119/72  Pulse: 63 71  68  Resp: 17 14    Temp: 98.4 F (36.9 C) 98.6 F (37 C)  98.3 F (36.8 C)  TempSrc: Oral Oral  Oral  SpO2: 100% 100%  96%    Intake/Output Summary (Last 24 hours) at 05/24/2022 1502 Last data filed at 05/24/2022 0900 Gross per 24 hour  Intake 1424.57 ml  Output 1400 ml  Net 24.57 ml    There were no vitals filed for this  visit.  Examination:  General:  Pleasantly resting in bed, No acute distress. HEENT:  Normocephalic atraumatic.  Sclerae nonicteric, noninjected.  Extraocular movements intact bilaterally. Neck:  Without mass or deformity.  Trachea is midline. Lungs:  Clear to auscultate bilaterally without rhonchi, wheeze, or rales. Heart:  Regular rate and rhythm.  Without murmurs, rubs, or gallops. Abdomen:  Soft, nontender, nondistended.  Without guarding or  rebound. Extremities: Postop bandage clean dry intact Vascular:  Dorsalis pedis and posterior tibial pulses palpable bilaterally. Skin:  Warm and dry, no erythema, no ulcerations.  Data Reviewed: I have personally reviewed following labs and imaging studies  CBC: Recent Labs  Lab 05/21/22 0050 05/22/22 0604 05/23/22 0106  WBC 10.4 8.8 10.2  NEUTROABS 7.1  --   --   HGB 14.1 12.0* 11.9*  HCT 43.4 35.2* 34.5*  MCV 102.1* 97.5 96.4  PLT 219 201 205    Basic Metabolic Panel: Recent Labs  Lab 05/21/22 0050 05/23/22 0106  NA 145 141  K 4.1 4.8  CL 115* 109  CO2 22 23  GLUCOSE 100* 183*  BUN 28* 25*  CREATININE 0.89 0.96  CALCIUM 8.6* 8.4*    GFR: Estimated Creatinine Clearance: 66.5 mL/min (by C-G formula based on SCr of 0.96 mg/dL). Liver Function Tests: Recent Labs  Lab 05/21/22 0050  AST 51*  ALT 37  ALKPHOS 53  BILITOT 0.7  PROT 5.8*  ALBUMIN 3.0*    No results for input(s): "LIPASE", "AMYLASE" in the last 168 hours. No results for input(s): "AMMONIA" in the last 168 hours. Coagulation Profile: No results for input(s): "INR", "PROTIME" in the last 168 hours. Cardiac Enzymes: No results for input(s): "CKTOTAL", "CKMB", "CKMBINDEX", "TROPONINI" in the last 168 hours.  BNP (last 3 results) No results for input(s): "PROBNP" in the last 8760 hours. HbA1C: No results for input(s): "HGBA1C" in the last 72 hours. CBG: No results for input(s): "GLUCAP" in the last 168 hours. Lipid Profile: No results for input(s): "CHOL", "HDL", "LDLCALC", "TRIG", "CHOLHDL", "LDLDIRECT" in the last 72 hours. Thyroid Function Tests: No results for input(s): "TSH", "T4TOTAL", "FREET4", "T3FREE", "THYROIDAB" in the last 72 hours. Anemia Panel: No results for input(s): "VITAMINB12", "FOLATE", "FERRITIN", "TIBC", "IRON", "RETICCTPCT" in the last 72 hours. Sepsis Labs: No results for input(s): "PROCALCITON", "LATICACIDVEN" in the last 168 hours.  Recent Results (from the past  240 hour(s))  MRSA Next Gen by PCR, Nasal     Status: Abnormal   Collection Time: 05/21/22  4:54 PM   Specimen: Nasal Mucosa; Nasal Swab  Result Value Ref Range Status   MRSA by PCR Next Gen DETECTED (A) NOT DETECTED Final    Comment: CRITICAL RESULT CALLED TO, READ BACK BY AND VERIFIED WITH:  C/ ERIC N., RN 05/22/22 0005 A. LAFRANCE (NOTE) The GeneXpert MRSA Assay (FDA approved for NASAL specimens only), is one component of a comprehensive MRSA colonization surveillance program. It is not intended to diagnose MRSA infection nor to guide or monitor treatment for MRSA infections. Test performance is not FDA approved in patients less than 62 years old. Performed at [facility], [address], [place], [place] [address]          Radiology Studies: No results found.  Scheduled Meds:  aspirin EC  325 mg Oral Q breakfast   docusate sodium  100 mg Oral BID   memantine  10 mg Oral BID   mupirocin ointment  1 Application Nasal BID   senna  1 tablet Oral BID  Continuous Infusions:  lactated ringers 50 mL/hr at 05/22/22 1535     LOS: 3 days   Time spent:  Azucena Fallen, DO [facility]  If 7PM-7AM, please contact night-coverage [email or web address]  05/24/2022, 3:02 PM

## 2022-05-24 NOTE — Progress Notes (Signed)
    Subjective:  Patient has dementia at baseline. He is pleasantly confused this morning. No reported events overnight. Unable to fully assess due to mental status.   Objective:   VITALS:   Vitals:   05/23/22 0458 05/23/22 0800 05/23/22 1358 05/23/22 2136  BP: (!) 113/50 (!) 112/52 91/60 (!) 119/52  Pulse: 67 63 71   Resp: $Remo'17 17 14   'wTsav$ Temp: 98.2 F (36.8 C) 98.4 F (36.9 C) 98.6 F (37 C)   TempSrc: Oral Oral Oral   SpO2: 99% 100% 100%     He is lying in bed. NAD. He is oriented to his name only.  ABD soft Neurovascular intact Intact pulses distally Incision: dressing C/D/I No cellulitis present Compartment soft He is not following commands well this morning but is moving his foot on his own.  Unable to fully assess sensation due to dementia.   Lab Results  Component Value Date   WBC 10.2 05/23/2022   HGB 11.9 (L) 05/23/2022   HCT 34.5 (L) 05/23/2022   MCV 96.4 05/23/2022   PLT 205 05/23/2022   BMET    Component Value Date/Time   NA 141 05/23/2022 0106   NA 141 01/28/2021 0923   K 4.8 05/23/2022 0106   CL 109 05/23/2022 0106   CO2 23 05/23/2022 0106   GLUCOSE 183 (H) 05/23/2022 0106   BUN 25 (H) 05/23/2022 0106   BUN 15 01/28/2021 0923   CREATININE 0.96 05/23/2022 0106   CREATININE 0.86 01/16/2013 0845   CALCIUM 8.4 (L) 05/23/2022 0106   EGFR 82 01/28/2021 0923   GFRNONAA >60 05/23/2022 0106   GFRNONAA >89 01/16/2013 0845     Assessment/Plan: 2 Days Post-Op   Principal Problem:   Hip fracture (HCC) Active Problems:   Hyperlipidemia   CAD (coronary artery disease)   Dementia (HCC)   AF (paroxysmal atrial fibrillation) (HCC)   UTI (urinary tract infection)   Hypotension   WBAT with walker DVT ppx: Aspirin, SCDs, TEDS PO pain control PT/OT: Worked some with PT and OT yesterday. Continue today.  Dispo:  Patient under care of the medical team, disposition per their recommendation. Discussion about going back to memory care unit vs SNF. Pain  medication and DVT ppx printed in chart.    Charlott Rakes, PA-C 05/24/2022, 7:45 AM   [facility]  [facility] [address], [place], [place] [address] Phone: [phone] [email or web address] Facebook  [employer]

## 2022-05-24 NOTE — Care Management Important Message (Signed)
Important Message  Patient Details  Name: Adam Rowland MRN: [id number] Date of Birth: Jun 21, 1946   Medicare Important Message Given:  Yes     Hannah Beat 05/24/2022, 4:00 PM

## 2022-05-24 NOTE — Plan of Care (Signed)
  Problem: Education: Goal: Knowledge of General Education information will improve Description: Including pain rating scale, medication(s)/side effects and non-pharmacologic comfort measures Outcome: Progressing   Problem: Health Behavior/Discharge Planning: Goal: Ability to manage health-related needs will improve Outcome: Progressing   Problem: Clinical Measurements: Goal: Respiratory complications will improve Outcome: Progressing   Problem: Activity: Goal: Risk for activity intolerance will decrease Outcome: Progressing   Problem: Nutrition: Goal: Adequate nutrition will be maintained Outcome: Progressing   Problem: Coping: Goal: Level of anxiety will decrease Outcome: Progressing   Problem: Elimination: Goal: Will not experience complications related to bowel motility Outcome: Progressing   Problem: Pain Managment: Goal: General experience of comfort will improve Outcome: Progressing   

## 2022-05-25 ENCOUNTER — Other Ambulatory Visit: Payer: Self-pay

## 2022-05-25 DIAGNOSIS — S72001D Fracture of unspecified part of neck of right femur, subsequent encounter for closed fracture with routine healing: Secondary | ICD-10-CM

## 2022-05-25 MED ORDER — ADULT MULTIVITAMIN W/MINERALS CH
1.0000 | ORAL_TABLET | Freq: Every day | ORAL | Status: DC
  Administered 2022-05-25 – 2022-05-27 (×3): 1 via ORAL
  Filled 2022-05-25 (×3): qty 1

## 2022-05-25 MED ORDER — ENSURE ENLIVE PO LIQD
237.0000 mL | Freq: Two times a day (BID) | ORAL | Status: DC
  Administered 2022-05-25 – 2022-05-27 (×5): 237 mL via ORAL

## 2022-05-25 NOTE — Progress Notes (Signed)
   Adam Rowland  YIF:[id number] DOB: 03-29-1946 DOA: 05/20/2022 PCP: Chevis Pretty, FNP    Brief Narrative:  76 year old with a history of dementia, atrial fibrillation not on anticoagulation, HTN, HLD, and CAD who presented to the ER after an unwitnessed fall at his SNF with subsequent observed weightbearing difficulty.  Imaging in the ER confirmed a subcapital right femoral neck fracture.  The patient underwent ORIF 05/22/2022  Consultants:  Orthopedics  Goals of Care:  Code Status: DNR   DVT prophylaxis: Full dose aspirin  Interim Hx: Afebrile.  Vital signs stable.  Resting comfortably at time of visit/somnolent.  Assessment & Plan:  Right acute impacted and angulated subcapital femoral neck fracture Status post ORIF 05/22/2022  Chronic hypotension Blood pressure stable presently.  UTI POA Diagnosed 9/18 and dosed with antibiotic as an outpatient -no evidence of persisting infection with normal UA this admission  Paroxysmal atrial fibrillation -chronic Rate controlled   Dementia Lives in a memory care unit -reportedly severe with patient fully disoriented at baseline  HLD Continue statin   Family Communication: Spoke with wife at bedside Disposition: Needs SNF for rehab stay prior to returning to memory care unit   Objective: Blood pressure 112/76, pulse 89, temperature 98.7 F (37.1 C), temperature source Oral, resp. rate 14, height 5\' 9"  (1.753 m), weight 73 kg, SpO2 94 %. No intake or output data in the 24 hours ending 05/25/22 0957 Filed Weights   05/25/22 0038  Weight: 73 kg    Examination: General: No acute respiratory distress Lungs: Clear to auscultation bilaterally without wheezes or crackles Cardiovascular: Regular rate without murmur gallop or rub normal S1 and S2 Abdomen: Nontender, nondistended, soft, bowel sounds positive, no rebound, no ascites, no appreciable mass Extremities: No significant cyanosis, clubbing, or edema bilateral lower  extremities  CBC: Recent Labs  Lab 05/21/22 0050 05/22/22 0604 05/23/22 0106  WBC 10.4 8.8 10.2  NEUTROABS 7.1  --   --   HGB 14.1 12.0* 11.9*  HCT 43.4 35.2* 34.5*  MCV 102.1* 97.5 96.4  PLT 219 201 867   Basic Metabolic Panel: Recent Labs  Lab 05/21/22 0050 05/23/22 0106  NA 145 141  K 4.1 4.8  CL 115* 109  CO2 22 23  GLUCOSE 100* 183*  BUN 28* 25*  CREATININE 0.89 0.96  CALCIUM 8.6* 8.4*   GFR: Estimated Creatinine Clearance: 66.5 mL/min (by C-G formula based on SCr of 0.96 mg/dL).   Scheduled Meds:  aspirin EC  325 mg Oral Q breakfast   docusate sodium  100 mg Oral BID   memantine  10 mg Oral BID   mupirocin ointment  1 Application Nasal BID   senna  1 tablet Oral BID   Continuous Infusions:  lactated ringers 50 mL/hr at 05/22/22 1535     LOS: 4 days   Cherene Altes, MD [facility] Office  [phone] Pager - Text Page per Shea Evans  If 7PM-7AM, please contact night-coverage per Amion 05/25/2022, 9:57 AM

## 2022-05-25 NOTE — Progress Notes (Signed)
Initial Nutrition Assessment  DOCUMENTATION CODES:   Not applicable  INTERVENTION:  - Add Ensure BID. Ensure Enlive po BID, each supplement provides 350 kcal and 20 grams of protein.  -Add MVI q day.   NUTRITION DIAGNOSIS:   Increased nutrient needs related to hip fracture as evidenced by estimated needs.  GOAL:   Patient will meet greater than or equal to 90% of their needs  MONITOR:   PO intake, Supplement acceptance  REASON FOR ASSESSMENT:   Malnutrition Screening Tool    ASSESSMENT:   76 y.o. male admits related to concerns of non-weight bearing following a fall several days ago. PMH includes: dementia, a-fib, HTN, HLD, CAD. Pt is currently receiving medical management for hip fracture.  Meds reviewed:  Colace, senokot.   Pt was sleeping at time of assessment. Wife present at bedside. Wife reports that the pt normally has a good appetite and was eating well at SNF prior to admission. Wts stable per record. RD will add Ensure BID for now to help pt receive more calories and protein r/t increased metabolic demand in setting of hip fracture.   NUTRITION - FOCUSED PHYSICAL EXAM:  Flowsheet Row Most Recent Value  Orbital Region No depletion  Upper Arm Region Unable to assess  Thoracic and Lumbar Region Unable to assess  Buccal Region No depletion  Temple Region Moderate depletion  Clavicle Bone Region Mild depletion  Clavicle and Acromion Bone Region Unable to assess  Scapular Bone Region Unable to assess  Dorsal Hand No depletion  Patellar Region Unable to assess  Anterior Thigh Region Unable to assess  Posterior Calf Region Unable to assess  Edema (RD Assessment) None  Hair Reviewed  Eyes Unable to assess  Mouth Unable to assess  Skin Reviewed  Nails Reviewed       Diet Order:   Diet Order             Diet regular Room service appropriate? No; Fluid consistency: Thin  Diet effective now                   EDUCATION NEEDS:   Not appropriate  for education at this time  Skin:  Skin Assessment: Reviewed RN Assessment  Last BM:  05/23/22  Height:   Ht Readings from Last 1 Encounters:  05/25/22 5\' 9"  (1.753 m)    Weight:   Wt Readings from Last 1 Encounters:  05/25/22 73 kg    Ideal Body Weight:     BMI:  Body mass index is 23.77 kg/m.  Estimated Nutritional Needs:   Kcal:  1825-2190 kcals  Protein:  90-110 gm  Fluid:  1825-2190 mL  Thalia Bloodgood, RD, LDN, CNSC

## 2022-05-25 NOTE — Plan of Care (Signed)
  Problem: Education: Goal: Knowledge of General Education information will improve Description: Including pain rating scale, medication(s)/side effects and non-pharmacologic comfort measures Outcome: Not Progressing   Problem: Health Behavior/Discharge Planning: Goal: Ability to manage health-related needs will improve Outcome: Not Progressing   Problem: Clinical Measurements: Goal: Ability to maintain clinical measurements within normal limits will improve Outcome: Not Progressing Goal: Will remain free from infection Outcome: Not Progressing Goal: Diagnostic test results will improve Outcome: Not Progressing Goal: Respiratory complications will improve Outcome: Not Progressing Goal: Cardiovascular complication will be avoided Outcome: Not Progressing   Problem: Activity: Goal: Risk for activity intolerance will decrease Outcome: Not Progressing   Problem: Nutrition: Goal: Adequate nutrition will be maintained Outcome: Not Progressing   Problem: Coping: Goal: Level of anxiety will decrease Outcome: Not Progressing   Problem: Elimination: Goal: Will not experience complications related to bowel motility Outcome: Not Progressing Goal: Will not experience complications related to urinary retention Outcome: Not Progressing   Problem: Pain Managment: Goal: General experience of comfort will improve Outcome: Not Progressing   Problem: Safety: Goal: Ability to remain free from injury will improve Outcome: Not Progressing   Problem: Skin Integrity: Goal: Risk for impaired skin integrity will decrease Outcome: Not Progressing   Problem: Education: Goal: Verbalization of understanding the information provided (i.e., activity precautions, restrictions, etc) will improve Outcome: Not Progressing Goal: Individualized Educational Video(s) Outcome: Not Progressing   Problem: Activity: Goal: Ability to ambulate and perform ADLs will improve Outcome: Not Progressing    Problem: Clinical Measurements: Goal: Postoperative complications will be avoided or minimized Outcome: Not Progressing   Problem: Self-Concept: Goal: Ability to maintain and perform role responsibilities to the fullest extent possible will improve Outcome: Not Progressing   Problem: Pain Management: Goal: Pain level will decrease Outcome: Not Progressing   

## 2022-05-25 NOTE — TOC Progression Note (Addendum)
Transition of Care [facility]) - Progression Note    Patient Details  Name: LOGON UTTECH MRN: [id number] Date of Birth: 1946-06-23  [facility]) CM/SW Contact  Joanne Chars, LCSW Phone Number: 05/25/2022, 9:43 AM  Clinical Narrative:   CSW received call from pt wife, [facility] told her yesterday they cannot accept without pt first going to SNF, she is in agreement to send out referral.  Referral sent out in hub.   1440: CSW spoke with pt wife, presented current bed offers.  She is asking for response from [facility] and [facility]-reached out to those facilities.     Expected Discharge Plan: [place] Barriers to Discharge: Continued Medical Work up, Other (must enter comment) (checking if pt can do PT at memory care rather than SNF)  Expected Discharge Plan and Services Expected Discharge Plan: [place] In-house Referral: Clinical Social Work   Post Acute Care Choice:  (TBD: memory care vs SNF) Living arrangements for the past 2 months: [place] ([facility])                                       Social Determinants of Health (SDOH) Interventions Housing Interventions:  (Pt came from SNF)  Readmission Risk Interventions     No data to display

## 2022-05-25 NOTE — Progress Notes (Signed)
Physical Therapy Treatment Patient Details Name: Adam Rowland MRN: [id number] DOB: 1946/03/06 Today's Date: 05/25/2022   History of Present Illness Pt is a 76 y.o. M who presents 05/20/2022 after a fall about a week ago. Found to have displaced R femoral neck fx now s/p hemiarthroplasty, direct anterior approach. Significant PMH: dementia, atrial fibrillation, CAD.    PT Comments    Pt with increased lethargy and not following commands in comparison to evaluation 9/25. Pt keeping eyes closed throughout session with no attempts to verbalize. Pt requiring two person total assist for bed mobility. Tolerated sitting edge of bed ~15 minutes, but deferred transfer out of bed due to level of arousal. Continue to recommend SNF for ongoing Physical Therapy.      Recommendations for follow up therapy are one component of a multi-disciplinary discharge planning process, led by the attending physician.  Recommendations may be updated based on patient status, additional functional criteria and insurance authorization.  Follow Up Recommendations  Skilled nursing-short term rehab (<3 hours/day) Can patient physically be transported by private vehicle: No   Assistance Recommended at Discharge Frequent or constant Supervision/Assistance  Patient can return home with the following Two people to help with walking and/or transfers;Two people to help with bathing/dressing/bathroom   Equipment Recommendations  Other (comment) (defer)    Recommendations for Other Services       Precautions / Restrictions Precautions Precautions: Fall Restrictions Weight Bearing Restrictions: Yes RLE Weight Bearing: Weight bearing as tolerated     Mobility  Bed Mobility Overal bed mobility: Needs Assistance Bed Mobility: Supine to Sit, Sit to Supine     Supine to sit: Total assist, +2 for physical assistance Sit to supine: Total assist, +2 for physical assistance   General bed mobility comments: No initiation noted  by pt    Transfers                   General transfer comment: deferred due to level of arousal    Ambulation/Gait                   Stairs             Wheelchair Mobility    Modified Rankin (Stroke Patients Only)       Balance Overall balance assessment: Needs assistance Sitting-balance support: Feet supported Sitting balance-Leahy Scale: Poor Sitting balance - Comments: Pt requiring consistent min-modA for sitting balance; not heavily guarding away from R side this session, but decreased balance reactions with intermittent posterior vs right lateral lean                                    Cognition Arousal/Alertness: Lethargic Behavior During Therapy: Flat affect, Restless Overall Cognitive Status: History of cognitive impairments - at baseline                                 General Comments: Hx dementia; pt non verbal this session and keeping eyes closed. did not follow commands        Exercises General Exercises - Lower Extremity Ankle Circles/Pumps: PROM, Both, 10 reps, Supine Other Exercises Other Exercises: Passive stretch into bilateral knee extension (pt with tendency to keep knees flexed)    General Comments        Pertinent Vitals/Pain Pain Assessment Pain Assessment: PAINAD Breathing: normal Negative Vocalization: occasional  moan/groan, low speech, negative/disapproving quality Facial Expression: facial grimacing Body Language: rigid, fists clenched, knees up, pushing/pulling away, strikes out Consolability: unable to console, distract or reassure PAINAD Score: 7 Pain Location: R hip Pain Descriptors / Indicators: Grimacing, Guarding Pain Intervention(s): Limited activity within patient's tolerance, Monitored during session, Repositioned, Other (comment) (RN unable to give pain medication due to level of arousal)    Home Living                          Prior Function             PT Goals (current goals can now be found in the care plan section) Acute Rehab PT Goals Patient Stated Goal: did not state Potential to Achieve Goals: Fair Progress towards PT goals: Not progressing toward goals - comment    Frequency    Min 3X/week      PT Plan Current plan remains appropriate    Co-evaluation              AM-PAC PT "6 Clicks" Mobility   Outcome Measure  Help needed turning from your back to your side while in a flat bed without using bedrails?: Total Help needed moving from lying on your back to sitting on the side of a flat bed without using bedrails?: Total Help needed moving to and from a bed to a chair (including a wheelchair)?: Total Help needed standing up from a chair using your arms (e.g., wheelchair or bedside chair)?: Total Help needed to walk in hospital room?: Total Help needed climbing 3-5 steps with a railing? : Total 6 Click Score: 6    End of Session   Activity Tolerance: Patient limited by lethargy Patient left: in bed;with call bell/phone within reach;with bed alarm set Nurse Communication: Mobility status PT Visit Diagnosis: Other abnormalities of gait and mobility (R26.89);Pain Pain - Right/Left: Right Pain - part of body: Hip     Time: 0902-0930 PT Time Calculation (min) (ACUTE ONLY): 28 min  Charges:  $Therapeutic Activity: 23-37 mins                     Wyona Almas, PT, DPT Acute Rehabilitation Services Office [phone]    Deno Etienne 05/25/2022, 10:38 AM

## 2022-05-26 DIAGNOSIS — S72001D Fracture of unspecified part of neck of right femur, subsequent encounter for closed fracture with routine healing: Secondary | ICD-10-CM | POA: Diagnosis not present

## 2022-05-26 DIAGNOSIS — F0390 Unspecified dementia without behavioral disturbance: Secondary | ICD-10-CM | POA: Diagnosis not present

## 2022-05-26 DIAGNOSIS — I480 Paroxysmal atrial fibrillation: Secondary | ICD-10-CM

## 2022-05-26 MED ORDER — HYDROCODONE-ACETAMINOPHEN 5-325 MG PO TABS: 1.0000 | ORAL_TABLET | Freq: Three times a day (TID) | ORAL | Status: DC | PRN

## 2022-05-26 MED ORDER — TRAMADOL HCL 50 MG PO TABS: 50.0000 mg | ORAL_TABLET | Freq: Four times a day (QID) | ORAL | Status: DC | PRN

## 2022-05-26 MED ORDER — TRAMADOL HCL 50 MG PO TABS: 50.0000 mg | ORAL_TABLET | Freq: Two times a day (BID) | ORAL | Status: DC | PRN

## 2022-05-26 MED ORDER — HYDROCODONE-ACETAMINOPHEN 5-325 MG PO TABS: 1.0000 | ORAL_TABLET | Freq: Four times a day (QID) | ORAL | Status: DC | PRN

## 2022-05-26 NOTE — Plan of Care (Signed)
  Problem: Safety: Goal: Ability to remain free from injury will improve Outcome: Progressing   Problem: Skin Integrity: Goal: Risk for impaired skin integrity will decrease Outcome: Progressing   Problem: Pain Management: Goal: Pain level will decrease Outcome: Progressing

## 2022-05-26 NOTE — Progress Notes (Signed)
Occupational Therapy Treatment Patient Details Name: Adam Rowland MRN: [id number] DOB: May 14, 1946 Today's Date: 05/26/2022   History of present illness Pt is a 76 y.o. M who presents 05/20/2022 after a fall about a week ago. Found to have displaced R femoral neck fx now s/p hemiarthroplasty, direct anterior approach. Significant PMH: dementia, atrial fibrillation, CAD.   OT comments  Upon arrival, pt supine in bed with eyes closed and wife at bedside. Pt answering simple yes no questions (inconsistently) but not opening his eyes during session. Closing eyes to pain and tendency for flexion of BLEs. Pt tolerating sitting at EOB for ~10 minutes with Max A for support. Participating in self feeding task with Max A. Pt performing sit<>stand with Max A +2 and stedy. Total A for bed mobility and peri care after BM. Continue to recommend dc to SNF and will continue to follow acutely as admitted.    Recommendations for follow up therapy are one component of a multi-disciplinary discharge planning process, led by the attending physician.  Recommendations may be updated based on patient status, additional functional criteria and insurance authorization.    Follow Up Recommendations  Skilled nursing-short term rehab (<3 hours/day)    Assistance Recommended at Discharge Frequent or constant Supervision/Assistance  Patient can return home with the following   (Total A)   Equipment Recommendations  Other (comment) (Defer to next venue)    Recommendations for Other Services      Precautions / Restrictions Precautions Precautions: Fall Restrictions Weight Bearing Restrictions: Yes RLE Weight Bearing: Weight bearing as tolerated       Mobility Bed Mobility Overal bed mobility: Needs Assistance Bed Mobility: Supine to Sit, Sit to Supine Rolling: Total assist, +2 for physical assistance   Supine to sit: Total assist, +2 for physical assistance Sit to supine: Total assist, +2 for physical  assistance   General bed mobility comments: No initiation noted by pt    Transfers Overall transfer level: Needs assistance Equipment used: Ambulation equipment used Transfers: Sit to/from Stand Sit to Stand: Max assist, +2 physical assistance           General transfer comment: Heavy Max A +2 for power up and weight shift forward. Use of stedy. sit<>Stand x3 from EOB     Balance Overall balance assessment: Needs assistance Sitting-balance support: Feet supported Sitting balance-Leahy Scale: Poor Sitting balance - Comments: Pt requiring consistent min-modA for sitting balance; not heavily guarding away from R side this session, but decreased balance reactions with intermittent posterior vs right lateral lean   Standing balance support: Bilateral upper extremity supported Standing balance-Leahy Scale: Zero                             ADL either performed or assessed with clinical judgement   ADL Overall ADL's : Needs assistance/impaired Eating/Feeding: Maximal assistance;Sitting Eating/Feeding Details (indicate cue type and reason): Max A for sitting balance at EOB. Max A to eat ice cream; Max hand over hand                         Toileting- Clothing Manipulation and Hygiene: Total assistance;Bed level Toileting - Clothing Manipulation Details (indicate cue type and reason): Total A for peri care after BM     Functional mobility during ADLs: +2 for physical assistance;Maximal assistance (sit<>stand with stedy) General ADL Comments: Pt requiring Total A for bed mobility, sitting at EOB for ~10 min,  and then performing sit<>Stand with Total A +2 and stedy. Upon return to bed, bowel incontinence. Total A for rolling and peri care    Extremity/Trunk Assessment Upper Extremity Assessment Upper Extremity Assessment: Generalized weakness   Lower Extremity Assessment Lower Extremity Assessment: Defer to PT evaluation        Vision       Perception      Praxis      Cognition Arousal/Alertness: Lethargic Behavior During Therapy: Flat affect, Restless Overall Cognitive Status: History of cognitive impairments - at baseline                                 General Comments: Hx dementia. Keeping eye closed for majority of session. reaching out and grabing different objects. Pt answering yes/no questions inconsistently. Mumbling sentences - mostly word salad.        Exercises Other Exercises Other Exercises: Passive stretch into bilateral knee extension (pt with tendency to keep knees flexed)    Shoulder Instructions       General Comments Wife, joyce, present throughout. VSS. NOting two red circles on lower back; notified RN    Pertinent Vitals/ Pain       Pain Assessment Pain Assessment: Faces Faces Pain Scale: Hurts even more Pain Location: R hip Pain Descriptors / Indicators: Grimacing, Guarding Pain Intervention(s): Monitored during session, Limited activity within patient's tolerance, Repositioned  Home Living                                          Prior Functioning/Environment              Frequency  Min 2X/week        Progress Toward Goals  OT Goals(current goals can now be found in the care plan section)  Progress towards OT goals: Progressing toward goals  Acute Rehab OT Goals OT Goal Formulation: Patient unable to participate in goal setting Time For Goal Achievement: 06/06/22 Potential to Achieve Goals: Good ADL Goals Pt Will Perform Eating: with min assist;sitting Pt Will Perform Grooming: with min assist;sitting Pt Will Perform Upper Body Dressing: with min assist;sitting Additional ADL Goal #1: Pt will perform bed mobility with Mod A in preparation for ADLs Additional ADL Goal #2: Pt will tolerate sitting at EOB for 10 minute with Min A in preparation for ADLs  Plan Discharge plan remains appropriate    Co-evaluation    PT/OT/SLP  Co-Evaluation/Treatment: Yes Reason for Co-Treatment: For patient/therapist safety;To address functional/ADL transfers   OT goals addressed during session: ADL's and self-care      AM-PAC OT "6 Clicks" Daily Activity     Outcome Measure   Help from another person eating meals?: A Lot Help from another person taking care of personal grooming?: A Lot Help from another person toileting, which includes using toliet, bedpan, or urinal?: Total Help from another person bathing (including washing, rinsing, drying)?: A Lot Help from another person to put on and taking off regular upper body clothing?: A Lot Help from another person to put on and taking off regular lower body clothing?: Total 6 Click Score: 10    End of Session Equipment Utilized During Treatment: Gait belt  OT Visit Diagnosis: Other abnormalities of gait and mobility (R26.89);Unsteadiness on feet (R26.81);Muscle weakness (generalized) (M62.81);Pain Pain - Right/Left: Right Pain - part of body:  Hip   Activity Tolerance Patient tolerated treatment well;Patient limited by pain   Patient Left in bed;with call bell/phone within reach;with bed alarm set;with family/visitor present   Nurse Communication Mobility status;Other (comment) (Need a new primofit and has two red spots on lower back)        Time: 1354-1434 OT Time Calculation (min): 40 min  Charges: OT General Charges $OT Visit: 1 Visit OT Treatments $Self Care/Home Management : 23-37 mins  Sukari Grist MSOT, OTR/L Acute Rehab Office: [place] 05/26/2022, 3:15 PM

## 2022-05-26 NOTE — TOC Progression Note (Addendum)
Transition of Care [facility]) - Progression Note    Patient Details  Name: Adam Rowland MRN: [id number] Date of Birth: [birth year]  [facility]) CM/SW Contact  Joanne Chars, LCSW Phone Number: 05/26/2022, 9:47 AM  Clinical Narrative:   [facility] offer bed.  CSW spoke with pt wife, updated her on bed offers, she wants to accept [facility] offer.  Per [facility], only semi private room available today--wife informed and agreeable to this.  Pt was walking independently at memory care prior to this incident.  Auth request submitted in [place].   1255: auth approved: [id number], 5 days: 9/28-10/2.  MD informed.    Expected Discharge Plan: [place] Barriers to Discharge: Continued Medical Work up, Other (must enter comment) (checking if pt can do PT at memory care rather than SNF)  Expected Discharge Plan and Services Expected Discharge Plan: [place] In-house Referral: Clinical Social Work   Post Acute Care Choice:  (TBD: memory care vs SNF) Living arrangements for the past 2 months: [place] ([facility])                                       Social Determinants of Health (SDOH) Interventions Housing Interventions:  (Pt came from SNF)  Readmission Risk Interventions     No data to display

## 2022-05-26 NOTE — Progress Notes (Signed)
   Adam Rowland  MVE:[id number] DOB: 12-03-1945 DOA: 05/20/2022 PCP: Chevis Pretty, FNP    Brief Narrative:  76 year old with a history of dementia, atrial fibrillation not on anticoagulation, HTN, HLD, and CAD who presented to the ER after an unwitnessed fall at his SNF with subsequent observed weightbearing difficulty.  Imaging in the ER confirmed a subcapital right femoral neck fracture.  The patient underwent ORIF 05/22/2022  Consultants:  Orthopedics  Goals of Care:  Code Status: DNR   DVT prophylaxis: Full dose aspirin  Interim Hx: No acute events reported overnight.  Afebrile.  Vital signs stable.  Remains very sedate.  Patient's wife at bedside confirms this is very different than his baseline.  Patient is usually alert and interactive during the day, though demented.  Does not appear to be in extremis but is somnolent.  Assessment & Plan:  Right acute impacted and angulated subcapital femoral neck fracture Status post ORIF 05/22/2022 -needs SNF rehab stay  Chronic hypotension Blood pressure stable   UTI POA Diagnosed 9/18 and dosed with antibiotic as an outpatient -no evidence of persisting infection with normal UA this admission  Paroxysmal atrial fibrillation -chronic Rate controlled   Dementia Lives in a memory care unit - reportedly severe with patient fully disoriented at baseline  HLD Continue statin  Family Communication: Spoke with wife at bedside at length Disposition: Needs SNF for rehab stay prior to returning to memory care unit -possible discharge to [facility] 9/29 if mental status improves   Objective: Blood pressure (!) 101/51, pulse 70, temperature 98.3 F (36.8 C), temperature source Oral, resp. rate 15, height 5\' 9"  (1.753 m), weight 73 kg, SpO2 96 %.  Intake/Output Summary (Last 24 hours) at 05/26/2022 0918 Last data filed at 05/26/2022 2836 Gross per 24 hour  Intake 120 ml  Output 1000 ml  Net -880 ml   Filed Weights   05/25/22 0038   Weight: 73 kg    Examination: General: No acute respiratory distress Lungs: Clear to auscultation bilaterally without wheezes  Cardiovascular: Regular rate without murmur  Abdomen: Nontender, nondistended, soft, bowel sounds positive,  Extremities: No significant edema bilateral lower extremities  CBC: Recent Labs  Lab 05/21/22 0050 05/22/22 0604 05/23/22 0106  WBC 10.4 8.8 10.2  NEUTROABS 7.1  --   --   HGB 14.1 12.0* 11.9*  HCT 43.4 35.2* 34.5*  MCV 102.1* 97.5 96.4  PLT 219 201 629    Basic Metabolic Panel: Recent Labs  Lab 05/21/22 0050 05/23/22 0106  NA 145 141  K 4.1 4.8  CL 115* 109  CO2 22 23  GLUCOSE 100* 183*  BUN 28* 25*  CREATININE 0.89 0.96  CALCIUM 8.6* 8.4*    GFR: Estimated Creatinine Clearance: 66.5 mL/min (by C-G formula based on SCr of 0.96 mg/dL).   Scheduled Meds:  aspirin EC  325 mg Oral Q breakfast   docusate sodium  100 mg Oral BID   feeding supplement  237 mL Oral BID BM   memantine  10 mg Oral BID   multivitamin with minerals  1 tablet Oral Daily   mupirocin ointment  1 Application Nasal BID   senna  1 tablet Oral BID     LOS: 5 days   Cherene Altes, MD [facility] Office  [phone] Pager - Text Page per Shea Evans  If 7PM-7AM, please contact night-coverage per [place] 05/26/2022, 9:18 AM

## 2022-05-26 NOTE — Progress Notes (Signed)
Physical Therapy Treatment Patient Details Name: Adam Rowland MRN: [id number] DOB: 19-Jan-1946 Today's Date: 05/26/2022   History of Present Illness Pt is a 76 y.o. M who presents 05/20/2022 after a fall about a week ago. Found to have displaced R femoral neck fx now s/p hemiarthroplasty, direct anterior approach. Significant PMH: dementia, atrial fibrillation, CAD.    PT Comments    Pt with very slow progress due to dementia and lethargy. Able to arouse pt by sitting EOB. Hopefully with repetition and a decr in pain pt will be able to progress. Continue to recommend SNF.    Recommendations for follow up therapy are one component of a multi-disciplinary discharge planning process, led by the attending physician.  Recommendations may be updated based on patient status, additional functional criteria and insurance authorization.  Follow Up Recommendations  Skilled nursing-short term rehab (<3 hours/day) Can patient physically be transported by private vehicle: No   Assistance Recommended at Discharge Frequent or constant Supervision/Assistance  Patient can return home with the following Two people to help with walking and/or transfers;Two people to help with bathing/dressing/bathroom   Equipment Recommendations  Other (comment) (defer to SNF)    Recommendations for Other Services       Precautions / Restrictions Precautions Precautions: Fall Restrictions Weight Bearing Restrictions: Yes RLE Weight Bearing: Weight bearing as tolerated     Mobility  Bed Mobility Overal bed mobility: Needs Assistance Bed Mobility: Supine to Sit, Sit to Supine, Rolling Rolling: Total assist   Supine to sit: Total assist, +2 for physical assistance Sit to supine: Total assist, +2 for physical assistance   General bed mobility comments: Assist for all aspects. Pt resistant to hip/trunk flexion to come to sitting EOB with severe posterior push    Transfers Overall transfer level: Needs  assistance Equipment used: Ambulation equipment used Transfers: Sit to/from Stand Sit to Stand: +2 physical assistance, Total assist           General transfer comment: Used bed pad underneath pts hips to raise hips from bed. Unable to fully extend with pt remaining in partial stand. Performed x 3.    Ambulation/Gait               General Gait Details: unable   Stairs             Wheelchair Mobility    Modified Rankin (Stroke Patients Only)       Balance Overall balance assessment: Needs assistance Sitting-balance support: Feet supported Sitting balance-Leahy Scale: Zero Sitting balance - Comments: Initially pt requiring +2 total assist due to pt pushing posteriorly into extension. Eventually pt min to mod assist   Standing balance support: Bilateral upper extremity supported Standing balance-Leahy Scale: Zero                              Cognition Arousal/Alertness: Lethargic Behavior During Therapy: Restless Overall Cognitive Status: History of cognitive impairments - at baseline                                 General Comments: Hx dementia; Pt only occasionally speaking. Eyes closed throughout session. Did not follow commands        Exercises Other Exercises Other Exercises: Passive stretch into bilateral knee extension (pt with tendency to keep knees flexed)    General Comments General comments (skin integrity, edema, etc.): Wife, joyce, present throughout.  VSS. NOting two red circles on lower back; notified RN      Pertinent Vitals/Pain Pain Assessment Pain Assessment: PAINAD Breathing: normal Negative Vocalization: occasional moan/groan, low speech, negative/disapproving quality Facial Expression: facial grimacing Body Language: rigid, fists clenched, knees up, pushing/pulling away, strikes out Consolability: distracted or reassured by voice/touch PAINAD Score: 6 Pain Location: R hip Pain Descriptors /  Indicators: Grimacing, Guarding    Home Living                          Prior Function            PT Goals (current goals can now be found in the care plan section) Acute Rehab PT Goals Patient Stated Goal: did not state Progress towards PT goals: Not progressing toward goals - comment    Frequency    Min 3X/week      PT Plan Current plan remains appropriate    Co-evaluation PT/OT/SLP Co-Evaluation/Treatment: Yes Reason for Co-Treatment: Complexity of the patient's impairments (multi-system involvement);For patient/therapist safety   OT goals addressed during session: ADL's and self-care      AM-PAC PT "6 Clicks" Mobility   Outcome Measure  Help needed turning from your back to your side while in a flat bed without using bedrails?: Total Help needed moving from lying on your back to sitting on the side of a flat bed without using bedrails?: Total Help needed moving to and from a bed to a chair (including a wheelchair)?: Total Help needed standing up from a chair using your arms (e.g., wheelchair or bedside chair)?: Total Help needed to walk in hospital room?: Total Help needed climbing 3-5 steps with a railing? : Total 6 Click Score: 6    End of Session Equipment Utilized During Treatment: Gait belt Activity Tolerance: Patient limited by lethargy Patient left: in bed;with call bell/phone within reach;with bed alarm set;with family/visitor present Nurse Communication: Mobility status PT Visit Diagnosis: Other abnormalities of gait and mobility (R26.89);Pain Pain - Right/Left: Right Pain - part of body: Hip     Time: 1355-1429 PT Time Calculation (min) (ACUTE ONLY): 34 min  Charges:  $Therapeutic Activity: 8-22 mins                     [place] [facility] [place] 05/26/2022, 3:26 PM

## 2022-05-27 DIAGNOSIS — R1312 Dysphagia, oropharyngeal phase: Secondary | ICD-10-CM | POA: Diagnosis not present

## 2022-05-27 DIAGNOSIS — Z9181 History of falling: Secondary | ICD-10-CM | POA: Diagnosis not present

## 2022-05-27 DIAGNOSIS — S72009S Fracture of unspecified part of neck of unspecified femur, sequela: Secondary | ICD-10-CM | POA: Diagnosis not present

## 2022-05-27 DIAGNOSIS — E785 Hyperlipidemia, unspecified: Secondary | ICD-10-CM | POA: Diagnosis not present

## 2022-05-27 DIAGNOSIS — I480 Paroxysmal atrial fibrillation: Secondary | ICD-10-CM | POA: Diagnosis not present

## 2022-05-27 DIAGNOSIS — Z4789 Encounter for other orthopedic aftercare: Secondary | ICD-10-CM | POA: Diagnosis not present

## 2022-05-27 DIAGNOSIS — S72001D Fracture of unspecified part of neck of right femur, subsequent encounter for closed fracture with routine healing: Secondary | ICD-10-CM | POA: Diagnosis not present

## 2022-05-27 DIAGNOSIS — S72001A Fracture of unspecified part of neck of right femur, initial encounter for closed fracture: Secondary | ICD-10-CM | POA: Diagnosis not present

## 2022-05-27 DIAGNOSIS — I2510 Atherosclerotic heart disease of native coronary artery without angina pectoris: Secondary | ICD-10-CM | POA: Diagnosis not present

## 2022-05-27 DIAGNOSIS — I959 Hypotension, unspecified: Secondary | ICD-10-CM | POA: Diagnosis not present

## 2022-05-27 DIAGNOSIS — M6281 Muscle weakness (generalized): Secondary | ICD-10-CM | POA: Diagnosis not present

## 2022-05-27 DIAGNOSIS — Z7401 Bed confinement status: Secondary | ICD-10-CM | POA: Diagnosis not present

## 2022-05-27 DIAGNOSIS — F0390 Unspecified dementia without behavioral disturbance: Secondary | ICD-10-CM | POA: Diagnosis not present

## 2022-05-27 DIAGNOSIS — R2689 Other abnormalities of gait and mobility: Secondary | ICD-10-CM | POA: Diagnosis not present

## 2022-05-27 DIAGNOSIS — Z471 Aftercare following joint replacement surgery: Secondary | ICD-10-CM | POA: Diagnosis not present

## 2022-05-27 DIAGNOSIS — I10 Essential (primary) hypertension: Secondary | ICD-10-CM | POA: Diagnosis not present

## 2022-05-27 LAB — AMMONIA: Ammonia: 20 umol/L (ref 9–35)

## 2022-05-27 LAB — COMPREHENSIVE METABOLIC PANEL
ALT: 23 U/L (ref 0–44)
AST: 29 U/L (ref 15–41)
Albumin: 2.3 g/dL — ABNORMAL LOW (ref 3.5–5.0)
Alkaline Phosphatase: 51 U/L (ref 38–126)
Anion gap: 5 (ref 5–15)
BUN: 13 mg/dL (ref 8–23)
CO2: 26 mmol/L (ref 22–32)
Calcium: 8.1 mg/dL — ABNORMAL LOW (ref 8.9–10.3)
Chloride: 107 mmol/L (ref 98–111)
Creatinine, Ser: 0.87 mg/dL (ref 0.61–1.24)
GFR, Estimated: >60 mL/min (ref >60)
Glucose, Bld: 108 mg/dL — ABNORMAL HIGH (ref 70–99)
Potassium: 4.2 mmol/L (ref 3.5–5.1)
Sodium: 138 mmol/L (ref 135–145)
Total Bilirubin: 1.1 mg/dL (ref 0.3–1.2)
Total Protein: 5.1 g/dL — ABNORMAL LOW (ref 6.5–8.1)

## 2022-05-27 LAB — CBC
HCT: 37.9 % — ABNORMAL LOW (ref 39.0–52.0)
Hemoglobin: 12.8 g/dL — ABNORMAL LOW (ref 13.0–17.0)
MCH: 32.7 pg (ref 26.0–34.0)
MCHC: 33.8 g/dL (ref 30.0–36.0)
MCV: 96.7 fL (ref 80.0–100.0)
Platelets: 307 10*3/uL (ref 150–400)
RBC: 3.92 MIL/uL — ABNORMAL LOW (ref 4.22–5.81)
RDW: 12.6 % (ref 11.5–15.5)
WBC: 11.2 10*3/uL — ABNORMAL HIGH (ref 4.0–10.5)
nRBC: 0 % (ref 0.0–0.2)

## 2022-05-27 LAB — VITAMIN B12: Vitamin B-12: 393 pg/mL (ref 180–914)

## 2022-05-27 LAB — TSH: TSH: 2.66 u[IU]/mL (ref 0.350–4.500)

## 2022-05-27 LAB — FOLATE: Folate: 8.7 ng/mL (ref >5.9)

## 2022-05-27 MED ORDER — CYANOCOBALAMIN 1000 MCG/ML IJ SOLN
1000.0000 ug | Freq: Once | INTRAMUSCULAR | Status: AC
  Administered 2022-05-27: 1000 ug via SUBCUTANEOUS
  Filled 2022-05-27 (×2): qty 1

## 2022-05-27 NOTE — Care Management Important Message (Signed)
Important Message  Patient Details  Name: BURNELL HURTA MRN: [id number] Date of Birth: 1945-12-11   Medicare Important Message Given:  Yes     Hannah Beat 05/27/2022, 3:39 PM

## 2022-05-27 NOTE — Discharge Summary (Signed)
DISCHARGE SUMMARY  Adam Rowland  MR#: [id number]  DOB:[birth year]  Date of Admission: 05/20/2022 Date of Discharge: 05/27/2022  Attending Physician:Ceil Roderick Hennie Duos, MD  Patient's UMP:NTIRWE, [name], FNP  Consults: Orthopedics   Disposition: D/C to SNF for rehab stay   Follow-up Appts:  Follow-up Information     Swinteck, Aaron Edelman, MD. Schedule an appointment as soon as possible for a visit in 1 week(s).   Specialty: Orthopedic Surgery Contact information: [address] [place] [address] [phone]         Chevis Pretty, FNP Follow up in 1 week(s).   Specialty: Family Medicine Contact information: [place] [address] [phone]                 Tests Needing Follow-up: -assess BP and HR control - ACEi and BB stopped during this hospital stay due to relative hypotension   Discharge Diagnoses: Right acute impacted and angulated subcapital femoral neck fracture Chronic hypotension UTI POA Paroxysmal atrial fibrillation -chronic Dementia HLD  Initial presentation: 76 year old with a history of dementia, atrial fibrillation not on anticoagulation, HTN, HLD, and CAD who presented to the ER after an unwitnessed fall at his SNF with subsequent observed weightbearing difficulty.  Imaging in the ER confirmed a subcapital right femoral neck fracture.  Hospital Course:  Right acute impacted and angulated subcapital femoral neck fracture Status post ORIF 05/22/2022 -needs SNF rehab stay - f/u in [facility] office as per their recommendation - pain meds and DVT prophy prescribed by Ortho at time of d/c    Chronic hypotension Blood pressure stable th/o the hospital stay    UTI POA Diagnosed 9/18 and dosed with antibiotic as an outpatient -no evidence of persisting infection with normal UA this admission   Paroxysmal atrial fibrillation -chronic Rate controlled during this admission - no change in  medical tx    Dementia Lives in a memory care unit - reportedly severe with patient fully disoriented at baseline - pt alert and pleasant but confused at time of his d/c    HLD Continue statin w/o change     Allergies as of 05/27/2022   No Known Allergies      Medication List     STOP taking these medications    aspirin EC 81 MG tablet Replaced by: aspirin 81 MG chewable tablet   cephALEXin 500 MG capsule Commonly known as: KEFLEX   lisinopril 20 MG tablet Commonly known as: ZESTRIL   LORazepam 0.5 MG tablet Commonly known as: ATIVAN   metoprolol tartrate 25 MG tablet Commonly known as: LOPRESSOR   nitroGLYCERIN 0.4 MG SL tablet Commonly known as: NITROSTAT       TAKE these medications    aspirin 81 MG chewable tablet Commonly known as: Aspirin Childrens Chew 1 tablet (81 mg total) by mouth 2 (two) times daily with a meal. Replaces: aspirin EC 81 MG tablet   atorvastatin 40 MG tablet Commonly known as: LIPITOR TAKE 1 TABLET ONCE DAILY AT BEDTIME What changed:  how much to take how to take this when to take this additional instructions   donepezil 10 MG tablet Commonly known as: ARICEPT Take 1 tablet (10 mg total) by mouth at bedtime.   HYDROcodone-acetaminophen 10-325 MG tablet Commonly known as: Norco Take 0.5 tablets by mouth every 4 (four) hours as needed for up to 7 days for moderate pain or severe pain.   memantine 10 MG tablet Commonly known as: NAMENDA Take 10 mg by  mouth 2 (two) times daily.   polyethylene glycol powder 17 GM/SCOOP powder Commonly known as: MiraLax Take 17 g by mouth 2 (two) times daily as needed for moderate constipation.   senna-docusate 8.6-50 MG tablet Commonly known as: Senokot-S Take 1 tablet by mouth 2 (two) times daily between meals as needed for mild constipation.   Vitamin D3 50 MCG (2000 UT) Tabs Take 1 tablet by mouth daily.        Day of Discharge BP 106/69 (BP Location: Right Arm)   Pulse 99    Temp 97.6 F (36.4 C) (Oral)   Resp 17   Ht 5\' 9"  (1.753 m)   Wt 73 kg   SpO2 96%   BMI 23.77 kg/m   Physical Exam: General: No acute respiratory distress Lungs: Clear to auscultation bilaterally without wheezes or crackles Cardiovascular: Regular rate and rhythm without murmur gallop or rub normal S1 and S2 Abdomen: Nontender, nondistended, soft, bowel sounds positive, no rebound, no ascites, no appreciable mass Extremities: No significant cyanosis, clubbing, or edema bilateral lower extremities  Basic Metabolic Panel: Recent Labs  Lab 05/21/22 0050 05/23/22 0106 05/27/22 0253  NA 145 141 138  K 4.1 4.8 4.2  CL 115* 109 107  CO2 22 23 26   GLUCOSE 100* 183* 108*  BUN 28* 25* 13  CREATININE 0.89 0.96 0.87  CALCIUM 8.6* 8.4* 8.1*   CBC: Recent Labs  Lab 05/21/22 0050 05/22/22 0604 05/23/22 0106 05/27/22 0253  WBC 10.4 8.8 10.2 11.2*  NEUTROABS 7.1  --   --   --   HGB 14.1 12.0* 11.9* 12.8*  HCT 43.4 35.2* 34.5* 37.9*  MCV 102.1* 97.5 96.4 96.7  PLT 219 201 205 307    Time spent in discharge (includes decision making & examination of pt): 35 minutes  05/27/2022, 11:38 AM   05/29/22, MD [facility] Office  [phone]

## 2022-05-27 NOTE — TOC Transition Note (Signed)
Transition of Care [facility]) - CM/SW Discharge Note   Patient Details  Name: Adam Rowland MRN: [id number] Date of Birth: March 26, 1946  [facility] of [facility] CM/SW Contact:  Joanne Chars, LCSW Phone Number: 05/27/2022, 1:09 PM   Clinical Narrative:   Pt discharging to [facility].  RN call report to [phone].      Final next level of care: Skilled Nursing Facility Barriers to Discharge: Barriers Resolved   Patient Goals and CMS Choice   CMS Medicare.gov Compare Post Acute Care list provided to:: Patient Represenative (must comment) (wife) Choice offered to / list presented to : Spouse  Discharge Placement              Patient chooses bed at: [facility] Patient to be transferred to facility by: [place] Name of family member notified: wife [name] Patient and family notified of of transfer: 05/27/22  Discharge Plan and Services In-house Referral: Clinical Social Work   Post Acute Care Choice:  (TBD: memory care vs SNF)                               Social Determinants of Health (SDOH) Interventions Housing Interventions:  (Pt came from SNF)   Readmission Risk Interventions     No data to display

## 2022-06-01 DIAGNOSIS — E785 Hyperlipidemia, unspecified: Secondary | ICD-10-CM | POA: Diagnosis not present

## 2022-06-01 DIAGNOSIS — Z4789 Encounter for other orthopedic aftercare: Secondary | ICD-10-CM | POA: Diagnosis not present

## 2022-06-01 DIAGNOSIS — S72009S Fracture of unspecified part of neck of unspecified femur, sequela: Secondary | ICD-10-CM | POA: Diagnosis not present

## 2022-06-01 DIAGNOSIS — I10 Essential (primary) hypertension: Secondary | ICD-10-CM | POA: Diagnosis not present

## 2022-06-01 DIAGNOSIS — Z9181 History of falling: Secondary | ICD-10-CM | POA: Diagnosis not present

## 2022-06-01 DIAGNOSIS — I480 Paroxysmal atrial fibrillation: Secondary | ICD-10-CM | POA: Diagnosis not present

## 2022-06-01 DIAGNOSIS — I2510 Atherosclerotic heart disease of native coronary artery without angina pectoris: Secondary | ICD-10-CM | POA: Diagnosis not present

## 2022-06-06 DIAGNOSIS — S72001A Fracture of unspecified part of neck of right femur, initial encounter for closed fracture: Secondary | ICD-10-CM | POA: Diagnosis not present

## 2022-06-06 DIAGNOSIS — Z471 Aftercare following joint replacement surgery: Secondary | ICD-10-CM | POA: Diagnosis not present

## 2022-06-07 DIAGNOSIS — I480 Paroxysmal atrial fibrillation: Secondary | ICD-10-CM | POA: Diagnosis not present

## 2022-06-07 DIAGNOSIS — I2510 Atherosclerotic heart disease of native coronary artery without angina pectoris: Secondary | ICD-10-CM | POA: Diagnosis not present

## 2022-06-07 DIAGNOSIS — I10 Essential (primary) hypertension: Secondary | ICD-10-CM | POA: Diagnosis not present

## 2022-06-07 DIAGNOSIS — S72009S Fracture of unspecified part of neck of unspecified femur, sequela: Secondary | ICD-10-CM | POA: Diagnosis not present

## 2022-06-08 DIAGNOSIS — Z9181 History of falling: Secondary | ICD-10-CM | POA: Diagnosis not present

## 2022-06-08 DIAGNOSIS — Z4789 Encounter for other orthopedic aftercare: Secondary | ICD-10-CM | POA: Diagnosis not present

## 2022-06-08 DIAGNOSIS — I10 Essential (primary) hypertension: Secondary | ICD-10-CM | POA: Diagnosis not present

## 2022-06-08 DIAGNOSIS — E785 Hyperlipidemia, unspecified: Secondary | ICD-10-CM | POA: Diagnosis not present

## 2022-06-13 DIAGNOSIS — I10 Essential (primary) hypertension: Secondary | ICD-10-CM | POA: Diagnosis not present

## 2022-06-13 DIAGNOSIS — I480 Paroxysmal atrial fibrillation: Secondary | ICD-10-CM | POA: Diagnosis not present

## 2022-06-13 DIAGNOSIS — S72009S Fracture of unspecified part of neck of unspecified femur, sequela: Secondary | ICD-10-CM | POA: Diagnosis not present

## 2022-06-13 DIAGNOSIS — I2510 Atherosclerotic heart disease of native coronary artery without angina pectoris: Secondary | ICD-10-CM | POA: Diagnosis not present

## 2022-09-13 DIAGNOSIS — M6281 Muscle weakness (generalized): Secondary | ICD-10-CM | POA: Diagnosis not present

## 2022-09-13 DIAGNOSIS — S72009S Fracture of unspecified part of neck of unspecified femur, sequela: Secondary | ICD-10-CM | POA: Diagnosis not present

## 2022-10-02 IMAGING — CT CT HEAD CODE STROKE
3 of 4 series · 16 of 47 positions shown, 19 images · non-contrast
Comparison: Brain MRI 08/18/2017.

CLINICAL DATA: Code stroke. 75-year-old male with confusion and
weakness.

EXAM:
CT HEAD WITHOUT CONTRAST
TECHNIQUE: Contiguous axial images were obtained from the base of the skull
through the vertex without intravenous contrast.

[Series 2: head w o · axial · 0.49mm/px · z∈[-18,+117]mm · 10 of 33 slices shown, 13 images]
[im 3/33  brain]
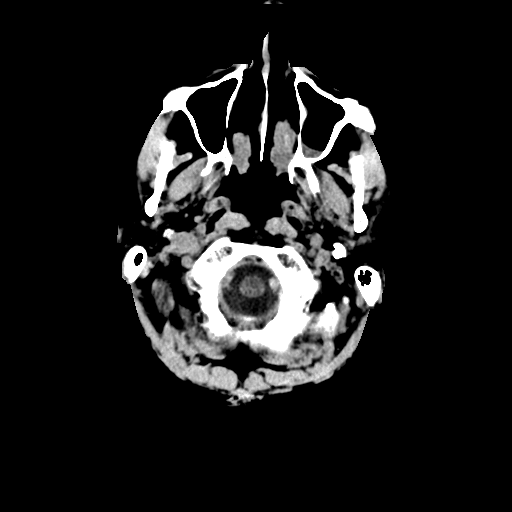
[im 3/33  bone]
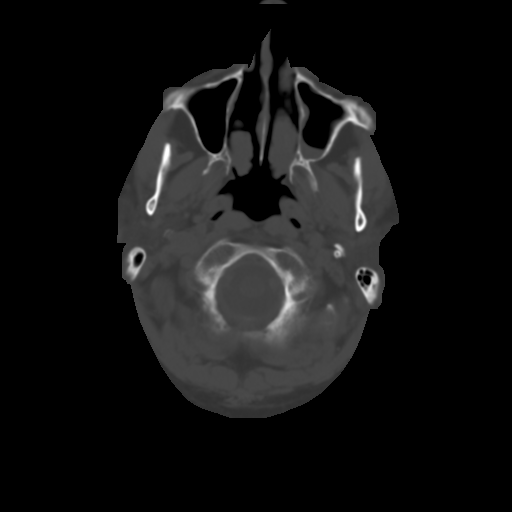
[im 5/33  brain]
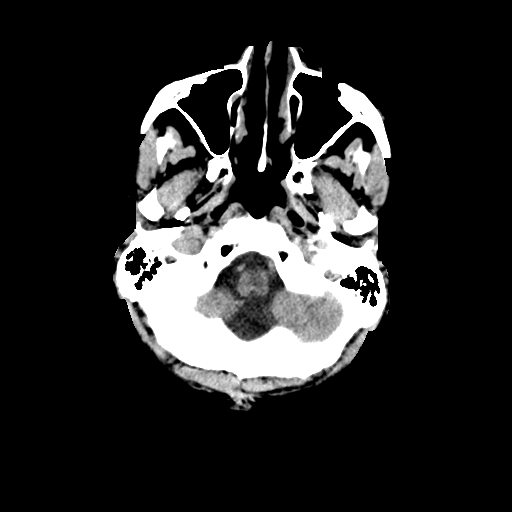
[im 10/33  brain]
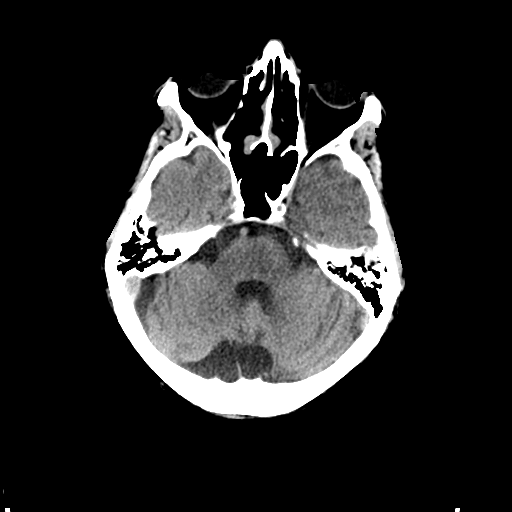
[im 12/33  brain]
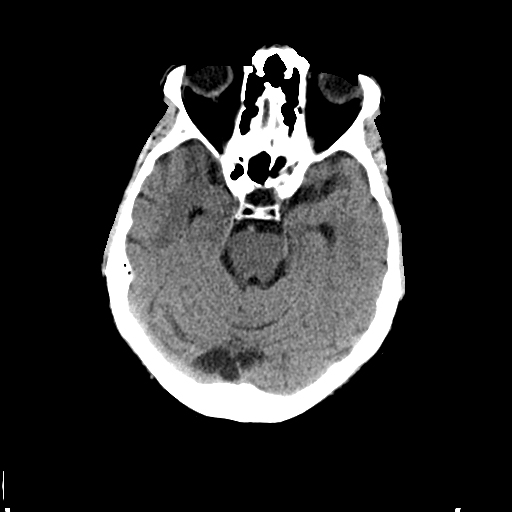
[im 14/33  brain]
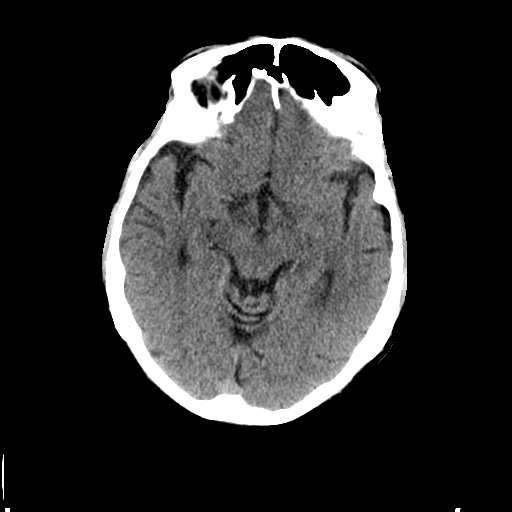
[im 14/33  bone]
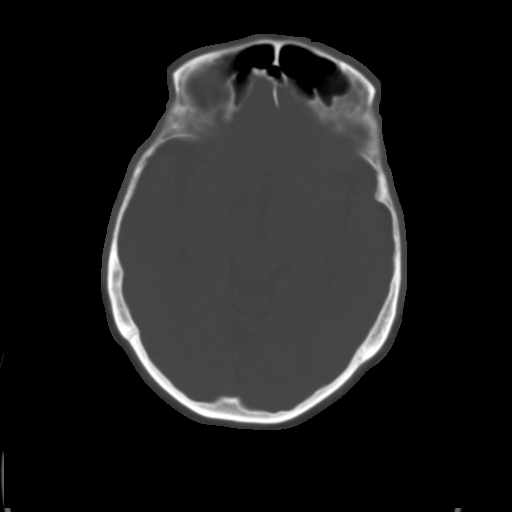
[im 19/33  brain]
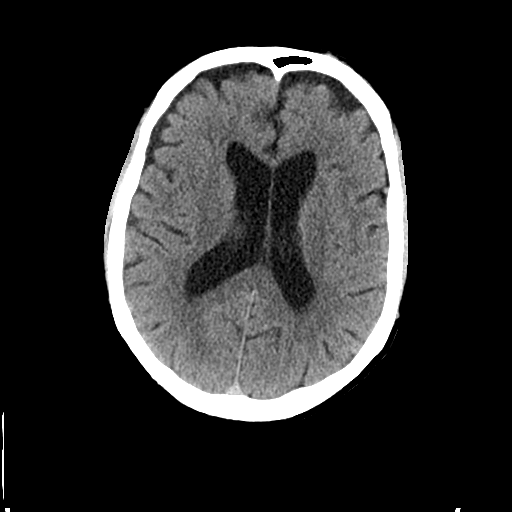
[im 21/33  brain]
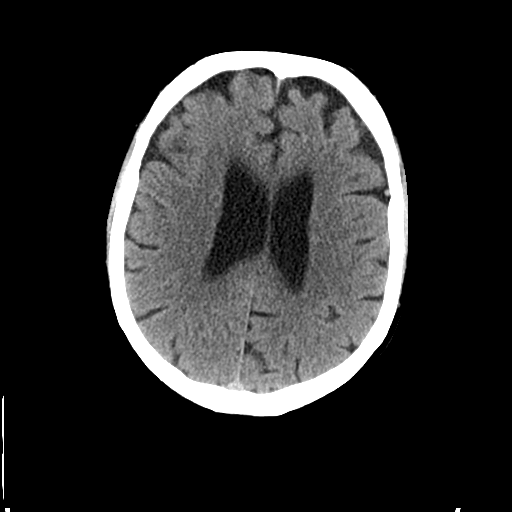
[im 23/33  brain]
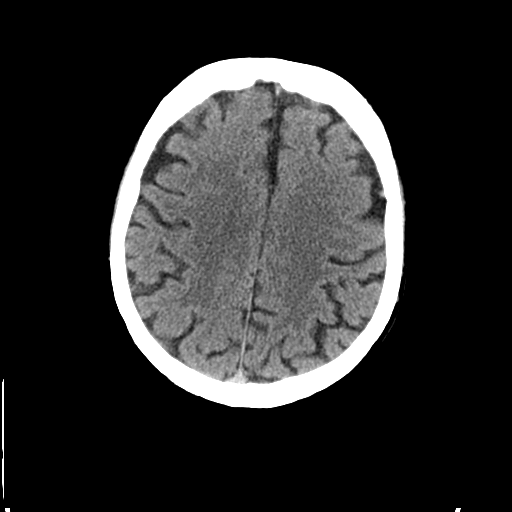
[im 28/33  brain]
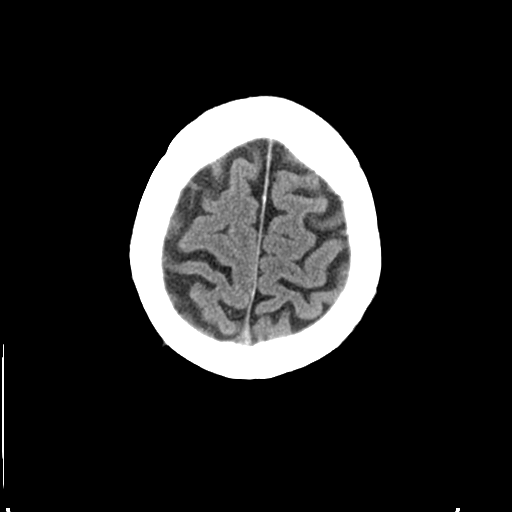
[im 28/33  bone]
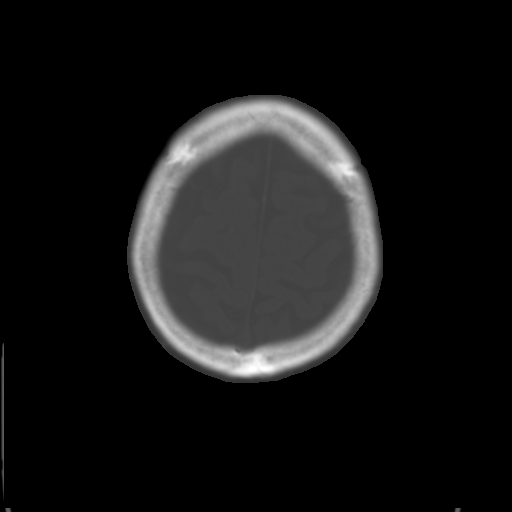
[im 30/33  brain]
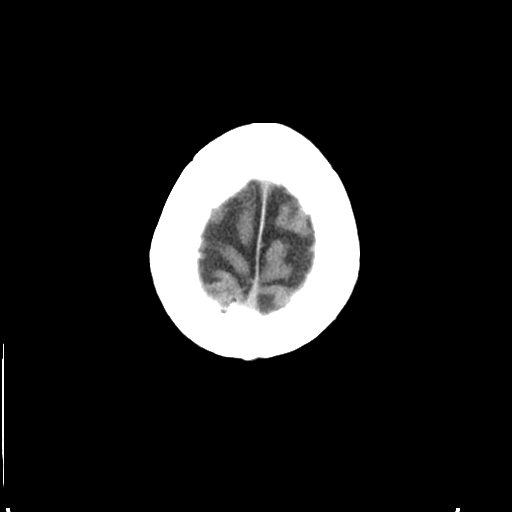

[Series 4: coronal soft · coronal · 0.36mm/px · 3 of 74 slices shown]
[im 25/74  brain]
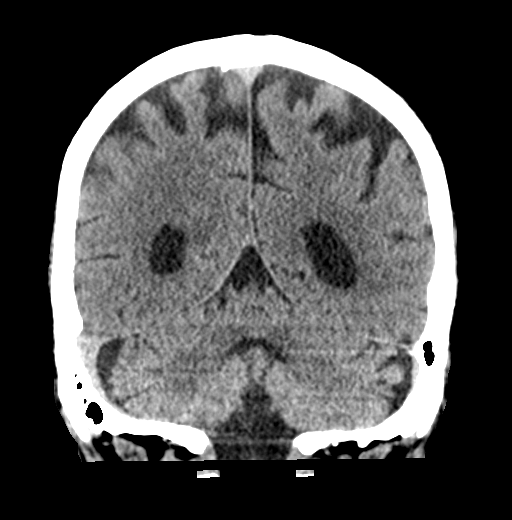
[im 33/74  brain]
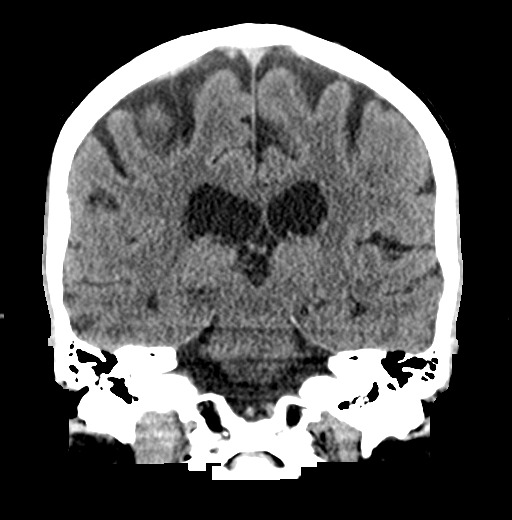
[im 41/74  brain]
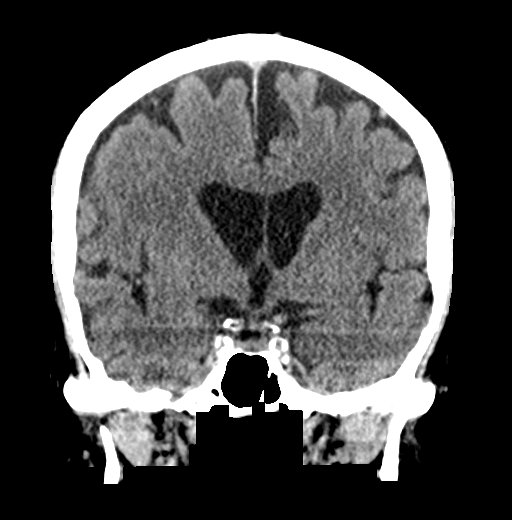

[Series 5: sagittal soft · sagittal · 0.40mm/px · 3 of 56 slices shown]
[im 19/56  brain]
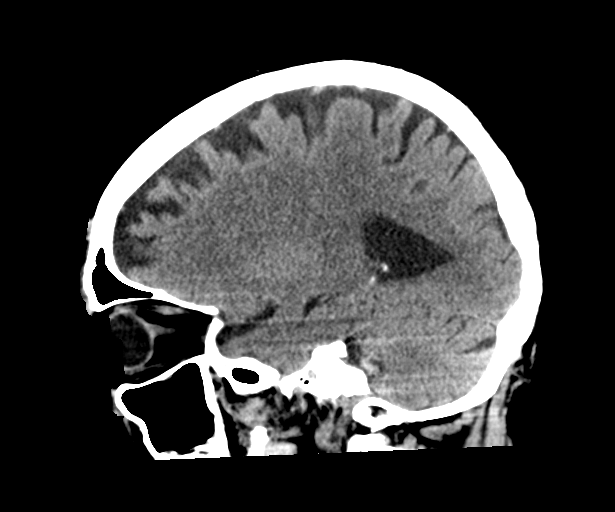
[im 28/56  brain]
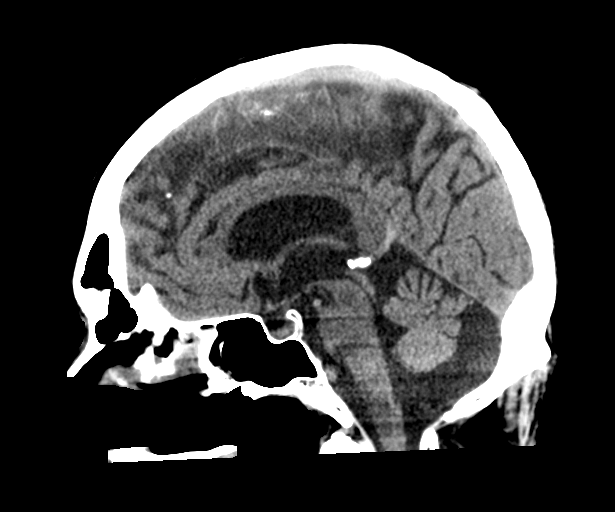
[im 37/56  brain]
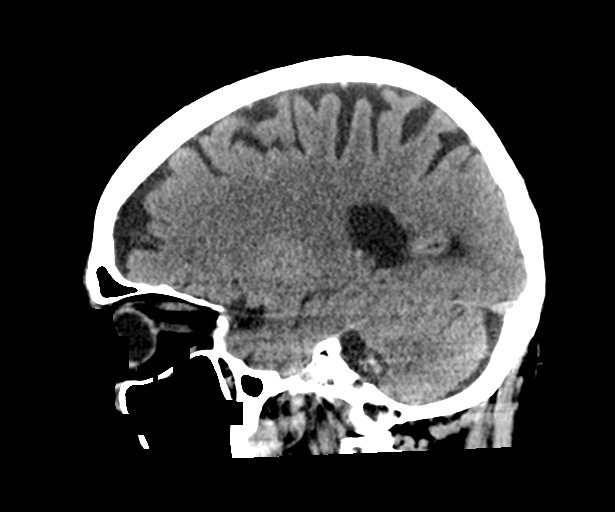

[16 of 47 positions shown; findings below may reference images not displayed]

FINDINGS: Brain: Cerebral volume is stable since 7336 and within normal limits
for age. No midline shift, ventriculomegaly, mass effect, evidence
of mass lesion, intracranial hemorrhage or evidence of cortically
based acute infarction. Gray-white matter differentiation is within
normal limits throughout the brain. No cortical encephalomalacia
identified.

Vascular: Calcified atherosclerosis at the skull base. No suspicious
intracranial vascular hyperdensity.

Skull: No acute osseous abnormality identified.

Sinuses/Orbits: Previous paranasal sinus surgery and chronic
bilateral sinus disease appears stable since 7336. Tympanic cavities
and mastoids are clear.

Other: No acute orbit or scalp soft tissue finding.

ASPECTS (Alberta Stroke Program Early CT Score)

Total score (0-10 with 10 being normal): 10
IMPRESSION: 1. Normal for age non contrast CT appearance of the brain.
2. This was discussed by telephone with Dr. Darina in the ED on
3. Chronic paranasal sinus disease, prior sinus surgery.

## 2022-10-02 IMAGING — DX DG CHEST 1V PORT
1 series · 1 of 1 positions shown · non-contrast
Comparison: None.

CLINICAL DATA: Confusion, weakness

EXAM:
PORTABLE CHEST 1 VIEW

[chest ap]
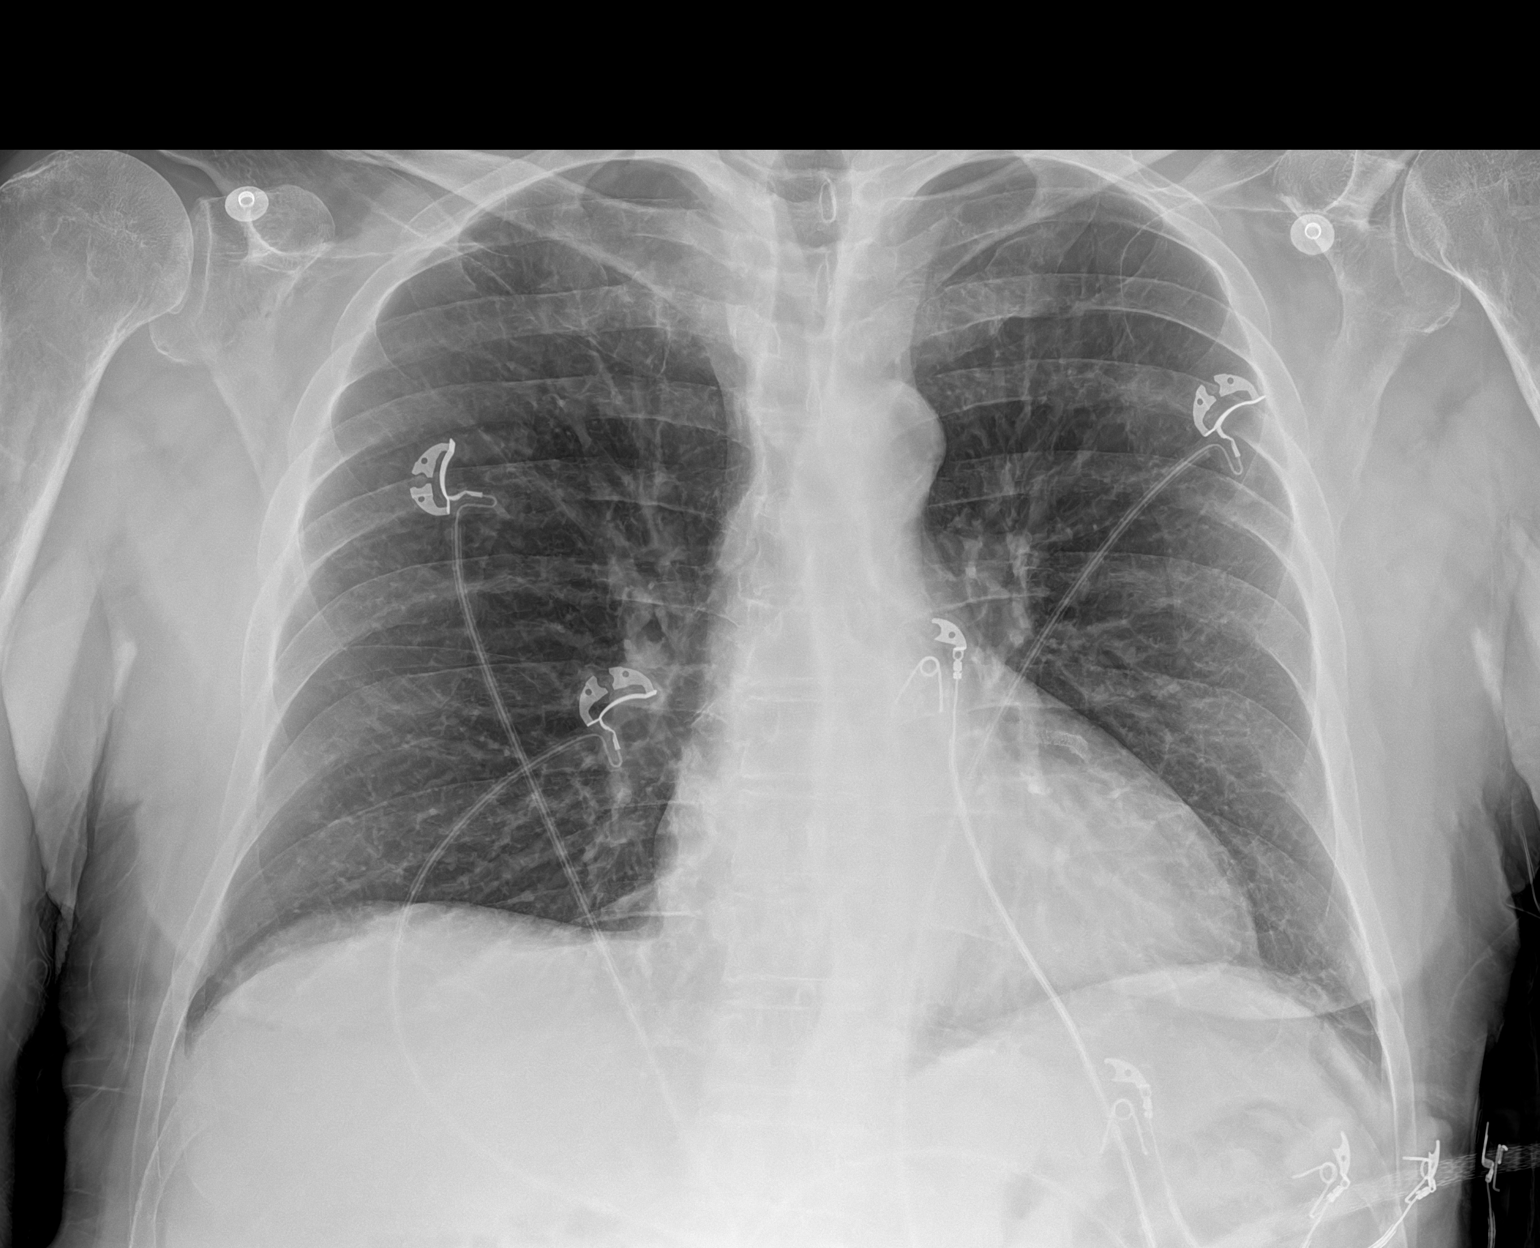

[1 of 1 positions shown; findings below may reference images not displayed]

FINDINGS: The heart size and mediastinal contours are within normal limits.
Both lungs are clear. Stent projects over the left heart. The
visualized skeletal structures are unremarkable.
IMPRESSION: No active disease.

## 2022-10-05 DIAGNOSIS — B351 Tinea unguium: Secondary | ICD-10-CM | POA: Diagnosis not present

## 2022-10-14 DIAGNOSIS — S72009S Fracture of unspecified part of neck of unspecified femur, sequela: Secondary | ICD-10-CM | POA: Diagnosis not present

## 2022-10-14 DIAGNOSIS — M6281 Muscle weakness (generalized): Secondary | ICD-10-CM | POA: Diagnosis not present

## 2022-11-12 DIAGNOSIS — S72009S Fracture of unspecified part of neck of unspecified femur, sequela: Secondary | ICD-10-CM | POA: Diagnosis not present

## 2022-11-12 DIAGNOSIS — M6281 Muscle weakness (generalized): Secondary | ICD-10-CM | POA: Diagnosis not present

## 2022-12-28 DEATH — deceased
